# Patient Record
Sex: Female | Born: 1960 | Race: White | Hispanic: No | Marital: Married | Smoking: Current every day smoker
Health system: Southern US, Community
[De-identification: ages and names within clinical notes are randomized; demographics above are authoritative.]

## PROBLEM LIST (undated history)

## (undated) DIAGNOSIS — Z720 Tobacco use: Secondary | ICD-10-CM

## (undated) DIAGNOSIS — I2510 Atherosclerotic heart disease of native coronary artery without angina pectoris: Secondary | ICD-10-CM

## (undated) DIAGNOSIS — J45909 Unspecified asthma, uncomplicated: Secondary | ICD-10-CM

## (undated) DIAGNOSIS — F329 Major depressive disorder, single episode, unspecified: Secondary | ICD-10-CM

## (undated) DIAGNOSIS — J309 Allergic rhinitis, unspecified: Secondary | ICD-10-CM

## (undated) DIAGNOSIS — T7840XA Allergy, unspecified, initial encounter: Secondary | ICD-10-CM

## (undated) DIAGNOSIS — IMO0002 Reserved for concepts with insufficient information to code with codable children: Secondary | ICD-10-CM

## (undated) DIAGNOSIS — Z780 Asymptomatic menopausal state: Secondary | ICD-10-CM

## (undated) DIAGNOSIS — K589 Irritable bowel syndrome without diarrhea: Secondary | ICD-10-CM

## (undated) DIAGNOSIS — Z8719 Personal history of other diseases of the digestive system: Secondary | ICD-10-CM

## (undated) DIAGNOSIS — K219 Gastro-esophageal reflux disease without esophagitis: Secondary | ICD-10-CM

## (undated) DIAGNOSIS — J42 Unspecified chronic bronchitis: Secondary | ICD-10-CM

## (undated) DIAGNOSIS — F32A Depression, unspecified: Secondary | ICD-10-CM

## (undated) DIAGNOSIS — L409 Psoriasis, unspecified: Secondary | ICD-10-CM

## (undated) DIAGNOSIS — N809 Endometriosis, unspecified: Secondary | ICD-10-CM

## (undated) DIAGNOSIS — E785 Hyperlipidemia, unspecified: Secondary | ICD-10-CM

## (undated) DIAGNOSIS — F419 Anxiety disorder, unspecified: Secondary | ICD-10-CM

## (undated) DIAGNOSIS — Z8669 Personal history of other diseases of the nervous system and sense organs: Secondary | ICD-10-CM

## (undated) DIAGNOSIS — I10 Essential (primary) hypertension: Secondary | ICD-10-CM

## (undated) HISTORY — DX: Allergy, unspecified, initial encounter: T78.40XA

## (undated) HISTORY — DX: Depression, unspecified: F32.A

## (undated) HISTORY — DX: Essential (primary) hypertension: I10

## (undated) HISTORY — DX: Gastro-esophageal reflux disease without esophagitis: K21.9

## (undated) HISTORY — DX: Anxiety disorder, unspecified: F41.9

## (undated) HISTORY — DX: Personal history of other diseases of the nervous system and sense organs: Z86.69

## (undated) HISTORY — DX: Hyperlipidemia, unspecified: E78.5

## (undated) HISTORY — DX: Tobacco use: Z72.0

## (undated) HISTORY — DX: Major depressive disorder, single episode, unspecified: F32.9

## (undated) HISTORY — DX: Endometriosis, unspecified: N80.9

## (undated) HISTORY — DX: Atherosclerotic heart disease of native coronary artery without angina pectoris: I25.10

## (undated) HISTORY — DX: Allergic rhinitis, unspecified: J30.9

## (undated) HISTORY — DX: Unspecified asthma, uncomplicated: J45.909

## (undated) HISTORY — DX: Psoriasis, unspecified: L40.9

## (undated) HISTORY — DX: Irritable bowel syndrome, unspecified: K58.9

## (undated) HISTORY — DX: Reserved for concepts with insufficient information to code with codable children: IMO0002

## (undated) HISTORY — DX: Personal history of other diseases of the digestive system: Z87.19

## (undated) HISTORY — PX: DILATION AND CURETTAGE OF UTERUS: SHX78

---

## 2001-07-15 HISTORY — PX: ABDOMINAL HYSTERECTOMY: SHX81

## 2004-08-07 ENCOUNTER — Ambulatory Visit: Payer: Self-pay | Admitting: Obstetrics and Gynecology

## 2004-12-17 ENCOUNTER — Ambulatory Visit: Payer: Self-pay | Admitting: Internal Medicine

## 2005-09-05 ENCOUNTER — Ambulatory Visit: Payer: Self-pay | Admitting: Obstetrics and Gynecology

## 2007-11-19 ENCOUNTER — Ambulatory Visit: Payer: Self-pay | Admitting: Family Medicine

## 2007-12-31 ENCOUNTER — Ambulatory Visit: Payer: Self-pay | Admitting: Internal Medicine

## 2008-04-15 ENCOUNTER — Ambulatory Visit: Payer: Self-pay | Admitting: Internal Medicine

## 2009-03-05 ENCOUNTER — Emergency Department: Payer: Self-pay | Admitting: Emergency Medicine

## 2010-04-03 ENCOUNTER — Ambulatory Visit: Payer: Self-pay

## 2010-04-30 ENCOUNTER — Other Ambulatory Visit: Payer: Self-pay | Admitting: Surgery

## 2010-07-04 ENCOUNTER — Ambulatory Visit: Payer: Self-pay | Admitting: Gastroenterology

## 2010-07-10 LAB — PATHOLOGY REPORT

## 2010-08-04 LAB — HM PAP SMEAR

## 2010-12-05 LAB — PULMONARY FUNCTION TEST

## 2011-03-05 LAB — HM COLONOSCOPY: HM Colonoscopy: NORMAL

## 2011-03-10 LAB — HM COLONOSCOPY

## 2011-07-02 ENCOUNTER — Ambulatory Visit: Payer: Self-pay | Admitting: Otolaryngology

## 2012-01-03 LAB — HM MAMMOGRAPHY: HM Mammogram: NORMAL

## 2012-01-27 ENCOUNTER — Ambulatory Visit: Payer: Self-pay | Admitting: Obstetrics and Gynecology

## 2012-01-29 ENCOUNTER — Ambulatory Visit: Payer: Self-pay | Admitting: Obstetrics and Gynecology

## 2012-03-04 ENCOUNTER — Encounter: Payer: Self-pay | Admitting: Internal Medicine

## 2012-03-04 ENCOUNTER — Ambulatory Visit (INDEPENDENT_AMBULATORY_CARE_PROVIDER_SITE_OTHER): Payer: PRIVATE HEALTH INSURANCE | Admitting: Internal Medicine

## 2012-03-04 VITALS — BP 110/70 | HR 106 | Temp 98.7°F | Resp 16 | Ht 67.0 in | Wt 187.5 lb

## 2012-03-04 DIAGNOSIS — Z8719 Personal history of other diseases of the digestive system: Secondary | ICD-10-CM

## 2012-03-04 DIAGNOSIS — F3289 Other specified depressive episodes: Secondary | ICD-10-CM

## 2012-03-04 DIAGNOSIS — F32A Depression, unspecified: Secondary | ICD-10-CM

## 2012-03-04 DIAGNOSIS — Z7189 Other specified counseling: Secondary | ICD-10-CM

## 2012-03-04 DIAGNOSIS — Z720 Tobacco use: Secondary | ICD-10-CM | POA: Insufficient documentation

## 2012-03-04 DIAGNOSIS — IMO0002 Reserved for concepts with insufficient information to code with codable children: Secondary | ICD-10-CM

## 2012-03-04 DIAGNOSIS — Z716 Tobacco abuse counseling: Secondary | ICD-10-CM

## 2012-03-04 DIAGNOSIS — K589 Irritable bowel syndrome without diarrhea: Secondary | ICD-10-CM

## 2012-03-04 DIAGNOSIS — F329 Major depressive disorder, single episode, unspecified: Secondary | ICD-10-CM

## 2012-03-04 DIAGNOSIS — I10 Essential (primary) hypertension: Secondary | ICD-10-CM

## 2012-03-04 DIAGNOSIS — F17200 Nicotine dependence, unspecified, uncomplicated: Secondary | ICD-10-CM

## 2012-03-04 DIAGNOSIS — N390 Urinary tract infection, site not specified: Secondary | ICD-10-CM

## 2012-03-04 LAB — POCT URINALYSIS DIPSTICK
Bilirubin, UA: NEGATIVE
Blood, UA: NEGATIVE
Glucose, UA: NEGATIVE
Ketones, UA: NEGATIVE
Leukocytes, UA: NEGATIVE
Nitrite, UA: NEGATIVE
Protein, UA: NEGATIVE
Spec Grav, UA: 1.01
Urobilinogen, UA: 0.2
pH, UA: 5.5

## 2012-03-04 MED ORDER — ESOMEPRAZOLE MAGNESIUM 40 MG PO CPDR: 40.0000 mg | DELAYED_RELEASE_CAPSULE | Freq: Every day | ORAL | Status: DC

## 2012-03-04 NOTE — Progress Notes (Signed)
Patient ID: Colleen Washington, female   DOB: 1960-11-19, 51 y.o.   MRN: [id number]  Patient Active Problem List  Diagnosis  . Tobacco abuse counseling  . Depression  . History of diverticulitis of colon  . GERD (gastroesophageal reflux disease)  . Hyperlipidemia  . Irritable bowel syndrome  . H/O migraine  . Hypertension  . Herniated intervertebral disc    Subjective:  CC:   Chief Complaint  Patient presents with  . Establish Care    HPI:   Colleen Washington is a 51 y.o. female who presents as a new patient to establish primary care with the chief complaint of  1) diarrhea.  She has a history of IBS and anxiety .  Fluctuates between diarrhea and constipation.  Currently having a bout of diarrhea , which she describes as multiple loose stools daily. There have been no dietary changes, although she did have [employer] roast beef sandwhich last night.  Drinks [employer] daily.  Uses immodium on a regular basis if she is in close proximity to the bathroom. She  Has not tried a gluten free diet, but has noticed improvement when she diets and avoids fatty foods and.  Takes wellbutrin,  both for smoking cessation and for depression.  Prior trials of SSRIs caused loss of libido . recently resumed  Wellbutrin a month ago prescribed by Dr. Greggory Keen who resumed it for possible perimenopausal symptoms (hot flashes).   2) Back and flank pain.  She had a recent vaginal ultrasound for right sided pain, bladder pressure and low back pain .  Was treated initially for a UTI, but developed signs and symptoms consistent with sciatica.[name]  3) inability to lose weight  .  Does not exercise regularly.  Drinks SlimFast with added fruit in AM,  Lunch is banana sandwich. She craves carbohydrates;.drinks sugared sodas, averaging 2 cans daily. Her Brother is a diabetic . 3) reflux for more than 5 yrs,  Prior screening for Barretts with EGD 2012 with colonoscopy.  Her reflux is  aggravated by flavored waters.   4) elevated cholesterol,   Total is over 200 but she is not sure the breakdown. She did not tolerate statins due to bone pain.   A  Past Medical History  Diagnosis Date  . allergic rhinitis   . Depression   . History of diverticulitis of colon   . GERD (gastroesophageal reflux disease)   . Hyperlipidemia   . Irritable bowel syndrome   . H/O migraine   . Hypertension   . Herniated intervertebral disc     Past Surgical History  Procedure Date  . Abdominal hysterectomy 2003    Family History  Problem Relation Age of Onset  . Arthritis Mother   . Hyperlipidemia Mother   . Cancer Father     colon  . Arthritis Maternal Grandmother   . Hyperlipidemia Maternal Grandfather     History   Social History  . Marital Status: Married    Spouse Name: N/A    Number of Children: N/A  . Years of Education: N/A   Occupational History  . Not on file.   Social History Main Topics  . Smoking status: Current Everyday Smoker    Types: Cigarettes  . Smokeless tobacco: Never Used  . Alcohol Use: Yes  . Drug Use: No  . Sexually Active: Not on file   Other Topics Concern  . Not on file   Social History Narrative  . No narrative on file  Allergies  Allergen Reactions  . Compazine (Prochlorperazine) Swelling    Review of Systems:   The remainder of the review of systems was negative except those addressed in the HPI.    Objective:  BP 110/70  Pulse 106  Temp 98.7 F (37.1 C) (Oral)  Resp 16  Ht 5\' 7"  (1.702 m)  Wt 187 lb 8 oz (85.049 kg)  BMI 29.37 kg/m2  SpO2 95%  General appearance: alert, cooperative and appears stated age Ears: normal TM's and external ear canals both ears Throat: lips, mucosa, and tongue normal; teeth and gums normal Neck: no adenopathy, no carotid bruit, supple, symmetrical, trachea midline and thyroid not enlarged, symmetric, no tenderness/mass/nodules Back: symmetric, no curvature. ROM normal. No CVA tenderness. Lungs: clear to auscultation  bilaterally Heart: regular rate and rhythm, S1, S2 normal, no murmur, click, rub or gallop Abdomen: soft, non-tender; bowel sounds normal; no masses,  no organomegaly Pulses: 2+ and symmetric Skin: Skin color, texture, turgor normal. No rashes or lesions Lymph nodes: Cervical, supraclavicular, and axillary nodes normal.  Assessment and Plan:  Herniated intervertebral disc Recommended core strengthening exercises and regular exercise to reduce her weight. She has no radicular symptoms currently.  Hypertension She has a history of hypertension by prior PCPs records but does not appear to having any issues currently.  Irritable bowel syndrome Recommended low carbohydrate diet and possibly trial of gluten-free diet to see if her symptoms improve.  Tobacco abuse counseling She has resumed Wellbutrin as of one month ago for assistance in quitting smoking. I encouraged her to reduce her consumption by one cigarette weekly.  History of diverticulitis of colon Recommend high-fiber diet.  Depression She's currently taking Wellbutrin for multiple reasons. She has no symptoms of anhedonia today.   Updated Medication List Outpatient Encounter Prescriptions as of 03/04/2012  Medication Sig Dispense Refill  . buPROPion (WELLBUTRIN SR) 150 MG 12 hr tablet Take 150 mg by mouth daily.      [name] esomeprazole (NEXIUM) 40 MG capsule Take 1 capsule (40 mg total) by mouth daily.  30 capsule  3     Orders Placed This Encounter  Procedures  . HM MAMMOGRAPHY  . HM PAP SMEAR  . POCT Urinalysis Dipstick  . HM COLONOSCOPY    No Follow-up on file.

## 2012-03-04 NOTE — Patient Instructions (Addendum)
Consider a Low Glycemic Index Diet and eating 6 smaller meals daily .  This frequent feeding stimulates your metabolism and the lower glycemic index foods will lower your blood sugars:   This is an example of my daily  "Low GI"  Diet:  All of the foods can be found at grocery stores and in bulk at [employer]  club   7 AM Breakfast:  Low carbohydrate Protein  Shakes (I recommend the [employer] "Carb Control" shakes  Or the low carb shakes by Atkins.   Both are available everywhere:  In  cases at [employer]  Or in 4 packs at grocery stores and pharmacies  2.5 carbs  (Alternative is  a toasted Arnold's Sandwhich Thin w/ peanut butter, a "Bagel Thin" with cream cheese and salmon) or  a scrambled egg burrito made with a low carb tortilla .  Avoid cereal and bananas, oatmeal too unless the old fashioned kind that takes 30-40 minutes to prepare.  the rest is overly processed, has minimal fiber, and loaded with carbohydrates!   10 AM: Protein bar by Atkins (the snack size, under 200 cal.  There are many varieties , available widely again or in bulk in limited varieties at [employer])  Other so called "protein bars" tend to be loaded with carbohydrates.  Remember, in food advertising, the word "energy" is synonymous for " carbohydrate."  Lunch: sandwich of turkey, (or any lunchmeat or canned tuna), fresh avocado and cheese on a lower carbohydrate pita bread, flatbread, or tortilla . Ok to use mayonnaise. The bread is the only source or carbohydrate that can be decreased (Joseph's makes a pita bread and a flat bread  Are 50 cal and 4 net carbs ; Toufayan makes a low carb flatbread 100 cal and 9 net carbs  and  Mission makes a low carb whole wheat tortilla  210 cal and 6 net carbs)  3 PM:  Mid day :  Another protein bar,  Or a  cheese stick (100 cal, 0 carbs),  Or 1 ounce of  almonds, walnuts, pistachios, pecans, peanuts,  Macadamia nuts. Or a Dannon light n Fit greek yogurt, 80 cal 8 net carbs . Avoid "granola"; the dried  cranberries and raisins are loaded with carbohydrates.    6 PM  Dinner:  "mean and green:"  Meat/chicken/fish or a high protein legume; , with a green salad, and a low GI  Veggie (broccoli, cauliflower, green beans, spinach, brussel sprouts. Lima beans) : Avoid "Low fat dressings, [place] and [place]! They are loaded with sugar! Instead use ranch, vinagrette,  Blue cheese, etc  9 PM snack : Breyer's "low carb" fudgsicle or  ice cream bar (Carb Smart line), or  Weight Watcher's ice cream bar , or anouther "no sugar added" ice cream; or another protein shake or a serving of fresh fruit with whipped cream (Avoid bananas, pineapple, grapes  and watermelon on a regular basis because they are high in sugar)   Remember that snack Substitutions should be less than 15 to 20 carbs  Per serving. Remember to subtract fiber grams to get the "net carbs." 

## 2012-03-05 ENCOUNTER — Telehealth: Payer: Self-pay | Admitting: Internal Medicine

## 2012-03-05 NOTE — Telephone Encounter (Signed)
I received the request from the pharmacy for a prior auth for Nexium.  Form has been requested and will be faxed.

## 2012-03-07 ENCOUNTER — Encounter: Payer: Self-pay | Admitting: Internal Medicine

## 2012-03-07 DIAGNOSIS — Z8719 Personal history of other diseases of the digestive system: Secondary | ICD-10-CM | POA: Insufficient documentation

## 2012-03-07 DIAGNOSIS — F32A Depression, unspecified: Secondary | ICD-10-CM | POA: Insufficient documentation

## 2012-03-07 DIAGNOSIS — IMO0002 Reserved for concepts with insufficient information to code with codable children: Secondary | ICD-10-CM | POA: Insufficient documentation

## 2012-03-07 DIAGNOSIS — I10 Essential (primary) hypertension: Secondary | ICD-10-CM | POA: Insufficient documentation

## 2012-03-07 DIAGNOSIS — K2100 Gastro-esophageal reflux disease with esophagitis, without bleeding: Secondary | ICD-10-CM | POA: Insufficient documentation

## 2012-03-07 DIAGNOSIS — E785 Hyperlipidemia, unspecified: Secondary | ICD-10-CM | POA: Insufficient documentation

## 2012-03-07 DIAGNOSIS — K589 Irritable bowel syndrome without diarrhea: Secondary | ICD-10-CM | POA: Insufficient documentation

## 2012-03-07 DIAGNOSIS — Z8669 Personal history of other diseases of the nervous system and sense organs: Secondary | ICD-10-CM | POA: Insufficient documentation

## 2012-03-07 DIAGNOSIS — K219 Gastro-esophageal reflux disease without esophagitis: Secondary | ICD-10-CM | POA: Insufficient documentation

## 2012-03-07 DIAGNOSIS — F329 Major depressive disorder, single episode, unspecified: Secondary | ICD-10-CM | POA: Insufficient documentation

## 2012-03-07 NOTE — Assessment & Plan Note (Addendum)
She's currently taking Wellbutrin for multiple reasons. She has no symptoms of anhedonia today.

## 2012-03-07 NOTE — Assessment & Plan Note (Signed)
Recommended core strengthening exercises and regular exercise to reduce her weight. She has no radicular symptoms currently.

## 2012-03-07 NOTE — Assessment & Plan Note (Signed)
Recommend high fiber diet

## 2012-03-07 NOTE — Assessment & Plan Note (Signed)
She has a history of hypertension by prior PCPs records but does not appear to having any issues currently.

## 2012-03-07 NOTE — Assessment & Plan Note (Signed)
She has resumed Wellbutrin as of one month ago for assistance in quitting smoking. I encouraged her to reduce her consumption by one cigarette weekly.

## 2012-03-07 NOTE — Assessment & Plan Note (Signed)
Recommended low carbohydrate diet and possibly trial of gluten-free diet to see if her symptoms improve.

## 2012-05-06 ENCOUNTER — Ambulatory Visit (INDEPENDENT_AMBULATORY_CARE_PROVIDER_SITE_OTHER): Payer: PRIVATE HEALTH INSURANCE | Admitting: Internal Medicine

## 2012-05-06 ENCOUNTER — Encounter: Payer: Self-pay | Admitting: Internal Medicine

## 2012-05-06 VITALS — BP 118/70 | HR 95 | Temp 97.7°F | Ht 67.0 in | Wt 184.0 lb

## 2012-05-06 DIAGNOSIS — Z7189 Other specified counseling: Secondary | ICD-10-CM

## 2012-05-06 DIAGNOSIS — J42 Unspecified chronic bronchitis: Secondary | ICD-10-CM

## 2012-05-06 DIAGNOSIS — R197 Diarrhea, unspecified: Secondary | ICD-10-CM

## 2012-05-06 DIAGNOSIS — F17200 Nicotine dependence, unspecified, uncomplicated: Secondary | ICD-10-CM

## 2012-05-06 DIAGNOSIS — Z716 Tobacco abuse counseling: Secondary | ICD-10-CM

## 2012-05-06 DIAGNOSIS — R350 Frequency of micturition: Secondary | ICD-10-CM | POA: Insufficient documentation

## 2012-05-06 LAB — POCT URINALYSIS DIPSTICK
Bilirubin, UA: NEGATIVE
Blood, UA: NEGATIVE
Glucose, UA: NEGATIVE
Ketones, UA: NEGATIVE
Leukocytes, UA: NEGATIVE
Nitrite, UA: NEGATIVE
Protein, UA: NEGATIVE
Spec Grav, UA: 1.015
Urobilinogen, UA: 0.2
pH, UA: 7

## 2012-05-06 MED ORDER — ONDANSETRON HCL 4 MG PO TABS: 4.0000 mg | ORAL_TABLET | Freq: Three times a day (TID) | ORAL | Status: DC | PRN

## 2012-05-06 MED ORDER — SOLIFENACIN SUCCINATE 5 MG PO TABS: 5.0000 mg | ORAL_TABLET | Freq: Every day | ORAL | Status: DC

## 2012-05-06 NOTE — Patient Instructions (Addendum)
Diet for Diarrhea, Adult Having frequent, runny stools (diarrhea) has many causes. Diarrhea may be caused or worsened by food or drink. Diarrhea may be relieved by changing your diet. IF YOU ARE NOT TOLERATING SOLID FOODS:  Drink enough water and fluids to keep your urine clear or pale yellow.  Avoid sugary drinks and sodas as well as milk-based beverages.  Avoid beverages containing caffeine and alcohol.  You may try rehydrating beverages. You can make your own by following this recipe:   tsp table salt.   tsp baking soda.   tsp salt substitute (potassium chloride).  1 tbs + 1 tsp sugar.  1 qt water. As your stools become more solid, you can start eating solid foods. Add foods one at a time. If a certain food causes your diarrhea to get worse, avoid that food and try other foods. A low fiber, low-fat, and lactose-free diet is recommended. Small, frequent meals may be better tolerated.  Starches  Allowed:  White, French, and pita breads, plain rolls, buns, bagels. Plain muffins, matzo. Soda, saltine, or graham crackers. Pretzels, melba toast, zwieback. Cooked cereals made with water: cornmeal, farina, cream cereals. Dry cereals: refined corn, wheat, rice. Potatoes prepared any way without skins, refined macaroni, spaghetti, noodles, refined rice.  Avoid:  Bread, rolls, or crackers made with whole wheat, multi-grains, rye, bran seeds, nuts, or coconut. Corn tortillas or taco shells. Cereals containing whole grains, multi-grains, bran, coconut, nuts, or raisins. Cooked or dry oatmeal. Coarse wheat cereals, granola. Cereals advertised as "high-fiber." Potato skins. Whole grain pasta, wild or brown rice. Popcorn. Sweet potatoes/yams. Sweet rolls, doughnuts, waffles, pancakes, sweet breads. Vegetables  Allowed: Strained tomato and vegetable juices. Most well-cooked and canned vegetables without seeds. Fresh: Tender lettuce, cucumber without the skin, cabbage, spinach, bean  sprouts.  Avoid: Fresh, cooked, or canned: Artichokes, baked beans, beet greens, broccoli, Brussels sprouts, corn, kale, legumes, peas, sweet potatoes. Cooked: Green or red cabbage, spinach. Avoid large servings of any vegetables, because vegetables shrink when cooked, and they contain more fiber per serving than fresh vegetables. Fruit  Allowed: All fruit juices except prune juice. Cooked or canned: Apricots, applesauce, cantaloupe, cherries, fruit cocktail, grapefruit, grapes, kiwi, mandarin oranges, peaches, pears, plums, watermelon. Fresh: Apples without skin, ripe banana, grapes, cantaloupe, cherries, grapefruit, peaches, oranges, plums. Keep servings limited to  cup or 1 piece.  Avoid: Fresh: Apple with skin, apricots, mango, pears, raspberries, strawberries. Prune juice, stewed or dried prunes. Dried fruits, raisins, dates. Large servings of all fresh fruits. Meat and Meat Substitutes  Allowed: Ground or well-cooked tender beef, ham, veal, lamb, pork, or poultry. Eggs, plain cheese. Fish, oysters, shrimp, lobster, other seafoods. Liver, organ meats.  Avoid: Tough, fibrous meats with gristle. Peanut butter, smooth or chunky. Cheese, nuts, seeds, legumes, dried peas, beans, lentils. Milk  Allowed: Yogurt, lactose-free milk, kefir, drinkable yogurt, buttermilk, soy milk.  Avoid: Milk, chocolate milk, beverages made with milk, such as milk shakes. Soups  Allowed: Bouillon, broth, or soups made from allowed foods. Any strained soup.  Avoid: Soups made from vegetables that are not allowed, cream or milk-based soups. Desserts and Sweets  Allowed: Sugar-free gelatin, sugar-free frozen ice pops made without sugar alcohol.  Avoid: Plain cakes and cookies, pie made with allowed fruit, pudding, custard, cream pie. Gelatin, fruit, ice, sherbet, frozen ice pops. Ice cream, ice milk without nuts. Plain hard candy, honey, jelly, molasses, syrup, sugar, chocolate syrup, gumdrops,  marshmallows. Fats and Oils  Allowed: Avoid any fats and oils.  Avoid:   honey, jelly, molasses, syrup, sugar, chocolate syrup, gumdrops,  marshmallows.  Fats and Oils  Allowed: Avoid any fats and oils.   Avoid: Seeds, nuts, olives, avocados. Margarine, butter, cream, mayonnaise, salad oils, plain salad dressings made from allowed foods. Plain gravy, crisp bacon without rind.  Beverages  Allowed: Water, decaffeinated teas, oral rehydration solutions, sugar-free beverages.   Avoid: Fruit juices, caffeinated beverages (coffee, tea, soda or pop), alcohol, sports drinks, or lemon-lime soda or pop.  Condiments  Allowed: Ketchup, mustard, horseradish, vinegar, cream sauce, cheese sauce, cocoa powder. Spices in moderation: allspice, basil, bay leaves, celery powder or leaves, cinnamon, cumin powder, curry powder, ginger, mace, marjoram, onion or garlic powder, oregano, paprika, parsley flakes, ground pepper, rosemary, sage, savory, tarragon, thyme, turmeric.   Avoid: Coconut, honey.  Weight Monitoring: Weigh yourself every day. You should weigh yourself in the morning after you urinate and before you eat breakfast. Wear the same amount of clothing when you weigh yourself. Record your weight daily. Bring your recorded weights to your clinic visits. Tell your caregiver right away if you have gained 3 lb/1.4 kg or more in 1 day, 5 lb/2.3 kg in a week, or whatever amount you were told to report. SEEK IMMEDIATE MEDICAL CARE IF:    You are unable to keep fluids down.   You start to throw up (vomit) or diarrhea keeps coming back (persistent).   Abdominal pain develops, increases, or can be felt in one place (localizes).   You have an oral temperature above 102 F (38.9 C), not controlled by medicine.   Diarrhea contains blood or mucus.   You develop excessive weakness, dizziness, fainting, or extreme thirst.  MAKE SURE YOU:    Understand these instructions.   Will watch your condition.   Will get help right away if you are not doing well or get worse.  Document Released: 09/21/2003 Document Revised: 09/23/2011 Document Reviewed:  11/15/2011 [facility] Patient Information 2013 [place], [place].    For the cough, try adding Simply Saline nasal spray to flush your sinuses twice daily. To see if the post nasal drip .  iF no improvement ,  Call for an x ray

## 2012-05-06 NOTE — Assessment & Plan Note (Addendum)
4 day history,  Initially watery,  With cramping and nausea without vomiting .  stools are now starting to improve, so this is unlikely to be Clostridium difficile in the absence of continued symptoms cramping or fevers. Again she has no direct contact with patients. Clear liquid diet and agiven,  zofran rx sent to [facility]

## 2012-05-06 NOTE — Assessment & Plan Note (Addendum)
Her UA was normal.  Trial of vesicare 5  Mg.  Samples given. If no improvement in symptoms we will send her for a postvoid residual to see whether she has incomplete emptying secondary to neurogenic bladder or urethral stricture.

## 2012-05-06 NOTE — Progress Notes (Signed)
Patient ID: Colleen Washington, female   DOB: 13-Mar-1961, 51 y.o.   MRN: [id number]  Patient Active Problem List  Diagnosis  . Tobacco abuse counseling  . Depression  . History of diverticulitis of colon  . GERD (gastroesophageal reflux disease)  . Hyperlipidemia  . Irritable bowel syndrome  . H/O migraine  . Hypertension  . Herniated intervertebral disc  . Urinary frequency  . Diarrhea in adult patient  . Bronchitis, chronic    Subjective:  CC:   Chief Complaint  Patient presents with  . LUQ pain, diarrhea, nausea    x 4 days    HPI:   Colleen Rileyis a 51 y.o. female who presents  Multiple complaints.  1) 4 day history of nausea and diarrhea without vomiting,  Started Sunday (4 days ago). Symptoms were preceded by a day of feeling fatigued .  On Sunday started having pain under ribs. Now mid epigastric, not sharp. No fevers.  Myalgias of legs and shoulders   Had watery diarrhea  6 to 7 times daily for a few days, today is the first  today that there is some substance to the stool. 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 History of IBS and alternates between constipation and diarrhea.  2) Still has frequent urination of small amounts. accompanied by low back pain relieved with voiding. Symptoms are recurrent,  improved with treatment with antibiotic. Has had evaluation by Dr Sheppard Penton many years ago for urinary frequency and incomplete feeling of emptying, and was told she had an "irritable bladder" then but offered no treatment   3) cough,  Hacking, non productive, accompanied by post nasal drainage and itchy ears.   Using allegra.    Past Medical History  Diagnosis Date  . allergic rhinitis   . Depression   . History of diverticulitis of colon   . GERD (gastroesophageal reflux disease)   . Hyperlipidemia   . Irritable bowel syndrome   . H/O migraine   . Hypertension   . Herniated intervertebral disc     Past Surgical History  Procedure  Date  . Abdominal hysterectomy 2003         The following portions of the patient's history were reviewed and updated as appropriate: Allergies, current medications, and problem list.    Review of Systems:   12 Pt  review of systems was negative except those addressed in the HPI,     History   Social History  . Marital Status: Married    Spouse Name: N/A    Number of Children: N/A  . Years of Education: N/A   Occupational History  . Not on file.   Social History Main Topics  . Smoking status: Current Every Day Smoker -- 0.3 packs/day for 20 years    Types: Cigarettes  . Smokeless tobacco: Never Used  . Alcohol Use: Yes  . Drug Use: No  . Sexually Active: Not on file   Other Topics Concern  . Not on file   Social History Narrative  . No narrative on file    Objective:  BP 118/70  Pulse 95  Temp 97.7 F (36.5 C)  Ht 5\' 7"  (1.702 m)  Wt 184 lb (83.462 kg)  BMI 28.82 kg/m2  SpO2 99%  General appearance: alert, cooperative and appears stated age Ears: normal TM's and external ear canals both ears Throat: lips, mucosa, and tongue normal; teeth and gums normal Neck: no adenopathy, no carotid bruit,  supple, symmetrical, trachea midline and thyroid not enlarged, symmetric, no tenderness/mass/nodules Back: symmetric, no curvature. ROM normal. No CVA tenderness. Lungs: clear to auscultation bilaterally Heart: regular rate and rhythm, S1, S2 normal, no murmur, click, rub or gallop Abdomen: soft, non-tender; bowel sounds normal; no masses,  no organomegaly Pulses: 2+ and symmetric Skin: Skin color, texture, turgor normal. No rashes or lesions Lymph nodes: Cervical, supraclavicular, and axillary nodes normal.  Assessment and Plan:  Urinary frequency Her UA was normal.  Trial of vesicare 5  Mg.  Samples given. If no improvement in symptoms we will send her for a postvoid residual to see whether she has incomplete emptying secondary to neurogenic bladder or  urethral stricture.  Diarrhea in adult patient 4 day history,  Initially watery,  With cramping and nausea without vomiting .  stools are now starting to improve, so this is unlikely to be Clostridium difficile in the absence of continued symptoms cramping or fevers. Again she has no direct contact with patients. Clear liquid diet and agiven,  zofran rx sent to [facility]   Tobacco abuse counseling Her current upper respiratory symptoms are aggravated by her ongoing tobacco use. Reassurance provided she has chronic bronchitis superimposed with a viral URI. Counseling given.  Bronchitis, chronic Secondary to long history of tobacco abuse. Counseling given.   Updated Medication List Outpatient Encounter Prescriptions as of 05/06/2012  Medication Sig Dispense Refill  . esomeprazole (NEXIUM) 40 MG capsule Take 1 capsule (40 mg total) by mouth daily.  30 capsule  3  . fexofenadine (ALLEGRA) 180 MG tablet Take 180 mg by mouth daily.      . fluticasone (FLONASE) 50 MCG/ACT nasal spray Place 2 sprays into the nose daily.      . sertraline (ZOLOFT) 25 MG tablet Take 25 mg by mouth daily.      . ondansetron (ZOFRAN) 4 MG tablet Take 1 tablet (4 mg total) by mouth every 8 (eight) hours as needed for nausea.  30 tablet  1  . solifenacin (VESICARE) 5 MG tablet Take 1 tablet (5 mg total) by mouth daily.  14 tablet  0  . DISCONTD: buPROPion (WELLBUTRIN SR) 150 MG 12 hr tablet Take 150 mg by mouth daily.         Orders Placed This Encounter  Procedures  . Urine culture  . Urinalysis  . POCT urinalysis dipstick    No Follow-up on file.

## 2012-05-07 ENCOUNTER — Telehealth: Payer: Self-pay

## 2012-05-07 NOTE — Telephone Encounter (Signed)
sure

## 2012-05-08 ENCOUNTER — Telehealth: Payer: Self-pay

## 2012-05-08 NOTE — Telephone Encounter (Signed)
Ordered urine culture

## 2012-05-09 ENCOUNTER — Encounter: Payer: Self-pay | Admitting: Internal Medicine

## 2012-05-09 DIAGNOSIS — J42 Unspecified chronic bronchitis: Secondary | ICD-10-CM | POA: Insufficient documentation

## 2012-05-09 NOTE — Assessment & Plan Note (Signed)
Her current upper respiratory symptoms are aggravated by her ongoing tobacco use. Reassurance provided she has chronic bronchitis superimposed with a viral URI. Counseling given.

## 2012-05-09 NOTE — Assessment & Plan Note (Signed)
Secondary to long history of tobacco abuse. Counseling given.

## 2012-05-10 LAB — URINE CULTURE: Colony Count: 50000

## 2012-08-24 ENCOUNTER — Telehealth: Payer: Self-pay | Admitting: Internal Medicine

## 2012-08-24 NOTE — Telephone Encounter (Signed)
FYI

## 2012-08-24 NOTE — Telephone Encounter (Signed)
Patient Information:  Caller Name: Shirin  Phone: [phone]  Patient: Colleen Washington, Colleen Washington  Gender: Female  DOB: 03/19/1961  Age: 53 Years  PCP: Duncan Dull (Adults only)  Pregnant: No  Office Follow Up:  Does the office need to follow up with this patient?: No  Instructions For The Office: N/A  RN Note:  Patient requested to schedule an appointment on Tuesday morning instead of going to an UC this afternoon.  Symptoms  Reason For Call & Symptoms: Cell number: [phone]. Patient is having migraine headaches. Wants to schedule an appointment. She has had a CT scan done that was recommended by the allergist she sees.  Reviewed Health History In EMR: Yes  Reviewed Medications In EMR: Yes  Reviewed Allergies In EMR: Yes  Reviewed Surgeries / Procedures: Yes  Date of Onset of Symptoms: 08/15/2012  Treatments Tried: Xanax, Flexeril  Treatments Tried Worked: No OB / GYN:  LMP: Unknown  Guideline(s) Used:  Headache  Disposition Per Guideline:   See Today in Office  Reason For Disposition Reached:   Patient wants to be seen  Advice Given:  N/A  Appointment Scheduled:  08/25/2012 08:15:00 Appointment Scheduled Provider:  Duncan Dull (Adults only)

## 2012-08-25 ENCOUNTER — Ambulatory Visit (INDEPENDENT_AMBULATORY_CARE_PROVIDER_SITE_OTHER): Payer: 59 | Admitting: Internal Medicine

## 2012-08-25 ENCOUNTER — Encounter: Payer: Self-pay | Admitting: Internal Medicine

## 2012-08-25 VITALS — BP 118/82 | HR 74 | Temp 97.7°F | Resp 16 | Wt 189.2 lb

## 2012-08-25 DIAGNOSIS — J42 Unspecified chronic bronchitis: Secondary | ICD-10-CM

## 2012-08-25 DIAGNOSIS — F410 Panic disorder [episodic paroxysmal anxiety] without agoraphobia: Secondary | ICD-10-CM

## 2012-08-25 DIAGNOSIS — Z716 Tobacco abuse counseling: Secondary | ICD-10-CM

## 2012-08-25 DIAGNOSIS — F3289 Other specified depressive episodes: Secondary | ICD-10-CM

## 2012-08-25 DIAGNOSIS — G43909 Migraine, unspecified, not intractable, without status migrainosus: Secondary | ICD-10-CM

## 2012-08-25 DIAGNOSIS — F32A Depression, unspecified: Secondary | ICD-10-CM

## 2012-08-25 DIAGNOSIS — Z7189 Other specified counseling: Secondary | ICD-10-CM

## 2012-08-25 DIAGNOSIS — F329 Major depressive disorder, single episode, unspecified: Secondary | ICD-10-CM

## 2012-08-25 DIAGNOSIS — F17200 Nicotine dependence, unspecified, uncomplicated: Secondary | ICD-10-CM

## 2012-08-25 MED ORDER — ONDANSETRON HCL 4 MG PO TABS: 4.0000 mg | ORAL_TABLET | Freq: Three times a day (TID) | ORAL | Status: DC | PRN

## 2012-08-25 MED ORDER — PROPRANOLOL HCL 10 MG PO TABS: ORAL_TABLET | ORAL | Status: DC

## 2012-08-25 NOTE — Patient Instructions (Addendum)
Take a celebrex(anti inflammatory)  and an amrix (muscle relaxer , timed release flexeril)  for your headache .  If you have no relief after one hour, take one relpax.  zofran refill sent to [facility]   We try propranolol for your "stage fright" it can be taken 15 to 20 minutes before an anticipated event thatn might make you nervous

## 2012-08-25 NOTE — Progress Notes (Signed)
Patient ID: Colleen Washington, female   DOB: July 07, 1961, 52 y.o.   MRN: [id number]  Patient Active Problem List  Diagnosis  . History of tobacco abuse  . Depression  . History of diverticulitis of colon  . GERD (gastroesophageal reflux disease)  . Hyperlipidemia  . Irritable bowel syndrome  . H/O migraine  . Hypertension  . Herniated intervertebral disc  . Bronchitis, chronic  . Migraine, unspecified, without mention of intractable migraine without mention of status migrainosus  . 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 headache    3 days, behind eyes    HPI:   Colleen Rileyis a 52 y.o. female who presents with persistent headache x 3 days.   onset of headache was moderate to severe. She teated it with NSAIDs and flonase without change .  Took 2 dramamine for the nausea and an excedrine migraine pill yesterday which allowed her to sleep.  Was treated for a  sinus infection a month ago with azithromycin which gave her diarrhea. Denies visual changes,  Neck rigidity,  Vertigo, feveras and chills but has been having daily  hot flashes for the last 6 months    Colleen Washington resumed her wellbutrin a few months ago with good resutls.  Shw quit smoking and using the vapor cigs .  She did not tolerate paxil fue to recurrent chest pain and zoloft made her constpated   Past Medical History  Diagnosis Date  . allergic rhinitis   . Depression   . History of diverticulitis of colon   . GERD (gastroesophageal reflux disease)   . Hyperlipidemia   . Irritable bowel syndrome   . H/O migraine   . Hypertension   . Herniated intervertebral disc     Past Surgical History  Procedure Laterality Date  . Abdominal hysterectomy  2003       The following portions of the patient's history were reviewed and updated as appropriate: Allergies, current medications, and problem list.    Review of Systems:   Patient  denies  fevers, malaise, unintentional weight loss, skin rash, eye pain, sinus congestion and sinus pain, sore throat, dysphagia,  hemoptysis , cough, dyspnea, wheezing, chest pain, palpitations, orthopnea, edema, abdominal pain, melena, diarrhea, constipation, flank pain, dysuria, hematuria, urinary  Frequency, nocturia, numbness, tingling, seizures,  Focal weakness, Loss of consciousness,  Tremor, insomnia, depression,, and suicidal ideation.         History   Social History  . Marital Status: Married    Spouse Name: N/A    Number of Children: N/A  . Years of Education: N/A   Occupational History  . Not on file.   Social History Main Topics  . Smoking status: Former Smoker -- 0.30 packs/day for 20 years    Types: Cigarettes    Quit date: 05/26/2012  . Smokeless tobacco: Never Used  . Alcohol Use: Yes  . Drug Use: No  . Sexually Active: Not on file   Other Topics Concern  . Not on file   Social History Narrative  . No narrative on file    Objective:  BP 118/82  Pulse 74  Temp(Src) 97.7 F (36.5 C) (Oral)  Resp 16  Wt 189 lb 4 oz (85.843 kg)  BMI 29.63 kg/m2  SpO2 97%  General appearance: alert, cooperative and appears stated age Ears: normal TM's and external ear canals both ears Throat: lips, mucosa, and tongue normal; teeth  and gums normal Neck: no adenopathy, no carotid bruit, no nuchal rigidity , cervical spine tenderness/mass/nodules Back: symmetric, no curvature. ROM normal. No CVA tenderness. Lungs: clear to auscultation bilaterally Heart: regular rate and rhythm, S1, S2 normal, no murmur, click, rub or gallop Abdomen: soft, non-tender; bowel sounds normal; no masses,  no organomegaly Pulses: 2+ and symmetric Skin: Skin color, texture, turgor normal. No rashes or lesions Lymph nodes: Cervical, supraclavicular, and axillary nodes normal. Neuro: CN 2-12 intact , grossly nonfocal.   Assessment and Plan:  Migraine, unspecified, without mention of  intractable migraine without mention of status migrainosus Quality of the patient's headache is similar to migraines she used to experience when she was having periods. Samples of Relpax, Amrix and Celebrex given to patient. Prescription for Zofran since her pharmacy for the concurrent nausea Patient instructed to try Celebrex and M. Rex and if no relief in one hour to take one dose of Relpax.Amrix  History of tobacco abuse Patient quit smoking several months ago with the help of Wellbutrin prescribed by Dr. Greggory Washington. Encouragement given.  Depression With previous intolerances to Zoloft and Prozac. Symptoms are now controlled with Wellbutrin. No changes today.  Bronchitis, chronic Symptoms are currently quiescent since she quit smoking.  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 She wants to avoid increase to daily medication. Trial of propranolol for those occasions.   Updated Medication List Outpatient Encounter Prescriptions as of 08/25/2012  Medication Sig Dispense Refill  . buPROPion (WELLBUTRIN XL) 150 MG 24 hr tablet Take 150 mg by mouth daily.      [name] esomeprazole (NEXIUM) 40 MG capsule Take 1 capsule (40 mg total) by mouth daily.  30 capsule  3  . fexofenadine (ALLEGRA) 180 MG tablet Take 180 mg by mouth daily.      . fluticasone (FLONASE) 50 MCG/ACT nasal spray Place 2 sprays into the nose daily.      . ondansetron (ZOFRAN) 4 MG tablet Take 1 tablet (4 mg total) by mouth every 8 (eight) hours as needed for nausea.  30 tablet  1  . propranolol (INDERAL) 10 MG tablet 30 minutes prior to event as needed for stage fright  30 tablet  1  . solifenacin (VESICARE) 5 MG tablet Take 1 tablet (5 mg total) by mouth daily.  14 tablet  0  . [DISCONTINUED] ondansetron (ZOFRAN) 4 MG tablet Take 1 tablet (4 mg total) by mouth every 8 (eight) hours as needed for nausea.  30 tablet  1  . [DISCONTINUED]  sertraline (ZOLOFT) 25 MG tablet Take 25 mg by mouth daily.       No facility-administered encounter medications on file as of 08/25/2012.     No orders of the defined types were placed in this encounter.    No Follow-up on file.

## 2012-08-26 ENCOUNTER — Encounter: Payer: Self-pay | Admitting: Internal Medicine

## 2012-08-26 DIAGNOSIS — G43909 Migraine, unspecified, not intractable, without status migrainosus: Secondary | ICD-10-CM | POA: Insufficient documentation

## 2012-08-26 DIAGNOSIS — F410 Panic disorder [episodic paroxysmal anxiety] without agoraphobia: Secondary | ICD-10-CM | POA: Insufficient documentation

## 2012-08-26 NOTE — Assessment & Plan Note (Signed)
She has episodes of social anxiety accompanied by tachycardia and avoidance. She wants to avoid increase to daily medication. Trial of propranolol for those occasions.

## 2012-08-26 NOTE — Assessment & Plan Note (Signed)
With previous intolerances to Zoloft and Prozac. Symptoms are now controlled with Wellbutrin. No changes today.

## 2012-08-26 NOTE — Assessment & Plan Note (Addendum)
Quality of the patient's headache is similar to migraines she used to experience when she was having periods. Samples of Relpax, Amrix and Celebrex given to patient. Prescription for Zofran since her pharmacy for the concurrent nausea Patient instructed to try Celebrex and M. Rex and if no relief in one hour to take one dose of Relpax.Amrix

## 2012-08-26 NOTE — Assessment & Plan Note (Signed)
Symptoms are currently quiescent since she quit smoking.

## 2012-08-26 NOTE — Assessment & Plan Note (Addendum)
Patient quit smoking several months ago with the help of Wellbutrin prescribed by Dr. Greggory Keen. Encouragement given.

## 2012-08-30 ENCOUNTER — Other Ambulatory Visit: Payer: Self-pay

## 2012-09-16 ENCOUNTER — Encounter: Payer: Self-pay | Admitting: Internal Medicine

## 2012-09-16 ENCOUNTER — Telehealth: Payer: Self-pay | Admitting: Internal Medicine

## 2012-09-16 MED ORDER — CELECOXIB 200 MG PO CAPS: 200.0000 mg | ORAL_CAPSULE | Freq: Two times a day (BID) | ORAL | Status: DC

## 2012-09-16 MED ORDER — CYCLOBENZAPRINE HCL 10 MG PO TABS: 10.0000 mg | ORAL_TABLET | Freq: Three times a day (TID) | ORAL | Status: DC | PRN

## 2012-09-16 NOTE — Telephone Encounter (Signed)
Pt notified that these meds were filled and also about Mychart use.

## 2012-09-16 NOTE — Telephone Encounter (Signed)
Patient Information:  Caller Name: Iniya  Phone: [phone]  Patient: Colleen Washington, Colleen Washington  Gender: Female  DOB: 1960/08/25  Age: 52 Years  PCP: Duncan Dull (Adults only)  Pregnant: No  Office Follow Up:  Does the office need to follow up with this patient?: Yes  Instructions For The Office: OFFICE PT HAS ALREADY SENT AN EMAIL TO MD THIS AM REGARDING THIS ISSUE.  HAVING MIGRAINE HEADACHE AND WOULD LIKE FLEXERIL AND CELEBREX CALLED IN.  PHARMACY INFORMATION IS IN CHART.  PLEASE CALL PT BACK AT [phone] TO LET HER KNOW IF MD IS GOING TO DO THIS.  Thanks.  RN Note:  Pt requesting scripts be called in for Celebrex and Flexeril.  Symptoms  Reason For Call & Symptoms: Pt calling today regarding having migraine headache that started yesterday.  She is requesting medication be called in.  She has already sent e-mail to provider this AM requesting meds be called in also.  No response to e-mail noted in computer.  Reviewed Health History In EMR: Yes  Reviewed Medications In EMR: Yes  Reviewed Allergies In EMR: Yes  Reviewed Surgeries / Procedures: Yes  Date of Onset of Symptoms: 09/15/2012  Treatments Tried: Zofran which helped with nausea.  Took Excedrin Mirgraine yesterday AM but did not help  Treatments Tried Worked: No OB / GYN:  LMP: Unknown  Guideline(s) Used:  Headache  Disposition Per Guideline:   Callback by PCP or Subspecialist within 1 Hour  Reason For Disposition Reached:   Severe headache and has had severe headaches before  Advice Given:  N/A

## 2012-09-16 NOTE — Telephone Encounter (Signed)
refilled Celebrex and Flexeril. Please remind patient that my chart is for nonurgent messages ; if she needs a response within 12 hours she needs to call the office

## 2012-11-15 ENCOUNTER — Encounter: Payer: Self-pay | Admitting: Internal Medicine

## 2012-11-16 MED ORDER — BUPROPION HCL ER (XL) 150 MG PO TB24: 150.0000 mg | ORAL_TABLET | Freq: Every day | ORAL | Status: DC

## 2012-11-16 NOTE — Telephone Encounter (Signed)
Rx sent to pharmacy by escript  

## 2013-02-19 ENCOUNTER — Encounter: Payer: Self-pay | Admitting: Internal Medicine

## 2013-03-11 LAB — HM PAP SMEAR: HM Pap smear: NEGATIVE

## 2013-03-23 LAB — HM PAP SMEAR

## 2013-05-05 ENCOUNTER — Ambulatory Visit (INDEPENDENT_AMBULATORY_CARE_PROVIDER_SITE_OTHER): Payer: 59 | Admitting: Internal Medicine

## 2013-05-05 ENCOUNTER — Encounter: Payer: Self-pay | Admitting: Internal Medicine

## 2013-05-05 ENCOUNTER — Ambulatory Visit: Payer: Self-pay | Admitting: Internal Medicine

## 2013-05-05 VITALS — BP 104/78 | HR 96 | Temp 97.8°F | Resp 14 | Wt 192.5 lb

## 2013-05-05 DIAGNOSIS — M79661 Pain in right lower leg: Secondary | ICD-10-CM

## 2013-05-05 DIAGNOSIS — M79609 Pain in unspecified limb: Secondary | ICD-10-CM

## 2013-05-05 DIAGNOSIS — M79662 Pain in left lower leg: Secondary | ICD-10-CM

## 2013-05-05 DIAGNOSIS — M5442 Lumbago with sciatica, left side: Secondary | ICD-10-CM

## 2013-05-05 DIAGNOSIS — Z87891 Personal history of nicotine dependence: Secondary | ICD-10-CM

## 2013-05-05 DIAGNOSIS — M5430 Sciatica, unspecified side: Secondary | ICD-10-CM

## 2013-05-05 MED ORDER — NICOTINE 14 MG/24HR TD PT24: 1.0000 | MEDICATED_PATCH | TRANSDERMAL | Status: DC

## 2013-05-05 MED ORDER — PREDNISONE (PAK) 10 MG PO TABS: ORAL_TABLET | ORAL | Status: DC

## 2013-05-05 MED ORDER — HYDROCODONE-ACETAMINOPHEN 5-325 MG PO TABS: 1.0000 | ORAL_TABLET | Freq: Four times a day (QID) | ORAL | Status: DC | PRN

## 2013-05-05 NOTE — Assessment & Plan Note (Signed)
Still smoking, now on estradiol started by Dr Greggory Keen,.  Strongly urged to quit ,  requesting nicoderm patch

## 2013-05-05 NOTE — Progress Notes (Signed)
Patient ID: Colleen Washington, female   DOB: April 10, 1961, 52 y.o.   MRN: [id number]   Patient Active Problem List   Diagnosis Date Noted  . Pain of left calf 05/06/2013  . Acute back pain on left side with sciatica 05/06/2013  . Migraine, unspecified, without mention of intractable migraine without mention of status migrainosus 08/26/2012  . 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 Bronchitis, chronic 05/09/2012  . Depression   . History of diverticulitis of colon   . GERD (gastroesophageal reflux disease)   . Hyperlipidemia   . Irritable bowel syndrome   . H/O migraine   . Hypertension   . Herniated intervertebral disc   . History of tobacco abuse 03/04/2012    Subjective:  CC:   Chief Complaint  Patient presents with  . Back Pain    X 1 week    HPI:   Colleen Rileyis a 52 y.o. female who presents with Back pain radiating to left calf for the past week,  Tried aleve ,  Other OTC meds .calf feels tight and throbbing.  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  Most comfortable position is sitting with ice on back  Lying [place] uncomfortable,  Peeing a lot, no burnign.  Has a remote history of injury to lower back with leg radiation . Prior x rays of lumbar spine 4 or 5 yrs ago for same.   Her gynecologist put he ron estradiol despite her continued tobacco abuse!      Past Medical History  Diagnosis Date  . allergic rhinitis   . Depression   . History of diverticulitis of colon   . GERD (gastroesophageal reflux disease)   . Hyperlipidemia   . Irritable bowel syndrome   . H/O migraine   . Hypertension   . Herniated intervertebral disc     Past Surgical History  Procedure Laterality Date  . Abdominal hysterectomy  2003       The following portions of the patient's history were reviewed and updated as appropriate: Allergies, current medications, and problem list.    Review of Systems:   12 Pt  review of systems was  negative except those addressed in the HPI,     History   Social History  . Marital Status: Married    Spouse Name: N/A    Number of Children: N/A  . Years of Education: N/A   Occupational History  . Not on file.   Social History Main Topics  . Smoking status: Current Every Day Smoker -- 0.30 packs/day for 20 years    Types: Cigarettes  . Smokeless tobacco: Never Used  . Alcohol Use: Yes  . Drug Use: No  . Sexual Activity: Not on file   Other Topics Concern  . Not on file   Social History Narrative  . No narrative on file    Objective:  Filed Vitals:   05/05/13 1642  BP: 104/78  Pulse: 96  Temp: 97.8 F (36.6 C)  Resp: 14     General appearance: alert, cooperative and appears stated age Ears: normal TM's and external ear canals both ears Throat: lips, mucosa, and tongue normal; teeth and gums normal Neck: no adenopathy, no carotid bruit, supple, symmetrical, trachea midline and thyroid not enlarged, symmetric, no tenderness/mass/nodules Back: symmetric, no curvature. ROM normal. No CVA tenderness. Lungs: clear to auscultation bilaterally Heart: regular rate and rhythm, S1, S2 normal, no murmur, click, rub or gallop Abdomen: soft, non-tender; bowel  sounds normal; no masses,  no organomegaly Pulses: 2+ and symmetric Skin: Skin color, texture, turgor normal. No rashes or lesions Lymph nodes: Cervical, supraclavicular, and axillary nodes normal.  Assessment and Plan:  History of tobacco abuse Still smoking, now on estradiol started by Dr Greggory Keen,.  Strongly urged to quit ,  requesting nicoderm patch   Pain of left calf DVT ruled out given history of tobacco abuse and concurrent use of HRT  Acute back pain on left side with sciatica Prednisone taper, MR and vicodin.  Return in 2 weeks    Updated Medication List Outpatient Encounter Prescriptions as of 05/05/2013  Medication Sig Dispense Refill  . buPROPion (WELLBUTRIN XL) 150 MG 24 hr tablet Take 1  tablet (150 mg total) by mouth daily.  30 tablet  2  . cyclobenzaprine (FLEXERIL) 10 MG tablet Take 1 tablet (10 mg total) by mouth 3 (three) times daily as needed for muscle spasms.  30 tablet  2  . estradiol (ESTRACE) 1 MG tablet Take 0.5 mg by mouth daily.      . fexofenadine (ALLEGRA) 180 MG tablet Take 180 mg by mouth daily.      . fluticasone (FLONASE) 50 MCG/ACT nasal spray Place 2 sprays into the nose daily.      . ondansetron (ZOFRAN) 4 MG tablet Take 1 tablet (4 mg total) by mouth every 8 (eight) hours as needed for nausea.  30 tablet  1  . propranolol (INDERAL) 10 MG tablet 30 minutes prior to event as needed for stage fright  30 tablet  1  . esomeprazole (NEXIUM) 40 MG capsule Take 1 capsule (40 mg total) by mouth daily.  30 capsule  3  . HYDROcodone-acetaminophen (NORCO/VICODIN) 5-325 MG per tablet Take 1 tablet by mouth every 6 (six) hours as needed for pain.  120 tablet  0  . nicotine (NICODERM CQ) 14 mg/24hr patch Place 1 patch onto the skin daily.  28 patch  0  . predniSONE (STERAPRED UNI-PAK) 10 MG tablet 6 tablets on Day 1 , then reduce by 1 tablet daily until gone  21 tablet  0  . solifenacin (VESICARE) 5 MG tablet Take 1 tablet (5 mg total) by mouth daily.  14 tablet  0  . [DISCONTINUED] celecoxib (CELEBREX) 200 MG capsule Take 1 capsule (200 mg total) by mouth 2 (two) times daily. As needed for migraine headache  30 capsule  2   No facility-administered encounter medications on file as of 05/05/2013.     Orders Placed This Encounter  Procedures  . Lower Extremity Venous Duplex Left    No Follow-up on file.

## 2013-05-06 DIAGNOSIS — M79662 Pain in left lower leg: Secondary | ICD-10-CM | POA: Insufficient documentation

## 2013-05-06 DIAGNOSIS — M5442 Lumbago with sciatica, left side: Secondary | ICD-10-CM | POA: Insufficient documentation

## 2013-05-06 NOTE — Assessment & Plan Note (Signed)
DVT ruled out given history of tobacco abuse and concurrent use of HRT

## 2013-05-06 NOTE — Assessment & Plan Note (Addendum)
Prednisone taper, MR and vicodin.  Return in 2 weeks

## 2013-05-25 ENCOUNTER — Encounter: Payer: Self-pay | Admitting: Internal Medicine

## 2013-06-23 ENCOUNTER — Other Ambulatory Visit: Payer: Self-pay | Admitting: *Deleted

## 2013-06-23 MED ORDER — ONDANSETRON HCL 4 MG PO TABS: 4.0000 mg | ORAL_TABLET | Freq: Three times a day (TID) | ORAL | Status: DC | PRN

## 2013-06-23 MED ORDER — PROPRANOLOL HCL 10 MG PO TABS: ORAL_TABLET | ORAL | Status: DC

## 2013-06-23 NOTE — Telephone Encounter (Signed)
Ok refill? 

## 2013-08-09 ENCOUNTER — Telehealth: Payer: Self-pay | Admitting: Internal Medicine

## 2013-08-09 NOTE — Telephone Encounter (Signed)
Patient called in to make an appointment she would like to have her lab work done prior to her appointment on 08/23/13. She would like to get the lab work done at the hospital. She states she has been feeling tired.

## 2013-08-10 NOTE — Telephone Encounter (Signed)
Left message for pt to call back  °

## 2013-08-10 NOTE — Telephone Encounter (Signed)
cpx is scheduled on 08/23/13, please advise if you want any additional labs drawn

## 2013-08-10 NOTE — Telephone Encounter (Signed)
Please put an [facility] lab sheet in my red folder with her name on it . I will fill out tonight she can pick it up tomorrow to take with her.

## 2013-08-13 ENCOUNTER — Encounter: Payer: Self-pay | Admitting: Internal Medicine

## 2013-08-13 DIAGNOSIS — Z0283 Encounter for blood-alcohol and blood-drug test: Secondary | ICD-10-CM | POA: Insufficient documentation

## 2013-08-13 NOTE — Telephone Encounter (Signed)
Left message on voicemail to call office. Need to know if pt picked up lab order.

## 2013-08-19 NOTE — Telephone Encounter (Signed)
Left message for patient to call office.  

## 2013-08-23 ENCOUNTER — Ambulatory Visit (INDEPENDENT_AMBULATORY_CARE_PROVIDER_SITE_OTHER): Payer: 59 | Admitting: Internal Medicine

## 2013-08-23 ENCOUNTER — Encounter: Payer: Self-pay | Admitting: Internal Medicine

## 2013-08-23 VITALS — BP 120/88 | HR 91 | Temp 98.0°F | Resp 18 | Ht 67.0 in | Wt 189.2 lb

## 2013-08-23 DIAGNOSIS — I10 Essential (primary) hypertension: Secondary | ICD-10-CM

## 2013-08-23 DIAGNOSIS — G43909 Migraine, unspecified, not intractable, without status migrainosus: Secondary | ICD-10-CM

## 2013-08-23 DIAGNOSIS — N951 Menopausal and female climacteric states: Secondary | ICD-10-CM

## 2013-08-23 DIAGNOSIS — N390 Urinary tract infection, site not specified: Secondary | ICD-10-CM

## 2013-08-23 DIAGNOSIS — Z0000 Encounter for general adult medical examination without abnormal findings: Secondary | ICD-10-CM

## 2013-08-23 DIAGNOSIS — F17200 Nicotine dependence, unspecified, uncomplicated: Secondary | ICD-10-CM

## 2013-08-23 DIAGNOSIS — Z720 Tobacco use: Secondary | ICD-10-CM

## 2013-08-23 DIAGNOSIS — R0609 Other forms of dyspnea: Secondary | ICD-10-CM

## 2013-08-23 DIAGNOSIS — R0683 Snoring: Secondary | ICD-10-CM

## 2013-08-23 DIAGNOSIS — E663 Overweight: Secondary | ICD-10-CM

## 2013-08-23 DIAGNOSIS — R0989 Other specified symptoms and signs involving the circulatory and respiratory systems: Secondary | ICD-10-CM

## 2013-08-23 MED ORDER — CLONIDINE HCL 0.1 MG PO TABS: 0.1000 mg | ORAL_TABLET | Freq: Two times a day (BID) | ORAL | Status: DC

## 2013-08-23 MED ORDER — ESOMEPRAZOLE MAGNESIUM 40 MG PO CPDR: 40.0000 mg | DELAYED_RELEASE_CAPSULE | Freq: Every day | ORAL | Status: DC

## 2013-08-23 NOTE — Patient Instructions (Addendum)
You had your annual  wellness exam today  You may be due for a  TDaP vaccine (tetanus) Please check with Employee health   Please get fasting labs at [facility] when convenient.    We will contact you with the bloodwork results  Regarding the hot flashes and blood pressure:  trial of clonidine 0.1 mg once or twice daily ( for hot flashes and bo control)    Regarding the snoring:   Check the vent beside your bed to make sure you are not needing them to be cleaned.   Use the flonase religiously to see if the problem is allergic rhinitis.   Regarding the diet and weight loss attempts:  Pick either low carb or low calorie diet:  These foods can be on both diets  Dannon lt n fit greek yogurt Mini sweet peppers  Toufayan makes a flatbread tortilla for roll up sandwiches Atkins and EAS protein shakes Hard boiled egg whites    Take the "confetti"  [place] salad    This is  One version of a  "Low GI"  Diet:  It will still lower your blood sugars and allow you to lose 4 to 8  lbs  per month if you follow it carefully.  Your goal with exercise is a minimum of 30 minutes of aerobic exercise 5 days per week (Walking does not count once it becomes easy!)    All of the foods can be found at grocery stores and in bulk at [employer].  The Atkins protein bars and shakes are available in more varieties at [employer], [employer] and [place].     7 AM Breakfast:  Choose from the following:  Low carbohydrate Protein  Shakes (I recommend the EAS AdvantEdge "Carb Control" shakes  Or the low carb shakes by Atkins.    2.5 carbs   Arnold's "Sandwhich Thin"toasted  w/ peanut butter (no jelly: about 20 net carbs  "Bagel Thin" with cream cheese and salmon: about 20 carbs   a scrambled egg/bacon/cheese burrito made with [place]'s "carb balance" whole wheat tortilla  (about 10 net carbs )   Avoid cereal and bananas, oatmeal and cream of wheat and grits. They are loaded with carbohydrates!   10 AM:  high protein snack  Protein bar by Atkins (the snack size, under 200 cal, usually < 6 net carbs).    A stick of cheese:  Around 1 carb,  100 cal     Dannon Light n Fit [place] Yogurt  (80 cal, 8 carbs)  Other so called "protein bars" and Greek yogurts tend to be loaded with carbohydrates.  Remember, in food advertising, the word "energy" is synonymous for " carbohydrate."  Lunch:   A Sandwich using the bread choices listed, Can use any  Eggs,  lunchmeat, grilled meat or canned tuna), avocado, regular mayo/mustard  and cheese.  A Salad using blue cheese, ranch,  Goddess or vinagrette,  No croutons or "confetti" and no "candied nuts" but regular nuts OK.   No pretzels or chips.  Pickles and miniature sweet peppers are a good low carb alternative that provide a "crunch"  The bread is the only source of carbohydrate in a sandwich and  can be decreased by trying some of these alternatives to traditional loaf bread  Joseph's makes a pita bread and a flat bread that are 50 cal and 4 net carbs available at [place] and [place].  This can be toasted to use with hummous as  well  Toufayan makes a low carb flatbread that's 100 cal and 9 net carbs available at [employer] and [employer]'s makes 2 sizes of  Low carb whole wheat tortilla  (The large one is 210 cal and 6 net carbs) Avoid "Low fat dressings, as well as Barry Brunner and [place] dressings They are loaded with sugar!   3 PM/ Mid day  Snack:  Consider  1 ounce of  almonds, walnuts, pistachios, pecans, peanuts,  Macadamia nuts or a nut medley.  Avoid "granola"; the dried cranberries and raisins are loaded with carbohydrates. Mixed nuts as long as there are no raisins,  cranberries or dried fruit.     6 PM  Dinner:     Meat/fowl/fish with a green salad, and either broccoli, cauliflower, green beans, spinach, brussel sprouts or  Lima beans. DO NOT BREAD THE PROTEIN!!      There is a low carb pasta by Dreamfield's that is acceptable and tastes great:  only 5 digestible carbs/serving.( All grocery stores but BJs carry it )  Try Hurley Cisco Angelo's chicken piccata or chicken or eggplant parm over low carb pasta.(Lowes and BJs)   Marjory Lies Sanchez's "Carnitas" (pulled pork, no sauce,  0 carbs) or his beef pot roast to make a dinner burrito (at [employer]J's)  Pesto over low carb pasta (bj's sells a good quality pesto in the center refrigerated section of the deli   Whole wheat pasta is still full of digestible carbs and  Not as low in glycemic index as Dreamfield's.   Brown rice is still rice,  So skip the rice and noodles if you eat [place] or [place] (or at least limit to 1/2 cup)  9 PM snack :   Breyer's "low carb" fudgsicle or  ice cream bar (Carb Smart line), or  Weight Watcher's ice cream bar , or another "no sugar added" ice cream;  a serving of fresh berries/cherries with whipped cream   Cheese or DANNON'S LlGHT N FIT GREEK YOGURT  Avoid bananas, pineapple, grapes  and watermelon on a regular basis because they are high in sugar.  THINK OF THEM AS DESSERT  Remember that snack Substitutions should be less than 10 NET carbs per serving and meals < 20 carbs. Remember to subtract fiber grams to get the "net carbs."  ALTERNATIVELY,  THE LOW CARB DIET Will prevent low blood sugars and low energy levels

## 2013-08-23 NOTE — Assessment & Plan Note (Signed)
Mostly tension sounding, Managed with MR and phenergan

## 2013-08-23 NOTE — Progress Notes (Signed)
Patient ID: Colleen Washington, female   DOB: 12-21-1960, 53 y.o.   MRN: [id number]  Subjective:     Colleen Washington is a 53 y.o. female and is here for a comprehensive physical exam. The patient reports multiple issues to discuss.     Annual GYN was done recently and mammogram ordered by GYN.   She has been having recurrent annoying Hot flashes for the past several months,  several daily.  S/p total  hysterectomy.   Making her irritable  Works at the hospital.   Tobacco abuse.  She continues to smoke but is trying to quit.  Overweight.  She has been frustrated by inability to lose weight despite starting an exercise program and dieting.  Diet and exercise reviewed in detail today. She avoids junk food and sweets,  But diet is not following a low cal or a low carb pattern.      History   Social History  . Marital Status: Married    Spouse Name: N/A    Number of Children: N/A  . Years of Education: N/A   Occupational History  . Not on file.   Social History Main Topics  . Smoking status: Current Every Day Smoker -- 0.30 packs/day for 20 years    Types: Cigarettes  . Smokeless tobacco: Never Used  . Alcohol Use: Yes  . Drug Use: No  . Sexual Activity: Not on file   Other Topics Concern  . Not on file   Social History Narrative  . No narrative on file   Health Maintenance  Topic Date Due  . Tetanus/tdap  04/06/1981  . Mammogram  04/06/2012  . Influenza Vaccine  02/12/2014  . Pap Smear  03/23/2016  . Colonoscopy  03/04/2021    The following portions of the patient's history were reviewed and updated as appropriate: allergies, current medications, past family history, past medical history, past social history, past surgical history and problem list.  Review of Systems A comprehensive review of systems was negative.   Objective:   BP 120/88  Pulse 91  Temp(Src) 98 F (36.7 C) (Oral)  Resp 18  Ht 5\' 7"  (1.702 m)  Wt 189 lb 4 oz (85.843 kg)  BMI 29.63 kg/m2  SpO2  98%  General Appearance:    Alert, cooperative, no distress, appears stated age  Head:    Normocephalic, without obvious abnormality, atraumatic  Eyes:    PERRL, conjunctiva/corneas clear, EOM's intact, fundi    benign, both eyes  Ears:    Normal TM's and external ear canals, both ears  Nose:   Nares normal, septum midline, mucosa normal, no drainage    or sinus tenderness  Throat:   Lips, mucosa, and tongue normal; teeth and gums normal  Neck:   Supple, symmetrical, trachea midline, no adenopathy;    thyroid:  no enlargement/tenderness/nodules; no carotid   bruit or JVD  Back:     Symmetric, no curvature, ROM normal, no CVA tenderness  Lungs:     Clear to auscultation bilaterally, respirations unlabored  Chest Wall:    No tenderness or deformity   Heart:    Regular rate and rhythm, S1 and S2 normal, no murmur, rub   or gallop  Breast Exam:    No tenderness, masses, or nipple abnormality  Abdomen:     Soft, non-tender, bowel sounds active all four quadrants,    no masses, no organomegaly     Extremities:   Extremities normal, atraumatic, no cyanosis or edema  Pulses:  2+ and symmetric all extremities  Skin:   Skin color, texture, turgor normal, no rashes or lesions  Lymph nodes:   Cervical, supraclavicular, and axillary nodes normal  Neurologic:   CNII-XII intact, normal strength, sensation and reflexes    throughout    Assessment and Plan:    Migraine, unspecified, without mention of intractable migraine without mention of status migrainosus Mostly tension sounding, Managed with MR and phenergan   Visit for preventive health examination Annual comprehensive exam was done including breast, excluding pelvic and PAP smear. All screenings have been addressed .   Overweight (BMI 25.0-29.9) I have encouraged her to continue an exercise and dietreduction of   BMI and encouragedgoal of 10% of body weigh over the next 6 months using a low glycemic index diet and regular exercise a  minimum of 5 days per week.    Hypertension Elevated today on recheck with diastolic elevations noted.  Given concurrent menopausal symptoms,  Trial of clonidine 0.1 mg daily at bedtime.   Tobacco abuse Still smoking, but no longer on   Strongly urged to quit ,  Resume use of nicoderm patch     Snoring Noted by husband,  No apneic patterns reported,  deneis daytime hypersomnolence, but has overweight and hypertension.  Sleep study offered but declined at this time.   Symptoms, such as flushing, sleeplessness, headache, lack of concentration, associated with the menopause HRT not an option given ongoing tobacco abuse. Clonidine prescribed at bedtime given concurrent uncontrolled hypertension   A total of 44 minutes was spent with patient more than half of which was spent in counseling, reviewing records from other prviders and coordination of care.  Updated Medication List Outpatient Encounter Prescriptions as of 08/23/2013  Medication Sig  . buPROPion (WELLBUTRIN XL) 150 MG 24 hr tablet Take 1 tablet (150 mg total) by mouth daily.  . cyclobenzaprine (FLEXERIL) 10 MG tablet Take 1 tablet (10 mg total) by mouth 3 (three) times daily as needed for muscle spasms.  [name] estradiol (ESTRACE) 1 MG tablet Take 0.5 mg by mouth daily.  . fexofenadine (ALLEGRA) 180 MG tablet Take 180 mg by mouth daily.  . fluticasone (FLONASE) 50 MCG/ACT nasal spray Place 2 sprays into the nose daily.  [name] HYDROcodone-acetaminophen (NORCO/VICODIN) 5-325 MG per tablet Take 1 tablet by mouth every 6 (six) hours as needed for pain.  . nicotine (NICODERM CQ) 14 mg/24hr patch Place 1 patch onto the skin daily.  . ondansetron (ZOFRAN) 4 MG tablet Take 1 tablet (4 mg total) by mouth every 8 (eight) hours as needed for nausea.  . propranolol (INDERAL) 10 MG tablet 30 minutes prior to event as needed for stage fright  . cloNIDine (CATAPRES) 0.1 MG tablet Take 1 tablet (0.1 mg total) by mouth 2 (two) times daily.  [name] esomeprazole  (NEXIUM) 40 MG capsule Take 1 capsule (40 mg total) by mouth daily.  . [DISCONTINUED] esomeprazole (NEXIUM) 40 MG capsule Take 1 capsule (40 mg total) by mouth daily.  . [DISCONTINUED] predniSONE (STERAPRED UNI-PAK) 10 MG tablet 6 tablets on Day 1 , then reduce by 1 tablet daily until gone  . [DISCONTINUED] solifenacin (VESICARE) 5 MG tablet Take 1 tablet (5 mg total) by mouth daily.

## 2013-08-23 NOTE — Progress Notes (Signed)
Pre-visit discussion using our clinic review tool. No additional management support is needed unless otherwise documented below in the visit note.  

## 2013-08-24 ENCOUNTER — Telehealth: Payer: Self-pay | Admitting: Internal Medicine

## 2013-08-24 DIAGNOSIS — E669 Obesity, unspecified: Secondary | ICD-10-CM | POA: Insufficient documentation

## 2013-08-24 DIAGNOSIS — R0683 Snoring: Secondary | ICD-10-CM | POA: Insufficient documentation

## 2013-08-24 DIAGNOSIS — Z0000 Encounter for general adult medical examination without abnormal findings: Secondary | ICD-10-CM | POA: Insufficient documentation

## 2013-08-24 DIAGNOSIS — N951 Menopausal and female climacteric states: Secondary | ICD-10-CM | POA: Insufficient documentation

## 2013-08-24 NOTE — Assessment & Plan Note (Signed)
HRT not an option given ongoing tobacco abuse. Clonidine prescribed at bedtime given concurrent uncontrolled hypertension

## 2013-08-24 NOTE — Assessment & Plan Note (Signed)
I have encouraged her to continue an exercise and dietreduction of   BMI and encouragedgoal of 10% of body weigh over the next 6 months using a low glycemic index diet and regular exercise a minimum of 5 days per week.

## 2013-08-24 NOTE — Assessment & Plan Note (Signed)
Annual comprehensive exam was done including breast, excluding pelvic and PAP smear. All screenings have been addressed .  

## 2013-08-24 NOTE — Assessment & Plan Note (Addendum)
Elevated today on recheck with diastolic elevations noted.  Given concurrent menopausal symptoms,  Trial of clonidine 0.1 mg daily at bedtime.

## 2013-08-24 NOTE — Assessment & Plan Note (Addendum)
Noted by husband,  No apneic patterns reported,  deneis daytime hypersomnolence, but has overweight and hypertension.  Sleep study offered but declined at this time.

## 2013-08-24 NOTE — Assessment & Plan Note (Signed)
Still smoking, but no longer on   Strongly urged to quit ,  Resume use of nicoderm patch

## 2013-08-24 NOTE — Telephone Encounter (Signed)
Relevant patient education assigned to patient using Emmi. ° °

## 2013-09-21 ENCOUNTER — Encounter: Payer: Self-pay | Admitting: Internal Medicine

## 2013-09-21 MED ORDER — BUSPIRONE HCL 5 MG PO TABS: 5.0000 mg | ORAL_TABLET | Freq: Three times a day (TID) | ORAL | Status: DC

## 2013-09-21 NOTE — Telephone Encounter (Signed)
How terrible!   I have sent an rx for buspar to the [place],  Start with 5 mg twice or three times daily .  We can increase the dose after two weeks if needed.  Let me know if it is necessary/

## 2013-10-10 ENCOUNTER — Other Ambulatory Visit: Payer: Self-pay | Admitting: Internal Medicine

## 2013-10-10 ENCOUNTER — Encounter: Payer: Self-pay | Admitting: Internal Medicine

## 2013-10-11 NOTE — Telephone Encounter (Signed)
Last visit 08/23/13, ok refill?

## 2013-10-11 NOTE — Telephone Encounter (Signed)
Pt sent mychart message for flexeril refill, ok?

## 2013-10-12 MED ORDER — HYDROCODONE-ACETAMINOPHEN 5-325 MG PO TABS: 1.0000 | ORAL_TABLET | Freq: Four times a day (QID) | ORAL | Status: DC | PRN

## 2013-10-12 MED ORDER — CYCLOBENZAPRINE HCL 10 MG PO TABS: 10.0000 mg | ORAL_TABLET | Freq: Three times a day (TID) | ORAL | Status: DC | PRN

## 2013-10-12 NOTE — Telephone Encounter (Signed)
Ok to refill,  Authorized in epic 

## 2013-10-13 NOTE — Telephone Encounter (Signed)
Placed script up front for pick up and patient notified,

## 2013-11-10 ENCOUNTER — Encounter: Payer: Self-pay | Admitting: Internal Medicine

## 2013-11-15 ENCOUNTER — Other Ambulatory Visit (INDEPENDENT_AMBULATORY_CARE_PROVIDER_SITE_OTHER): Payer: 59

## 2013-11-15 ENCOUNTER — Telehealth: Payer: Self-pay | Admitting: *Deleted

## 2013-11-15 ENCOUNTER — Other Ambulatory Visit: Payer: Self-pay | Admitting: Internal Medicine

## 2013-11-15 DIAGNOSIS — M549 Dorsalgia, unspecified: Secondary | ICD-10-CM

## 2013-11-15 DIAGNOSIS — R5383 Other fatigue: Secondary | ICD-10-CM

## 2013-11-15 DIAGNOSIS — R5381 Other malaise: Secondary | ICD-10-CM

## 2013-11-15 LAB — COMPREHENSIVE METABOLIC PANEL
ALT: 17 U/L (ref 0–35)
AST: 19 U/L (ref 0–37)
Albumin: 4 g/dL (ref 3.5–5.2)
Alkaline Phosphatase: 58 U/L (ref 39–117)
BUN: 12 mg/dL (ref 6–23)
CO2: 24 mEq/L (ref 19–32)
Calcium: 9.3 mg/dL (ref 8.4–10.5)
Chloride: 107 mEq/L (ref 96–112)
Creatinine, Ser: 0.7 mg/dL (ref 0.4–1.2)
GFR: 92.02 mL/min (ref >60.00)
Glucose, Bld: 97 mg/dL (ref 70–99)
Potassium: 4 mEq/L (ref 3.5–5.1)
Sodium: 138 mEq/L (ref 135–145)
Total Bilirubin: 0.6 mg/dL (ref 0.3–1.2)
Total Protein: 7.2 g/dL (ref 6.0–8.3)

## 2013-11-15 LAB — CBC WITH DIFFERENTIAL/PLATELET
Basophils Absolute: 0 10*3/uL (ref 0.0–0.1)
Basophils Relative: 0.3 % (ref 0.0–3.0)
Eosinophils Absolute: 0.4 10*3/uL (ref 0.0–0.7)
Eosinophils Relative: 4.5 % (ref 0.0–5.0)
HCT: 41.7 % (ref 36.0–46.0)
Hemoglobin: 14.1 g/dL (ref 12.0–15.0)
Lymphocytes Relative: 26.1 % (ref 12.0–46.0)
Lymphs Abs: 2.1 10*3/uL (ref 0.7–4.0)
MCHC: 33.9 g/dL (ref 30.0–36.0)
MCV: 88.1 fl (ref 78.0–100.0)
Monocytes Absolute: 0.5 10*3/uL (ref 0.1–1.0)
Monocytes Relative: 6.9 % (ref 3.0–12.0)
Neutro Abs: 4.9 10*3/uL (ref 1.4–7.7)
Neutrophils Relative %: 62.2 % (ref 43.0–77.0)
Platelets: 202 10*3/uL (ref 150.0–400.0)
RBC: 4.73 Mil/uL (ref 3.87–5.11)
RDW: 13.2 % (ref 11.5–14.6)
WBC: 7.9 10*3/uL (ref 4.5–10.5)

## 2013-11-15 LAB — URINALYSIS, MICROSCOPIC ONLY: RBC / HPF: NONE SEEN (ref 0–?)

## 2013-11-15 NOTE — Telephone Encounter (Signed)
What labs and dx?  

## 2013-11-15 NOTE — Telephone Encounter (Signed)
Thanks,

## 2013-11-16 ENCOUNTER — Encounter: Payer: Self-pay | Admitting: Internal Medicine

## 2013-11-17 ENCOUNTER — Ambulatory Visit (INDEPENDENT_AMBULATORY_CARE_PROVIDER_SITE_OTHER): Payer: 59 | Admitting: Adult Health

## 2013-11-17 ENCOUNTER — Encounter: Payer: Self-pay | Admitting: Internal Medicine

## 2013-11-17 ENCOUNTER — Encounter: Payer: Self-pay | Admitting: Adult Health

## 2013-11-17 VITALS — BP 128/85 | HR 90 | Temp 97.9°F | Resp 14 | Wt 190.2 lb

## 2013-11-17 DIAGNOSIS — M5430 Sciatica, unspecified side: Secondary | ICD-10-CM

## 2013-11-17 DIAGNOSIS — M5442 Lumbago with sciatica, left side: Secondary | ICD-10-CM

## 2013-11-17 LAB — URINE CULTURE: Colony Count: 50000

## 2013-11-17 MED ORDER — PREDNISONE 10 MG PO TABS: ORAL_TABLET | ORAL | Status: DC

## 2013-11-17 NOTE — Progress Notes (Signed)
Subjective:    Patient ID: Colleen Washington, female    DOB: [birth year], 53 y.o.   MRN: [id number]  HPI  Pt presents with low back pain with sciatic nerve pain down left buttocks and leg. Rating pain 4/10. Reports frequent flare up of symptoms. She used to see a Restaurant manager, fast food but has not done so in a very long time. Sitting worsens her symptoms. She has taken some pain medication but pain that radiates down her leg not improved. No loss of bladder/bowel.  Current Outpatient Prescriptions on File Prior to Visit  Medication Sig Dispense Refill  . buPROPion (WELLBUTRIN XL) 150 MG 24 hr tablet Take 1 tablet (150 mg total) by mouth daily.  30 tablet  2  . busPIRone (BUSPAR) 5 MG tablet Take 1 tablet (5 mg total) by mouth 3 (three) times daily.  90 tablet  2  . cloNIDine (CATAPRES) 0.1 MG tablet Take 1 tablet (0.1 mg total) by mouth 2 (two) times daily.  60 tablet  1  . cyclobenzaprine (FLEXERIL) 10 MG tablet Take 1 tablet (10 mg total) by mouth 3 (three) times daily as needed for muscle spasms.  30 tablet  2  . esomeprazole (NEXIUM) 40 MG capsule Take 1 capsule (40 mg total) by mouth daily.  90 capsule  3  . estradiol (ESTRACE) 1 MG tablet Take 0.5 mg by mouth daily.      . fluticasone (FLONASE) 50 MCG/ACT nasal spray Place 2 sprays into the nose daily.      [name] HYDROcodone-acetaminophen (NORCO/VICODIN) 5-325 MG per tablet Take 1 tablet by mouth every 6 (six) hours as needed.  120 tablet  0  . nicotine (NICODERM CQ) 14 mg/24hr patch Place 1 patch onto the skin daily.  28 patch  0  . ondansetron (ZOFRAN) 4 MG tablet Take 1 tablet (4 mg total) by mouth every 8 (eight) hours as needed for nausea.  30 tablet  1   No current facility-administered medications on file prior to visit.     Review of Systems  Constitutional: Negative.   HENT: Negative.   Eyes: Negative.   Respiratory: Negative.   Cardiovascular: Negative.   Gastrointestinal: Negative.   Endocrine: Negative.   Genitourinary: Negative.     Musculoskeletal: Positive for back pain (sciatica left leg).  Skin: Negative.   Allergic/Immunologic: Negative.   Neurological: Negative.   Hematological: Negative.   Psychiatric/Behavioral: Negative.        Objective:   Physical Exam  Constitutional: She is oriented to person, place, and time. No distress.  HENT:  Head: Normocephalic and atraumatic.  Eyes: Conjunctivae and EOM are normal.  Neck: Normal range of motion. Neck supple.  Cardiovascular: Normal rate and regular rhythm.   Pulmonary/Chest: Effort normal. No respiratory distress.  Musculoskeletal: Normal range of motion.  No deficits noted in ROM today  Neurological: She is alert and oriented to person, place, and time. She has normal reflexes. Coordination normal.  Skin: Skin is warm and dry.  Psychiatric: She has a normal mood and affect. Her behavior is normal. Judgment and thought content normal.      Assessment & Plan:   1. Acute left-sided back pain with sciatica Discussed stretches to release piriformis muscle. Needs to do this multiple times throughout the day. Apply ice to area of tenderness. Prednisone taper. Advised not to take any NSAIDs while doing the taper. Chiropractor may benefit for low back pain with sciatica. Prescription for massage therapy. RTC if no improvement within 2 weeks or sooner  if necessary.

## 2013-11-17 NOTE — Progress Notes (Signed)
Pre visit review using our clinic review tool, if applicable. No additional management support is needed unless otherwise documented below in the visit note. 

## 2013-11-17 NOTE — Patient Instructions (Signed)
  Start prednisone taper as follows:  Day #1 - take 6 tablets Day #2 - take 5 tablets Day #3 - take 4 tablets Day #4 - take 3 tablets Day #5 - take 2 tablets Day #6 - take 1 tablet  While taking the prednisone do not take any ibuprofen, aleve or similar medication.  Massage therapy for sciatica

## 2013-11-24 NOTE — Telephone Encounter (Signed)
Mailed unread message to pt  

## 2013-11-30 ENCOUNTER — Ambulatory Visit: Payer: Self-pay | Admitting: Adult Health

## 2013-12-13 ENCOUNTER — Ambulatory Visit: Payer: Self-pay | Admitting: Adult Health

## 2013-12-30 ENCOUNTER — Telehealth: Payer: Self-pay | Admitting: *Deleted

## 2013-12-30 NOTE — Telephone Encounter (Signed)
Pt called states since started taking the Clonidine on 6.2.15 pt states she does not feel well.  Pt states her BP this morning is 105/73, HR 80.  Please advise

## 2013-12-30 NOTE — Telephone Encounter (Signed)
Spoke with pt advised of MDs message 

## 2013-12-30 NOTE — Telephone Encounter (Signed)
It was prescribed in February for treatment of hot flashes and hypertension. Have her stop it and follow up with me  In the next month for bp recheck

## 2014-01-12 ENCOUNTER — Ambulatory Visit: Payer: Self-pay | Admitting: Adult Health

## 2014-02-22 ENCOUNTER — Telehealth: Payer: Self-pay | Admitting: Internal Medicine

## 2014-02-23 ENCOUNTER — Other Ambulatory Visit: Payer: Self-pay | Admitting: Internal Medicine

## 2014-02-23 MED ORDER — HYDROCODONE-ACETAMINOPHEN 5-325 MG PO TABS: 1.0000 | ORAL_TABLET | Freq: Four times a day (QID) | ORAL | Status: DC | PRN

## 2014-02-23 NOTE — Telephone Encounter (Signed)
10 days refill only,

## 2014-03-02 MED ORDER — HYDROCODONE-ACETAMINOPHEN 5-325 MG PO TABS: 1.0000 | ORAL_TABLET | Freq: Four times a day (QID) | ORAL | Status: DC | PRN

## 2014-03-02 NOTE — Telephone Encounter (Signed)
Ok to refill the vicodin but I would loiek to see the patient before her next refill on it

## 2014-03-09 ENCOUNTER — Encounter: Payer: Self-pay | Admitting: Internal Medicine

## 2014-03-09 ENCOUNTER — Ambulatory Visit (INDEPENDENT_AMBULATORY_CARE_PROVIDER_SITE_OTHER): Payer: 59 | Admitting: Internal Medicine

## 2014-03-09 ENCOUNTER — Ambulatory Visit (INDEPENDENT_AMBULATORY_CARE_PROVIDER_SITE_OTHER)
Admission: RE | Admit: 2014-03-09 | Discharge: 2014-03-09 | Disposition: A | Discharge status: Home / Self Care | Payer: 59 | Source: Ambulatory Visit | Attending: Internal Medicine | Admitting: Internal Medicine

## 2014-03-09 VITALS — BP 110/80 | HR 82 | Temp 97.8°F | Resp 16 | Ht 67.0 in | Wt 189.5 lb

## 2014-03-09 DIAGNOSIS — R350 Frequency of micturition: Secondary | ICD-10-CM

## 2014-03-09 DIAGNOSIS — M545 Low back pain, unspecified: Secondary | ICD-10-CM

## 2014-03-09 DIAGNOSIS — R3589 Other polyuria: Secondary | ICD-10-CM

## 2014-03-09 DIAGNOSIS — R358 Other polyuria: Secondary | ICD-10-CM

## 2014-03-09 DIAGNOSIS — M5126 Other intervertebral disc displacement, lumbar region: Secondary | ICD-10-CM

## 2014-03-09 DIAGNOSIS — M5127 Other intervertebral disc displacement, lumbosacral region: Secondary | ICD-10-CM

## 2014-03-09 LAB — POCT URINALYSIS DIPSTICK
Bilirubin, UA: NEGATIVE
Blood, UA: NEGATIVE
Glucose, UA: NEGATIVE
Leukocytes, UA: NEGATIVE
Nitrite, UA: NEGATIVE
Protein, UA: NEGATIVE
Spec Grav, UA: 1.015
Urobilinogen, UA: 1
pH, UA: 7.5

## 2014-03-09 LAB — URINALYSIS, ROUTINE W REFLEX MICROSCOPIC
Bilirubin Urine: NEGATIVE
Hgb urine dipstick: NEGATIVE
Ketones, ur: NEGATIVE
Leukocytes, UA: NEGATIVE
Nitrite: NEGATIVE
Specific Gravity, Urine: 1.01 (ref 1.000–1.030)
Total Protein, Urine: NEGATIVE
Urine Glucose: NEGATIVE
Urobilinogen, UA: 1 (ref 0.0–1.0)
pH: 7.5 (ref 5.0–8.0)

## 2014-03-09 NOTE — Progress Notes (Signed)
Patient ID: GLORISTINE TURRUBIATES, female   DOB: 1960-11-19, 53 y.o.   MRN: [id number]  Patient Active Problem List   Diagnosis Date Noted  . Back pain 03/12/2014  . Overweight (BMI 25.0-29.9) 08/24/2013  . Visit for preventive health examination 08/24/2013  . Snoring 08/24/2013  . Symptoms, such as flushing, sleeplessness, headache, lack of concentration, associated with the menopause 08/24/2013  . Encounter for drug screening 08/13/2013  . Acute left-sided back pain with sciatica 05/06/2013  . Migraine, unspecified, without mention of intractable migraine without mention of status migrainosus 08/26/2012  . 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 Bronchitis, chronic 05/09/2012  . Depression   . History of diverticulitis of colon   . GERD (gastroesophageal reflux disease)   . Hyperlipidemia   . Irritable bowel syndrome   . H/O migraine   . Hypertension   . Herniated intervertebral disc   . Tobacco abuse 03/04/2012    Subjective:  CC:   Chief Complaint  Patient presents with  . Back Pain    polyuria    HPI:   Colleen Washington is a 53 y.o. female who presents for Follow up on left sided back pain first treated with prednisone and vicodin in feb  by RR .    Intermittent,  Aching Relieved only with vicodin. Sometimes feel like a muscle spasm  Told she had a pinched nerve by chiropractor  bc pain radiates donw the left leg.  Chiropractic manipualtion has helped decreased severity of pain but still present .  Gets sick with NSAIDs due to gastritis.  Has tried tramadol in the past with not relief.  Using 1/2 to 1 vicodin prn , averaging less than once daily   .  Aggravated by a full bladder, relieved with emptying of bladder. Has periods of no pain .  Has been present for over a year,  Was treated for a UTI by [name].    History of a fall backwards off the poorch as an adolescent at age 89, legs got jerked .    Past Medical History  Diagnosis Date  .  allergic rhinitis   . Depression   . History of diverticulitis of colon   . GERD (gastroesophageal reflux disease)   . Hyperlipidemia   . Irritable bowel syndrome   . H/O migraine   . Hypertension   . Herniated intervertebral disc     Past Surgical History  Procedure Laterality Date  . Abdominal hysterectomy  2003       The following portions of the patient's history were reviewed and updated as appropriate: Allergies, current medications, and problem list.    Review of Systems:   Patient denies headache, fevers, malaise, unintentional weight loss, skin rash, eye pain, sinus congestion and sinus pain, sore throat, dysphagia,  hemoptysis , cough, dyspnea, wheezing, chest pain, palpitations, orthopnea, edema, abdominal pain, nausea, melena, diarrhea, constipation, flank pain, dysuria, hematuria, urinary  Frequency, nocturia, numbness, tingling, seizures,  Focal weakness, Loss of consciousness,  Tremor, insomnia, depression, anxiety, and suicidal ideation.     History   Social History  . Marital Status: Married    Spouse Name: N/A    Number of Children: N/A  . Years of Education: N/A   Occupational History  . Not on file.   Social History Main Topics  . Smoking status: Current Every Day Smoker -- 0.30 packs/day for 20 years    Types: Cigarettes  . Smokeless tobacco: Never Used  . Alcohol Use:  Yes  . Drug Use: No  . Sexual Activity: Not Currently   Other Topics Concern  . Not on file   Social History Narrative  . No narrative on file    Objective:  Filed Vitals:   03/09/14 1359  BP: 110/80  Pulse: 82  Temp: 97.8 F (36.6 C)  Resp: 16     General appearance: alert, cooperative and appears stated age Ears: normal TM's and external ear canals both ears Throat: lips, mucosa, and tongue normal; teeth and gums normal Neck: no adenopathy, no carotid bruit, supple, symmetrical, trachea midline and thyroid not enlarged, symmetric, no  tenderness/mass/nodules Back: symmetric, no curvature. ROM normal. No CVA tenderness. Lungs: clear to auscultation bilaterally Heart: regular rate and rhythm, S1, S2 normal, no murmur, click, rub or gallop Abdomen: soft, non-tender; bowel sounds normal; no masses,  no organomegaly Pulses: 2+ and symmetric Skin: Skin color, texture, turgor normal. No rashes or lesions Lymph nodes: Cervical, supraclavicular, and axillary nodes normal.  Assessment and Plan:  Herniated intervertebral disc Her plain films reveral mild disk space narrowing at L4-:5 .  Otherwise  Normal .. NSAIDs, MR and pain relievers.   Back pain No UTI by today's evaluation. Plain films negative for fractures .  Mild loss of disk height at L4 suggesting herniated disk. [name]     Updated Medication List Outpatient Encounter Prescriptions as of 03/09/2014  Medication Sig  . buPROPion (WELLBUTRIN XL) 150 MG 24 hr tablet Take 1 tablet (150 mg total) by mouth daily.  . busPIRone (BUSPAR) 5 MG tablet Take 1 tablet (5 mg total) by mouth 3 (three) times daily.  . cetirizine (ZYRTEC) 10 MG tablet Take 10 mg by mouth daily.  . cyclobenzaprine (FLEXERIL) 10 MG tablet Take 1 tablet (10 mg total) by mouth 3 (three) times daily as needed for muscle spasms.  [name] esomeprazole (NEXIUM) 40 MG capsule Take 1 capsule (40 mg total) by mouth daily.  . fluticasone (FLONASE) 50 MCG/ACT nasal spray Place 2 sprays into the nose daily.  [name] HYDROcodone-acetaminophen (NORCO/VICODIN) 5-325 MG per tablet Take 1 tablet by mouth every 6 (six) hours as needed.  [name] HYDROcodone-acetaminophen (NORCO/VICODIN) 5-325 MG per tablet Take 1 tablet by mouth every 6 (six) hours as needed.  . nicotine (NICODERM CQ) 14 mg/24hr patch Place 1 patch onto the skin daily.  . ondansetron (ZOFRAN) 4 MG tablet Take 1 tablet (4 mg total) by mouth every 8 (eight) hours as needed fo r nausea.  . [DISCONTINUED] cloNIDine (CATAPRES) 0.1 MG tablet Take 1 tablet (0.1 mg total) by mouth 2  (two) times daily.  . [DISCONTINUED] estradiol (ESTRACE) 1 MG tablet Take 0.5 mg by mouth daily.  . [DISCONTINUED] predniSONE (DELTASONE) 10 MG tablet Take 60 mg (6 tablets) on the first day then decrease by 10 mg (1 tablet) daily until done.     Orders Placed This Encounter  Procedures  . Urine Culture  . DG Lumbar Spine Complete  . DG Sacrum/Coccyx  . Urinalysis, Routine w reflex microscopic  . POCT Urinalysis Dipstick    No Follow-up on file.

## 2014-03-09 NOTE — Progress Notes (Signed)
Pre-visit discussion using our clinic review tool. No additional management support is needed unless otherwise documented below in the visit note.  

## 2014-03-10 LAB — URINE CULTURE: Colony Count: 70000

## 2014-03-12 ENCOUNTER — Encounter: Payer: Self-pay | Admitting: Internal Medicine

## 2014-03-12 DIAGNOSIS — M549 Dorsalgia, unspecified: Secondary | ICD-10-CM | POA: Insufficient documentation

## 2014-03-12 NOTE — Assessment & Plan Note (Signed)
Her plain films reveral mild disk space narrowing at L4-:5 .  Otherwise  Normal .. NSAIDs, MR and pain relievers.

## 2014-03-12 NOTE — Assessment & Plan Note (Signed)
No UTI by today's evaluation. Plain films negative for fractures .  Mild loss of disk height at L4 suggesting herniated disk. [name]

## 2014-03-24 ENCOUNTER — Telehealth: Payer: Self-pay | Admitting: *Deleted

## 2014-03-24 MED ORDER — BENZONATATE 200 MG PO CAPS: 200.0000 mg | ORAL_CAPSULE | Freq: Three times a day (TID) | ORAL | Status: DC | PRN

## 2014-03-24 NOTE — Telephone Encounter (Signed)
Pt called states she was seen by her employee clinic diagnosed with Respiratory infection however they do not prescribe cough medication.  She is requesting a cough medication be prescribed.  Please advise

## 2014-03-24 NOTE — Telephone Encounter (Signed)
Tessalon perles sent to [facility]

## 2014-03-25 NOTE — Telephone Encounter (Signed)
Pt.notified

## 2014-04-18 ENCOUNTER — Encounter: Payer: Self-pay | Admitting: Internal Medicine

## 2014-04-18 ENCOUNTER — Ambulatory Visit (INDEPENDENT_AMBULATORY_CARE_PROVIDER_SITE_OTHER): Payer: 59 | Admitting: Internal Medicine

## 2014-04-18 VITALS — BP 118/78 | HR 94 | Temp 97.6°F | Resp 14 | Ht 67.0 in | Wt 191.0 lb

## 2014-04-18 DIAGNOSIS — E669 Obesity, unspecified: Secondary | ICD-10-CM

## 2014-04-18 DIAGNOSIS — R0683 Snoring: Secondary | ICD-10-CM

## 2014-04-18 DIAGNOSIS — E663 Overweight: Secondary | ICD-10-CM

## 2014-04-18 DIAGNOSIS — R5383 Other fatigue: Secondary | ICD-10-CM

## 2014-04-18 DIAGNOSIS — J410 Simple chronic bronchitis: Secondary | ICD-10-CM

## 2014-04-18 MED ORDER — FLUTICASONE-SALMETEROL 100-50 MCG/DOSE IN AEPB: 1.0000 | INHALATION_SPRAY | Freq: Two times a day (BID) | RESPIRATORY_TRACT | Status: DC

## 2014-04-18 MED ORDER — BENZONATATE 200 MG PO CAPS: 200.0000 mg | ORAL_CAPSULE | Freq: Three times a day (TID) | ORAL | Status: DC | PRN

## 2014-04-18 NOTE — Patient Instructions (Signed)
I am prescribing Advair inhaler to use twice daily and benzonatate cough capsules (tow or three times daily ) for cough   RINSE YOUR MOUTH AFTER EACH ADVAIR USE TO AVOID GETTING THRUSH   I am scheduling you for a sleep study to evaluate you for sleep apnea

## 2014-04-18 NOTE — Progress Notes (Signed)
Patient ID: Colleen Washington, female   DOB: 1960/09/02, 53 y.o.   MRN: [id number]  Patient Active Problem List   Diagnosis Date Noted  . Back pain 03/12/2014  . Overweight (BMI 25.0-29.9) 08/24/2013  . Visit for preventive health examination 08/24/2013  . Snoring 08/24/2013  . Symptoms, such as flushing, sleeplessness, headache, lack of concentration, associated with the menopause 08/24/2013  . Encounter for drug screening 08/13/2013  . Acute left-sided back pain with sciatica 05/06/2013  . Migraine, unspecified, without mention of intractable migraine without mention of status migrainosus 08/26/2012  . 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 Bronchitis, chronic 05/09/2012  . Depression   . History of diverticulitis of colon   . GERD (gastroesophageal reflux disease)   . Hyperlipidemia   . Irritable bowel syndrome   . H/O migraine   . Hypertension   . Herniated intervertebral disc   . Tobacco abuse 03/04/2012    Subjective:  CC:   Chief Complaint  Patient presents with  . Follow-up    fatigue and still coughing, had URI X 2 weeks ago put on an ABX and prednisone.  . Cough    Dry nonproductive.    HPI:   Colleen Washington is a 53 y.o. female who presents for  malaise and fatigue.  She was treated by walk in clinic Sept 6 with amoxicillin for "allergies"  ,  [place] and coughed the entire time,  Saw [employer] around Sept 21 and given prednisone and tussionex ,  Prednisone 6 day taper,  Felt no different,  Coughing has not stopped,  despite using using inhaler, flonase and zurtec  "I don't feel myself"  Feels tired,  Tight in chest,  Coughing,   used Vicks. Vapo rub.   Snoring  A lot ..wakes up fatigued,  Lasts all day.    Smoking a pack a week  Trying to quit.  nicoderm patch made her heart race    Past Medical History  Diagnosis Date  . allergic rhinitis   . Depression   . History of diverticulitis of colon   .  GERD (gastroesophageal reflux disease)   . Hyperlipidemia   . Irritable bowel syndrome   . H/O migraine   . Hypertension   . Herniated intervertebral disc     Past Surgical History  Procedure Laterality Date  . Abdominal hysterectomy  2003       The following portions of the patient's history were reviewed and updated as appropriate: Allergies, current medications, and problem list.    Review of Systems:   Patient denies headache, fevers, malaise, unintentional weight loss, skin rash, eye pain, sinus congestion and sinus pain, sore throat, dysphagia,  hemoptysis , cough, dyspnea, wheezing, chest pain, palpitations, orthopnea, edema, abdominal pain, nausea, melena, diarrhea, constipation, flank pain, dysuria, hematuria, urinary  Frequency, nocturia, numbness, tingling, seizures,  Focal weakness, Loss of consciousness,  Tremor, insomnia, depression, anxiety, and suicidal ideation.     History   Social History  . Marital Status: Married    Spouse Name: N/A    Number of Children: N/A  . Years of Education: N/A   Occupational History  . Not on file.   Social History Main Topics  . Smoking status: Current Every Day Smoker -- 0.30 packs/day for 20 years    Types: Cigarettes  . Smokeless tobacco: Never Used  . Alcohol Use: Yes  . Drug Use: No  . Sexual Activity: Not  Currently   Other Topics Concern  . Not on file   Social History Narrative  . No narrative on file    Objective:  Filed Vitals:   04/18/14 1639  BP: 118/78  Pulse: 94  Temp: 97.6 F (36.4 C)  Resp: 14     General appearance: alert, cooperative and appears stated age Ears: normal TM's and external ear canals both ears Throat: lips, mucosa, and tongue normal; teeth and gums normal Neck: no adenopathy, no carotid bruit, supple, symmetrical, trachea midline and thyroid not enlarged, symmetric, no tenderness/mass/nodules Back: symmetric, no curvature. ROM normal. No CVA tenderness. Lungs: clear to  auscultation bilaterally Heart: regular rate and rhythm, S1, S2 normal, no murmur, click, rub or gallop Abdomen: soft, non-tender; bowel sounds normal; no masses,  no organomegaly Pulses: 2+ and symmetric Skin: Skin color, texture, turgor normal. No rashes or lesions Lymph nodes: Cervical, supraclavicular, and axillary nodes normal.  Assessment and Plan:  Bronchitis, chronic Persistent cough is due to recent viral illness and continued tobacco abuse.  Advair added.   Snoring Needs to have OSA ruled out given  Concurrent faitgue/malaise and overweight.  Sleep study advised    Updated Medication List Outpatient Encounter Prescriptions as of 04/18/2014  Medication Sig  . buPROPion (WELLBUTRIN XL) 150 MG 24 hr tablet Take 1 tablet (150 mg total) by mouth daily.  . busPIRone (BUSPAR) 5 MG tablet Take 1 tablet (5 mg total) by mouth 3 (three) times daily.  . cetirizine (ZYRTEC) 10 MG tablet Take 10 mg by mouth daily.  . chlorpheniramine-HYDROcodone (TUSSIONEX) 10-8 MG/5ML LQCR Take 5 mLs by mouth at bedtime as needed for cough.  . cyclobenzaprine (FLEXERIL) 10 MG tablet Take 1 tablet (10 mg total) by mouth 3 (three) times daily as needed for muscle spasms.  [name] esomeprazole (NEXIUM) 40 MG capsule Take 1 capsule (40 mg total) by mouth daily.  . fluticasone (FLONASE) 50 MCG/ACT nasal spray Place 2 sprays into the nose daily.  . nicotine (NICODERM CQ) 14 mg/24hr patch Place 1 patch onto the skin daily.  . ondansetron (ZOFRAN) 4 MG tablet Take 1 tablet (4 mg total) by mouth every 8 (eight) hours as needed fo r nausea.  . benzonatate (TESSALON) 200 MG capsule Take 1 capsule (200 mg total) by mouth 3 (three) times daily as needed for cough.  . Fluticasone-Salmeterol (ADVAIR) 100-50 MCG/DOSE AEPB Inhale 1 puff into the lungs 2 (two) times daily.  [name] HYDROcodone-acetaminophen (NORCO/VICODIN) 5-325 MG per tablet Take 1 tablet by mouth every 6 (six) hours as needed.  [name] HYDROcodone-acetaminophen  (NORCO/VICODIN) 5-325 MG per tablet Take 1 tablet by mouth every 6 (six) hours as needed.  . [DISCONTINUED] benzonatate (TESSALON) 200 MG capsule Take 1 capsule (200 mg total) by mouth 3 (three) times daily as needed for cough.     Orders Placed This Encounter  Procedures  . Nocturnal polysomnography (NPSG)    Return in about 4 weeks (around 05/16/2014).

## 2014-04-18 NOTE — Progress Notes (Signed)
Pre-visit discussion using our clinic review tool. No additional management support is needed unless otherwise documented below in the visit note.  

## 2014-04-20 NOTE — Assessment & Plan Note (Signed)
Persistent cough is due to recent viral illness and continued tobacco abuse.  Advair added.

## 2014-04-20 NOTE — Assessment & Plan Note (Signed)
Needs to have OSA ruled out given  Concurrent faitgue/malaise and overweight.  Sleep study advised

## 2014-07-06 ENCOUNTER — Encounter: Payer: Self-pay | Admitting: *Deleted

## 2014-07-20 NOTE — Telephone Encounter (Signed)
Mailed unread message to pt  

## 2014-08-15 HISTORY — PX: BREAST BIOPSY: SHX20

## 2014-08-30 ENCOUNTER — Telehealth: Payer: Self-pay | Admitting: Internal Medicine

## 2014-08-30 NOTE — Telephone Encounter (Signed)
Patient Name: Colleen Washington  DOB: 09/12/1960    Initial Comment Caller states she felt throat closing after eating cheese and peanuts, took benadryl and it helped, unsure if triggered by food or medication allergy or undiagnosed condition   Nurse Assessment  Nurse: Mallie Mussel, RN, Alveta Heimlich Date/Time (Eastern Time): 08/30/2014 9:50:11 AM  Confirm and document reason for call. If symptomatic, describe symptoms. ---Caller states that she had eaten some peanuts and cheese yesterday. She states that this has happened a few times over the years. This began within 10 minutes of eating them. She denies these symptoms now. 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 She denies any symptoms at present.  Has the patient traveled out of the country within the last 30 days? ---No  Does the patient require triage? ---No     Guidelines    Guideline Title Affirmed Question Affirmed Notes  No Guideline or Reference Available Nursing judgment    Final Disposition User   See PCP When Office is Open (within 3 days) Mallie Mussel, Therapist, sports, Alveta Heimlich

## 2014-08-30 NOTE — Telephone Encounter (Signed)
Pt has appoint scheduled with you on 2.17.16.

## 2014-08-30 NOTE — Telephone Encounter (Signed)
Noted, thanks!

## 2014-08-31 ENCOUNTER — Ambulatory Visit: Payer: Self-pay | Admitting: Obstetrics and Gynecology

## 2014-08-31 ENCOUNTER — Ambulatory Visit (INDEPENDENT_AMBULATORY_CARE_PROVIDER_SITE_OTHER): Payer: BLUE CROSS/BLUE SHIELD | Admitting: Nurse Practitioner

## 2014-08-31 ENCOUNTER — Encounter: Payer: Self-pay | Admitting: Nurse Practitioner

## 2014-08-31 VITALS — BP 122/78 | HR 80 | Temp 97.9°F | Resp 14 | Ht 67.0 in | Wt 189.9 lb

## 2014-08-31 DIAGNOSIS — Z1329 Encounter for screening for other suspected endocrine disorder: Secondary | ICD-10-CM

## 2014-08-31 DIAGNOSIS — Z130 Encounter for screening for diseases of the blood and blood-forming organs and certain disorders involving the immune mechanism: Secondary | ICD-10-CM

## 2014-08-31 DIAGNOSIS — E785 Hyperlipidemia, unspecified: Secondary | ICD-10-CM

## 2014-08-31 DIAGNOSIS — R200 Anesthesia of skin: Secondary | ICD-10-CM

## 2014-08-31 DIAGNOSIS — R232 Flushing: Secondary | ICD-10-CM

## 2014-08-31 DIAGNOSIS — N951 Menopausal and female climacteric states: Secondary | ICD-10-CM

## 2014-08-31 DIAGNOSIS — Z720 Tobacco use: Secondary | ICD-10-CM

## 2014-08-31 DIAGNOSIS — R208 Other disturbances of skin sensation: Secondary | ICD-10-CM

## 2014-08-31 LAB — CBC WITH DIFFERENTIAL/PLATELET
Basophils Absolute: 0 10*3/uL (ref 0.0–0.1)
Basophils Relative: 0.9 % (ref 0.0–3.0)
Eosinophils Absolute: 0.5 10*3/uL (ref 0.0–0.7)
Eosinophils Relative: 10.2 % — ABNORMAL HIGH (ref 0.0–5.0)
HCT: 39.9 % (ref 36.0–46.0)
Hemoglobin: 13.8 g/dL (ref 12.0–15.0)
Lymphocytes Relative: 42.6 % (ref 12.0–46.0)
Lymphs Abs: 2.1 10*3/uL (ref 0.7–4.0)
MCHC: 34.5 g/dL (ref 30.0–36.0)
MCV: 85.9 fl (ref 78.0–100.0)
Monocytes Absolute: 0.5 10*3/uL (ref 0.1–1.0)
Monocytes Relative: 9.9 % (ref 3.0–12.0)
Neutro Abs: 1.8 10*3/uL (ref 1.4–7.7)
Neutrophils Relative %: 36.4 % — ABNORMAL LOW (ref 43.0–77.0)
Platelets: 191 10*3/uL (ref 150.0–400.0)
RBC: 4.64 Mil/uL (ref 3.87–5.11)
RDW: 13.3 % (ref 11.5–15.5)
WBC: 4.9 10*3/uL (ref 4.0–10.5)

## 2014-08-31 LAB — TSH: TSH: 1.18 u[IU]/mL (ref 0.35–4.50)

## 2014-08-31 LAB — COMPREHENSIVE METABOLIC PANEL
ALT: 13 U/L (ref 0–35)
AST: 15 U/L (ref 0–37)
Albumin: 4.1 g/dL (ref 3.5–5.2)
Alkaline Phosphatase: 56 U/L (ref 39–117)
BUN: 8 mg/dL (ref 6–23)
CO2: 26 mEq/L (ref 19–32)
Calcium: 9.6 mg/dL (ref 8.4–10.5)
Chloride: 107 mEq/L (ref 96–112)
Creatinine, Ser: 0.79 mg/dL (ref 0.40–1.20)
GFR: 81.1 mL/min (ref >60.00)
Glucose, Bld: 90 mg/dL (ref 70–99)
Potassium: 4.5 mEq/L (ref 3.5–5.1)
Sodium: 139 mEq/L (ref 135–145)
Total Bilirubin: 0.5 mg/dL (ref 0.2–1.2)
Total Protein: 7.3 g/dL (ref 6.0–8.3)

## 2014-08-31 LAB — LIPID PANEL
Cholesterol: 223 mg/dL — ABNORMAL HIGH (ref 0–200)
HDL: 46.6 mg/dL (ref >39.00)
LDL Cholesterol: 155 mg/dL — ABNORMAL HIGH (ref 0–99)
NonHDL: 176.4
Total CHOL/HDL Ratio: 5
Triglycerides: 108 mg/dL (ref 0.0–149.0)
VLDL: 21.6 mg/dL (ref 0.0–40.0)

## 2014-08-31 LAB — T4, FREE: Free T4: 0.84 ng/dL (ref 0.60–1.60)

## 2014-08-31 MED ORDER — NICOTINE 7 MG/24HR TD PT24: 7.0000 mg | MEDICATED_PATCH | Freq: Every day | TRANSDERMAL | Status: DC

## 2014-08-31 NOTE — Progress Notes (Signed)
Subjective:    Patient ID: Colleen Washington, female    DOB: [birth year], 54 y.o.   MRN: [id number]  HPI  Colleen Washington is a 54 yo female with a CC of numbness in arms, fatigue, trouble swallowing, and hot/cold flashes.   1) Numbness in bilateral arms- positional, happens at night mostly. Some tingling, when she straightens them out it goes away x 1 month.   2) Throat swelling  Peanuts and cheese 10 minutes trouble swallowing and like she was choking, got Benadryl, has happened before. Denies tingling of lips, swelling of tongue, trouble breathing. "Feels like something gets stuck". Benadryl is helpful. Pt states she has trouble swallowing pills. Pt has had endoscopy in past. Pt has acid reflux and is on Nexium. Drinks sprite instead of dark sodas and feels this helps.   4) Hot and cold flashes- x 1 month, pt is tired. Family history of thyroid problems.  Wt Readings from Last 3 Encounters:  08/31/14 189 lb 14.4 oz (86.138 kg)  04/18/14 191 lb (86.637 kg)  03/09/14 189 lb 8 oz (85.957 kg)   5) Wants to work on stopping smoking. Wants nicotine patch lower dose   Review of Systems  Constitutional: Positive for chills and diaphoresis. Negative for fever and fatigue.  HENT: Positive for trouble swallowing.        States she has had endoscopy in past  Eyes: Negative for visual disturbance.  Respiratory: Negative for chest tightness, shortness of breath and wheezing.   Cardiovascular: Negative for chest pain, palpitations and leg swelling.  Gastrointestinal: Negative for nausea, vomiting, abdominal pain, diarrhea, constipation, blood in stool and abdominal distention.  Skin: Negative for rash.  Neurological: Positive for numbness. Negative for dizziness, weakness and headaches.       Numbness bilateral arms  Psychiatric/Behavioral: The patient is not nervous/anxious.    Past Medical History  Diagnosis Date  . allergic rhinitis   . Depression   . History of diverticulitis of colon   . GERD  (gastroesophageal reflux disease)   . Hyperlipidemia   . Irritable bowel syndrome   . H/O migraine   . Hypertension   . Herniated intervertebral disc     History   Social History  . Marital Status: Married    Spouse Name: N/A  . Number of Children: N/A  . Years of Education: N/A   Occupational History  . Not on file.   Social History Main Topics  . Smoking status: Current Every Day Smoker -- 0.30 packs/day for 20 years    Types: Cigarettes  . Smokeless tobacco: Never Used  . Alcohol Use: Yes  . Drug Use: No  . Sexual Activity: Not Currently   Other Topics Concern  . Not on file   Social History Narrative    Past Surgical History  Procedure Laterality Date  . Abdominal hysterectomy  2003    Family History  Problem Relation Age of Onset  . Arthritis Mother   . Hyperlipidemia Mother   . Cancer Father     colon  . Arthritis Maternal Grandmother   . Hyperlipidemia Maternal Grandfather     Allergies  Allergen Reactions  . Compazine [Prochlorperazine] Swelling    Current Outpatient Prescriptions on File Prior to Visit  Medication Sig Dispense Refill  . buPROPion (WELLBUTRIN XL) 150 MG 24 hr tablet Take 1 tablet (150 mg total) by mouth daily. 30 tablet 2  . busPIRone (BUSPAR) 5 MG tablet Take 1 tablet (5 mg total) by mouth 3 (  three) times daily. 90 tablet 2  . cetirizine (ZYRTEC) 10 MG tablet Take 10 mg by mouth daily.    . cyclobenzaprine (FLEXERIL) 10 MG tablet Take 1 tablet (10 mg total) by mouth 3 (three) times daily as needed for muscle spasms. 30 tablet 2  . fluticasone (FLONASE) 50 MCG/ACT nasal spray Place 2 sprays into the nose daily.    . Fluticasone-Salmeterol (ADVAIR) 100-50 MCG/DOSE AEPB Inhale 1 puff into the lungs 2 (two) times daily. 1 each 3  . HYDROcodone-acetaminophen (NORCO/VICODIN) 5-325 MG per tablet Take 1 tablet by mouth every 6 (six) hours as needed. 120 tablet 0  . HYDROcodone-acetaminophen (NORCO/VICODIN) 5-325 MG per tablet Take 1  tablet by mouth every 6 (six) hours as needed. 40 tablet 0  . ondansetron (ZOFRAN) 4 MG tablet Take 1 tablet (4 mg total) by mouth every 8 (eight) hours as needed fo r nausea. 30 tablet 1  . chlorpheniramine-HYDROcodone (TUSSIONEX) 10-8 MG/5ML LQCR Take 5 mLs by mouth at bedtime as needed for cough.    . esomeprazole (NEXIUM) 40 MG capsule Take 1 capsule (40 mg total) by mouth daily. 90 capsule 3   No current facility-administered medications on file prior to visit.      Objective:   Physical Exam  Constitutional: She is oriented to person, place, and time. She appears well-developed and well-nourished. No distress.  BP 122/78 mmHg  Pulse 80  Temp(Src) 97.9 F (36.6 C) (Oral)  Resp 14  Ht 5\' 7"  (1.702 m)  Wt 189 lb 14.4 oz (86.138 kg)  BMI 29.74 kg/m2  SpO2 96%   HENT:  Head: Normocephalic and atraumatic.  Right Ear: External ear normal.  Left Ear: External ear normal.  Eyes: EOM are normal. Pupils are equal, round, and reactive to light. Right eye exhibits no discharge. Left eye exhibits no discharge. No scleral icterus.  Neck: Normal range of motion. Neck supple. No thyromegaly present.  Cardiovascular: Normal rate, regular rhythm, normal heart sounds and intact distal pulses.  Exam reveals no gallop and no friction rub.   No murmur heard. Pulmonary/Chest: Effort normal and breath sounds normal. No respiratory distress. She has no wheezes. She has no rales. She exhibits no tenderness.  Lymphadenopathy:    She has no cervical adenopathy.  Neurological: She is alert and oriented to person, place, and time. No cranial nerve deficit. She exhibits normal muscle tone. Coordination normal.  Skin: Skin is warm and dry. No rash noted. She is not diaphoretic.  Psychiatric: She has a normal mood and affect. Judgment and thought content normal. She is hyperactive.  Pt flows through a lot of thoughts without completing. She has to be redirected often.       Assessment & Plan:

## 2014-08-31 NOTE — Patient Instructions (Signed)
Smoking Cessation Quitting smoking is important to your health and has many advantages. However, it is not always easy to quit since nicotine is a very addictive drug. Oftentimes, people try 3 times or more before being able to quit. This document explains the best ways for you to prepare to quit smoking. Quitting takes hard work and a lot of effort, but you can do it. ADVANTAGES OF QUITTING SMOKING  You will live longer, feel better, and live better.  Your body will feel the impact of quitting smoking almost immediately.  Within 20 minutes, blood pressure decreases. Your pulse returns to its normal level.  After 8 hours, carbon monoxide levels in the blood return to normal. Your oxygen level increases.  After 24 hours, the chance of having a heart attack starts to decrease. Your breath, hair, and body stop smelling like smoke.  After 48 hours, damaged nerve endings begin to recover. Your sense of taste and smell improve.  After 72 hours, the body is virtually free of nicotine. Your bronchial tubes relax and breathing becomes easier.  After 2 to 12 weeks, lungs can hold more air. Exercise becomes easier and circulation improves.  The risk of having a heart attack, stroke, cancer, or lung disease is greatly reduced.  After 1 year, the risk of coronary heart disease is cut in half.  After 5 years, the risk of stroke falls to the same as a nonsmoker.  After 10 years, the risk of lung cancer is cut in half and the risk of other cancers decreases significantly.  After 15 years, the risk of coronary heart disease drops, usually to the level of a nonsmoker.  If you are pregnant, quitting smoking will improve your chances of having a healthy baby.  The people you live with, especially any children, will be healthier.  You will have extra money to spend on things other than cigarettes. QUESTIONS TO THINK ABOUT BEFORE ATTEMPTING TO QUIT You may want to talk about your answers with your  health care provider.  Why do you want to quit?  If you tried to quit in the past, what helped and what did not?  What will be the most difficult situations for you after you quit? How will you plan to handle them?  Who can help you through the tough times? Your family? Friends? A health care provider?  What pleasures do you get from smoking? What ways can you still get pleasure if you quit? Here are some questions to ask your health care provider:  How can you help me to be successful at quitting?  What medicine do you think would be best for me and how should I take it?  What should I do if I need more help?  What is smoking withdrawal like? How can I get information on withdrawal? GET READY  Set a quit date.  Change your environment by getting rid of all cigarettes, ashtrays, matches, and lighters in your home, car, or work. Do not let people smoke in your home.  Review your past attempts to quit. Think about what worked and what did not. GET SUPPORT AND ENCOURAGEMENT You have a better chance of being successful if you have help. You can get support in many ways.  Tell your family, friends, and coworkers that you are going to quit and need their support. Ask them not to smoke around you.  Get individual, group, or telephone counseling and support. Programs are available at local hospitals and health centers. Call   your local health department for information about programs in your area.  Spiritual beliefs and practices may help some smokers quit.  Download a "quit meter" on your computer to keep track of quit statistics, such as how long you have gone without smoking, cigarettes not smoked, and money saved.  Get a self-help book about quitting smoking and staying off tobacco. LEARN NEW SKILLS AND BEHAVIORS  Distract yourself from urges to smoke. Talk to someone, go for a walk, or occupy your time with a task.  Change your normal routine. Take a different route to work.  Drink tea instead of coffee. Eat breakfast in a different place.  Reduce your stress. Take a hot bath, exercise, or read a book.  Plan something enjoyable to do every day. Reward yourself for not smoking.  Explore interactive web-based programs that specialize in helping you quit. GET MEDICINE AND USE IT CORRECTLY Medicines can help you stop smoking and decrease the urge to smoke. Combining medicine with the above behavioral methods and support can greatly increase your chances of successfully quitting smoking.  Nicotine replacement therapy helps deliver nicotine to your body without the negative effects and risks of smoking. Nicotine replacement therapy includes nicotine gum, lozenges, inhalers, nasal sprays, and skin patches. Some may be available over-the-counter and others require a prescription.  Antidepressant medicine helps people abstain from smoking, but how this works is unknown. This medicine is available by prescription.  Nicotinic receptor partial agonist medicine simulates the effect of nicotine in your brain. This medicine is available by prescription. Ask your health care provider for advice about which medicines to use and how to use them based on your health history. Your health care provider will tell you what side effects to look out for if you choose to be on a medicine or therapy. Carefully read the information on the package. Do not use any other product containing nicotine while using a nicotine replacement product.  RELAPSE OR DIFFICULT SITUATIONS Most relapses occur within the first 3 months after quitting. Do not be discouraged if you start smoking again. Remember, most people try several times before finally quitting. You may have symptoms of withdrawal because your body is used to nicotine. You may crave cigarettes, be irritable, feel very hungry, cough often, get headaches, or have difficulty concentrating. The withdrawal symptoms are only temporary. They are strongest  when you first quit, but they will go away within 10-14 days. To reduce the chances of relapse, try to:  Avoid drinking alcohol. Drinking lowers your chances of successfully quitting.  Reduce the amount of caffeine you consume. Once you quit smoking, the amount of caffeine in your body increases and can give you symptoms, such as a rapid heartbeat, sweating, and anxiety.  Avoid smokers because they can make you want to smoke.  Do not let weight gain distract you. Many smokers will gain weight when they quit, usually less than 10 pounds. Eat a healthy diet and stay active. You can always lose the weight gained after you quit.  Find ways to improve your mood other than smoking. FOR MORE INFORMATION  www.smokefree.gov  Document Released: 06/25/2001 Document Revised: 11/15/2013 Document Reviewed: 10/10/2011 [employer] Patient Information 2015 [employer]. This information is not intended to replace advice given to you by your health care provider. Make sure you discuss any questions you have with your health care provider.  

## 2014-08-31 NOTE — Progress Notes (Signed)
Pre visit review using our clinic review tool, if applicable. No additional management support is needed unless otherwise documented below in the visit note. 

## 2014-09-02 ENCOUNTER — Telehealth: Payer: Self-pay | Admitting: Internal Medicine

## 2014-09-02 ENCOUNTER — Ambulatory Visit: Payer: Self-pay | Admitting: Obstetrics and Gynecology

## 2014-09-02 ENCOUNTER — Encounter: Payer: Self-pay | Admitting: Nurse Practitioner

## 2014-09-02 DIAGNOSIS — Z780 Asymptomatic menopausal state: Secondary | ICD-10-CM | POA: Insufficient documentation

## 2014-09-02 DIAGNOSIS — R200 Anesthesia of skin: Secondary | ICD-10-CM | POA: Insufficient documentation

## 2014-09-02 NOTE — Assessment & Plan Note (Signed)
Still smoking some. Thinks the last patch was "too strong" causing jitters. Asked her to work on stopping smoking and talked about different options. She is agreeable to trying the lower dose (sept 3) of the nicoderm cq patch. Rx sent in to pharmacy.

## 2014-09-02 NOTE — Telephone Encounter (Signed)
Patient called office she did not understand what Eosinophils were explained thet are the part of WBC's that rise with allergies to help combat. Explained by asking patient has been sneezing , itchy eyes, runny nose patient stated yes explained allergy like symptoms. Patient also concerned if she should start cholesterol medication please advise,

## 2014-09-02 NOTE — Assessment & Plan Note (Signed)
Pt requesting full blood work. Adding lipid panel.

## 2014-09-02 NOTE — Assessment & Plan Note (Signed)
Bilateral arm numbness due to positioning at night. Stable. Pt was instructed to watch arm position at night. Wrapping a towel around her arms to keep from bending is another way to cue not to flex arms when sleeping. Will follow.

## 2014-09-02 NOTE — Assessment & Plan Note (Signed)
Pt alternating between "hot and cold flashes". Pt is s/p hysterectomy in 2003. She states her family has an extensive history of thyroid issues, but only referenced her mother. Will check T4 free and TSH. Follow after results.

## 2014-09-05 ENCOUNTER — Telehealth: Payer: Self-pay | Admitting: *Deleted

## 2014-09-05 ENCOUNTER — Ambulatory Visit: Payer: BLUE CROSS/BLUE SHIELD | Admitting: Internal Medicine

## 2014-09-05 NOTE — Telephone Encounter (Signed)
Message sent

## 2014-09-05 NOTE — Telephone Encounter (Signed)
Left message for patient to return call to office. 

## 2014-09-05 NOTE — Telephone Encounter (Signed)
Please advise 

## 2014-09-05 NOTE — Telephone Encounter (Signed)
Pt reports that she has to go to court tomorrow (for a home invasion case) & her anxiety is pretty high right now. Wants to know if she can take two Buspar or can she try something else. Please advise. Patient also aware to check her mychart throughout the day for a response.

## 2014-09-05 NOTE — Telephone Encounter (Signed)
She is eligible due to her risk score for increase risk of heart attack or stroke to start taking a Statin. If she would like to start a medication let me know and I will call it in for her. She can always discuss risks, benefits, and other concerns if she has any at her next appointment.

## 2014-09-06 ENCOUNTER — Ambulatory Visit: Payer: Self-pay | Admitting: Obstetrics and Gynecology

## 2014-09-07 ENCOUNTER — Ambulatory Visit: Payer: Self-pay | Admitting: Internal Medicine

## 2014-09-09 ENCOUNTER — Ambulatory Visit (INDEPENDENT_AMBULATORY_CARE_PROVIDER_SITE_OTHER): Payer: BLUE CROSS/BLUE SHIELD | Admitting: Internal Medicine

## 2014-09-09 ENCOUNTER — Encounter: Payer: Self-pay | Admitting: Internal Medicine

## 2014-09-09 VITALS — BP 120/86 | HR 97 | Temp 97.6°F | Resp 16 | Ht 67.0 in | Wt 188.0 lb

## 2014-09-09 DIAGNOSIS — K589 Irritable bowel syndrome without diarrhea: Secondary | ICD-10-CM

## 2014-09-09 DIAGNOSIS — Z780 Asymptomatic menopausal state: Secondary | ICD-10-CM

## 2014-09-09 DIAGNOSIS — K219 Gastro-esophageal reflux disease without esophagitis: Secondary | ICD-10-CM

## 2014-09-09 MED ORDER — DICYCLOMINE HCL 20 MG PO TABS: 20.0000 mg | ORAL_TABLET | Freq: Three times a day (TID) | ORAL | Status: DC

## 2014-09-09 MED ORDER — ONDANSETRON HCL 4 MG PO TABS
4.0000 mg | ORAL_TABLET | Freq: Three times a day (TID) | ORAL | Status: DC | PRN
Start: 2014-09-09 — End: 2015-07-21

## 2014-09-09 MED ORDER — HYOSCYAMINE SULFATE 0.125 MG SL SUBL: 0.1250 mg | SUBLINGUAL_TABLET | SUBLINGUAL | Status: DC | PRN

## 2014-09-09 MED ORDER — BUPROPION HCL ER (XL) 150 MG PO TB24: 150.0000 mg | ORAL_TABLET | Freq: Every day | ORAL | Status: DC

## 2014-09-09 MED ORDER — FLUTICASONE PROPIONATE 50 MCG/ACT NA SUSP: 2.0000 | Freq: Every day | NASAL | Status: DC

## 2014-09-09 NOTE — Progress Notes (Signed)
Patient ID: Colleen Washington, female   DOB: [birth year], 54 y.o.   MRN: [id number] Patient Active Problem List   Diagnosis Date Noted  . Numbness of arm 09/02/2014  . Menopause 09/02/2014  . Back pain 03/12/2014  . Overweight (BMI 25.0-29.9) 08/24/2013  . Visit for preventive health examination 08/24/2013  . Snoring 08/24/2013  . Symptoms, such as flushing, sleeplessness, headache, lack of concentration, associated with the menopause 08/24/2013  . Encounter for drug screening 08/13/2013  . Acute left-sided back pain with sciatica 05/06/2013  . Migraine, unspecified, without mention of intractable migraine without mention of status migrainosus 08/26/2012  . 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 Bronchitis, chronic 05/09/2012  . Depression   . History of diverticulitis of colon   . GERD (gastroesophageal reflux disease)   . Hyperlipidemia   . Irritable bowel syndrome   . H/O migraine   . Hypertension   . Herniated intervertebral disc   . Tobacco abuse 03/04/2012    Subjective:  CC:   Chief Complaint  Patient presents with  . Acute Visit    Pain like you have to have a BM but hen cant't go and then like diarrhea.   . Abdominal Pain    Patient staed IBS     HPI:   KORISSA HORSFORD is a 54 y.o. female who presents for  Follow up on multiple issues.   Abnormal mammogram.  She underwent a breast biopsy that was normal  IBS>  Since she has started eating more salads,  IBS has been acting up  Gave  up caffeine for over a month and became excessive sleepy , even in the morning   She snores ,  But has not been observed by her husband to have apneic spells   Had an allergic reaction to trail mix recently , but has never had a reaction to peanuts or other nuts in the past.    Past Medical History  Diagnosis Date  . allergic rhinitis   . Depression   . History of diverticulitis of colon   . GERD (gastroesophageal reflux disease)   . Hyperlipidemia    . Irritable bowel syndrome   . H/O migraine   . Hypertension   . Herniated intervertebral disc     Past Surgical History  Procedure Laterality Date  . Abdominal hysterectomy  2003       The following portions of the patient's history were reviewed and updated as appropriate: Allergies, current medications, and problem list.    Review of Systems:   Patient denies headache, fevers, malaise, unintentional weight loss, skin rash, eye pain, sinus congestion and sinus pain, sore throat, dysphagia,  hemoptysis , cough, dyspnea, wheezing, chest pain, palpitations, orthopnea, edema, abdominal pain, nausea, melena, diarrhea, constipation, flank pain, dysuria, hematuria, urinary  Frequency, nocturia, numbness, tingling, seizures,  Focal weakness, Loss of consciousness,  Tremor, insomnia, depression, anxiety, and suicidal ideation.     History   Social History  . Marital Status: Married    Spouse Name: N/A  . Number of Children: N/A  . Years of Education: N/A   Occupational History  . Not on file.   Social History Main Topics  . Smoking status: Current Every Day Smoker -- 0.30 packs/day for 20 years    Types: Cigarettes  . Smokeless tobacco: Never Used  . Alcohol Use: Yes  . Drug Use: No  . Sexual Activity: Not Currently   Other Topics Concern  . Not on file  Social History Narrative    Objective:  Filed Vitals:   09/09/14 1630  BP: 120/86  Pulse: 97  Temp: 97.6 F (36.4 C)  Resp: 16     General appearance: alert, cooperative and appears stated age Ears: normal TM's and external ear canals both ears Throat: lips, mucosa, and tongue normal; teeth and gums normal Neck: no adenopathy, no carotid bruit, supple, symmetrical, trachea midline and thyroid not enlarged, symmetric, no tenderness/mass/nodules Back: symmetric, no curvature. ROM normal. No CVA tenderness. Lungs: clear to auscultation bilaterally Heart: regular rate and rhythm, S1, S2 normal, no murmur,  click, rub or gallop Abdomen: soft, non-tender; bowel sounds normal; no masses,  no organomegaly Pulses: 2+ and symmetric Skin: Skin color, texture, turgor normal. No rashes or lesions Lymph nodes: Cervical, supraclavicular, and axillary nodes normal.  Assessment and Plan:  GERD (gastroesophageal reflux disease) Advised to use pepcid bid for reflux   Irritable bowel syndrome adding dicyclomine qid for reduction in postprandial cramping.  Prn hyoscyamine    Menopause She has been experiencing vaginal dryness which is interfering with intercourse.  Vaginal lubricant discussed.  As a smoker she is not a good candidate for estrogoen  A total of 25 minutes of face to face time was spent with patient more than half of which was spent in counselling on the above mentioned issues.  Updated Medication List Outpatient Encounter Prescriptions as of 09/09/2014  Medication Sig  . buPROPion (WELLBUTRIN XL) 150 MG 24 hr tablet Take 1 tablet (150 mg total) by mouth daily.  . busPIRone (BUSPAR) 5 MG tablet Take 1 tablet (5 mg total) by mouth 3 (three) times daily.  . cetirizine (ZYRTEC) 10 MG tablet Take 10 mg by mouth daily.  . nicotine (NICODERM CQ - DOSED IN MG/24 HR) 7 mg/24hr patch Place 1 patch (7 mg total) onto the skin daily.  . ondansetron (ZOFRAN) 4 MG tablet Take 1 tablet (4 mg total) by mouth every 8 (eight) hours as needed for nausea or vomiting.  . [DISCONTINUED] buPROPion (WELLBUTRIN XL) 150 MG 24 hr tablet Take 1 tablet (150 mg total) by mouth daily.  . [DISCONTINUED] ondansetron (ZOFRAN) 4 MG tablet Take 1 tablet (4 mg total) by mouth every 8 (eight) hours as needed fo r nausea.  . chlorpheniramine-HYDROcodone (TUSSIONEX) 10-8 MG/5ML LQCR Take 5 mLs by mouth at bedtime as needed for cough.  . dicyclomine (BENTYL) 20 MG tablet Take 1 tablet (20 mg total) by mouth 4 (four) times daily -  before meals and at bedtime.  [name] esomeprazole (NEXIUM) 40 MG capsule Take 1 capsule (40 mg total) by  mouth daily.  . fluticasone (FLONASE) 50 MCG/ACT nasal spray Place 2 sprays into both nostrils daily.  . hyoscyamine (LEVSIN SL) 0.125 MG SL tablet Place 1 tablet (0.125 mg total) under the tongue every 4 (four) hours as needed.  . [DISCONTINUED] cyclobenzaprine (FLEXERIL) 10 MG tablet Take 1 tablet (10 mg total) by mouth 3 (three) times daily as needed for muscle spasms. (Patient not taking: Reported on 09/09/2014)  . [DISCONTINUED] fluticasone (FLONASE) 50 MCG/ACT nasal spray Place 2 sprays into the nose daily.  . [DISCONTINUED] Fluticasone-Salmeterol (ADVAIR) 100-50 MCG/DOSE AEPB Inhale 1 puff into the lungs 2 (two) times daily. (Patient not taking: Reported on 09/09/2014)  . [DISCONTINUED] HYDROcodone-acetaminophen (NORCO/VICODIN) 5-325 MG per tablet Take 1 tablet by mouth every 6 (six) hours as needed. (Patient not taking: Reported on 09/09/2014)  . [DISCONTINUED] HYDROcodone-acetaminophen (NORCO/VICODIN) 5-325 MG per tablet Take 1 tablet by mouth every  6 (six) hours as needed. (Patient not taking: Reported on 09/09/2014)     Orders Placed This Encounter  Procedures  . HM COLONOSCOPY    No Follow-up on file.

## 2014-09-09 NOTE — Progress Notes (Signed)
Pre-visit discussion using our clinic review tool. No additional management support is needed unless otherwise documented below in the visit note.  

## 2014-09-09 NOTE — Patient Instructions (Signed)
Try taking dicyclomine 20 minutes before each meal  To prevent cramping   use the hyoscyamine if you still develop cramping    [place] makes a vaginal lubricant that you insert as a pill and it melts to your body temperature   Take a pepcid  In the evening before dinner to manage  fruit juice induced reflux  Congratulations on reducing your tobacco use

## 2014-09-11 ENCOUNTER — Encounter: Payer: Self-pay | Admitting: Internal Medicine

## 2014-09-11 NOTE — Assessment & Plan Note (Addendum)
adding dicyclomine qid for reduction in postprandial cramping.  Prn hyoscyamine

## 2014-09-11 NOTE — Assessment & Plan Note (Signed)
Advised to use pepcid bid for reflux

## 2014-09-11 NOTE — Assessment & Plan Note (Addendum)
She has been experiencing vaginal dryness which is interfering with intercourse.  Vaginal lubricant discussed.  As a smoker she is not a good candidate for estrogoen

## 2014-09-27 ENCOUNTER — Encounter: Payer: Self-pay | Admitting: Internal Medicine

## 2014-09-27 MED ORDER — SCOPOLAMINE 1 MG/3DAYS TD PT72: 1.0000 | MEDICATED_PATCH | TRANSDERMAL | Status: DC

## 2014-10-05 ENCOUNTER — Telehealth: Payer: Self-pay | Admitting: Internal Medicine

## 2014-10-05 NOTE — Telephone Encounter (Signed)
The patient stated that she was told at her last visit that a steroid would be called to the pharmacy for the crack in her foot .

## 2014-10-06 NOTE — Telephone Encounter (Signed)
Please advise 

## 2014-10-06 NOTE — Telephone Encounter (Signed)
I'm sorry,  Don't recall discussing her foot and can't think of what  foot condition I would call in a steroid cream for.  If she can give you some more detail , t might jog my memory

## 2014-10-19 ENCOUNTER — Encounter: Payer: Self-pay | Admitting: Nurse Practitioner

## 2014-10-19 ENCOUNTER — Other Ambulatory Visit: Payer: Self-pay | Admitting: Internal Medicine

## 2014-10-19 ENCOUNTER — Ambulatory Visit (INDEPENDENT_AMBULATORY_CARE_PROVIDER_SITE_OTHER): Payer: BLUE CROSS/BLUE SHIELD | Admitting: Nurse Practitioner

## 2014-10-19 VITALS — BP 122/82 | HR 75 | Temp 97.7°F | Resp 12 | Ht 67.0 in | Wt 189.0 lb

## 2014-10-19 DIAGNOSIS — J069 Acute upper respiratory infection, unspecified: Secondary | ICD-10-CM | POA: Diagnosis not present

## 2014-10-19 DIAGNOSIS — B9789 Other viral agents as the cause of diseases classified elsewhere: Principal | ICD-10-CM

## 2014-10-19 MED ORDER — HYDROCOD POLST-CHLORPHEN POLST 10-8 MG/5ML PO LQCR
5.0000 mL | Freq: Two times a day (BID) | ORAL | Status: DC | PRN
Start: 2014-10-19 — End: 2015-01-31

## 2014-10-19 MED ORDER — OMEPRAZOLE 40 MG PO CPDR: 40.0000 mg | DELAYED_RELEASE_CAPSULE | Freq: Every day | ORAL | Status: DC

## 2014-10-19 MED ORDER — GUAIFENESIN-CODEINE 100-10 MG/5ML PO SOLN
5.0000 mL | Freq: Three times a day (TID) | ORAL | Status: DC | PRN
Start: 2014-10-19 — End: 2014-10-19

## 2014-10-19 NOTE — Progress Notes (Signed)
Pre visit review using our clinic review tool, if applicable. No additional management support is needed unless otherwise documented below in the visit note. 

## 2014-10-19 NOTE — Patient Instructions (Signed)
Start back on the Zyrtec (get some K-Y)  1 teaspoon up to 3 x a day (will make you sleepy) of the cough syrup.   MyChart message if worse after day 10 of symptoms.

## 2014-10-19 NOTE — Progress Notes (Signed)
   Subjective:    Patient ID: Colleen Washington, female    DOB: 1961-02-14, 54 y.o.   MRN: [id number]  HPI  Colleen Washington is a 54 yo female with a CC 4 days of cough and fatigue.   1) Sneezing as soon as arrived to [place] from a cruise Flonase, inhaler, ears popping, itchy watery eyes  Benadryl- not helpful   2) Failed Nexium will try Prilosec for increased heart burn.   3) Fatigue is moderate-severe. She reports she is not a nap taking person and feels the need to rest constantly.   Review of Systems  Constitutional: Positive for fatigue. Negative for fever, chills and diaphoresis.  HENT: Positive for sneezing. Negative for postnasal drip, rhinorrhea, sinus pressure and sore throat.   Eyes: Negative for visual disturbance.  Respiratory: Positive for cough. Negative for chest tightness, shortness of breath and wheezing.   Cardiovascular: Negative for chest pain, palpitations and leg swelling.  Gastrointestinal: Negative for nausea, vomiting and diarrhea.  Skin: Negative for rash.      Objective:   Physical Exam  Constitutional: She is oriented to person, place, and time. She appears well-developed and well-nourished. No distress.  BP 122/82 mmHg  Pulse 75  Temp(Src) 97.7 F (36.5 C) (Oral)  Resp 12  Ht 5\' 7"  (1.702 m)  Wt 189 lb (85.73 kg)  BMI 29.59 kg/m2  SpO2 98%   HENT:  Head: Normocephalic and atraumatic.  Right Ear: External ear normal.  Left Ear: External ear normal.  Eyes: Right eye exhibits no discharge. Left eye exhibits no discharge. No scleral icterus.  Cardiovascular: Normal rate, regular rhythm and normal heart sounds.  Exam reveals no gallop and no friction rub.   No murmur heard. Pulmonary/Chest: Effort normal and breath sounds normal. No respiratory distress. She has no wheezes. She has no rales. She exhibits no tenderness.  Neurological: She is alert and oriented to person, place, and time. No cranial nerve deficit. She exhibits normal muscle tone. Coordination  normal.  Skin: Skin is warm and dry. No rash noted. She is not diaphoretic.  Psychiatric: She has a normal mood and affect. Her behavior is normal. Judgment and thought content normal.      Assessment & Plan:

## 2014-10-19 NOTE — Telephone Encounter (Signed)
Please advise in Carrie's absence

## 2014-10-19 NOTE — Telephone Encounter (Signed)
Placed up front for pickup ° °

## 2014-10-26 ENCOUNTER — Telehealth: Payer: Self-pay

## 2014-10-26 NOTE — Telephone Encounter (Signed)
Called patient back.  No answer. Will follow up.

## 2014-10-26 NOTE — Telephone Encounter (Signed)
Pt called and said she has nausea and extreme fatigue.  Pt states she has enough medication for nausea but is bothered by the fatigue.  Please advise.

## 2014-10-26 NOTE — Telephone Encounter (Signed)
Did the prednisone help? Is she still taking the codeine cough syrup 3 x a day? Extreme fatigue is complicated. We may need to additional work up. Are her other symptoms better?

## 2014-10-27 ENCOUNTER — Other Ambulatory Visit (INDEPENDENT_AMBULATORY_CARE_PROVIDER_SITE_OTHER): Payer: BLUE CROSS/BLUE SHIELD

## 2014-10-27 ENCOUNTER — Other Ambulatory Visit: Payer: Self-pay | Admitting: Nurse Practitioner

## 2014-10-27 ENCOUNTER — Telehealth: Payer: Self-pay

## 2014-10-27 DIAGNOSIS — R5382 Chronic fatigue, unspecified: Secondary | ICD-10-CM

## 2014-10-27 NOTE — Telephone Encounter (Signed)
Pt notified and verbalized understanding.  Pt will come in today for labs.

## 2014-10-27 NOTE — Telephone Encounter (Signed)
I will write the order for labs and she can make a lab appointment. If the labs come back normal we will have to do a visit.

## 2014-10-27 NOTE — Telephone Encounter (Signed)
Patient only has the nausea and extreme fatigue.  All other symptoms have subsided.  Prednisone helped.  Pt says she has had trouble with low vitamin D in the past and wonders if this could be the issue.  Pt is willing to come in for labs today.  Please advise.

## 2014-10-28 ENCOUNTER — Other Ambulatory Visit: Payer: Self-pay | Admitting: Nurse Practitioner

## 2014-10-28 ENCOUNTER — Telehealth: Payer: Self-pay

## 2014-10-28 LAB — VITAMIN D 25 HYDROXY (VIT D DEFICIENCY, FRACTURES): VITD: 22.57 ng/mL — ABNORMAL LOW (ref 30.00–100.00)

## 2014-10-28 MED ORDER — VITAMIN D (ERGOCALCIFEROL) 1.25 MG (50000 UNIT) PO CAPS: 50000.0000 [IU] | ORAL_CAPSULE | ORAL | Status: DC

## 2014-10-28 NOTE — Telephone Encounter (Signed)
Patient called to check on lab results and plan moving forward.  No new results found, please advise?

## 2014-10-28 NOTE — Telephone Encounter (Signed)
Labs still pending.  Will follow up with patient.

## 2014-10-29 DIAGNOSIS — B9789 Other viral agents as the cause of diseases classified elsewhere: Principal | ICD-10-CM

## 2014-10-29 DIAGNOSIS — J069 Acute upper respiratory infection, unspecified: Secondary | ICD-10-CM | POA: Insufficient documentation

## 2014-10-29 NOTE — Assessment & Plan Note (Signed)
Will try Zyrtec and tussionex syrup. Mychart [place] if not improved in 10 days.

## 2014-11-02 ENCOUNTER — Telehealth: Payer: Self-pay | Admitting: *Deleted

## 2014-11-02 NOTE — Telephone Encounter (Signed)
States she started Vitamin D on Monday. Still having extreme fatigue. Wants to know how long until she can tell a difference. She is drinking energy drinks and exercises but wants to know what she can do to improve fatigue. Advised per Carrie's telephone note that she would need to be seen if persistent fatigue. Pt didn't want to schedule an appointment, just wanted to know what she needed to do until her Vitamin D level improved?

## 2014-11-03 ENCOUNTER — Other Ambulatory Visit: Payer: Self-pay | Admitting: Nurse Practitioner

## 2014-11-03 ENCOUNTER — Encounter: Payer: Self-pay | Admitting: *Deleted

## 2014-11-03 MED ORDER — AZITHROMYCIN 250 MG PO TABS: ORAL_TABLET | ORAL | Status: DC

## 2014-11-03 NOTE — Telephone Encounter (Signed)
It will take awhile to get vitamin D to range with consistent use. Unsure of exact amount of time. Fatigue has many causes and if she does not want to be seen by myself or her PCP she will just have to exercise, eat a healthy diet, and take the Vitamin D as directed.

## 2014-11-03 NOTE — Telephone Encounter (Signed)
Spoke to pt, states she has not developed a sore throat and thinks her fatigue is due to that. She is requesting an abx. Advised she would need an appt. Scheduled with carrie 11/04/14

## 2014-11-04 ENCOUNTER — Ambulatory Visit: Payer: BLUE CROSS/BLUE SHIELD | Admitting: Nurse Practitioner

## 2014-11-07 ENCOUNTER — Encounter: Payer: Self-pay | Admitting: Nurse Practitioner

## 2014-11-07 LAB — SURGICAL PATHOLOGY

## 2014-11-07 MED ORDER — FLUCONAZOLE 150 MG PO TABS: 150.0000 mg | ORAL_TABLET | Freq: Every day | ORAL | Status: DC

## 2014-11-07 NOTE — Telephone Encounter (Signed)
Please advise 

## 2014-11-07 NOTE — Telephone Encounter (Signed)
Sent rx for fluconazole

## 2015-01-31 ENCOUNTER — Ambulatory Visit (INDEPENDENT_AMBULATORY_CARE_PROVIDER_SITE_OTHER): Payer: BLUE CROSS/BLUE SHIELD | Admitting: Nurse Practitioner

## 2015-01-31 ENCOUNTER — Encounter: Payer: Self-pay | Admitting: Nurse Practitioner

## 2015-01-31 VITALS — BP 118/82 | HR 85 | Temp 97.5°F | Resp 16 | Ht 64.0 in | Wt 194.0 lb

## 2015-01-31 DIAGNOSIS — M5442 Lumbago with sciatica, left side: Secondary | ICD-10-CM

## 2015-01-31 MED ORDER — PREDNISONE 10 MG PO TABS: ORAL_TABLET | ORAL | Status: DC

## 2015-01-31 NOTE — Assessment & Plan Note (Signed)
Pt was examined and found no significant findings on exam. Prednisone taper given and instructions reviewed with pt and placed on AVS. Advised against NSAID use with this. Gave pt back stretches to perform at home. FU prn worsening/failure to improve.

## 2015-01-31 NOTE — Patient Instructions (Signed)
Prednisone with breakfast  6 tablets on day 1, 5 tablets on day 2, 4 tablets on day 3, 3 tablets on day 4, 2 tablets day 5, 1 tablet on day 6...done!   Call us if not helpful after finishing prednisone. Stretch!!!

## 2015-01-31 NOTE — Progress Notes (Signed)
   Subjective:    Patient ID: Colleen Washington, female    DOB: [birth year], 54 y.o.   MRN: [id number]  HPI  Colleen Washington is a 54 yo female with a CC of sciatic nerve pain.   1) Low back pain on left and down to toes- Chiropractor, tens unit, calf feels tight, pain in the middle of the night, bottom of foot, feet feel sore. Ongoing problem, worse in last few days.   Aleve- not helpful  Tylenol- not helpful   Review of Systems  Constitutional: Negative for fever, chills, diaphoresis and fatigue.  Respiratory: Negative for chest tightness, shortness of breath and wheezing.   Cardiovascular: Negative for chest pain, palpitations and leg swelling.  Gastrointestinal: Negative for nausea, vomiting and diarrhea.  Musculoskeletal: Positive for myalgias, back pain and arthralgias. Negative for joint swelling, gait problem, neck pain and neck stiffness.  Skin: Negative for rash.  Neurological: Negative for dizziness, weakness, numbness and headaches.       Denies losing bowel/bladder function, saddle anesthesia, or weakness of extremities.   Psychiatric/Behavioral: The patient is not nervous/anxious.        Objective:   Physical Exam  Constitutional: She is oriented to person, place, and time. She appears well-developed and well-nourished. No distress.  BP 118/82 mmHg  Pulse 85  Temp(Src) 97.5 F (36.4 C)  Resp 16  Ht 5\' 4"  (1.626 m)  Wt 194 lb (87.998 kg)  BMI 33.28 kg/m2  SpO2 97%   HENT:  Head: Normocephalic and atraumatic.  Right Ear: External ear normal.  Left Ear: External ear normal.  Neurological: She is alert and oriented to person, place, and time. No cranial nerve deficit. She exhibits normal muscle tone. Coordination normal.  Iliopsoas 5/5 Bilateral, Tib anterior 5/5 bilateral, EHL 5/5 bilateral, no ankle clonus, intact heel/toe/sequential walking, sensation intact upper and lower extremities. Straight leg raise negative bilaterally.   Skin: Skin is warm and dry. No rash noted.  She is not diaphoretic.  Psychiatric: She has a normal mood and affect. Her behavior is normal. Judgment and thought content normal.      Assessment & Plan:

## 2015-01-31 NOTE — Progress Notes (Signed)
Pre visit review using our clinic review tool, if applicable. No additional management support is needed unless otherwise documented below in the visit note. 

## 2015-02-21 ENCOUNTER — Encounter: Payer: Self-pay | Admitting: Internal Medicine

## 2015-02-21 ENCOUNTER — Ambulatory Visit (INDEPENDENT_AMBULATORY_CARE_PROVIDER_SITE_OTHER): Payer: BLUE CROSS/BLUE SHIELD | Admitting: Internal Medicine

## 2015-02-21 VITALS — BP 110/78 | HR 81 | Temp 97.9°F | Resp 14 | Ht 64.0 in | Wt 192.8 lb

## 2015-02-21 DIAGNOSIS — M5442 Lumbago with sciatica, left side: Secondary | ICD-10-CM | POA: Diagnosis not present

## 2015-02-21 DIAGNOSIS — R635 Abnormal weight gain: Secondary | ICD-10-CM

## 2015-02-21 DIAGNOSIS — Z720 Tobacco use: Secondary | ICD-10-CM

## 2015-02-21 DIAGNOSIS — K219 Gastro-esophageal reflux disease without esophagitis: Secondary | ICD-10-CM

## 2015-02-21 MED ORDER — GABAPENTIN 100 MG PO CAPS: 100.0000 mg | ORAL_CAPSULE | Freq: Three times a day (TID) | ORAL | Status: DC

## 2015-02-21 MED ORDER — OMEPRAZOLE 40 MG PO CPDR: 40.0000 mg | DELAYED_RELEASE_CAPSULE | Freq: Every day | ORAL | Status: DC

## 2015-02-21 MED ORDER — HYDROCODONE-ACETAMINOPHEN 5-325 MG PO TABS: 1.0000 | ORAL_TABLET | Freq: Four times a day (QID) | ORAL | Status: DC | PRN

## 2015-02-21 MED ORDER — PREDNISONE 10 MG PO TABS: ORAL_TABLET | ORAL | Status: DC

## 2015-02-21 MED ORDER — PHENTERMINE HCL 37.5 MG PO TABS: ORAL_TABLET | ORAL | Status: DC

## 2015-02-21 NOTE — Patient Instructions (Addendum)
We are repeating the prednisone taper and adding gabapentin at night and vicodin up to 2 daily   Increase your omeprazole to two times daily OR ADD FAMOTIDINE 20 MG IN THE LATE AFTERNOON,  BEFORE DINNER   Stop the hydroxycut    I have authorized the use of phentermine for 3 months.  Please have your vital signs checked a week after starting, and return to see me in 3 months  Take 1/2 tablet in the morning,  The second half by 2 PM to avoid insomnia.  Or take the whole tablet in the morning,  Goal weight loss is  5% of your starting weight by the end of the  3 months   For you ,  Based on today's reading,  10  lbs

## 2015-02-21 NOTE — Progress Notes (Signed)
Subjective:  Patient ID: Colleen Washington, female    DOB: [birth year]  Age: 54 y.o. MRN: [id number]  CC: The primary encounter diagnosis was Tobacco abuse. Diagnoses of Acute left-sided back pain with sciatica, Weight gain, and Gastroesophageal reflux disease without esophagitis were also pertinent to this visit.  HPI Colleen Washington presents for follow up on sciatica.  Seen last month by Colleen Washington and treated with a prednisone taper .  Last plain films suggested  disk herniation at L5 in August 2015 .  She has had no appreciable change in pain with the  prednisone taper.  TTried adding Salon Pas and muscle relaxer, and adjusted her sleep number bed  Tobacco abuse:  She has quit smoking.  She initially ttied the nocitne patches,  Made her heart race even at the lowest dose,  Now trying the vapor,  Has not smoked in a few weeks,  But gaining weight despite walking  And staying on a low carb diet and using hydroxycut   IBS:  Having nausea frequently .  Acid reflux occurring a lot at night   Outpatient Prescriptions Prior to Visit  Medication Sig Dispense Refill  . buPROPion (WELLBUTRIN XL) 150 MG 24 hr tablet Take 1 tablet (150 mg total) by mouth daily. 90 tablet 2  . busPIRone (BUSPAR) 5 MG tablet Take 1 tablet (5 mg total) by mouth 3 (three) times daily. 90 tablet 2  . cetirizine (ZYRTEC) 10 MG tablet Take 10 mg by mouth daily.    [name] dicyclomine (BENTYL) 20 MG tablet Take 1 tablet (20 mg total) by mouth 4 (four) times daily -  before meals and at bedtime. 90 tablet 1  . fluticasone (FLONASE) 50 MCG/ACT nasal spray Place 2 sprays into both nostrils daily. 16 g 4  . hyoscyamine (LEVSIN SL) 0.125 MG SL tablet Place 1 tablet (0.125 mg total) under the tongue every 4 (four) hours as needed. 30 tablet 0  . ondansetron (ZOFRAN) 4 MG tablet Take 1 tablet (4 mg total) by mouth every 8 (eight) hours as needed for nausea or vomiting. 30 tablet 1  . Vitamin D, Ergocalciferol, (DRISDOL) 50000 UNITS CAPS capsule Take 1  capsule (50,000 Units total) by mouth every 7 (seven) days. 5 capsule 3  . omeprazole (PRILOSEC) 40 MG capsule Take 1 capsule (40 mg total) by mouth daily. 90 capsule 3  . fluconazole (DIFLUCAN) 150 MG tablet Take 1 tablet (150 mg total) by mouth daily. (Patient not taking: Reported on 01/31/2015) 2 tablet 0  . nicotine (NICODERM CQ - DOSED IN MG/24 HR) 7 mg/24hr patch Place 1 patch (7 mg total) onto the skin daily. (Patient not taking: Reported on 02/21/2015) 28 patch 0  . predniSONE (DELTASONE) 10 MG tablet Take 6 tablets by mouth on day 1 with breakfast then decrease by 1 tablet each day until gone. 21 tablet 0   No facility-administered medications prior to visit.    Review of Systems;  Patient denies headache, fevers, malaise, unintentional weight loss, skin rash, eye pain, sinus congestion and sinus pain, sore throat, dysphagia,  hemoptysis , cough, dyspnea, wheezing, chest pain, palpitations, orthopnea, edema, abdominal pain, nausea, melena, diarrhea, constipation, flank pain, dysuria, hematuria, urinary  Frequency, nocturia, numbness, tingling, seizures,  Focal weakness, Loss of consciousness,  Tremor, insomnia, depression, anxiety, and suicidal ideation.      Objective:  BP 110/78 mmHg  Pulse 81  Temp(Src) 97.9 F (36.6 C) (Oral)  Resp 14  Ht 5\' 4"  (1.626 m)  Wt  192 lb 12 oz (87.431 kg)  BMI 33.07 kg/m2  SpO2 98%  BP Readings from Last 3 Encounters:  02/21/15 110/78  01/31/15 118/82  10/19/14 122/82    Wt Readings from Last 3 Encounters:  02/21/15 192 lb 12 oz (87.431 kg)  01/31/15 194 lb (87.998 kg)  10/19/14 189 lb (85.73 kg)    General appearance: alert, cooperative and appears stated age Ears: normal TM's and external ear canals both ears Throat: lips, mucosa, and tongue normal; teeth and gums normal Neck: no adenopathy, no carotid bruit, supple, symmetrical, trachea midline and thyroid not enlarged, symmetric, no tenderness/mass/nodules Back: symmetric, no  curvature. ROM normal. No CVA tenderness. Lungs: clear to auscultation bilaterally Heart: regular rate and rhythm, S1, S2 normal, no murmur, click, rub or gallop Abdomen: soft, non-tender; bowel sounds normal; no masses,  no organomegaly Pulses: 2+ and symmetric Skin: Skin color, texture, turgor normal. No rashes or lesions Lymph nodes: Cervical, supraclavicular, and axillary nodes normal.  No results found for: HGBA1C  Lab Results  Component Value Date   CREATININE 0.79 08/31/2014   CREATININE 0.7 11/15/2013    Lab Results  Component Value Date   WBC 4.9 08/31/2014   HGB 13.8 08/31/2014   HCT 39.9 08/31/2014   PLT 191.0 08/31/2014   GLUCOSE 90 08/31/2014   CHOL 223* 08/31/2014   TRIG 108.0 08/31/2014   HDL 46.60 08/31/2014   LDLCALC 155* 08/31/2014   ALT 13 08/31/2014   AST 15 08/31/2014   NA 139 08/31/2014   K 4.5 08/31/2014   CL 107 08/31/2014   CREATININE 0.79 08/31/2014   BUN 8 08/31/2014   CO2 26 08/31/2014   TSH 1.18 08/31/2014    No results found.  Assessment & Plan:   Problem List Items Addressed This Visit      Unprioritized   Tobacco abuse - Primary    Smoking cessation instruction/counseling given:  commended patient for quitting and reviewed strategies for preventing relapses      GERD (gastroesophageal reflux disease)    Advised to resume PPI bid       Relevant Medications   omeprazole (PRILOSEC) 40 MG capsule   Acute left-sided back pain with sciatica    Will repeat prednisone taper and add  vicodin and gabapentin. .       Relevant Medications   predniSONE (DELTASONE) 10 MG tablet   HYDROcodone-acetaminophen (NORCO/VICODIN) 5-325 MG per tablet   Weight gain    Secondary to tobacco cessation.  She has had difficulty losing weight due to increased appetite and is requesting a trial of  Phentermine.  She is aware of the possible side effects and risks and understands that    The medication will be discontinued if she has not lost 5% of her  body weight over the next 3 months, which , based on today's weight is 10 lbs.         I have discontinued [name]'s nicotine, fluconazole, and predniSONE. I am also having her start on predniSONE, gabapentin, HYDROcodone-acetaminophen, and phentermine. Additionally, I am having her maintain her busPIRone, cetirizine, ondansetron, dicyclomine, hyoscyamine, fluticasone, buPROPion, Vitamin D (Ergocalciferol), and omeprazole.  Meds ordered this encounter  Medications  . predniSONE (DELTASONE) 10 MG tablet    Sig: 6 tablets on Day 1 , then reduce by 1 tablet daily until gone    Dispense:  21 tablet    Refill:  0  . gabapentin (NEURONTIN) 100 MG capsule    Sig: Take 1 capsule (100 mg  total) by mouth 3 (three) times daily.    Dispense:  90 capsule    Refill:  3  . HYDROcodone-acetaminophen (NORCO/VICODIN) 5-325 MG per tablet    Sig: Take 1 tablet by mouth every 6 (six) hours as needed for moderate pain. Maximum two daily    Dispense:  60 tablet    Refill:  0  . phentermine (ADIPEX-P) 37.5 MG tablet    Sig: 1/2 tablet in the am and early afternoon    Dispense:  30 tablet    Refill:  2  . omeprazole (PRILOSEC) 40 MG capsule    Sig: Take 1 capsule (40 mg total) by mouth daily.    Dispense:  90 capsule    Refill:  3   A total of 25 minutes of face to face time was spent with patient more than half of which was spent in counselling about the above mentioned conditions  and coordination of care  Medications Discontinued During This Encounter  Medication Reason  . predniSONE (DELTASONE) 10 MG tablet Completed Course  . nicotine (NICODERM CQ - DOSED IN MG/24 HR) 7 mg/24hr patch Patient Preference  . fluconazole (DIFLUCAN) 150 MG tablet Completed Course  . fluconazole (DIFLUCAN) 150 MG tablet Completed Course  . nicotine (NICODERM CQ - DOSED IN MG/24 HR) 7 mg/24hr patch Patient Preference  . omeprazole (PRILOSEC) 40 MG capsule Reorder    Follow-up: Return in about 3 months (around  05/24/2015).   Crecencio Mc, MD

## 2015-02-21 NOTE — Progress Notes (Signed)
Pre-visit discussion using our clinic review tool. No additional management support is needed unless otherwise documented below in the visit note.  

## 2015-02-22 DIAGNOSIS — R635 Abnormal weight gain: Secondary | ICD-10-CM | POA: Insufficient documentation

## 2015-02-22 NOTE — Assessment & Plan Note (Signed)
Will repeat prednisone taper and add  vicodin and gabapentin. [name]

## 2015-02-22 NOTE — Assessment & Plan Note (Signed)
Advised to resume PPI bid

## 2015-02-22 NOTE — Assessment & Plan Note (Signed)
Secondary to tobacco cessation.  She has had difficulty losing weight due to increased appetite and is requesting a trial of  Phentermine.  She is aware of the possible side effects and risks and understands that    The medication will be discontinued if she has not lost 5% of her body weight over the next 3 months, which , based on today's weight is 10 lbs.

## 2015-02-22 NOTE — Assessment & Plan Note (Signed)
Smoking cessation instruction/counseling given:  commended patient for quitting and reviewed strategies for preventing relapses 

## 2015-03-15 ENCOUNTER — Telehealth: Payer: Self-pay | Admitting: *Deleted

## 2015-03-15 MED ORDER — FLUCONAZOLE 150 MG PO TABS: 150.0000 mg | ORAL_TABLET | Freq: Every day | ORAL | Status: DC

## 2015-03-15 NOTE — Telephone Encounter (Signed)
Pt called requesting a Rx for yeast infection.  States Monistat burned. Please advise

## 2015-03-15 NOTE — Telephone Encounter (Signed)
Fluconazole rx sent ,  Take daily for 2 day s

## 2015-03-15 NOTE — Telephone Encounter (Signed)
Left message on VM advising Rx has been sent.

## 2015-05-05 ENCOUNTER — Ambulatory Visit: Payer: Self-pay | Admitting: Physician Assistant

## 2015-05-05 ENCOUNTER — Encounter: Payer: Self-pay | Admitting: Physician Assistant

## 2015-05-05 VITALS — BP 118/86 | Temp 98.4°F | Wt 192.0 lb

## 2015-05-05 DIAGNOSIS — J018 Other acute sinusitis: Secondary | ICD-10-CM

## 2015-05-05 MED ORDER — FLUCONAZOLE 150 MG PO TABS: 150.0000 mg | ORAL_TABLET | ORAL | Status: DC

## 2015-05-05 MED ORDER — AMOXICILLIN 875 MG PO TABS: 875.0000 mg | ORAL_TABLET | Freq: Two times a day (BID) | ORAL | Status: DC

## 2015-05-05 MED ORDER — PREDNISONE 10 MG PO TABS: 30.0000 mg | ORAL_TABLET | Freq: Every day | ORAL | Status: DC

## 2015-05-05 NOTE — Progress Notes (Signed)
S: C/o runny nose and congestion for 3 days, headache, had dental work done last week and sx started afterwards, flu shot about 2 weeks ago,  no fever, chills, cp/sob, v/d;  c/o of facial pain, no tooth pain  Using otc meds:   O: PE: vitals wnl, nad,  perrl eomi, normocephalic, tms dull, nasal mucosa red and swollen, throat injected, neck supple no lymph, lungs c t a, cv rrr, neuro intact  A:  Acute sinusitis   P: amoxil 875mg  bid x 10d, flonase, diflucan,  drink fluids, continue regular meds , use otc meds of choice, return if not improving in 5 days, return earlier if worsening

## 2015-05-10 ENCOUNTER — Ambulatory Visit: Payer: Self-pay | Admitting: Physician Assistant

## 2015-05-10 ENCOUNTER — Encounter: Payer: Self-pay | Admitting: Physician Assistant

## 2015-05-10 VITALS — BP 112/80 | Temp 98.4°F

## 2015-05-10 DIAGNOSIS — H9202 Otalgia, left ear: Secondary | ICD-10-CM

## 2015-05-10 NOTE — Progress Notes (Signed)
S: taking amoxil,using multiple otc meds to help with pressure and ear pain, would just like advice, no fever/chills  O: vitals wnl, nad, tms dull but improved, nasal mucosa wnl, throat wnl, neck supple no lymph, lungs c t a, cv rrr  A: ear pain  P:otc measures, reassurance

## 2015-05-15 ENCOUNTER — Other Ambulatory Visit: Payer: Self-pay | Admitting: Nurse Practitioner

## 2015-05-16 ENCOUNTER — Encounter: Payer: Self-pay | Admitting: Obstetrics and Gynecology

## 2015-05-16 NOTE — Telephone Encounter (Signed)
Do you still want her to take this much Vitamin D? Please advise

## 2015-05-24 ENCOUNTER — Encounter: Payer: Self-pay | Admitting: Internal Medicine

## 2015-05-24 ENCOUNTER — Ambulatory Visit (INDEPENDENT_AMBULATORY_CARE_PROVIDER_SITE_OTHER): Payer: BLUE CROSS/BLUE SHIELD | Admitting: Internal Medicine

## 2015-05-24 VITALS — BP 110/80 | HR 92 | Temp 98.0°F | Resp 18 | Ht 64.0 in | Wt 190.0 lb

## 2015-05-24 DIAGNOSIS — E559 Vitamin D deficiency, unspecified: Secondary | ICD-10-CM

## 2015-05-24 DIAGNOSIS — N951 Menopausal and female climacteric states: Secondary | ICD-10-CM

## 2015-05-24 DIAGNOSIS — R5383 Other fatigue: Secondary | ICD-10-CM

## 2015-05-24 DIAGNOSIS — M5442 Lumbago with sciatica, left side: Secondary | ICD-10-CM | POA: Diagnosis not present

## 2015-05-24 DIAGNOSIS — E669 Obesity, unspecified: Secondary | ICD-10-CM | POA: Diagnosis not present

## 2015-05-24 MED ORDER — OMEPRAZOLE 40 MG PO CPDR: 40.0000 mg | DELAYED_RELEASE_CAPSULE | Freq: Two times a day (BID) | ORAL | Status: DC

## 2015-05-24 MED ORDER — BUSPIRONE HCL 5 MG PO TABS: 5.0000 mg | ORAL_TABLET | Freq: Three times a day (TID) | ORAL | Status: DC

## 2015-05-24 MED ORDER — ALBUTEROL SULFATE HFA 108 (90 BASE) MCG/ACT IN AERS: 2.0000 | INHALATION_SPRAY | Freq: Four times a day (QID) | RESPIRATORY_TRACT | Status: DC | PRN

## 2015-05-24 MED ORDER — BUPROPION HCL ER (XL) 150 MG PO TB24: 150.0000 mg | ORAL_TABLET | Freq: Every day | ORAL | Status: DC

## 2015-05-24 NOTE — Patient Instructions (Addendum)
Referral to PT for help in planning an exercise regimen that won't hurt your back pain   Referral to nutritionist to help you lose weight    Suspend the phentermine  For now     The  diet I discussed with you today is the 10 day Green Smoothie Cleansing /Detox Diet by Linden Dolin . available on [place] for around $10.  This is not a low carb or a weight loss diet,  It is fundamentally a "cleansing" low fat diet that eliminates sugar, gluten, caffeine, alcohol and dairy for 10 days .  What you add back after the initial ten days is entirely up to  you!  You can expect to lose 5 to 10 lbs depending on how strict you are.   I found that  drinking 2 smoothies or juices  daily and keeping one chewable meal (but keep it simple, like baked fish and salad, rice or bok choy) kept me satisfied and kept me from straying  .  You snack primarily on fresh  fruit, egg whites and judicious quantities of nuts. I  Recommend adding a  vegetable based protein powder  To any smoothie made with almond milk.  (nothing with whey , since whey is dairy) in it.  [employer] has a Research officer, political party .   It does require some form of a nutrient extractor (Vita Mix, a electric juicer,  Or a Nutribullet Rx).  i have found that using frozen fruits is much more convenient and cost effective. You can even find plenty of organic fruit in the frozen fruit section of [employer].  Just thaw what you need for the following day the night before in the refrigerator (to avoid jamming up your machine)    The organic greens drink I use if I don't have any fresh greens  is called "Suja" and it's sold in the vegetable refrigerated section of most grocery stores (including [facility])  . It is tart, though, so be careful (has lemon juice in it )  The organic vegan protein powder I tried  is called Vega" and I found it at [employer] .  It is sugar free  Your other choice is a low carb diet.   This is  my version of a  "Low GI"  Diet:  It will  allow you to  lose 4 to 8  lbs  per month if you follow it carefully.  Your goal with exercise is a minimum of 30 minutes of aerobic exercise 5 days per week (Walking does not count once it becomes easy!)     All of the foods can be found at grocery stores and in bulk at [employer].  The Atkins protein bars and shakes are available in more varieties at [employer], [employer] and [place].     7 AM Breakfast:  Choose from the following:  Low carbohydrate Protein  Shakes (I recommend the EAS AdvantEdge "Carb Control" shakes  Or the low carb shakes by Atkins.    2.5 carbs   Arnold's "Sandwhich Thin"toasted  w/ peanut butter (no jelly: about 20 net carbs  "Bagel Thin" with cream cheese and salmon: about 20 carbs   a scrambled egg/bacon/cheese burrito made with Mission's "carb balance" whole wheat tortilla  (about 10 net carbs )  A slice of home made fritatta (egg based dish without a crust:  google it)    Avoid cereal and bananas, oatmeal and cream of wheat and grits. They are loaded with carbohydrates!  10 AM: high protein snack  Protein bar by Atkins (the snack size, under 200 cal, usually < 6 net carbs).    A stick of cheese:  Around 1 carb,  100 cal     Dannon Light n Fit [place] Yogurt  (80 cal, 8 carbs)  Other so called "protein bars" and Greek yogurts tend to be loaded with carbohydrates.  Remember, in food advertising, the word "energy" is synonymous for " carbohydrate."  Lunch:   A Sandwich using the bread choices listed, Can use any  Eggs,  lunchmeat, grilled meat or canned tuna), avocado, regular mayo/mustard  and cheese.  A Salad using blue cheese, ranch,  Goddess or vinagrette,  No croutons or "confetti" and no "candied nuts" but regular nuts OK.   No pretzels or chips.  Pickles and miniature sweet peppers are a good low carb alternative that provide a "crunch"  The bread is the only source of carbohydrate in a sandwich and  can be decreased by trying some of these alternatives to traditional loaf  bread:  Joseph's makes a pita bread and a flat bread (called "Lavash") that are 50 cal and 4 net carbs available at [employer], [employer].  This can be toasted to use with hummous as well  Toufayan makes a low carb flatbread that's 100 cal and 9 net carbs available at [employer] and [employer] makes 2 sizes of  Low carb whole wheat tortilla  (The large one is 210 cal and 6 net carbs)  Flat Out makes flatbreads that are low carb as well  aa folded flatbread called "Foldit" available at [place] "Low fat dressings, as well as [place] dressings They are loaded with sugar.  Ranch,  [employer], Goddess,  vinagrettes are all fine   3 PM/ Mid day  Snack:  Consider  1 ounce of  almonds, walnuts, pistachios, pecans, peanuts,  Macadamia nuts or a nut medley.  Avoid "granola"; the dried cranberries and raisins are loaded with carbohydrates. Mixed nuts as long as there are no raisins,  cranberries or dried fruit.    Try the prosciutto/mozzarella cheese sticks by F[employer]ci  In deli /backery section   High protein   To avoid overindulging in snacks: Try drinking a glass of almond coconut milk light by Sil[place] ( Or a cup of coffee with your Atkins chocolate bar to keep you from having 3!!!   Pork rinds!  Yes Pork Rinds        6 PM  Dinner:     Meat/fowl/fish with a green salad, and either broccoli, cauliflower, green beans, spinach, brussel sprouts or  Lima beans. DO NOT BREAD THE PROTEIN!!      There is a low carb pasta by Dreamfield's that is acceptable and tastes great: only 5 digestible carbs/serving.( All grocery stores but BJs carry it )  There are frozen dinners that are low carb:  Try Michel Angelo's chicken piccata or chicken or eggplant parm over low carb pasta.(Lowes and BJs)   Marjory Lies Sanchez's "Carnitas" (pulled pork, no sauce,  0 carbs) or his beef pot roast to make a dinner burrito (at [facility])  Pesto over low carb pasta (bj's sells a good quality pesto in  the center refrigerated section of the deli   Try satueeing  Cheral Marker with mushroooms  Whole wheat pasta is still full of digestible carbs and  Not as low in glycemic index as Dreamfield's.   [place] Shark  rice is still rice,  So skip the rice and noodles if you eat [place] or [place] (or at least limit to 1/2 cup)  9 PM snack :   Breyer's "low carb" fudgsicle or  ice cream bar (Carb Smart line), or  Weight Watcher's ice cream bar , or another "no sugar added" ice cream;  a serving of fresh berries/cherries with whipped cream   Cheese or DANNON'S LlGHT N FIT GREEK YOGURT or the [place] greek yogurt   8 ounces of [place] unsweetened almond/cococunut milk  Cheese and crackers (using WASA crackers,  They are low carb) or peanut butter on low carb crackers or pita bread     Avoid bananas, pineapple, grapes  and watermelon on a regular basis because they are high in sugar.  THINK OF THEM AS DESSERT  Remember that snack Substitutions should be less than 10 NET carbs per serving and meals should be < 25 net carbs. Remember that carbohydrates from fiber do not affect blood sugar, so you can  subtract fiber grams to get the "net carbs " of any particular food item.

## 2015-05-24 NOTE — Progress Notes (Signed)
Subjective:  Patient ID: Colleen Washington, female    DOB: 20-Sep-1960  Age: 54 y.o. MRN: [id number]  CC: The primary encounter diagnosis was Vitamin D deficiency. Diagnoses of Other fatigue, Left-sided low back pain with left-sided sciatica, Obesity, and Symptoms, such as flushing, sleeplessness, headache, lack of concentration, associated with the menopause were also pertinent to this visit.  HPI Colleen Washington presents for follow up on chronic conditions including obesity with recent initiation of phentermine, sciatica, left sided,  hypertension,  Depression with panic disorder, and   IBS.[name]  She has been following a low GI diet and taking the phentermine sporidcall.  Diet reviewed.  Drinking hte low carb Premier Protein shakes at 8am , having another at lunch with a salad,   And having a salad with grilled chicken for dinner  .  Despite Using phentermine , she has only lost 4 lbs . Used it daily for a few weeks  Then suspended it while sick with otitis media and stopped it for 3-4 weeks..  Not exercisig regularly due to her sciatica .    Discussed referral to PT for sciatica and exercises   Has been very emotional lately due to menopause .  has not been taking buspirone, just the wellbutrin.   Outpatient Prescriptions Prior to Visit  Medication Sig Dispense Refill  . cetirizine (ZYRTEC) 10 MG tablet Take 10 mg by mouth daily.    [name] dicyclomine (BENTYL) 20 MG tablet Take 1 tablet (20 mg total) by mouth 4 (four) times daily -  before meals and at bedtime. 90 tablet 1  . fluticasone (FLONASE) 50 MCG/ACT nasal spray Place 2 sprays into both nostrils daily. 16 g 4  . gabapentin (NEURONTIN) 100 MG capsule Take 1 capsule (100 mg total) by mouth 3 (three) times daily. 90 capsule 3  . hyoscyamine (LEVSIN SL) 0.125 MG SL tablet Place 1 tablet (0.125 mg total) under the tongue every 4 (four) hours as needed. 30 tablet 0  . phentermine (ADIPEX-P) 37.5 MG tablet 1/2 tablet in the am and early afternoon 30 tablet  2  . promethazine (PHENERGAN) 12.5 MG tablet Take 12.5 mg by mouth every 6 (six) hours as needed for nausea or vomiting.    [name] albuterol (PROVENTIL HFA;VENTOLIN HFA) 108 (90 BASE) MCG/ACT inhaler Inhale into the lungs every 6 (six) hours as needed for wheezing or shortness of breath.    [name] buPROPion (WELLBUTRIN XL) 150 MG 24 hr tablet Take 1 tablet (150 mg total) by mouth daily. 90 tablet 2  . busPIRone (BUSPAR) 5 MG tablet Take 1 tablet (5 mg total) by mouth 3 (three) times daily. 90 tablet 2  . omeprazole (PRILOSEC) 40 MG capsule Take 1 capsule (40 mg total) by mouth daily. 90 capsule 3  . ondansetron (ZOFRAN) 4 MG tablet Take 1 tablet (4 mg total) by mouth every 8 (eight) hours as needed for nausea or vomiting. 30 tablet 1  . Vitamin D, Ergocalciferol, (DRISDOL) 50000 UNITS CAPS capsule Take 1 capsule (50,000 Units total) by mouth every 7 (seven) days. 5 capsule 3  . HYDROcodone-acetaminophen (NORCO/VICODIN) 5-325 MG per tablet Take 1 tablet by mouth every 6 (six) hours as needed for moderate pain. Maximum two daily 60 tablet 0   No facility-administered medications prior to visit.    Review of Systems;  Patient denies headache, fevers, malaise, unintentional weight loss, skin rash, eye pain, sinus congestion and sinus pain, sore throat, dysphagia,  hemoptysis , cough, dyspnea, wheezing, chest pain, palpitations,  orthopnea, edema, abdominal pain, nausea, melena, diarrhea, constipation, flank pain, dysuria, hematuria, urinary  Frequency, nocturia, numbness, tingling, seizures,  Focal weakness, Loss of consciousness,  Tremor, insomnia, depression, anxiety, and suicidal ideation.      Objective:  BP 110/80 mmHg  Pulse 92  Temp(Src) 98 F (36.7 C) (Oral)  Resp 18  Ht '5\' 4"'  (1.626 m)  Wt 190 lb (86.183 kg)  BMI 32.60 kg/m2  SpO2 96%  BP Readings from Last 3 Encounters:  05/24/15 110/80  05/10/15 112/80  05/05/15 118/86    Wt Readings from Last 3 Encounters:  05/24/15 190 lb (86.183  kg)  05/05/15 192 lb (87.091 kg)  02/21/15 192 lb 12 oz (87.431 kg)    General appearance: alert, cooperative and appears stated age Ears: normal TM's and external ear canals both ears Throat: lips, mucosa, and tongue normal; teeth and gums normal Neck: no adenopathy, no carotid bruit, supple, symmetrical, trachea midline and thyroid not enlarged, symmetric, no tenderness/mass/nodules Back: symmetric, no curvature. ROM normal. No CVA tenderness. Lungs: clear to auscultation bilaterally Heart: regular rate and rhythm, S1, S2 normal, no murmur, click, rub or gallop Abdomen: soft, non-tender; bowel sounds normal; no masses,  no organomegaly Pulses: 2+ and symmetric Skin: Skin color, texture, turgor normal. No rashes or lesions Lymph nodes: Cervical, supraclavicular, and axillary nodes normal.  No results found for: HGBA1C  Lab Results  Component Value Date   CREATININE 0.81 05/24/2015   CREATININE 0.79 08/31/2014   CREATININE 0.7 11/15/2013    Lab Results  Component Value Date   WBC 4.9 08/31/2014   HGB 13.8 08/31/2014   HCT 39.9 08/31/2014   PLT 191.0 08/31/2014   GLUCOSE 72 05/24/2015   CHOL 223* 08/31/2014   TRIG 108.0 08/31/2014   HDL 46.60 08/31/2014   LDLCALC 155* 08/31/2014   ALT 14 05/24/2015   AST 18 05/24/2015   NA 136 05/24/2015   K 4.0 05/24/2015   CL 102 05/24/2015   CREATININE 0.81 05/24/2015   BUN 15 05/24/2015   CO2 25 05/24/2015   TSH 1.18 08/31/2014    No results found.  Assessment & Plan:   Problem List Items Addressed This Visit    Obesity    Continue current diet,  Advised to suspend phentermine until a regular exercise program can be maintained.   Referral to nutritionist .       Relevant Orders   Amb Referral to Nutrition and Diabetic E   Symptoms, such as flushing, sleeplessness, headache, lack of concentration, associated with the menopause    Recommended she resume the buspirone for the irritability and continue wellbutrin .        Back pain    With occasional epsiodes of sciatica secondary to mild disk herniation at L4. Referring to PT for therapy and advice on safe exercise programs.       Relevant Orders   Ambulatory referral to Physical Therapy   Vitamin D deficiency - Primary    Recurrent.  Refilling Drisdol.       Relevant Orders   Vit D  25 hydroxy (rtn osteoporosis monitoring) (Completed)    Other Visit Diagnoses    Other fatigue        Relevant Orders    Comp Met (CMET) (Completed)       A total of 25 minutes of face to face time was spent with patient more than half of which was spent in counselling about the above mentioned conditions  and coordination of care  I have discontinued Ms. Locher's ondansetron, HYDROcodone-acetaminophen, amoxicillin, fluconazole, and predniSONE. I have also changed her albuterol and omeprazole. Additionally, I am having her maintain her cetirizine, dicyclomine, hyoscyamine, fluticasone, gabapentin, phentermine, promethazine, busPIRone, buPROPion, and Vitamin D (Ergocalciferol).  Meds ordered this encounter  Medications  . albuterol (PROVENTIL HFA;VENTOLIN HFA) 108 (90 BASE) MCG/ACT inhaler    Sig: Inhale 2 puffs into the lungs every 6 (six) hours as needed for wheezing or shortness of breath.    Dispense:  3.7 g    Refill:  5  . busPIRone (BUSPAR) 5 MG tablet    Sig: Take 1 tablet (5 mg total) by mouth 3 (three) times daily.    Dispense:  90 tablet    Refill:  2  . buPROPion (WELLBUTRIN XL) 150 MG 24 hr tablet    Sig: Take 1 tablet (150 mg total) by mouth daily.    Dispense:  90 tablet    Refill:  2  . omeprazole (PRILOSEC) 40 MG capsule    Sig: Take 1 capsule (40 mg total) by mouth 2 (two) times daily.    Dispense:  180 capsule    Refill:  1  . Vitamin D, Ergocalciferol, (DRISDOL) 50000 UNITS CAPS capsule    Sig: Take 1 capsule (50,000 Units total) by mouth every 7 (seven) days.    Dispense:  12 capsule    Refill:  0    Medications Discontinued During This  Encounter  Medication Reason  . amoxicillin (AMOXIL) 875 MG tablet Completed Course  . fluconazole (DIFLUCAN) 150 MG tablet Patient has not taken in last 30 days  . HYDROcodone-acetaminophen (NORCO/VICODIN) 5-325 MG per tablet Patient has not taken in last 30 days  . predniSONE (DELTASONE) 10 MG tablet Patient has not taken in last 30 days  . ondansetron (ZOFRAN) 4 MG tablet Patient has not taken in last 30 days  . albuterol (PROVENTIL HFA;VENTOLIN HFA) 108 (90 BASE) MCG/ACT inhaler Reorder  . busPIRone (BUSPAR) 5 MG tablet Reorder  . buPROPion (WELLBUTRIN XL) 150 MG 24 hr tablet Reorder  . omeprazole (PRILOSEC) 40 MG capsule Reorder  . Vitamin D, Ergocalciferol, (DRISDOL) 50000 UNITS CAPS capsule Reorder    Follow-up: No Follow-up on file.   Crecencio Mc, MD

## 2015-05-24 NOTE — Progress Notes (Signed)
Pre-visit discussion using our clinic review tool. No additional management support is needed unless otherwise documented below in the visit note.  

## 2015-05-25 LAB — VITAMIN D 25 HYDROXY (VIT D DEFICIENCY, FRACTURES): VITD: 26.8 ng/mL — ABNORMAL LOW (ref 30.00–100.00)

## 2015-05-25 LAB — COMPREHENSIVE METABOLIC PANEL
ALT: 14 U/L (ref 0–35)
AST: 18 U/L (ref 0–37)
Albumin: 4.3 g/dL (ref 3.5–5.2)
Alkaline Phosphatase: 55 U/L (ref 39–117)
BUN: 15 mg/dL (ref 6–23)
CO2: 25 mEq/L (ref 19–32)
Calcium: 9.7 mg/dL (ref 8.4–10.5)
Chloride: 102 mEq/L (ref 96–112)
Creatinine, Ser: 0.81 mg/dL (ref 0.40–1.20)
GFR: 78.57 mL/min (ref >60.00)
Glucose, Bld: 72 mg/dL (ref 70–99)
Potassium: 4 mEq/L (ref 3.5–5.1)
Sodium: 136 mEq/L (ref 135–145)
Total Bilirubin: 0.6 mg/dL (ref 0.2–1.2)
Total Protein: 7.3 g/dL (ref 6.0–8.3)

## 2015-05-26 ENCOUNTER — Encounter: Payer: Self-pay | Admitting: Internal Medicine

## 2015-05-26 DIAGNOSIS — E559 Vitamin D deficiency, unspecified: Secondary | ICD-10-CM | POA: Insufficient documentation

## 2015-05-26 HISTORY — DX: Vitamin D deficiency, unspecified: E55.9

## 2015-05-26 MED ORDER — VITAMIN D (ERGOCALCIFEROL) 1.25 MG (50000 UNIT) PO CAPS: 50000.0000 [IU] | ORAL_CAPSULE | ORAL | Status: DC

## 2015-05-26 NOTE — Assessment & Plan Note (Addendum)
With occasional epsiodes of sciatica secondary to mild disk herniation at L4. Referring to PT for therapy and advice on safe exercise programs.

## 2015-05-26 NOTE — Assessment & Plan Note (Addendum)
Continue current diet,  Advised to suspend phentermine until a regular exercise program can be maintained.   Referral to nutritionist .

## 2015-05-26 NOTE — Assessment & Plan Note (Signed)
Recurrent.  Refilling Drisdol.

## 2015-05-26 NOTE — Assessment & Plan Note (Signed)
Recommended she resume the buspirone for the irritability and continue wellbutrin .

## 2015-05-28 ENCOUNTER — Encounter: Payer: Self-pay | Admitting: Internal Medicine

## 2015-06-14 ENCOUNTER — Ambulatory Visit: Payer: Self-pay | Admitting: Family Medicine

## 2015-06-14 ENCOUNTER — Telehealth: Payer: Self-pay | Admitting: *Deleted

## 2015-06-14 ENCOUNTER — Ambulatory Visit: Payer: Self-pay | Admitting: Physician Assistant

## 2015-06-14 ENCOUNTER — Encounter: Payer: Self-pay | Admitting: Physician Assistant

## 2015-06-14 VITALS — BP 114/90 | HR 76 | Temp 98.1°F

## 2015-06-14 DIAGNOSIS — M549 Dorsalgia, unspecified: Secondary | ICD-10-CM

## 2015-06-14 NOTE — Telephone Encounter (Signed)
Patient has requested a Rx for prednisone, due to her sciatica  flare up in her left leg. Patient stated that she has used her ted hose , taken ibuprofen and nothing seem to work. Patient stated she was prescribed prednisone before for this issue and it worked .

## 2015-06-14 NOTE — Progress Notes (Signed)
S:  C/o low back pain for 1 day, no known injury, pain is worse with movement, increased with bending over, felt fine then went to chiropractor and he popped her back, pain radiates down left leg, denies numbness, tingling, or changes in bowel/urinary habits,  Using otc meds without relief Remainder ros neg  O:  Vitals wnl, nad, lungs c t a, cv rrr, spine nontender, muscles in lower back normal ,  Neg slr, pt walks without difficulty, no foot drop noted, n/v intact  A: acute back pain,  P: use wet heat followed by ice, stretches, return to clinic if not better in 3 t 5 days, return earlier if worsening, use otc turmeric, aleve

## 2015-06-14 NOTE — Telephone Encounter (Signed)
appt scheduled

## 2015-06-19 ENCOUNTER — Ambulatory Visit: Payer: Self-pay | Admitting: Physician Assistant

## 2015-06-19 ENCOUNTER — Encounter: Payer: Self-pay | Admitting: Physician Assistant

## 2015-06-19 VITALS — BP 110/80 | HR 100 | Temp 98.1°F

## 2015-06-19 DIAGNOSIS — J00 Acute nasopharyngitis [common cold]: Secondary | ICD-10-CM

## 2015-06-19 NOTE — Progress Notes (Signed)
S: C/o runny nose and congestion for 3 days, no fever, chills, cp/sob, v/d; mucus was green this am, cough occasionally, using otc advil cold sinus and delsym  O: PE: vitals wnl, nad, appears well, perrl eomi, normocephalic, tms dull, nasal mucosa red and swollen, throat injected, neck supple no lymph, lungs c t a, cv rrr, neuro intact  A:  Acute viral uri   P: drink fluids, continue regular meds , use otc meds of choice, return if not improving in 5 days, return earlier if worsening, will consider an antibiotic if not better by FRiday 12/9

## 2015-06-29 ENCOUNTER — Encounter: Payer: Self-pay | Admitting: Obstetrics and Gynecology

## 2015-07-21 ENCOUNTER — Other Ambulatory Visit: Payer: Self-pay | Admitting: Internal Medicine

## 2015-07-21 ENCOUNTER — Telehealth: Payer: Self-pay | Admitting: *Deleted

## 2015-07-21 NOTE — Telephone Encounter (Signed)
Patient has requested a medication refill for Zofran.  Pharmacy is [facility]  [phone]

## 2015-07-21 NOTE — Telephone Encounter (Signed)
Please advise refill on this medication.  Thanks

## 2015-07-21 NOTE — Telephone Encounter (Signed)
There is a pended order for this already.

## 2015-07-21 NOTE — Telephone Encounter (Signed)
Ok to refill,  Refill sent  

## 2015-08-10 ENCOUNTER — Ambulatory Visit (INDEPENDENT_AMBULATORY_CARE_PROVIDER_SITE_OTHER): Payer: BLUE CROSS/BLUE SHIELD | Admitting: Obstetrics and Gynecology

## 2015-08-10 ENCOUNTER — Encounter: Payer: Self-pay | Admitting: Obstetrics and Gynecology

## 2015-08-10 VITALS — BP 120/82 | HR 84 | Ht 67.0 in | Wt 192.5 lb

## 2015-08-10 DIAGNOSIS — E669 Obesity, unspecified: Secondary | ICD-10-CM | POA: Insufficient documentation

## 2015-08-10 DIAGNOSIS — Z0000 Encounter for general adult medical examination without abnormal findings: Secondary | ICD-10-CM

## 2015-08-10 DIAGNOSIS — F419 Anxiety disorder, unspecified: Secondary | ICD-10-CM | POA: Diagnosis not present

## 2015-08-10 DIAGNOSIS — Z01419 Encounter for gynecological examination (general) (routine) without abnormal findings: Secondary | ICD-10-CM

## 2015-08-10 DIAGNOSIS — Z90710 Acquired absence of both cervix and uterus: Secondary | ICD-10-CM | POA: Diagnosis not present

## 2015-08-10 DIAGNOSIS — Z1239 Encounter for other screening for malignant neoplasm of breast: Secondary | ICD-10-CM

## 2015-08-10 DIAGNOSIS — Z1211 Encounter for screening for malignant neoplasm of colon: Secondary | ICD-10-CM

## 2015-08-10 DIAGNOSIS — N809 Endometriosis, unspecified: Secondary | ICD-10-CM | POA: Insufficient documentation

## 2015-08-10 DIAGNOSIS — R638 Other symptoms and signs concerning food and fluid intake: Secondary | ICD-10-CM | POA: Diagnosis not present

## 2015-08-10 DIAGNOSIS — E66811 Obesity, class 1: Secondary | ICD-10-CM | POA: Insufficient documentation

## 2015-08-10 NOTE — Progress Notes (Signed)
Patient ID: TANAYIA THEARD, female   DOB: 10-25-1960, 55 y.o.   MRN: [id number] ANNUAL PREVENTATIVE CARE GYN  ENCOUNTER NOTE  Subjective:       TIERSA MILANI is a 55 y.o. No obstetric history on file. female here for a routine annual gynecologic exam.  Current complaints: 1.  Weight gain ; Patient has actually had a 2 pound weight loss over the past year   Gynecologic History History of endometriosis; asymptomatic No LMP recorded. Patient has had a hysterectomy. Contraception: status post hysterectomy Status post TVH USO for endometriosis Last Pap: 02/2013 neg/neg. Results were: normal Last mammogram: 08/31/2014 wnl. Results were: normal  Obstetric History Para 2012  Past Medical History  Diagnosis Date  . allergic rhinitis   . Depression   . History of diverticulitis of colon   . GERD (gastroesophageal reflux disease)   . Hyperlipidemia   . Irritable bowel syndrome   . H/O migraine   . Hypertension   . Herniated intervertebral disc   . Endometriosis   . IBS (irritable bowel syndrome)   . AR (allergic rhinitis)   . Anxiety     Past Surgical History  Procedure Laterality Date  . Abdominal hysterectomy  2003    tvhuso    Current Outpatient Prescriptions on File Prior to Visit  Medication Sig Dispense Refill  . albuterol (PROVENTIL HFA;VENTOLIN HFA) 108 (90 BASE) MCG/ACT inhaler Inhale 2 puffs into the lungs every 6 (six) hours as needed for wheezing or shortness of breath. 3.7 g 5  . buPROPion (WELLBUTRIN XL) 150 MG 24 hr tablet Take 1 tablet (150 mg total) by mouth daily. 90 tablet 2  . busPIRone (BUSPAR) 5 MG tablet Take 1 tablet (5 mg total) by mouth 3 (three) times daily. 90 tablet 2  . cetirizine (ZYRTEC) 10 MG tablet Take 10 mg by mouth daily.    [name] dicyclomine (BENTYL) 20 MG tablet Take 1 tablet (20 mg total) by mouth 4 (four) times daily -  before meals and at bedtime. 90 tablet 1  . fluticasone (FLONASE) 50 MCG/ACT nasal spray Place 2 sprays into both nostrils daily. 16  g 4  . gabapentin (NEURONTIN) 100 MG capsule Take 1 capsule (100 mg total) by mouth 3 (three) times daily. 90 capsule 3  . hyoscyamine (LEVSIN SL) 0.125 MG SL tablet Place 1 tablet (0.125 mg total) under the tongue every 4 (four) hours as needed. 30 tablet 0  . omeprazole (PRILOSEC) 40 MG capsule Take 1 capsule (40 mg total) by mouth 2 (two) times daily. 180 capsule 1  . ondansetron (ZOFRAN) 4 MG tablet TAKE ONE TABLET EVERY 8 HOURS AS NEEDED FOR NAUSEA AND VOMITING 30 tablet 1  . phentermine (ADIPEX-P) 37.5 MG tablet 1/2 tablet in the am and early afternoon 30 tablet 2  . promethazine (PHENERGAN) 12.5 MG tablet Take 12.5 mg by mouth every 6 (six) hours as needed for nausea or vomiting.    . Vitamin D, Ergocalciferol, (DRISDOL) 50000 UNITS CAPS capsule Take 1 capsule (50,000 Units total) by mouth every 7 (seven) days. 12 capsule 0   No current facility-administered medications on file prior to visit.    Allergies  Allergen Reactions  . Compazine [Prochlorperazine] Swelling    Social History   Social History  . Marital Status: Married    Spouse Name: N/A  . Number of Children: N/A  . Years of Education: N/A   Occupational History  . Not on file.   Social History Main Topics  .  Smoking status: Former Smoker -- 0.00 packs/day for 20 years    Types: Cigarettes  . Smokeless tobacco: Never Used  . Alcohol Use: 0.0 oz/week    0 Standard drinks or equivalent per week     Comment: occas  . Drug Use: No  . Sexual Activity: Yes    Birth Control/ Protection: Surgical   Other Topics Concern  . Not on file   Social History Narrative    Family History  Problem Relation Age of Onset  . Arthritis Mother   . Hyperlipidemia Mother   . Cancer Father     colon  . Arthritis Maternal Grandmother   . Hyperlipidemia Maternal Grandfather   . Diabetes Brother   . Heart disease Neg Hx   . Breast cancer Neg Hx   . Colon cancer Neg Hx     The following portions of the patient's history  were reviewed and updated as appropriate: allergies, current medications, past family history, past medical history, past social history, past surgical history and problem list.  Review of Systems ROS Review of Systems - General ROS: negative for - chills, fatigue, fever, hot flashes, night sweats, weight gain or weight loss Psychological ROS: negative for - anxiety, decreased libido, depression, mood swings, physical abuse or sexual abuse Ophthalmic ROS: negative for - blurry vision, eye pain or loss of vision ENT ROS: negative for - headaches, hearing change, visual changes or vocal changes Allergy and Immunology ROS: negative for - hives, itchy/watery eyes or seasonal allergies Hematological and Lymphatic ROS: negative for - bleeding problems, bruising, swollen lymph nodes or weight loss Endocrine ROS: negative for - galactorrhea, hair pattern changes, hot flashes, malaise/lethargy, mood swings, palpitations, polydipsia/polyuria, skin changes, temperature intolerance or unexpected weight changes Breast ROS: negative for - new or changing breast lumps or nipple discharge Respiratory ROS: negative for - cough or shortness of breath Cardiovascular ROS: negative for - chest pain, irregular heartbeat, palpitations or shortness of breath Gastrointestinal ROS: no abdominal pain, change in bowel habits, or black or bloody stools Genito-Urinary ROS: no dysuria, trouble voiding, or hematuria Musculoskeletal ROS: negative for - joint pain or joint stiffness Neurological ROS: negative for - bowel and bladder control changes Dermatological ROS: negative for rash and skin lesion changes   Objective:   BP 120/82 mmHg  Pulse 84  Ht 5\' 7"  (1.702 m)  Wt 192 lb 8 oz (87.317 kg)  BMI 30.14 kg/m2 CONSTITUTIONAL: Well-developed, well-nourished female in no acute distress.  PSYCHIATRIC: Normal mood and affect. Normal behavior. Normal judgment and thought content. [place]: Alert and oriented to person,  place, and time. Normal muscle tone coordination. No cranial nerve deficit noted. HENT:  Normocephalic, atraumatic, External right and left ear normal. Oropharynx is clear and moist EYES: Conjunctivae and EOM are normal. Pupils are equal, round, and reactive to light. No scleral icterus.  NECK: Normal range of motion, supple, no masses.  Normal thyroid.  SKIN: Skin is warm and dry. No rash noted. Not diaphoretic. No erythema. No pallor. CARDIOVASCULAR: Normal heart rate noted, regular rhythm, no murmur. RESPIRATORY: Clear to auscultation bilaterally. Effort and breath sounds normal, no problems with respiration noted. BREASTS: Symmetric in size. No masses, skin changes, nipple drainage, or lymphadenopathy. ABDOMEN: Soft, normal bowel sounds, no distention noted.  No tenderness, rebound or guarding.  BLADDER: Normal PELVIC:  External Genitalia: Normal  BUS: Normal  Vagina: Normal; Good vault support; no discharge  Cervix: Surgically absent  Uterus: Surgically absent  Adnexa: Normal  RV: External Exam  NormaI, No Rectal Masses and Normal Sphincter tone  MUSCULOSKELETAL: Normal range of motion. No tenderness.  No cyanosis, clubbing, or edema.  2+ distal pulses. LYMPHATIC: No Axillary, Supraclavicular, or Inguinal Adenopathy.    Assessment:   Annual gynecologic examination 54 y.o. Contraception: status post hysterectomy TVH USO bmi-29.5 History of endometriosis; asymptomatic. Anxiety, stable on Wellbutrin  Plan:  Pap: Not needed Mammogram: Ordered Stool Guaiac Testing:  Ordered Labs: thru pcp Routine preventative health maintenance measures emphasized: counseling (consider employee assistance program), Exercise/Diet/Weight control, Tobacco Warnings and Alcohol/Substance use risks Calcium with vitamin D supplementation daily Weight loss goal of 10 pounds in the year Return to [place], CMA  Brayton Mars, MD  Note: This dictation was prepared with  Dragon dictation along with smaller phrase technology. Any transcriptional errors that result from this process are unintentional.

## 2015-08-10 NOTE — Patient Instructions (Signed)
1.  No Pap smear is necessary. 2.  Annual mammogram is scheduled. 3.  Stool guaiac card testing for colon cancer screening.  Is given. 4.  Continue healthy eating and exercise 30 minutes day 5 days a week. 4.  Encourage weight loss, approximately 1 pound month as a goal 5.  Continue with calcium and vitamin D supplementation. 6.  Follow-up with Dr. Derrel Nip   Internal medicine health issues. 7.  Return in 1 year

## 2015-08-29 ENCOUNTER — Ambulatory Visit (INDEPENDENT_AMBULATORY_CARE_PROVIDER_SITE_OTHER): Payer: BLUE CROSS/BLUE SHIELD | Admitting: Internal Medicine

## 2015-08-29 ENCOUNTER — Encounter: Payer: Self-pay | Admitting: Internal Medicine

## 2015-08-29 VITALS — BP 120/80 | HR 99 | Temp 97.7°F | Ht 67.0 in | Wt 189.2 lb

## 2015-08-29 DIAGNOSIS — R0683 Snoring: Secondary | ICD-10-CM | POA: Diagnosis not present

## 2015-08-29 DIAGNOSIS — F419 Anxiety disorder, unspecified: Secondary | ICD-10-CM

## 2015-08-29 DIAGNOSIS — E669 Obesity, unspecified: Secondary | ICD-10-CM

## 2015-08-29 DIAGNOSIS — K580 Irritable bowel syndrome with diarrhea: Secondary | ICD-10-CM

## 2015-08-29 MED ORDER — PHENTERMINE HCL 37.5 MG PO TABS: ORAL_TABLET | ORAL | Status: DC

## 2015-08-29 NOTE — Progress Notes (Signed)
Pre visit review using our clinic review tool, if applicable. No additional management support is needed unless otherwise documented below in the visit note. 

## 2015-08-29 NOTE — Patient Instructions (Addendum)
Increase  The buspirone to 10 mg  Per dose for anxiety  Get 15 minutes of sunshine daily  Ask hubby about snoring and breathing pattern (sleep apnea?)  Start taking the dicyclomine before the problem meals. (20-30 minutes  Before your meal )   Danton Clap now makes a frozen breakfast frittata that can be microwaved in 2 minutes and is very low carb. Frittats are similar to quiches without the crust  Diet for Irritable Bowel Syndrome When you have irritable bowel syndrome (IBS), the foods you eat and your eating habits are very important. IBS may cause various symptoms, such as abdominal pain, constipation, or diarrhea. Choosing the right foods can help ease discomfort caused by these symptoms. Work with your health care provider and dietitian to find the best eating plan to help control your symptoms. WHAT GENERAL GUIDELINES DO I NEED TO FOLLOW?  Keep a food diary. This will help you identify foods that cause symptoms. Write down:  What you eat and when.  What symptoms you have.  When symptoms occur in relation to your meals.  Avoid foods that cause symptoms. Talk with your dietitian about other ways to get the same nutrients that are in these foods.  Eat more foods that contain fiber. Take a fiber supplement if directed by your dietitian.  Eat your meals slowly, in a relaxed setting.  Aim to eat 5-6 small meals per day. Do not skip meals.  Drink enough fluids to keep your urine clear or pale yellow.  Ask your health care provider if you should take an over-the-counter probiotic during flare-ups to help restore healthy gut bacteria.  If you have cramping or diarrhea, try making your meals low in fat and high in carbohydrates. Examples of carbohydrates are pasta, rice, whole grain breads and cereals, fruits, and vegetables.  If dairy products cause your symptoms to flare up, try eating less of them. You might be able to handle yogurt better than other dairy products because it  contains bacteria that help with digestion. WHAT FOODS ARE NOT RECOMMENDED? The following are some foods and drinks that may worsen your symptoms:  Fatty foods, such as [place] fries.  Milk products, such as cheese or ice cream.  Chocolate.  Alcohol.  Products with caffeine, such as coffee.  Carbonated drinks, such as soda. The items listed above may not be a complete list of foods and beverages to avoid. Contact your dietitian for more information. WHAT FOODS ARE GOOD SOURCES OF FIBER? Your health care provider or dietitian may recommend that you eat more foods that contain fiber. Fiber can help reduce constipation and other IBS symptoms. Add foods with fiber to your diet a little at a time so that your body can get used to them. Too much fiber at once might cause gas and swelling of your abdomen. The following are some foods that are good sources of fiber:  Apples.  Peaches.  Pears.  Berries.  Figs.  Broccoli (raw).  Cabbage.  Carrots.  Raw peas.  Kidney beans.  Lima beans.  Whole grain bread.  Whole grain cereal. FOR MORE INFORMATION  International Foundation for Functional Gastrointestinal Disorders: www.iffgd.[employer]: NetworkAffair.co.za.aspx   This information is not intended to replace advice given to you by your health care provider. Make sure you discuss any questions you have with your health care provider.   Document Released: 09/21/2003 Document Revised: 07/22/2014 Document Reviewed: 10/01/2013 Elsevier Interactive Patient Education 2016 [employer]  [employer]

## 2015-08-29 NOTE — Progress Notes (Signed)
Subjective:  Patient ID: Colleen Washington, female    DOB: 1961-05-12  Age: 55 y.o. MRN: [id number]  CC: The primary encounter diagnosis was Irritable bowel syndrome with diarrhea. Diagnoses of Snoring, Obesity, and Anxiety were also pertinent to this visit.  Colleen Washington presents for follow up on multiple issues inclluding GAD and weight gain .  Eating more salads;  has been having more loose stools,  Doing 12,000 steps daily  Energy low, craving sweets. 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  being irritable. Has been on wellbtutrin for 10 years takes it in the morning   Tried the buspirone  At 5 mg "it didn't work"  Increased fatigue.  Falling asleep fine,  No nighttmie voids,  In bed by 7 pm ,  Out of bed at 5 am .  Snores with deep sleep .  Feels ok once she showers.  Exhausted and sleepy once she gets home.  Uses treadmill after she gets home at 3:45.  No prior sleep study     Outpatient Prescriptions Prior to Visit  Medication Sig Dispense Refill  . albuterol (PROVENTIL HFA;VENTOLIN HFA) 108 (90 BASE) MCG/ACT inhaler Inhale 2 puffs into the lungs every 6 (six) hours as needed for wheezing or shortness of breath. 3.7 g 5  . buPROPion (WELLBUTRIN XL) 150 MG 24 hr tablet Take 1 tablet (150 mg total) by mouth daily. 90 tablet 2  . busPIRone (BUSPAR) 5 MG tablet Take 1 tablet (5 mg total) by mouth 3 (three) times daily. 90 tablet 2  . cetirizine (ZYRTEC) 10 MG tablet Take 10 mg by mouth daily.    [name] dicyclomine (BENTYL) 20 MG tablet Take 1 tablet (20 mg total) by mouth 4 (four) times daily -  before meals and at bedtime. 90 tablet 1  . fluticasone (FLONASE) 50 MCG/ACT nasal spray Place 2 sprays into both nostrils daily. 16 g 4  . gabapentin (NEURONTIN) 100 MG capsule Take 1 capsule (100 mg total) by mouth 3 (three) times daily. 90 capsule 3  . hyoscyamine (LEVSIN SL) 0.125 MG SL tablet Place 1 tablet (0.125 mg total) under the tongue  every 4 (four) hours as needed. 30 tablet 0  . omeprazole (PRILOSEC) 40 MG capsule Take 1 capsule (40 mg total) by mouth 2 (two) times daily. 180 capsule 1  . ondansetron (ZOFRAN) 4 MG tablet TAKE ONE TABLET EVERY 8 HOURS AS NEEDED FOR NAUSEA AND VOMITING 30 tablet 1  . promethazine (PHENERGAN) 12.5 MG tablet Take 12.5 mg by mouth every 6 (six) hours as needed for nausea or vomiting.    . Vitamin D, Ergocalciferol, (DRISDOL) 50000 UNITS CAPS capsule Take 1 capsule (50,000 Units total) by mouth every 7 (seven) days. 12 capsule 0  . phentermine (ADIPEX-P) 37.5 MG tablet 1/2 tablet in the am and early afternoon 30 tablet 2   No facility-administered medications prior to visit.    Review of Systems;  Patient denies headache, fevers, malaise, unintentional weight loss, skin rash, eye pain, sinus congestion and sinus pain, sore throat, dysphagia,  hemoptysis , cough, dyspnea, wheezing, chest pain, palpitations, orthopnea, edema, abdominal pain, nausea, melena, diarrhea, constipation, flank pain, dysuria, hematuria, urinary  Frequency, nocturia, numbness, tingling, seizures,  Focal weakness, Loss of consciousness,  Tremor, insomnia, depression, anxiety, and suicidal ideation.      Objective:  BP 120/80 mmHg  Pulse 99  Temp(Src) 97.7 F (36.5 C) (Oral)  Ht  5\' 7"  (1.702 m)  Wt 189 lb 4 oz (85.843 kg)  BMI 29.63 kg/m2  SpO2 94%  BP Readings from Last 3 Encounters:  08/29/15 120/80  08/10/15 120/82  06/19/15 110/80    Wt Readings from Last 3 Encounters:  08/29/15 189 lb 4 oz (85.843 kg)  08/10/15 192 lb 8 oz (87.317 kg)  05/24/15 190 lb (86.183 kg)    General appearance: alert, cooperative and appears stated age Ears: normal TM's and external ear canals both ears Throat: lips, mucosa, and tongue normal; teeth and gums normal Neck: no adenopathy, no carotid bruit, supple, symmetrical, trachea midline and thyroid not enlarged, symmetric, no tenderness/mass/nodules Back: symmetric, no  curvature. ROM normal. No CVA tenderness. Lungs: clear to auscultation bilaterally Heart: regular rate and rhythm, S1, S2 normal, no murmur, click, rub or gallop Abdomen: soft, non-tender; bowel sounds normal; no masses,  no organomegaly Pulses: 2+ and symmetric Skin: Skin color, texture, turgor normal. No rashes or lesions Lymph nodes: Cervical, supraclavicular, and axillary nodes normal.  No results found for: HGBA1C  Lab Results  Component Value Date   CREATININE 0.81 05/24/2015   CREATININE 0.79 08/31/2014   CREATININE 0.7 11/15/2013    Lab Results  Component Value Date   WBC 4.9 08/31/2014   HGB 13.8 08/31/2014   HCT 39.9 08/31/2014   PLT 191.0 08/31/2014   GLUCOSE 72 05/24/2015   CHOL 223* 08/31/2014   TRIG 108.0 08/31/2014   HDL 46.60 08/31/2014   LDLCALC 155* 08/31/2014   ALT 14 05/24/2015   AST 18 05/24/2015   NA 136 05/24/2015   K 4.0 05/24/2015   CL 102 05/24/2015   CREATININE 0.81 05/24/2015   BUN 15 05/24/2015   CO2 25 05/24/2015   TSH 1.18 08/31/2014    No results found.  Assessment & Plan:   Problem List Items Addressed This Visit    Irritable bowel syndrome - Primary    No improvement with Bentyl.  IBS diet given.       Obesity    She has had difficulty losing weight due to increased appetite and is requesting a trial of  Phentermine.  She is aware of the possible side effects and risks and understands that    The medication will be discontinued if she has not lost 5% of her body weight over the next 3 months, which , based on today's weight is 9 lbs.      Relevant Medications   phentermine (ADIPEX-P) 37.5 MG tablet   Snoring    Suspect OSA given history of irritability, fatigue,. Advised to consider sleep study      Anxiety    Advised to increase buspirone to 10 mg tid.         A total of 25 minutes of face to face time was spent with patient more than half of which was spent in counselling about the above mentioned conditions  and  coordination of care  I am having Ms. Hopfensperger maintain her cetirizine, dicyclomine, hyoscyamine, fluticasone, gabapentin, promethazine, albuterol, busPIRone, buPROPion, omeprazole, Vitamin D (Ergocalciferol), ondansetron, and phentermine.  Meds ordered this encounter  Medications  . phentermine (ADIPEX-P) 37.5 MG tablet    Sig: 1/2 tablet in the am and early afternoon    Dispense:  30 tablet    Refill:  2    Medications Discontinued During This Encounter  Medication Reason  . phentermine (ADIPEX-P) 37.5 MG tablet Reorder    Follow-up: No Follow-up on file.   Crecencio Mc, MD

## 2015-08-30 NOTE — Assessment & Plan Note (Signed)
No improvement with Bentyl.  IBS diet given.

## 2015-08-30 NOTE — Assessment & Plan Note (Signed)
Advised to increase buspirone to 10 mg tid.

## 2015-08-30 NOTE — Assessment & Plan Note (Signed)
Suspect OSA given history of irritability, fatigue,. Advised to consider sleep study

## 2015-08-30 NOTE — Assessment & Plan Note (Signed)
She has had difficulty losing weight due to increased appetite and is requesting a trial of  Phentermine.  She is aware of the possible side effects and risks and understands that    The medication will be discontinued if she has not lost 5% of her body weight over the next 3 months, which , based on today's weight is 9 lbs. 

## 2015-09-03 ENCOUNTER — Emergency Department
Admission: EM | Admit: 2015-09-03 | Discharge: 2015-09-03 | Disposition: A | Discharge status: Home / Self Care | Payer: BLUE CROSS/BLUE SHIELD | Attending: Emergency Medicine | Admitting: Emergency Medicine

## 2015-09-03 ENCOUNTER — Emergency Department: Payer: BLUE CROSS/BLUE SHIELD

## 2015-09-03 ENCOUNTER — Encounter: Payer: Self-pay | Admitting: Emergency Medicine

## 2015-09-03 DIAGNOSIS — K2970 Gastritis, unspecified, without bleeding: Secondary | ICD-10-CM | POA: Diagnosis not present

## 2015-09-03 DIAGNOSIS — Z87891 Personal history of nicotine dependence: Secondary | ICD-10-CM | POA: Insufficient documentation

## 2015-09-03 DIAGNOSIS — Z7951 Long term (current) use of inhaled steroids: Secondary | ICD-10-CM | POA: Diagnosis not present

## 2015-09-03 DIAGNOSIS — I10 Essential (primary) hypertension: Secondary | ICD-10-CM | POA: Diagnosis not present

## 2015-09-03 DIAGNOSIS — R1010 Upper abdominal pain, unspecified: Secondary | ICD-10-CM | POA: Diagnosis present

## 2015-09-03 DIAGNOSIS — Z79899 Other long term (current) drug therapy: Secondary | ICD-10-CM | POA: Diagnosis not present

## 2015-09-03 DIAGNOSIS — R109 Unspecified abdominal pain: Secondary | ICD-10-CM

## 2015-09-03 LAB — LIPASE, BLOOD: Lipase: 23 U/L (ref 11–51)

## 2015-09-03 LAB — HEPATIC FUNCTION PANEL
ALT: 13 U/L — ABNORMAL LOW (ref 14–54)
AST: 19 U/L (ref 15–41)
Albumin: 4.2 g/dL (ref 3.5–5.0)
Alkaline Phosphatase: 67 U/L (ref 38–126)
Bilirubin, Direct: <0.1 mg/dL — ABNORMAL LOW (ref 0.1–0.5)
Total Bilirubin: 0.6 mg/dL (ref 0.3–1.2)
Total Protein: 7.5 g/dL (ref 6.5–8.1)

## 2015-09-03 LAB — BASIC METABOLIC PANEL
Anion gap: 6 (ref 5–15)
BUN: 8 mg/dL (ref 6–20)
CO2: 23 mmol/L (ref 22–32)
Calcium: 8.9 mg/dL (ref 8.9–10.3)
Chloride: 108 mmol/L (ref 101–111)
Creatinine, Ser: 0.65 mg/dL (ref 0.44–1.00)
GFR calc Af Amer: >60 mL/min (ref >60)
GFR calc non Af Amer: >60 mL/min (ref >60)
Glucose, Bld: 103 mg/dL — ABNORMAL HIGH (ref 65–99)
Potassium: 4.1 mmol/L (ref 3.5–5.1)
Sodium: 137 mmol/L (ref 135–145)

## 2015-09-03 LAB — CBC
HCT: 41.6 % (ref 35.0–47.0)
Hemoglobin: 14 g/dL (ref 12.0–16.0)
MCH: 29.3 pg (ref 26.0–34.0)
MCHC: 33.7 g/dL (ref 32.0–36.0)
MCV: 86.9 fL (ref 80.0–100.0)
Platelets: 197 10*3/uL (ref 150–440)
RBC: 4.79 MIL/uL (ref 3.80–5.20)
RDW: 12.8 % (ref 11.5–14.5)
WBC: 6.4 10*3/uL (ref 3.6–11.0)

## 2015-09-03 LAB — TROPONIN I: Troponin I: <0.03 ng/mL (ref <0.031)

## 2015-09-03 MED ORDER — SUCRALFATE 1 G PO TABS: 1.0000 g | ORAL_TABLET | Freq: Four times a day (QID) | ORAL | Status: DC

## 2015-09-03 MED ORDER — GI COCKTAIL ~~LOC~~
30.0000 mL | Freq: Once | ORAL | Status: AC
  Administered 2015-09-03: 30 mL via ORAL
  Filled 2015-09-03 (×2): qty 30

## 2015-09-03 NOTE — ED Provider Notes (Signed)
[facility] Emergency Department Provider Note  ____________________________________________    I have reviewed the triage vital signs and the nursing notes.   HISTORY  Chief Complaint Abdominal Pain and Chest Pain    HPI Colleen Washington is a 55 y.o. female who presents with complaints of upper abdominal pain. Patient reports she has had this pain intermittently over the last month. Pain became constant last night after eating. She does report nausea. She reports the pain radiates to her right shoulder blade. She denies fevers or chills.    Past Medical History  Diagnosis Date  . allergic rhinitis   . Depression   . History of diverticulitis of colon   . GERD (gastroesophageal reflux disease)   . Hyperlipidemia   . Irritable bowel syndrome   . H/O migraine   . Hypertension   . Herniated intervertebral disc   . Endometriosis   . IBS (irritable bowel syndrome)   . AR (allergic rhinitis)   . Anxiety     Patient Active Problem List   Diagnosis Date Noted  . Endometriosis 08/10/2015  . Status post vaginal hysterectomy 08/10/2015  . Anxiety 08/10/2015  . Increased BMI 08/10/2015  . Vitamin D deficiency 05/26/2015  . Weight gain 02/22/2015  . Numbness of arm 09/02/2014  . Menopause 09/02/2014  . Back pain 03/12/2014  . Obesity 08/24/2013  . Visit for preventive health examination 08/24/2013  . Snoring 08/24/2013  . Symptoms, such as flushing, sleeplessness, headache, lack of concentration, associated with the menopause 08/24/2013  . Encounter for drug screening 08/13/2013  . Migraine, unspecified, without mention of intractable migraine without mention of status migrainosus 08/26/2012  . 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 Bronchitis, chronic ([place]) 05/09/2012  . Depression   . History of diverticulitis of colon   . GERD (gastroesophageal reflux disease)   . Hyperlipidemia   . Irritable bowel syndrome   .  Hypertension   . Herniated intervertebral disc   . Tobacco abuse 03/04/2012    Past Surgical History  Procedure Laterality Date  . Abdominal hysterectomy  2003    [email or web address]    Current Outpatient Rx  Name  Route  Sig  Dispense  Refill  . albuterol (PROVENTIL HFA;VENTOLIN HFA) 108 (90 BASE) MCG/ACT inhaler   Inhalation   Inhale 2 puffs into the lungs every 6 (six) hours as needed for wheezing or shortness of breath.   3.7 g   5   . buPROPion (WELLBUTRIN XL) 150 MG 24 hr tablet   Oral   Take 1 tablet (150 mg total) by mouth daily.   90 tablet   2   . busPIRone (BUSPAR) 5 MG tablet   Oral   Take 1 tablet (5 mg total) by mouth 3 (three) times daily.   90 tablet   2   . cetirizine (ZYRTEC) 10 MG tablet   Oral   Take 10 mg by mouth daily.         [name] dicyclomine (BENTYL) 20 MG tablet   Oral   Take 1 tablet (20 mg total) by mouth 4 (four) times daily -  before meals and at bedtime.   90 tablet   1   . fluticasone (FLONASE) 50 MCG/ACT nasal spray   Each Nare   Place 2 sprays into both nostrils daily.   16 g   4   . gabapentin (NEURONTIN) 100 MG capsule   Oral   Take 1 capsule (100 mg total) by  mouth 3 (three) times daily.   90 capsule   3   . hyoscyamine (LEVSIN SL) 0.125 MG SL tablet   Sublingual   Place 1 tablet (0.125 mg total) under the tongue every 4 (four) hours as needed.   30 tablet   0   . omeprazole (PRILOSEC) 40 MG capsule   Oral   Take 1 capsule (40 mg total) by mouth 2 (two) times daily.   180 capsule   1   . ondansetron (ZOFRAN) 4 MG tablet      TAKE ONE TABLET EVERY 8 HOURS AS NEEDED FOR NAUSEA AND VOMITING   30 tablet   1   . phentermine (ADIPEX-P) 37.5 MG tablet      1/2 tablet in the am and early afternoon   30 tablet   2   . promethazine (PHENERGAN) 12.5 MG tablet   Oral   Take 12.5 mg by mouth every 6 (six) hours as needed for nausea or vomiting.         . Vitamin D, Ergocalciferol, (DRISDOL) 50000 UNITS CAPS capsule   Oral    Take 1 capsule (50,000 Units total) by mouth every 7 (seven) days.   12 capsule   0     Allergies Compazine  Family History  Problem Relation Age of Onset  . Arthritis Mother   . Hyperlipidemia Mother   . Cancer Father     colon  . Arthritis Maternal Grandmother   . Hyperlipidemia Maternal Grandfather   . Diabetes Brother   . Heart disease Neg Hx   . Breast cancer Neg Hx   . Colon cancer Neg Hx     Social History Social History  Substance Use Topics  . Smoking status: Former Smoker -- 0.00 packs/day for 20 years    Types: Cigarettes  . Smokeless tobacco: Never Used  . Alcohol Use: 0.0 oz/week    0 Standard drinks or equivalent per week     Comment: occas    Review of Systems  Constitutional: Negative for fever. Eyes: Negative for visual changes. ENT: Negative for sore throat Cardiovascular: Negative for chest pain. Respiratory: Negative for shortness of breath. Gastrointestinal: As above Genitourinary: Negative for dysuria. Musculoskeletal: Negative for back pain. Skin: Negative for rash. Neurological: Negative for headaches  Psychiatric: No anxiety    ____________________________________________   PHYSICAL EXAM:  VITAL SIGNS: ED Triage Vitals  Enc Vitals Group     BP 09/03/15 1300 127/86 mmHg     Pulse Rate 09/03/15 1300 94     Resp 09/03/15 1300 18     Temp 09/03/15 1300 97.7 F (36.5 C)     Temp Source 09/03/15 1300 Oral     SpO2 09/03/15 1300 98 %     Weight 09/03/15 1300 192 lb (87.091 kg)     Height 09/03/15 1300 5\' 7"  (1.702 m)     Head Cir --      Peak Flow --      Pain Score 09/03/15 1301 5     Pain Loc --      Pain Edu? --      Excl. in [place]? --      Constitutional: Alert and oriented. Well appearing and in no distress. Eyes: Conjunctivae are normal.  ENT   Head: Normocephalic and atraumatic.   Mouth/Throat: Mucous membranes are moist. Cardiovascular: Normal rate, regular rhythm. Normal and symmetric distal pulses are  present in all extremities.  Respiratory: Normal respiratory effort without tachypnea nor retractions. Breath sounds  are clear and equal bilaterally.  Gastrointestinal: Mild to moderate tenderness to palpation right upper quadrant. No distention.  Genitourinary: deferred Musculoskeletal: Nontender with normal range of motion in all extremities.  Neurologic:  Normal speech and language. No gross focal neurologic deficits are appreciated. Skin:  Skin is warm, dry and intact. No rash noted. Psychiatric: Mood and affect are normal. Patient exhibits appropriate insight and judgment.  ____________________________________________    LABS (pertinent positives/negatives)  Labs Reviewed  BASIC METABOLIC PANEL - Abnormal; Notable for the following:    Glucose, Bld 103 (*)    All other components within normal limits  CBC  TROPONIN I  LIPASE, BLOOD  HEPATIC FUNCTION PANEL    ____________________________________________   EKG  ED ECG REPORT I, Lavonia Drafts, the attending physician, personally viewed and interpreted this ECG.  Date: 09/03/2015 EKG Time: 12:55 PM Rate: 90 Rhythm: normal sinus rhythm QRS Axis: normal Intervals: normal ST/T Wave abnormalities: normal Conduction Disturbances: none Narrative Interpretation: unremarkable  ____________________________________________    RADIOLOGY I have personally reviewed any xrays that were ordered on this patient: Chest x-ray unremarkable Ultrasound unremarkable  ____________________________________________   PROCEDURES  Procedure(s) performed: none  Critical Care performed: none  ____________________________________________   INITIAL IMPRESSION / ASSESSMENT AND PLAN / ED COURSE  Pertinent labs & imaging results that were available during my care of the patient were reviewed by me and considered in my medical decision making (see chart for details).  Patient presents with upper abdominal pain. She is tender over the  epigastrium and is concerned about her gallbladder. We will obtain ultrasound of the right upper quadrant. PUD versus gastritis versus cholecystitis.  GI cocktail given with some improvement.  Ultrasound unremarkable. Suspect gastritis versus PUD. Patient does have a long history of heartburn. Recommend follow-up with PCP/GI, we'll start Carafate  ____________________________________________   FINAL CLINICAL IMPRESSION(S) / ED DIAGNOSES  Final diagnoses:  Abdominal pain     Lavonia Drafts, MD 09/03/15 1801

## 2015-09-03 NOTE — ED Notes (Signed)
Patient to ER for epigastric pain/chest tightness. States pain began last night. +Nausea. Reports pain sometimes worse after eating, radiates to back.

## 2015-09-03 NOTE — ED Notes (Signed)
Lab results reviewed

## 2015-09-03 NOTE — ED Notes (Signed)
Discussed discharge instructions, prescriptions, and follow-up care with patient. No questions or concerns at this time. Pt stable at discharge.  

## 2015-09-03 NOTE — Discharge Instructions (Signed)

## 2015-09-05 ENCOUNTER — Telehealth: Payer: Self-pay | Admitting: Gastroenterology

## 2015-09-05 NOTE — Telephone Encounter (Signed)
Colleen Washington, this is a new patient referred from the ER. She was seen recently in ER and was diagnosed with gastritis and possible ulcer. She used to see a GI Dr Minna Merritts but he is no longer in practice. She has a history of heartburn, IBS and diverticulotis. Patient would like to speak with you about what she should eat/not eat to make the symptoms worse/better. She was given prescription of Carafate and said that seems to help some, but while in the ER was given a GI Cocktail and that helped the most. I have scheduled her for the first available appointment with Dr Allen Norris which was April 4th. If you feel this is more urgent and she should be seen sooner, please let me know what day she can be seen and I will call her and change her appointment day/time. Please call her at your convenience. Thank you.

## 2015-09-08 ENCOUNTER — Encounter: Payer: Self-pay | Admitting: Physician Assistant

## 2015-09-08 ENCOUNTER — Ambulatory Visit: Payer: Self-pay | Admitting: Physician Assistant

## 2015-09-08 DIAGNOSIS — G43009 Migraine without aura, not intractable, without status migrainosus: Secondary | ICD-10-CM

## 2015-09-08 NOTE — Progress Notes (Signed)
   Subjective:Nausea/Headache    Patient ID: Colleen Washington, female    DOB: Feb 12, 1961, 55 y.o.   MRN: [id number]  HPI Patient c/o nausea/vomiting and right side headache.  History of migraine, last episode greater than 6 months.  Denies photophobia or over visual disturbance. Denies aura. Denies vertigo. No palliative measure for this compliant.    Review of Systems Nausea and vomiting.    Objective:   Physical Exam HEENT unremarkable. Neck supple, Lungs CTA, and Heart RRR.       Assessment & Plan:migraine headache  Zofran ODT and Imitrex.  Advised to go to ER if no improvement in 2-3 hours.

## 2015-09-12 ENCOUNTER — Ambulatory Visit (INDEPENDENT_AMBULATORY_CARE_PROVIDER_SITE_OTHER): Payer: BLUE CROSS/BLUE SHIELD | Admitting: Internal Medicine

## 2015-09-12 ENCOUNTER — Encounter: Payer: Self-pay | Admitting: Internal Medicine

## 2015-09-12 VITALS — BP 108/78 | HR 80 | Temp 97.9°F | Resp 12 | Ht 67.0 in | Wt 187.5 lb

## 2015-09-12 DIAGNOSIS — R197 Diarrhea, unspecified: Secondary | ICD-10-CM | POA: Diagnosis not present

## 2015-09-12 DIAGNOSIS — R140 Abdominal distension (gaseous): Secondary | ICD-10-CM

## 2015-09-12 DIAGNOSIS — R110 Nausea: Secondary | ICD-10-CM

## 2015-09-12 DIAGNOSIS — Z8719 Personal history of other diseases of the digestive system: Secondary | ICD-10-CM

## 2015-09-12 DIAGNOSIS — G43009 Migraine without aura, not intractable, without status migrainosus: Secondary | ICD-10-CM

## 2015-09-12 DIAGNOSIS — R1013 Epigastric pain: Secondary | ICD-10-CM | POA: Diagnosis not present

## 2015-09-12 NOTE — Progress Notes (Signed)
Subjective:  Patient ID: Colleen Washington, female    DOB: 07/07/1961  Age: 55 y.o. MRN: [id number]  CC: The primary encounter diagnosis was Nausea without vomiting. Diagnoses of Abdominal pain, epigastric, Bloating, Diarrhea, unspecified type, History of diverticulitis of colon, and Nonintractable migraine, unspecified migraine type were also pertinent to this visit.  HPI Colleen Washington presents for recurrent  nausea AND vomiting  Since Feb 19th .  Seen in ER on Feb 19th, for intermittent upper abdominal  Pain that has been recurrent over the last month, made worse recently  by eating steak at a restaurant, radiating to right shoulder blade,  Ultrasound was done. GB normal . Given a GI cocktail which relieved her abd  pain . Sent home with rx for carafate . Stopped the carafate after a few days.     This past Friday (2/25) developed a migraine headache with  recurrent nausea and vomiting .[name]  Sent to [facility] when she continued  vomiting despute use of oral zofran x 2,  Given zofran SL and went home and took imitrex every 2 hours,  Headache resolved. [name]  Has been having a daily headache since then, but the headaches have been minor .  She  Has had recurrent nausea without vomiting.  Has IBS and  alternates between constipation and diarrhea.   Feeling bloated all the time , but has had 7 loose stools today Still taking buspirone,   PPI and zofran .  Has appointment with GI April 4th for [facility] .  Outpatient Prescriptions Prior to Visit  Medication Sig Dispense Refill  . albuterol (PROVENTIL HFA;VENTOLIN HFA) 108 (90 BASE) MCG/ACT inhaler Inhale 2 puffs into the lungs every 6 (six) hours as needed for wheezing or shortness of breath. 3.7 g 5  . buPROPion (WELLBUTRIN XL) 150 MG 24 hr tablet Take 1 tablet (150 mg total) by mouth daily. 90 tablet 2  . omeprazole (PRILOSEC) 40 MG capsule Take 1 capsule (40 mg total) by mouth 2 (two) times daily. 180 capsule 1  . ondansetron (ZOFRAN) 4 MG tablet  TAKE ONE TABLET EVERY 8 HOURS AS NEEDED FOR NAUSEA AND VOMITING 30 tablet 1  . promethazine (PHENERGAN) 12.5 MG tablet Take 12.5 mg by mouth every 6 (six) hours as needed for nausea or vomiting.    . sucralfate (CARAFATE) 1 g tablet Take 1 tablet (1 g total) by mouth 4 (four) times daily. 80 tablet 1  . busPIRone (BUSPAR) 5 MG tablet Take 1 tablet (5 mg total) by mouth 3 (three) times daily. (Patient not taking: Reported on 09/12/2015) 90 tablet 2  . cetirizine (ZYRTEC) 10 MG tablet Take 10 mg by mouth daily. Reported on 09/12/2015    . dicyclomine (BENTYL) 20 MG tablet Take 1 tablet (20 mg total) by mouth 4 (four) times daily -  before meals and at bedtime. (Patient not taking: Reported on 09/12/2015) 90 tablet 1  . fluticasone (FLONASE) 50 MCG/ACT nasal spray Place 2 sprays into both nostrils daily. (Patient not taking: Reported on 09/12/2015) 16 g 4  . gabapentin (NEURONTIN) 100 MG capsule Take 1 capsule (100 mg total) by mouth 3 (three) times daily. (Patient not taking: Reported on 09/12/2015) 90 capsule 3  . hyoscyamine (LEVSIN SL) 0.125 MG SL tablet Place 1 tablet (0.125 mg total) under the tongue every 4 (four) hours as needed. (Patient not taking: Reported on 09/12/2015) 30 tablet 0  . phentermine (ADIPEX-P) 37.5 MG tablet 1/2 tablet in the am and early  afternoon (Patient not taking: Reported on 09/12/2015) 30 tablet 2  . Vitamin D, Ergocalciferol, (DRISDOL) 50000 UNITS CAPS capsule Take 1 capsule (50,000 Units total) by mouth every 7 (seven) days. (Patient not taking: Reported on 09/12/2015) 12 capsule 0   No facility-administered medications prior to visit.    Review of Systems;  Patient denies headache, fevers, malaise, unintentional weight loss, skin rash, eye pain, sinus congestion and sinus pain, sore throat, dysphagia,  hemoptysis , cough, dyspnea, wheezing, chest pain, palpitations, orthopnea, edema, abdominal pain, nausea, melena, diarrhea, constipation, flank pain, dysuria, hematuria,  urinary  Frequency, nocturia, numbness, tingling, seizures,  Focal weakness, Loss of consciousness,  Tremor, insomnia, depression, anxiety, and suicidal ideation.      Objective:  BP 108/78 mmHg  Pulse 80  Temp(Src) 97.9 F (36.6 C) (Oral)  Resp 12  Ht 5\' 7"  (1.702 m)  Wt 187 lb 8 oz (85.049 kg)  BMI 29.36 kg/m2  SpO2 98%  BP Readings from Last 3 Encounters:  09/12/15 108/78  09/03/15 124/85  08/29/15 120/80    Wt Readings from Last 3 Encounters:  09/12/15 187 lb 8 oz (85.049 kg)  09/03/15 192 lb (87.091 kg)  08/29/15 189 lb 4 oz (85.843 kg)    General appearance: alert, cooperative and appears stated age Ears: normal TM's and external ear canals both ears Throat: lips, mucosa, and tongue normal; teeth and gums normal Neck: no adenopathy, no carotid bruit, supple, symmetrical, trachea midline and thyroid not enlarged, symmetric, no tenderness/mass/nodules Back: symmetric, no curvature. ROM normal. No CVA tenderness. Lungs: clear to auscultation bilaterally Heart: regular rate and rhythm, S1, S2 normal, no murmur, click, rub or gallop Abdomen: soft, non-tender; bowel sounds normal; no masses,  no organomegaly Pulses: 2+ and symmetric Skin: Skin color, texture, turgor normal. No rashes or lesions Lymph nodes: Cervical, supraclavicular, and axillary nodes normal.  No results found for: HGBA1C  Lab Results  Component Value Date   CREATININE 0.65 09/03/2015   CREATININE 0.81 05/24/2015   CREATININE 0.79 08/31/2014    Lab Results  Component Value Date   WBC 6.4 09/03/2015   HGB 14.0 09/03/2015   HCT 41.6 09/03/2015   PLT 197 09/03/2015   GLUCOSE 103* 09/03/2015   CHOL 223* 08/31/2014   TRIG 108.0 08/31/2014   HDL 46.60 08/31/2014   LDLCALC 155* 08/31/2014   ALT 13* 09/03/2015   AST 19 09/03/2015   NA 137 09/03/2015   K 4.1 09/03/2015   CL 108 09/03/2015   CREATININE 0.65 09/03/2015   BUN 8 09/03/2015   CO2 23 09/03/2015   TSH 1.18 08/31/2014    Dg Chest  2 View  09/03/2015  CLINICAL DATA:  Anterior chest and epigastric region pain for 1 week. EXAM: CHEST  2 VIEW COMPARISON:  None. FINDINGS: The heart size and mediastinal contours are within normal limits. Both lungs are clear. No pleural effusion or pneumothorax. The visualized skeletal structures are unremarkable. IMPRESSION: No active cardiopulmonary disease. Electronically Signed   By: Lajean Manes M.D.   On: 09/03/2015 13:40   US Abdomen Limited Ruq  09/03/2015  CLINICAL DATA:  Abdominal pain 1 day. EXAM: US ABDOMEN LIMITED - RIGHT UPPER QUADRANT COMPARISON:  None. FINDINGS: Gallbladder: No gallstones or wall thickening visualized. No sonographic Murphy sign noted by sonographer. Common bile duct: Diameter: 5.4 mm. Liver: No focal lesion identified. Within normal limits in parenchymal echogenicity. IMPRESSION: No acute hepatobiliary disease. Electronically Signed   By: Marin Olp M.D.   On: 09/03/2015 17:01  Assessment & Plan:   Problem List Items Addressed This Visit    History of diverticulitis of colon    Given her persistent abdominal pain , nausea and loose stools  Will rule out diverticulitis with CT abd and pelvis      Migraine headache    Recurrence may have been aggravated by use of phentermine for weight loss.  Advised to contiue to suspend medication for now./        Other Visit Diagnoses    Nausea without vomiting    -  Primary    Relevant Orders    CT Abdomen Pelvis W Contrast    Abdominal pain, epigastric        Bloating        Relevant Orders    CT Abdomen Pelvis W Contrast    Diarrhea, unspecified type        Relevant Orders    CT Abdomen Pelvis W Contrast       I am having Ms. Heroux maintain her cetirizine, dicyclomine, hyoscyamine, fluticasone, gabapentin, promethazine, albuterol, busPIRone, buPROPion, omeprazole, Vitamin D (Ergocalciferol), ondansetron, phentermine, and sucralfate.  No orders of the defined types were placed in this encounter.     There are no discontinued medications.  Follow-up: No Follow-up on file.   Crecencio Mc, MD

## 2015-09-12 NOTE — Progress Notes (Signed)
Pre-visit discussion using our clinic review tool. No additional management support is needed unless otherwise documented below in the visit note.  

## 2015-09-12 NOTE — Patient Instructions (Addendum)
Please resume the carafate and continue the omeprazole and zofran as needed   I AM ORDERING A CT WITH CONTRAST TO RULE OUT DIVERTICULITIS AND EVALUATE YOUR GB AND PANCREAS IN MORE DETAIL  Irritable Bowel Syndrome, Adult Irritable bowel syndrome (IBS) is not one specific disease. It is a group of symptoms that affects the organs responsible for digestion (gastrointestinal or GI tract).  To regulate how your GI tract works, your body sends signals back and forth between your intestines and your brain. If you have IBS, there may be a problem with these signals. As a result, your GI tract does not function normally. Your intestines may become more sensitive and overreact to certain things. This is especially true when you eat certain foods or when you are under stress.  There are four types of IBS. These may be determined based on the consistency of your stool:   IBS with diarrhea.   IBS with constipation.   Mixed IBS.   Unsubtyped IBS.  It is important to know which type of IBS you have. Some treatments are more likely to be helpful for certain types of IBS.  CAUSES  The exact cause of IBS is not known. RISK FACTORS You may have a higher risk of IBS if:  You are a woman.  You are younger than 54 years old.  You have a family history of IBS.  You have mental health problems.  You have had bacterial infection of your GI tract. SIGNS AND SYMPTOMS  Symptoms of IBS vary from person to person. The main symptom is abdominal pain or discomfort. Additional symptoms usually include one or more of the following:   Diarrhea, constipation, or both.   Abdominal swelling or bloating.   Feeling full or sick after eating a small or regular-size meal.   Frequent gas.   Mucus in the stool.   A feeling of having more stool left after a bowel movement.  Symptoms tend to come and go. They may be associated with stress, psychiatric conditions, or nothing at all.  DIAGNOSIS  There is no  specific test to diagnose IBS. Your health care provider will make a diagnosis based on a physical exam, medical history, and your symptoms. You may have other tests to rule out other conditions that may be causing your symptoms. These may include:   Blood tests.   X-rays.   CT scan.  Endoscopy and colonoscopy. This is a test in which your GI tract is viewed with a long, thin, flexible tube. TREATMENT There is no cure for IBS, but treatment can help relieve symptoms. IBS treatment often includes:   Changes to your diet, such as:  Eating more fiber.  Avoiding foods that cause symptoms.  Drinking more water.  Eating regular, medium-sized portioned meals.  Medicines. These may include:  Fiber supplements if you have constipation.  Medicine to control diarrhea (antidiarrheal medicines).  Medicine to help control muscle spasms in your GI tract (antispasmodic medicines).  Medicines to help with any mental health issues, such as antidepressants or tranquilizers.  Therapy.  Talk therapy may help with anxiety, depression, or other mental health issues that can make IBS symptoms worse.  Stress reduction.  Managing your stress can help keep symptoms under control. HOME CARE INSTRUCTIONS   Take medicines only as directed by your health care provider.  Eat a healthy diet.  Avoid foods and drinks with added sugar.  Include more whole grains, fruits, and vegetables gradually into your diet. This may be  especially helpful if you have IBS with constipation.  Avoid any foods and drinks that make your symptoms worse. These may include dairy products and caffeinated or carbonated drinks.  Do not eat large meals.  Drink enough fluid to keep your urine clear or pale yellow.  Exercise regularly. Ask your health care provider for recommendations of good activities for you.  Keep all follow-up visits as directed by your health care provider. This is important. SEEK MEDICAL CARE IF:    You have constant pain.  You have trouble or pain with swallowing.  You have worsening diarrhea. SEEK IMMEDIATE MEDICAL CARE IF:   You have severe and worsening abdominal pain.   You have diarrhea and:   You have a rash, stiff neck, or severe headache.   You are irritable, sleepy, or difficult to awaken.   You are weak, dizzy, or extremely thirsty.   You have bright red blood in your stool or you have black tarry stools.   You have unusual abdominal swelling that is painful.   You vomit continuously.   You vomit blood (hematemesis).   You have both abdominal pain and a fever.    This information is not intended to replace advice given to you by your health care provider. Make sure you discuss any questions you have with your health care provider.   Document Released: 07/01/2005 Document Revised: 07/22/2014 Document Reviewed: 03/18/2014 Elsevier Interactive Patient Education [employer].

## 2015-09-14 NOTE — Assessment & Plan Note (Signed)
Recurrence may have been aggravated by use of phentermine for weight loss.  Advised to contiue to suspend medication for now./

## 2015-09-14 NOTE — Assessment & Plan Note (Signed)
Given her persistent abdominal pain , nausea and loose stools  Will rule out diverticulitis with CT abd and pelvis

## 2015-09-22 ENCOUNTER — Ambulatory Visit
Admission: RE | Admit: 2015-09-22 | Discharge: 2015-09-22 | Disposition: A | Discharge status: Home / Self Care | Payer: BLUE CROSS/BLUE SHIELD | Source: Ambulatory Visit | Attending: Internal Medicine | Admitting: Internal Medicine

## 2015-09-22 ENCOUNTER — Telehealth: Payer: Self-pay

## 2015-09-22 DIAGNOSIS — R140 Abdominal distension (gaseous): Secondary | ICD-10-CM | POA: Diagnosis present

## 2015-09-22 DIAGNOSIS — K5730 Diverticulosis of large intestine without perforation or abscess without bleeding: Secondary | ICD-10-CM | POA: Diagnosis not present

## 2015-09-22 DIAGNOSIS — R197 Diarrhea, unspecified: Secondary | ICD-10-CM | POA: Diagnosis present

## 2015-09-22 DIAGNOSIS — R110 Nausea: Secondary | ICD-10-CM | POA: Diagnosis not present

## 2015-09-22 DIAGNOSIS — Z90710 Acquired absence of both cervix and uterus: Secondary | ICD-10-CM | POA: Diagnosis not present

## 2015-09-22 DIAGNOSIS — N83201 Unspecified ovarian cyst, right side: Secondary | ICD-10-CM | POA: Insufficient documentation

## 2015-09-22 MED ORDER — IOHEXOL 350 MG/ML SOLN
100.0000 mL | Freq: Once | INTRAVENOUS | Status: AC | PRN
Start: 2015-09-22 — End: 2015-09-22
  Administered 2015-09-22: 100 mL via INTRAVENOUS

## 2015-09-22 NOTE — Telephone Encounter (Signed)
Called and spoke with the pt regarding her order for her mammogram from Dr. Enzo Bi and she stated yes he did put in an order, she has ye to schedule. I will have the referral coordinations help her setup an appt.

## 2015-09-22 NOTE — Telephone Encounter (Signed)
CT scan showed diverticulosis without inflammation.  Mass in left breast  has been biopsied b in 2016 and was benign,  but she has not had a mammogram this year.  Confirm with her that Dr Enzo Bi has ordered it

## 2015-09-22 NOTE — Telephone Encounter (Signed)
Tracey from [facility] states Ct of abdomen and pelvis 2.6 cn nodule or mass in left breast. Recommend mammography. Sigmoid diverticulosis is noted w/o inflammation

## 2015-09-24 ENCOUNTER — Encounter: Payer: Self-pay | Admitting: Internal Medicine

## 2015-09-27 ENCOUNTER — Ambulatory Visit (INDEPENDENT_AMBULATORY_CARE_PROVIDER_SITE_OTHER): Payer: BLUE CROSS/BLUE SHIELD | Admitting: Internal Medicine

## 2015-09-27 ENCOUNTER — Encounter: Payer: Self-pay | Admitting: Internal Medicine

## 2015-09-27 VITALS — BP 120/78 | HR 80 | Temp 97.7°F | Resp 12 | Ht 67.0 in | Wt 191.5 lb

## 2015-09-27 DIAGNOSIS — E669 Obesity, unspecified: Secondary | ICD-10-CM

## 2015-09-27 DIAGNOSIS — Z8719 Personal history of other diseases of the digestive system: Secondary | ICD-10-CM | POA: Diagnosis not present

## 2015-09-27 DIAGNOSIS — N83201 Unspecified ovarian cyst, right side: Secondary | ICD-10-CM | POA: Diagnosis not present

## 2015-09-27 DIAGNOSIS — K582 Mixed irritable bowel syndrome: Secondary | ICD-10-CM | POA: Diagnosis not present

## 2015-09-27 MED ORDER — DICYCLOMINE HCL 20 MG PO TABS: 20.0000 mg | ORAL_TABLET | Freq: Three times a day (TID) | ORAL | Status: DC

## 2015-09-27 NOTE — Progress Notes (Signed)
Pre-visit discussion using our clinic review tool. No additional management support is needed unless otherwise documented below in the visit note.  

## 2015-09-27 NOTE — Progress Notes (Signed)
Subjective:  Patient ID: Colleen Washington, female    DOB: 1961-06-27  Age: 55 y.o. MRN: [id number]  CC: The primary encounter diagnosis was History of diverticulitis of colon. Diagnoses of Irritable bowel syndrome with both constipation and diarrhea, Obesity, and Ovarian cyst, right were also pertinent to this visit.  HPI Colleen Washington presents for follow up on abdominal  pain and other chronic symptoms.  She is here to discuss the results of her recent abdominal pelvic CT   Right sided ovarian cyst .  Has had transvaginal ultrasound several years ago for same.  Mammogram on Monday  Diverticulosis without evidence of inflammation  on CT,  Had liquid yellow stool all day long after the CT   Has appt with Allen Norris on April 4 .  Still having nausea not daily but about 3 days per week accompanied by headaches.  She has noted some improvement in abdominal symptoms since starting bentyl for IBS   Thinks she can tell when her Vit D drops bc she feels more tired.   Has been taking 5,000 Ius daily for level of 27,  Started it a week ago  Obesit: she has not been able to lose weight.  Diet and exercise discussed.  She is walking 30 minutes daily on a treadmill, not short of breath with it .   \ Outpatient Prescriptions Prior to Visit  Medication Sig Dispense Refill  . albuterol (PROVENTIL HFA;VENTOLIN HFA) 108 (90 BASE) MCG/ACT inhaler Inhale 2 puffs into the lungs every 6 (six) hours as needed for wheezing or shortness of breath. 3.7 g 5  . buPROPion (WELLBUTRIN XL) 150 MG 24 hr tablet Take 1 tablet (150 mg total) by mouth daily. 90 tablet 2  . busPIRone (BUSPAR) 5 MG tablet Take 1 tablet (5 mg total) by mouth 3 (three) times daily. 90 tablet 2  . cetirizine (ZYRTEC) 10 MG tablet Take 10 mg by mouth daily. Reported on 09/12/2015    . fluticasone (FLONASE) 50 MCG/ACT nasal spray Place 2 sprays into both nostrils daily. 16 g 4  . gabapentin (NEURONTIN) 100 MG capsule Take 1 capsule (100 mg total) by mouth 3  (three) times daily. 90 capsule 3  . hyoscyamine (LEVSIN SL) 0.125 MG SL tablet Place 1 tablet (0.125 mg total) under the tongue every 4 (four) hours as needed. 30 tablet 0  . omeprazole (PRILOSEC) 40 MG capsule Take 1 capsule (40 mg total) by mouth 2 (two) times daily. 180 capsule 1  . ondansetron (ZOFRAN) 4 MG tablet TAKE ONE TABLET EVERY 8 HOURS AS NEEDED FOR NAUSEA AND VOMITING 30 tablet 1  . dicyclomine (BENTYL) 20 MG tablet Take 1 tablet (20 mg total) by mouth 4 (four) times daily -  before meals and at bedtime. 90 tablet 1  . promethazine (PHENERGAN) 12.5 MG tablet Take 12.5 mg by mouth every 6 (six) hours as needed for nausea or vomiting. Reported on 09/27/2015    . phentermine (ADIPEX-P) 37.5 MG tablet 1/2 tablet in the am and early afternoon (Patient not taking: Reported on 09/27/2015) 30 tablet 2  . sucralfate (CARAFATE) 1 g tablet Take 1 tablet (1 g total) by mouth 4 (four) times daily. (Patient not taking: Reported on 09/27/2015) 80 tablet 1  . Vitamin D, Ergocalciferol, (DRISDOL) 50000 UNITS CAPS capsule Take 1 capsule (50,000 Units total) by mouth every 7 (seven) days. (Patient not taking: Reported on 09/12/2015) 12 capsule 0   No facility-administered medications prior to visit.    Review  of Systems;  Patient denies headache, fevers, malaise, unintentional weight loss, skin rash, eye pain, sinus congestion and sinus pain, sore throat, dysphagia,  hemoptysis , cough, dyspnea, wheezing, chest pain, palpitations, orthopnea, edema, abdominal pain, nausea, melena, diarrhea, constipation, flank pain, dysuria, hematuria, urinary  Frequency, nocturia, numbness, tingling, seizures,  Focal weakness, Loss of consciousness,  Tremor, insomnia, depression, anxiety, and suicidal ideation.      Objective:  BP 120/78 mmHg  Pulse 80  Temp(Src) 97.7 F (36.5 C) (Oral)  Resp 12  Ht 5\' 7"  (1.702 m)  Wt 191 lb 8 oz (86.864 kg)  BMI 29.99 kg/m2  SpO2 96%  BP Readings from Last 3 Encounters:    09/27/15 120/78  09/12/15 108/78  09/03/15 124/85    Wt Readings from Last 3 Encounters:  09/27/15 191 lb 8 oz (86.864 kg)  09/12/15 187 lb 8 oz (85.049 kg)  09/03/15 192 lb (87.091 kg)    General appearance: alert, cooperative and appears stated age Ears: normal TM's and external ear canals both ears Throat: lips, mucosa, and tongue normal; teeth and gums normal Neck: no adenopathy, no carotid bruit, supple, symmetrical, trachea midline and thyroid not enlarged, symmetric, no tenderness/mass/nodules Back: symmetric, no curvature. ROM normal. No CVA tenderness. Lungs: clear to auscultation bilaterally Heart: regular rate and rhythm, S1, S2 normal, no murmur, click, rub or gallop Abdomen: soft, non-tender; bowel sounds normal; no masses,  no organomegaly Pulses: 2+ and symmetric Skin: Skin color, texture, turgor normal. No rashes or lesions Lymph nodes: Cervical, supraclavicular, and axillary nodes normal.  No results found for: HGBA1C  Lab Results  Component Value Date   CREATININE 0.65 09/03/2015   CREATININE 0.81 05/24/2015   CREATININE 0.79 08/31/2014    Lab Results  Component Value Date   WBC 6.4 09/03/2015   HGB 14.0 09/03/2015   HCT 41.6 09/03/2015   PLT 197 09/03/2015   GLUCOSE 103* 09/03/2015   CHOL 223* 08/31/2014   TRIG 108.0 08/31/2014   HDL 46.60 08/31/2014   LDLCALC 155* 08/31/2014   ALT 13* 09/03/2015   AST 19 09/03/2015   NA 137 09/03/2015   K 4.1 09/03/2015   CL 108 09/03/2015   CREATININE 0.65 09/03/2015   BUN 8 09/03/2015   CO2 23 09/03/2015   TSH 1.18 08/31/2014    Ct Abdomen Pelvis W Contrast  09/25/2015  ADDENDUM REPORT: 09/25/2015 09:30 ADDENDUM: The 2.6 cm oval density in the lateral left breast corresponds to a mass biopsied under ultrasound guidance on 09/06/2014 with benign pathological results. Therefore, this does not require further evaluation. The patient is past due for her previously recommended bilateral screening mammogram.  Therefore, a bilateral screening mammogram is recommended at this time. Electronically Signed   By: Claudie Revering M.D.   On: 09/25/2015 09:30  09/25/2015  CLINICAL DATA:  Epigastric abdominal pain for 4 months. Nausea, diarrhea. EXAM: CT ABDOMEN AND PELVIS WITH CONTRAST TECHNIQUE: Multidetector CT imaging of the abdomen and pelvis was performed using the standard protocol following bolus administration of intravenous contrast. CONTRAST:  170mL OMNIPAQUE IOHEXOL 350 MG/ML SOLN COMPARISON:  None. FINDINGS: Visualized lung bases are unremarkable. 2.6 cm soft tissue density in left breast is noted. No significant osseous abnormality is noted. No gallstones are noted. The liver, spleen and pancreas appear normal. Adrenal glands and kidneys appear normal. No hydronephrosis or renal obstruction is noted. No renal or ureteral calculi are noted. There is no evidence of bowel obstruction. The appendix appears normal. No abnormal fluid collection is noted.  Sigmoid diverticulosis is noted without inflammation. The abdominal aorta appears normal. Status post hysterectomy. Left ovary appears normal. 2.8 cm right ovarian cyst is noted. No significant adenopathy is noted. Urinary bladder is decompressed. IMPRESSION: Sigmoid diverticulosis is noted without inflammation. 2.8 cm right ovarian cyst is noted.  Status post hysterectomy. No acute abnormality is noted in the abdomen or pelvis. 2.6 cm rounded soft tissue density is noted in the left breast. Further evaluation with mammography is recommended. These results will be called to the ordering clinician or representative by the Radiologist Assistant, and communication documented in the PACS or zVision Dashboard. Electronically Signed: By: Marijo Conception, M.D. On: 09/22/2015 09:54    Assessment & Plan:   Problem List Items Addressed This Visit    History of diverticulitis of colon - Primary    No evidence of current inflammatory state by Ct.  Diet ary management discussed.         Irritable bowel syndrome    Improved symptoms since starting Bentyl.  FODMAP diet discussed.       Relevant Medications   dicyclomine (BENTYL) 20 MG tablet   Obesity    I have addressed  BMI and recommended wt loss of 10% of body weigh over the next 6 months using a low glycemic index diet and regular exercise a minimum of 5 days per week.        Ovarian cyst, right    Advised to have her gynecologist compare size with prior imaging by ultrasound 2 years ago.         A total of 25 minutes of face to face time was spent with patient more than half of which was spent in counselling about the above mentioned conditions  and coordination of care   I have discontinued Ms. Petrovic's Vitamin D (Ergocalciferol), phentermine, and sucralfate. I am also having her maintain her cetirizine, hyoscyamine, fluticasone, gabapentin, promethazine, albuterol, busPIRone, buPROPion, omeprazole, ondansetron, Vitamin D3, and dicyclomine.  Meds ordered this encounter  Medications  . Cholecalciferol (VITAMIN D3) 5000 units CAPS    Sig: Take 1 capsule by mouth daily.  [name] dicyclomine (BENTYL) 20 MG tablet    Sig: Take 1 tablet (20 mg total) by mouth 4 (four) times daily -  before meals and at bedtime.    Dispense:  120 tablet    Refill:  5    Medications Discontinued During This Encounter  Medication Reason  . dicyclomine (BENTYL) 20 MG tablet Reorder  . sucralfate (CARAFATE) 1 g tablet   . Vitamin D, Ergocalciferol, (DRISDOL) 50000 UNITS CAPS capsule   . phentermine (ADIPEX-P) 37.5 MG tablet     Follow-up: No Follow-up on file.   Crecencio Mc, MD

## 2015-09-27 NOTE — Patient Instructions (Addendum)
Continue  5000 Ius of D3 until mid April , then lower use to twice weekly.   Return  For repeat level in early May   Ask Dr [name] to compare the right ovarian cyst on the CT to his previous ultrasound  Refer to the handout "FODMAP" for foods to avoid because of IBS  Diverticulosis Diverticulosis is the condition that develops when small pouches (diverticula) form in the wall of your colon. Your colon, or large intestine, is where water is absorbed and stool is formed. The pouches form when the inside layer of your colon pushes through weak spots in the outer layers of your colon. CAUSES  No one knows exactly what causes diverticulosis. RISK FACTORS  Being older than 57. Your risk for this condition increases with age. Diverticulosis is rare in people younger than 40 years. By age 31, almost everyone has it.  Eating a low-fiber diet.  Being frequently constipated.  Being overweight.  Not getting enough exercise.  Smoking.  Taking over-the-counter pain medicines, like aspirin and ibuprofen. SYMPTOMS  Most people with diverticulosis do not have symptoms. DIAGNOSIS  Because diverticulosis often has no symptoms, health care providers often discover the condition during an exam for other colon problems. In many cases, a health care provider will diagnose diverticulosis while using a flexible scope to examine the colon (colonoscopy). TREATMENT  If you have never developed an infection related to diverticulosis, you may not need treatment. If you have had an infection before, treatment may include:  Eating more fruits, vegetables, and grains.  Taking a fiber supplement.  Taking a live bacteria supplement (probiotic).  Taking medicine to relax your colon. HOME CARE INSTRUCTIONS   Drink at least 6-8 glasses of water each day to prevent constipation.  Try not to strain when you have a bowel movement.  Keep all follow-up appointments. If you have had an infection  before:  Increase the fiber in your diet as directed by your health care provider or dietitian.  Take a dietary fiber supplement if your health care provider approves.  Only take medicines as directed by your health care provider. SEEK MEDICAL CARE IF:   You have abdominal pain.  You have bloating.  You have cramps.  You have not gone to the bathroom in 3 days. SEEK IMMEDIATE MEDICAL CARE IF:   Your pain gets worse.  Yourbloating becomes very bad.  You have a fever or chills, and your symptoms suddenly get worse.  You begin vomiting.  You have bowel movements that are bloody or black. MAKE SURE YOU:  Understand these instructions.  Will watch your condition.  Will get help right away if you are not doing well or get worse.   This information is not intended to replace advice given to you by your health care provider. Make sure you discuss any questions you have with your health care provider.   Document Released: 03/28/2004 Document Revised: 07/06/2013 Document Reviewed: 05/26/2013 Elsevier Interactive Patient Education [employer].

## 2015-09-28 ENCOUNTER — Telehealth: Payer: Self-pay | Admitting: Obstetrics and Gynecology

## 2015-09-28 DIAGNOSIS — N83209 Unspecified ovarian cyst, unspecified side: Secondary | ICD-10-CM

## 2015-09-28 NOTE — Telephone Encounter (Signed)
PT CALLED AND SHE HAD A CT SCAN LAST Friday FROM DR Derrel Nip, DUE TO HER HAVING STOMACH ISSUES AND THERE WAS A RIGHT OVARIAN CYST THAT SHOWED UP ON THE CT AND DR TULLO WANTED HER TO CALL us AND SEE IF YOU COULD TAKE A LOOK AT HER [place] SHE HAD WITH [place] BACK IN 2014 TO COMPARE THE CYST. PT STATED THAT DR Derrel Nip WAS JUST CONCERNED THAT SHE HAS ONE. AND TO LET THE PT KNOW IF SHE NEEDS TO COME IN AND BE SEEN OR NOT.

## 2015-09-30 DIAGNOSIS — N83201 Unspecified ovarian cyst, right side: Secondary | ICD-10-CM | POA: Insufficient documentation

## 2015-09-30 HISTORY — DX: Unspecified ovarian cyst, right side: N83.201

## 2015-09-30 NOTE — Assessment & Plan Note (Signed)
I have addressed  BMI and recommended wt loss of 10% of body weigh over the next 6 months using a low glycemic index diet and regular exercise a minimum of 5 days per week.   

## 2015-09-30 NOTE — Assessment & Plan Note (Signed)
Advised to have her gynecologist compare size with prior imaging by ultrasound 2 years ago.

## 2015-09-30 NOTE — Assessment & Plan Note (Signed)
No evidence of current inflammatory state by Ct.  Diet ary management discussed.

## 2015-09-30 NOTE — Assessment & Plan Note (Signed)
Improved symptoms since starting Bentyl.  FODMAP diet discussed.

## 2015-10-02 ENCOUNTER — Ambulatory Visit
Admission: RE | Admit: 2015-10-02 | Discharge: 2015-10-02 | Disposition: A | Discharge status: Home / Self Care | Payer: BLUE CROSS/BLUE SHIELD | Source: Ambulatory Visit | Attending: Obstetrics and Gynecology | Admitting: Obstetrics and Gynecology

## 2015-10-02 DIAGNOSIS — Z1239 Encounter for other screening for malignant neoplasm of breast: Secondary | ICD-10-CM

## 2015-10-02 DIAGNOSIS — Z1231 Encounter for screening mammogram for malignant neoplasm of breast: Secondary | ICD-10-CM | POA: Diagnosis present

## 2015-10-17 ENCOUNTER — Encounter: Payer: Self-pay | Admitting: Gastroenterology

## 2015-10-17 ENCOUNTER — Ambulatory Visit (INDEPENDENT_AMBULATORY_CARE_PROVIDER_SITE_OTHER): Payer: BLUE CROSS/BLUE SHIELD | Admitting: Gastroenterology

## 2015-10-17 VITALS — BP 122/78 | HR 103 | Temp 98.0°F | Ht 67.0 in | Wt 188.0 lb

## 2015-10-17 DIAGNOSIS — M5432 Sciatica, left side: Secondary | ICD-10-CM | POA: Diagnosis not present

## 2015-10-17 DIAGNOSIS — R1013 Epigastric pain: Secondary | ICD-10-CM | POA: Diagnosis not present

## 2015-10-17 DIAGNOSIS — M955 Acquired deformity of pelvis: Secondary | ICD-10-CM | POA: Diagnosis not present

## 2015-10-17 DIAGNOSIS — M9903 Segmental and somatic dysfunction of lumbar region: Secondary | ICD-10-CM | POA: Diagnosis not present

## 2015-10-17 DIAGNOSIS — M9905 Segmental and somatic dysfunction of pelvic region: Secondary | ICD-10-CM | POA: Diagnosis not present

## 2015-10-17 NOTE — Progress Notes (Signed)
Gastroenterology Consultation  Referring Provider:     Crecencio Mc, MD Primary Care Physician:  Crecencio Mc, MD Primary Gastroenterologist:  Dr. Allen Norris     Reason for Consultation:     Dyspepsia        HPI:   Colleen Washington is a 55 y.o. y/o female referred for consultation & management of Dyspepsia by Dr. Crecencio Mc, MD.  This patient comes in today after being in the emergency room for abdominal discomfort and dyspepsia. The patient states that she had gone out to dinner with her husband and had a steak and afterwards she did not feel well and states that she felt that she could just burp she would feel better but was unable to do that. The patient was told that she had ulcers and started on Carafate. The patient states the Carafate made her feel worse. The patient has a history of irritable bowel syndrome with alternating diarrhea and constipation. She states that sometimes when she goes out to dinner with her husband and she is so worried that she will have to use the bathroom that she starts to be suffering from her irritable bowel syndrome more and end up with diarrhea. There is no report of any black stools or bloody stools. She does report having a family history of colon cancer but it was with her father at the age of 39. There is no report of any polyps or cancer and any other family members. The patient has not had any unexplained weight loss, fevers, chills, black stools or bloody stools. She reports that she has attacks like this one that brought her to the emergency department approximate once every year and sometimes once every 2 years.  Past Medical History  Diagnosis Date  . allergic rhinitis   . Depression   . History of diverticulitis of colon   . GERD (gastroesophageal reflux disease)   . Hyperlipidemia   . Irritable bowel syndrome   . H/O migraine   . Hypertension   . Herniated intervertebral disc   . Endometriosis   . IBS (irritable bowel syndrome)   . AR  (allergic rhinitis)   . Anxiety     Past Surgical History  Procedure Laterality Date  . Abdominal hysterectomy  2003    tvhuso  . Breast biopsy Left 08/2014    benign    Prior to Admission medications   Medication Sig Start Date End Date Taking? Authorizing Provider  albuterol (PROVENTIL HFA;VENTOLIN HFA) 108 (90 BASE) MCG/ACT inhaler Inhale 2 puffs into the lungs every 6 (six) hours as needed for wheezing or shortness of breath. 05/24/15  Yes Crecencio Mc, MD  buPROPion (WELLBUTRIN XL) 150 MG 24 hr tablet Take 1 tablet (150 mg total) by mouth daily. 05/24/15  Yes Crecencio Mc, MD  busPIRone (BUSPAR) 5 MG tablet Take 1 tablet (5 mg total) by mouth 3 (three) times daily. 05/24/15  Yes Crecencio Mc, MD  cetirizine (ZYRTEC) 10 MG tablet Take 10 mg by mouth daily. Reported on 09/12/2015   Yes Historical Provider, MD  Cholecalciferol (VITAMIN D3) 5000 units CAPS Take 1 capsule by mouth daily.   Yes Historical Provider, MD  fluticasone (FLONASE) 50 MCG/ACT nasal spray Place 2 sprays into both nostrils daily. 09/09/14  Yes Crecencio Mc, MD  hyoscyamine (LEVSIN SL) 0.125 MG SL tablet Place 1 tablet (0.125 mg total) under the tongue every 4 (four) hours as needed. 09/09/14  Yes Crecencio Mc,  MD  omeprazole (PRILOSEC) 40 MG capsule Take 1 capsule (40 mg total) by mouth 2 (two) times daily. 05/24/15  Yes Crecencio Mc, MD  ondansetron (ZOFRAN) 4 MG tablet TAKE ONE TABLET EVERY 8 HOURS AS NEEDED FOR NAUSEA AND VOMITING 07/21/15  Yes Crecencio Mc, MD  phentermine (ADIPEX-P) 37.5 MG tablet  09/04/15  Yes Historical Provider, MD  Vitamin D, Ergocalciferol, (DRISDOL) 50000 units CAPS capsule  08/08/15  Yes Historical Provider, MD  dicyclomine (BENTYL) 20 MG tablet Take 1 tablet (20 mg total) by mouth 4 (four) times daily -  before meals and at bedtime. Patient not taking: Reported on 10/17/2015 09/27/15   Crecencio Mc, MD  gabapentin (NEURONTIN) 100 MG capsule Take 1 capsule (100 mg total) by mouth 3  (three) times daily. Patient not taking: Reported on 10/17/2015 02/21/15   Crecencio Mc, MD  promethazine (PHENERGAN) 12.5 MG tablet Take 12.5 mg by mouth every 6 (six) hours as needed for nausea or vomiting. Reported on 10/17/2015    Historical Provider, MD  sucralfate (CARAFATE) 1 g tablet Reported on 10/17/2015 09/04/15   Historical Provider, MD    Family History  Problem Relation Age of Onset  . Arthritis Mother   . Hyperlipidemia Mother   . Cancer Father     colon  . Arthritis Maternal Grandmother   . Hyperlipidemia Maternal Grandfather   . Diabetes Brother   . Heart disease Neg Hx   . Breast cancer Neg Hx   . Colon cancer Neg Hx      Social History  Substance Use Topics  . Smoking status: Former Smoker -- 0.00 packs/day for 20 years    Types: Cigarettes  . Smokeless tobacco: Never Used  . Alcohol Use: 0.0 oz/week    0 Standard drinks or equivalent per week     Comment: occas    Allergies as of 10/17/2015 - Review Complete 10/17/2015  Allergen Reaction Noted  . Compazine [prochlorperazine] Swelling 03/04/2012    Review of Systems:    All systems reviewed and negative except where noted in HPI.   Physical Exam:  BP 122/78 mmHg  Pulse 103  Temp(Src) 98 F (36.7 C) (Oral)  Ht 5\' 7"  (1.702 m)  Wt 188 lb (85.276 kg)  BMI 29.44 kg/m2 No LMP recorded. Patient has had a hysterectomy. Psych:  Alert and cooperative. Normal mood and affect. General:   Alert,  Well-developed, well-nourished, pleasant and cooperative in NAD Head:  Normocephalic and atraumatic. Eyes:  Sclera clear, no icterus.   Conjunctiva pink. Ears:  Normal auditory acuity. Nose:  No deformity, discharge, or lesions. Mouth:  No deformity or lesions,oropharynx pink & moist. Neck:  Supple; no masses or thyromegaly. Lungs:  Respirations even and unlabored.  Clear throughout to auscultation.   No wheezes, crackles, or rhonchi. No acute distress. Heart:  Regular rate and rhythm; no murmurs, clicks, rubs, or  gallops. Abdomen:  Normal bowel sounds.  No bruits.  Soft, non-tender and non-distended without masses, hepatosplenomegaly or hernias noted.  No guarding or rebound tenderness.  Negative Carnett sign.   Rectal:  Deferred.  Msk:  Symmetrical without gross deformities.  Good, equal movement & strength bilaterally. Pulses:  Normal pulses noted. Extremities:  No clubbing or edema.  No cyanosis. Neurologic:  Alert and oriented x3;  grossly normal neurologically. Skin:  Intact without significant lesions or rashes.  No jaundice. Lymph Nodes:  No significant cervical adenopathy. Psych:  Alert and cooperative. Normal mood and affect.  Imaging Studies:  Ct Abdomen Pelvis W Contrast  09/25/2015  ADDENDUM REPORT: 09/25/2015 09:30 ADDENDUM: The 2.6 cm oval density in the lateral left breast corresponds to a mass biopsied under ultrasound guidance on 09/06/2014 with benign pathological results. Therefore, this does not require further evaluation. The patient is past due for her previously recommended bilateral screening mammogram. Therefore, a bilateral screening mammogram is recommended at this time. Electronically Signed   By: Claudie Revering M.D.   On: 09/25/2015 09:30  09/25/2015  CLINICAL DATA:  Epigastric abdominal pain for 4 months. Nausea, diarrhea. EXAM: CT ABDOMEN AND PELVIS WITH CONTRAST TECHNIQUE: Multidetector CT imaging of the abdomen and pelvis was performed using the standard protocol following bolus administration of intravenous contrast. CONTRAST:  181mL OMNIPAQUE IOHEXOL 350 MG/ML SOLN COMPARISON:  None. FINDINGS: Visualized lung bases are unremarkable. 2.6 cm soft tissue density in left breast is noted. No significant osseous abnormality is noted. No gallstones are noted. The liver, spleen and pancreas appear normal. Adrenal glands and kidneys appear normal. No hydronephrosis or renal obstruction is noted. No renal or ureteral calculi are noted. There is no evidence of bowel obstruction. The  appendix appears normal. No abnormal fluid collection is noted. Sigmoid diverticulosis is noted without inflammation. The abdominal aorta appears normal. Status post hysterectomy. Left ovary appears normal. 2.8 cm right ovarian cyst is noted. No significant adenopathy is noted. Urinary bladder is decompressed. IMPRESSION: Sigmoid diverticulosis is noted without inflammation. 2.8 cm right ovarian cyst is noted.  Status post hysterectomy. No acute abnormality is noted in the abdomen or pelvis. 2.6 cm rounded soft tissue density is noted in the left breast. Further evaluation with mammography is recommended. These results will be called to the ordering clinician or representative by the Radiologist Assistant, and communication documented in the PACS or zVision Dashboard. Electronically Signed: By: Marijo Conception, M.D. On: 09/22/2015 09:54   Mm Digital Screening Bilateral  10/03/2015  CLINICAL DATA:  Screening. EXAM: DIGITAL SCREENING BILATERAL MAMMOGRAM WITH CAD COMPARISON:  Previous exam(s). ACR Breast Density Category b: There are scattered areas of fibroglandular density. FINDINGS: There are no findings suspicious for malignancy. Images were processed with CAD. IMPRESSION: No mammographic evidence of malignancy. A result letter of this screening mammogram will be mailed directly to the patient. RECOMMENDATION: Screening mammogram in one year. (Code:SM-B-01Y) BI-RADS CATEGORY  1: Negative. Electronically Signed   By: Claudie Revering M.D.   On: 10/03/2015 09:40    Assessment and Plan:   Colleen Washington is a 55 y.o. y/o female who comes in with dyspepsia and abdominal pain with a visit to the ER where she was told she had ulcers. The patient was put on Carafate and she states it made her feel worse. The patient is now on omeprazole twice a day with some acid breakthrough in the middle of night. The patient has been put on Dexilant 60 mg a day and has been told to take an approximate 5:00 in the evening so that she  has the highest concentration while she is sleeping. There is no need for a colonoscopy since her father had a colon cancer well past the age of 50. The patient is not due until 2021. As for her upper GI symptoms they're well controlled by the medication the present time and she has also been told to take Citrucel to help with her irritable bowel syndrome and she will also take Imodium) before going out to dinner with her husband so that she does not have episodes of diarrhea  while she is out. The patient has been explained the plan and agrees with it.   Note: This dictation was prepared with Dragon dictation along with smaller phrase technology. Any transcriptional errors that result from this process are unintentional.

## 2015-11-03 ENCOUNTER — Other Ambulatory Visit: Payer: Self-pay | Admitting: Nurse Practitioner

## 2015-11-14 DIAGNOSIS — M9905 Segmental and somatic dysfunction of pelvic region: Secondary | ICD-10-CM | POA: Diagnosis not present

## 2015-11-14 DIAGNOSIS — M955 Acquired deformity of pelvis: Secondary | ICD-10-CM | POA: Diagnosis not present

## 2015-11-14 DIAGNOSIS — M5432 Sciatica, left side: Secondary | ICD-10-CM | POA: Diagnosis not present

## 2015-11-14 DIAGNOSIS — M9903 Segmental and somatic dysfunction of lumbar region: Secondary | ICD-10-CM | POA: Diagnosis not present

## 2015-12-04 ENCOUNTER — Other Ambulatory Visit: Payer: Self-pay | Admitting: Internal Medicine

## 2015-12-05 MED ORDER — HYOSCYAMINE SULFATE 0.125 MG SL SUBL: 0.1250 mg | SUBLINGUAL_TABLET | SUBLINGUAL | Status: DC | PRN

## 2015-12-05 NOTE — Addendum Note (Signed)
Addended by: Westley Hummer B on: 12/05/2015 08:33 AM   Modules accepted: Orders

## 2015-12-07 NOTE — Telephone Encounter (Signed)
Pt aware. U/s scheduled for 12/19/15. Will contact pt with results.

## 2015-12-07 NOTE — Addendum Note (Signed)
Addended by: Elouise Munroe on: 12/07/2015 08:35 AM   Modules accepted: Orders

## 2015-12-13 DIAGNOSIS — M9905 Segmental and somatic dysfunction of pelvic region: Secondary | ICD-10-CM | POA: Diagnosis not present

## 2015-12-13 DIAGNOSIS — M955 Acquired deformity of pelvis: Secondary | ICD-10-CM | POA: Diagnosis not present

## 2015-12-13 DIAGNOSIS — M9903 Segmental and somatic dysfunction of lumbar region: Secondary | ICD-10-CM | POA: Diagnosis not present

## 2015-12-13 DIAGNOSIS — M5432 Sciatica, left side: Secondary | ICD-10-CM | POA: Diagnosis not present

## 2015-12-14 ENCOUNTER — Encounter: Payer: Self-pay | Admitting: Physician Assistant

## 2015-12-14 ENCOUNTER — Ambulatory Visit: Payer: Self-pay | Admitting: Family

## 2015-12-14 VITALS — BP 110/84 | HR 84 | Temp 98.3°F

## 2015-12-14 DIAGNOSIS — J309 Allergic rhinitis, unspecified: Secondary | ICD-10-CM

## 2015-12-14 DIAGNOSIS — Z299 Encounter for prophylactic measures, unspecified: Secondary | ICD-10-CM

## 2015-12-14 DIAGNOSIS — H65199 Other acute nonsuppurative otitis media, unspecified ear: Secondary | ICD-10-CM

## 2015-12-14 DIAGNOSIS — J00 Acute nasopharyngitis [common cold]: Secondary | ICD-10-CM

## 2015-12-14 MED ORDER — FLUCONAZOLE 150 MG PO TABS
ORAL_TABLET | ORAL | Status: DC
Start: 2015-12-14 — End: 2015-12-25

## 2015-12-14 MED ORDER — AMOXICILLIN 875 MG PO TABS
875.0000 mg | ORAL_TABLET | Freq: Two times a day (BID) | ORAL | Status: DC
Start: 2015-12-14 — End: 2015-12-25

## 2015-12-14 NOTE — Progress Notes (Signed)
S chronic allergies, now more acute sxs  , no fever , but increased thick yellow pnd, ST , Left ear pain Taking zyrtec and flonase  O/ alert pleasant VSS ENT left TM red superiorly,nasal turbinates allergic ,sinuses nontender pharynx increased pnd , neck supple heart rsr lungs clear A/ allergic and acute rhinitis, left OM P/ suppportive measures for allergy, change to claritin or allegra with sudafed. Nasal saline prior to flonase and prn. rx amoxicillan, diflucan. F/u prn not improving.

## 2015-12-19 ENCOUNTER — Ambulatory Visit (INDEPENDENT_AMBULATORY_CARE_PROVIDER_SITE_OTHER): Payer: BLUE CROSS/BLUE SHIELD

## 2015-12-19 DIAGNOSIS — N83209 Unspecified ovarian cyst, unspecified side: Secondary | ICD-10-CM

## 2015-12-20 ENCOUNTER — Telehealth: Payer: Self-pay | Admitting: Physician Assistant

## 2015-12-20 MED ORDER — BENZONATATE 200 MG PO CAPS: 200.0000 mg | ORAL_CAPSULE | Freq: Three times a day (TID) | ORAL | Status: DC | PRN

## 2015-12-20 NOTE — Telephone Encounter (Signed)
Colleen Washington saw her, not sure what was going on, I would give her tessalon perls if needed

## 2015-12-22 DIAGNOSIS — J209 Acute bronchitis, unspecified: Secondary | ICD-10-CM | POA: Diagnosis not present

## 2015-12-25 ENCOUNTER — Encounter: Payer: Self-pay | Admitting: Physician Assistant

## 2015-12-25 ENCOUNTER — Ambulatory Visit: Payer: Self-pay | Admitting: Physician Assistant

## 2015-12-25 VITALS — BP 130/90 | HR 84 | Temp 98.2°F

## 2015-12-25 DIAGNOSIS — J45901 Unspecified asthma with (acute) exacerbation: Secondary | ICD-10-CM

## 2015-12-25 DIAGNOSIS — K219 Gastro-esophageal reflux disease without esophagitis: Secondary | ICD-10-CM

## 2015-12-25 MED ORDER — PANTOPRAZOLE SODIUM 40 MG PO TBEC: 40.0000 mg | DELAYED_RELEASE_TABLET | Freq: Every day | ORAL | Status: DC

## 2015-12-25 MED ORDER — FLUTICASONE-SALMETEROL 115-21 MCG/ACT IN AERO: 2.0000 | INHALATION_SPRAY | Freq: Two times a day (BID) | RESPIRATORY_TRACT | Status: DC

## 2015-12-25 NOTE — Progress Notes (Signed)
S: continued cough, saw ENT last week and they put her on prednisone, been on it x 3d, still feels like something in her throat like drainage, makes her cough at night, no fever/chills, has been sweating a lot, hasn't seen pcp  O: vitals wnl, nad, ent wnl, neck supple no lymph, lungs c t a, cv rrr  A: asthma, gerd  P: advair hfa, switch omeprazole to protonix, f/u with pcp for eval

## 2015-12-29 ENCOUNTER — Ambulatory Visit: Payer: Self-pay | Admitting: Physician Assistant

## 2015-12-29 ENCOUNTER — Ambulatory Visit (INDEPENDENT_AMBULATORY_CARE_PROVIDER_SITE_OTHER): Payer: BLUE CROSS/BLUE SHIELD | Admitting: Family Medicine

## 2015-12-29 ENCOUNTER — Encounter: Payer: Self-pay | Admitting: Family Medicine

## 2015-12-29 VITALS — BP 116/72 | HR 92 | Temp 98.0°F | Ht 67.0 in | Wt 192.4 lb

## 2015-12-29 DIAGNOSIS — R05 Cough: Secondary | ICD-10-CM | POA: Diagnosis not present

## 2015-12-29 DIAGNOSIS — R059 Cough, unspecified: Secondary | ICD-10-CM

## 2015-12-29 MED ORDER — HYDROCOD POLST-CPM POLST ER 10-8 MG/5ML PO SUER: 5.0000 mL | Freq: Two times a day (BID) | ORAL | Status: DC | PRN

## 2015-12-29 NOTE — Assessment & Plan Note (Signed)
Suspect cough is likely post viral or post bacterial upper respiratory infection. No wheezing to indicate asthma or bronchitis exacerbation. Her reflux is improved with Protonix so would have expected cough to improve if cough was related to reflux. Her symptoms related to her prior upper respiratory infection have improved with the exception of the cough. Her vital signs are stable. Her lung exam is clear. Discussed supportive care and treatment of cough with Tussionex. Unsure if she truly needs Advair and she feels as though this may be making her voice a little hoarse so she will hold this. Albuterol as needed. Continue Protonix. She'll continue to monitor. If not improving over the next several weeks she'll follow-up. Given return precautions.

## 2015-12-29 NOTE — Progress Notes (Signed)
Patient ID: Colleen Washington, female   DOB: 1961-04-01, 55 y.o.   MRN: [id number]  Tommi Rumps, MD Phone: [phone]  Colleen Washington is a 55 y.o. female who presents today for same-day visit.  Patient reports cough over the last several weeks. Initially started with sinus congestion and postnasal drip. Had been blowing green mucus out of her nose. Was treated with amoxicillin following this. Had resolution of her congestion and green mucus, though cough persisted. She was evaluated by ear nose and throat and given prednisone. Notes this did not help. [facility] and was started on Advair and Protonix. Notes the Protonix has helped with her reflux. Her cough is not productive. She was previously sneezing and had a sore throat with this. No chest pain or shortness of breath. Cough is worse at night. She states she has a small history of asthma in the past. In the past she had had some warmth to her head and sweats though this was not associated with current illness. No fevers. She does feel tired though is unsure if this is related not sleeping due to her cough. Overall she does feel improved.  PMH: Smoker   ROS see history of present illness  Objective  Physical Exam Filed Vitals:   12/29/15 1554  BP: 116/72  Pulse: 92  Temp: 98 F (36.7 C)    BP Readings from Last 3 Encounters:  12/29/15 116/72  12/25/15 130/90  12/14/15 110/84   Wt Readings from Last 3 Encounters:  12/29/15 192 lb 6.4 oz (87.272 kg)  10/17/15 188 lb (85.276 kg)  09/27/15 191 lb 8 oz (86.864 kg)    Physical Exam  Constitutional: She is well-developed, well-nourished, and in no distress.  HENT:  Head: Normocephalic and atraumatic.  Right Ear: External ear normal.  Left Ear: External ear normal.  Mouth/Throat: Oropharynx is clear and moist. No oropharyngeal exudate.  Eyes: Conjunctivae are normal. Pupils are equal, round, and reactive to light.  Neck: Neck supple.  Cardiovascular:  Normal rate, regular rhythm and normal heart sounds.   Pulmonary/Chest: Effort normal and breath sounds normal.  Lymphadenopathy:    She has no cervical adenopathy.  Neurological: She is alert. Gait normal.  Skin: Skin is warm and dry. She is not diaphoretic.     Assessment/Plan: Please see individual problem list.  Cough Suspect cough is likely post viral or post bacterial upper respiratory infection. No wheezing to indicate asthma or bronchitis exacerbation. Her reflux is improved with Protonix so would have expected cough to improve if cough was related to reflux. Her symptoms related to her prior upper respiratory infection have improved with the exception of the cough. Her vital signs are stable. Her lung exam is clear. Discussed supportive care and treatment of cough with Tussionex. Unsure if she truly needs Advair and she feels as though this may be making her voice a little hoarse so she will hold this. Albuterol as needed. Continue Protonix. She'll continue to monitor. If not improving over the next several weeks she'll follow-up. Given return precautions.    No orders of the defined types were placed in this encounter.    Meds ordered this encounter  Medications  . chlorpheniramine-HYDROcodone (TUSSIONEX PENNKINETIC ER) 10-8 MG/5ML SUER    Sig: Take 5 mLs by mouth every 12 (twelve) hours as needed for cough.    Dispense:  140 mL    Refill:  0     Tommi Rumps, MD [facility]  [employer]

## 2015-12-29 NOTE — Patient Instructions (Signed)
Nice to meet you. Your cough is likely postviral in nature and we will treat your symptoms with Tussionex. Please continue the Protonix. You can use albuterol as needed. If you develop chest pain, shortness of breath, cough productive of blood, fevers, or any new or changing symptoms please seek medical attention.

## 2015-12-29 NOTE — Progress Notes (Signed)
Pre visit review using our clinic review tool, if applicable. No additional management support is needed unless otherwise documented below in the visit note. 

## 2015-12-30 ENCOUNTER — Other Ambulatory Visit: Payer: Self-pay | Admitting: Internal Medicine

## 2016-01-07 ENCOUNTER — Encounter: Payer: Self-pay | Admitting: Internal Medicine

## 2016-01-09 ENCOUNTER — Encounter: Payer: Self-pay | Admitting: Internal Medicine

## 2016-01-09 DIAGNOSIS — E559 Vitamin D deficiency, unspecified: Secondary | ICD-10-CM

## 2016-01-09 DIAGNOSIS — R5383 Other fatigue: Secondary | ICD-10-CM

## 2016-01-09 NOTE — Telephone Encounter (Signed)
Patient request B 12 , Vit D and TSH I have entered as future does patient need and appointment with you last appointment was 2/17, patient c/o fatigue.

## 2016-01-10 DIAGNOSIS — M9903 Segmental and somatic dysfunction of lumbar region: Secondary | ICD-10-CM | POA: Diagnosis not present

## 2016-01-10 DIAGNOSIS — M5432 Sciatica, left side: Secondary | ICD-10-CM | POA: Diagnosis not present

## 2016-01-10 DIAGNOSIS — M9905 Segmental and somatic dysfunction of pelvic region: Secondary | ICD-10-CM | POA: Diagnosis not present

## 2016-01-10 DIAGNOSIS — M955 Acquired deformity of pelvis: Secondary | ICD-10-CM | POA: Diagnosis not present

## 2016-01-10 NOTE — Telephone Encounter (Signed)
Your requested labs have been ordered   You do not need to fast.  Plesase schedule lab appointment only, and a 6 month follow up in August

## 2016-01-11 NOTE — Telephone Encounter (Signed)
Patient needs lab appointment and follow up in august,

## 2016-01-22 ENCOUNTER — Other Ambulatory Visit: Payer: Self-pay | Admitting: Internal Medicine

## 2016-02-12 DIAGNOSIS — M5432 Sciatica, left side: Secondary | ICD-10-CM | POA: Diagnosis not present

## 2016-02-12 DIAGNOSIS — M955 Acquired deformity of pelvis: Secondary | ICD-10-CM | POA: Diagnosis not present

## 2016-02-12 DIAGNOSIS — M9905 Segmental and somatic dysfunction of pelvic region: Secondary | ICD-10-CM | POA: Diagnosis not present

## 2016-02-12 DIAGNOSIS — M9903 Segmental and somatic dysfunction of lumbar region: Secondary | ICD-10-CM | POA: Diagnosis not present

## 2016-03-11 DIAGNOSIS — M9905 Segmental and somatic dysfunction of pelvic region: Secondary | ICD-10-CM | POA: Diagnosis not present

## 2016-03-11 DIAGNOSIS — M955 Acquired deformity of pelvis: Secondary | ICD-10-CM | POA: Diagnosis not present

## 2016-03-11 DIAGNOSIS — M9903 Segmental and somatic dysfunction of lumbar region: Secondary | ICD-10-CM | POA: Diagnosis not present

## 2016-03-11 DIAGNOSIS — M5432 Sciatica, left side: Secondary | ICD-10-CM | POA: Diagnosis not present

## 2016-03-19 DIAGNOSIS — M9903 Segmental and somatic dysfunction of lumbar region: Secondary | ICD-10-CM | POA: Diagnosis not present

## 2016-03-19 DIAGNOSIS — M955 Acquired deformity of pelvis: Secondary | ICD-10-CM | POA: Diagnosis not present

## 2016-03-19 DIAGNOSIS — M9905 Segmental and somatic dysfunction of pelvic region: Secondary | ICD-10-CM | POA: Diagnosis not present

## 2016-03-19 DIAGNOSIS — M5432 Sciatica, left side: Secondary | ICD-10-CM | POA: Diagnosis not present

## 2016-04-18 DIAGNOSIS — M9903 Segmental and somatic dysfunction of lumbar region: Secondary | ICD-10-CM | POA: Diagnosis not present

## 2016-04-18 DIAGNOSIS — M5432 Sciatica, left side: Secondary | ICD-10-CM | POA: Diagnosis not present

## 2016-04-18 DIAGNOSIS — M955 Acquired deformity of pelvis: Secondary | ICD-10-CM | POA: Diagnosis not present

## 2016-04-18 DIAGNOSIS — M9905 Segmental and somatic dysfunction of pelvic region: Secondary | ICD-10-CM | POA: Diagnosis not present

## 2016-06-03 DIAGNOSIS — N3001 Acute cystitis with hematuria: Secondary | ICD-10-CM | POA: Diagnosis not present

## 2016-06-03 DIAGNOSIS — R350 Frequency of micturition: Secondary | ICD-10-CM | POA: Diagnosis not present

## 2016-06-05 DIAGNOSIS — M9905 Segmental and somatic dysfunction of pelvic region: Secondary | ICD-10-CM | POA: Diagnosis not present

## 2016-06-05 DIAGNOSIS — M9903 Segmental and somatic dysfunction of lumbar region: Secondary | ICD-10-CM | POA: Diagnosis not present

## 2016-06-05 DIAGNOSIS — M5432 Sciatica, left side: Secondary | ICD-10-CM | POA: Diagnosis not present

## 2016-06-05 DIAGNOSIS — M955 Acquired deformity of pelvis: Secondary | ICD-10-CM | POA: Diagnosis not present

## 2016-06-10 ENCOUNTER — Encounter: Payer: Self-pay | Admitting: Internal Medicine

## 2016-06-17 ENCOUNTER — Other Ambulatory Visit: Payer: Self-pay | Admitting: Internal Medicine

## 2016-06-27 ENCOUNTER — Other Ambulatory Visit: Payer: Self-pay | Admitting: Internal Medicine

## 2016-07-09 DIAGNOSIS — M9903 Segmental and somatic dysfunction of lumbar region: Secondary | ICD-10-CM | POA: Diagnosis not present

## 2016-07-09 DIAGNOSIS — M955 Acquired deformity of pelvis: Secondary | ICD-10-CM | POA: Diagnosis not present

## 2016-07-09 DIAGNOSIS — M5432 Sciatica, left side: Secondary | ICD-10-CM | POA: Diagnosis not present

## 2016-07-09 DIAGNOSIS — M9905 Segmental and somatic dysfunction of pelvic region: Secondary | ICD-10-CM | POA: Diagnosis not present

## 2016-08-06 NOTE — Progress Notes (Signed)
Patient ID: Colleen Washington, female   DOB: [birth year], 56 y.o.   MRN: [id number] ANNUAL PREVENTATIVE CARE GYN  ENCOUNTER NOTE  Subjective:       Colleen Washington is a 56 y.o. No obstetric history on file. female here for a routine annual gynecologic exam.  Current complaints: 1. Back pain radiates at times; patient has achiness and flank with some radiation bilaterally. No UTI symptoms. 2. vaginal d/c- white no itch   Gynecologic History History of endometriosis; asymptomatic No LMP recorded. Patient has had a hysterectomy. Contraception: status post hysterectomy Status post TVH USO for endometriosis Last Pap: 02/2013 neg/neg. Results were: normal Last mammogram: 10/02/2015 birad 1. Results were: normal  Obstetric History Para 2012  Past Medical History:  Diagnosis Date  . allergic rhinitis   . Anxiety   . AR (allergic rhinitis)   . Depression   . Endometriosis   . GERD (gastroesophageal reflux disease)   . H/O migraine   . Herniated intervertebral disc   . History of diverticulitis of colon   . Hyperlipidemia   . Hypertension   . IBS (irritable bowel syndrome)   . Irritable bowel syndrome     Past Surgical History:  Procedure Laterality Date  . ABDOMINAL HYSTERECTOMY  2003   tvhuso  . BREAST BIOPSY Left 08/2014   benign    Current Outpatient Prescriptions on File Prior to Visit  Medication Sig Dispense Refill  . albuterol (PROVENTIL HFA;VENTOLIN HFA) 108 (90 BASE) MCG/ACT inhaler Inhale 2 puffs into the lungs every 6 (six) hours as needed for wheezing or shortness of breath. 3.7 g 5  . benzonatate (TESSALON) 200 MG capsule Take 1 capsule (200 mg total) by mouth 3 (three) times daily as needed for cough. 30 capsule 0  . buPROPion (WELLBUTRIN XL) 150 MG 24 hr tablet Take 1 tablet by mouth every day. PT MUST SCHEDULE OFFICE VISIT WITH PCP FOR FURTHER REFILLS 90 tablet 0  . busPIRone (BUSPAR) 5 MG tablet Take 1 tablet (5 mg total) by mouth 3 (three) times daily. 90 tablet 2  .  cetirizine (ZYRTEC) 10 MG tablet Take 10 mg by mouth daily. Reported on 09/12/2015    . chlorpheniramine-HYDROcodone (TUSSIONEX PENNKINETIC ER) 10-8 MG/5ML SUER Take 5 mLs by mouth every 12 (twelve) hours as needed for cough. 140 mL 0  . Cholecalciferol (VITAMIN D3) 5000 units CAPS Take 1 capsule by mouth daily.    [name] dicyclomine (BENTYL) 20 MG tablet Take 1 tablet (20 mg total) by mouth 4 (four) times daily -  before meals and at bedtime. 120 tablet 5  . fluticasone (FLONASE) 50 MCG/ACT nasal spray USE 2 SPRAYS EACH NOSTRIL DAILY 16 g 3  . fluticasone-salmeterol (ADVAIR HFA) 115-21 MCG/ACT inhaler Inhale 2 puffs into the lungs 2 (two) times daily. 1 Inhaler 12  . hyoscyamine (LEVSIN SL) 0.125 MG SL tablet PLACE 1 TABLET UNDER THE TONGUE EVERY 4 HOURS AS NEEDED 30 tablet 1  . ondansetron (ZOFRAN) 4 MG tablet TAKE ONE TABLET BY MOUTH EVERY 8 HOURS AS NEEDED FOR NAUSEA AND VOMITING 30 tablet 1  . pantoprazole (PROTONIX) 40 MG tablet Take 1 tablet (40 mg total) by mouth daily. 30 tablet 3  . phentermine (ADIPEX-P) 37.5 MG tablet     . promethazine (PHENERGAN) 12.5 MG tablet Take 12.5 mg by mouth every 6 (six) hours as needed for nausea or vomiting. Reported on 10/17/2015    . sucralfate (CARAFATE) 1 g tablet Reported on 12/14/2015    . Vitamin  D, Ergocalciferol, (DRISDOL) 50000 units CAPS capsule      No current facility-administered medications on file prior to visit.     Allergies  Allergen Reactions  . Compazine [Prochlorperazine] Swelling    Social History   Social History  . Marital status: Married    Spouse name: N/A  . Number of children: N/A  . Years of education: N/A   Occupational History  . Not on file.   Social History Main Topics  . Smoking status: Current Every Day Smoker    Packs/day: 0.00    Years: 20.00    Types: Cigarettes  . Smokeless tobacco: Never Used  . Alcohol use 0.0 oz/week     Comment: occas  . Drug use: No  . Sexual activity: Yes    Birth control/  protection: Surgical   Other Topics Concern  . Not on file   Social History Narrative  . No narrative on file    Family History  Problem Relation Age of Onset  . Arthritis Mother   . Hyperlipidemia Mother   . Cancer Father     colon  . Arthritis Maternal Grandmother   . Hyperlipidemia Maternal Grandfather   . Diabetes Brother   . Heart disease Neg Hx   . Breast cancer Neg Hx   . Colon cancer Neg Hx     The following portions of the patient's history were reviewed and updated as appropriate: allergies, current medications, past family history, past medical history, past social history, past surgical history and problem list.  Review of Systems ROS Review of Systems - General ROS: negative for - chills, fatigue, fever, hot flashes, night sweats, weight gain or weight loss Psychological ROS: negative for - anxiety, decreased libido, depression, mood swings, physical abuse or sexual abuse Ophthalmic ROS: negative for - blurry vision, eye pain or loss of vision ENT ROS: negative for - headaches, hearing change, visual changes or vocal changes Allergy and Immunology ROS: negative for - hives, itchy/watery eyes or seasonal allergies Hematological and Lymphatic ROS: negative for - bleeding problems, bruising, swollen lymph nodes or weight loss Endocrine ROS: negative for - galactorrhea, hair pattern changes, hot flashes, malaise/lethargy, mood swings, palpitations, polydipsia/polyuria, skin changes, temperature intolerance or unexpected weight changes Breast ROS: negative for - new or changing breast lumps or nipple discharge Respiratory ROS: negative for - cough or shortness of breath Cardiovascular ROS: negative for - chest pain, irregular heartbeat, palpitations or shortness of breath Gastrointestinal ROS: no abdominal pain, change in bowel habits, or black or bloody stools Genito-Urinary ROS: no dysuria, trouble voiding, or hematuria Musculoskeletal ROS: negative for - joint pain  or joint stiffness Neurological ROS: negative for - bowel and bladder control changes Dermatological ROS: negative for rash and skin lesion changes   Objective:   BP 119/76   Pulse 82   Ht 5\' 7"  (1.702 m)   Wt 193 lb 11.2 oz (87.9 kg)   BMI 30.34 kg/m  CONSTITUTIONAL: Well-developed, well-nourished female in no acute distress.  PSYCHIATRIC: Normal mood and affect. Normal behavior. Normal judgment and thought content. [place]: Alert and oriented to person, place, and time. Normal muscle tone coordination. No cranial nerve deficit noted. HENT:  Normocephalic, atraumatic, External right and left ear normal. Oropharynx is clear and moist EYES: Conjunctivae and EOM are normal. Pupils are equal, round, and reactive to light. No scleral icterus.  NECK: Normal range of motion, supple, no masses.  Normal thyroid.  SKIN: Skin is warm and dry. No rash noted. Not  diaphoretic. No erythema. No pallor. CARDIOVASCULAR: Normal heart rate noted, regular rhythm, no murmur. RESPIRATORY: Clear to auscultation bilaterally. Effort and breath sounds normal, no problems with respiration noted. BREASTS: Symmetric in size. No masses, skin changes, nipple drainage, or lymphadenopathy. ABDOMEN: Soft, normal bowel sounds, no distention noted.  No tenderness, rebound or guarding.  BLADDER: Normal PELVIC:  External Genitalia: Normal  BUS: Normal  Vagina: Normal; Good vault support; minimal white  discharge  Cervix: Surgically absent  Uterus: Surgically absent  Adnexa: Normal  RV: External Exam NormaI, No Rectal Masses and Normal Sphincter tone  MUSCULOSKELETAL: Normal range of motion. No tenderness.  No cyanosis, clubbing, or edema.  2+ distal pulses. LYMPHATIC: No Axillary, Supraclavicular, or Inguinal Adenopathy.    Assessment:   Annual gynecologic examination 56 y.o. Contraception: status post hysterectomy TVH USO bmi-30.34 History of endometriosis; ? asymptomatic. Anxiety, stable on Wellbutrin Low  back pain, achy; history of ovarian cyst  Plan:  Pap: Not needed Mammogram: Ordered Stool Guaiac Testing:  Ordered Labs: thru pcp Routine preventative health maintenance measures emphasized: counseling (consider employee assistance program), Exercise/Diet/Weight control, Tobacco Warnings and Alcohol/Substance use risks Calcium with vitamin D supplementation daily Weight loss goal of 10 pounds in the year Pelvic ultrasound Follow-up 1 week after ultrasound Return to [place], CMA  Brayton Mars, MD   Note: This dictation was prepared with Dragon dictation along with smaller phrase technology. Any transcriptional errors that result from this process are unintentional.

## 2016-08-07 DIAGNOSIS — M5432 Sciatica, left side: Secondary | ICD-10-CM | POA: Diagnosis not present

## 2016-08-07 DIAGNOSIS — M9903 Segmental and somatic dysfunction of lumbar region: Secondary | ICD-10-CM | POA: Diagnosis not present

## 2016-08-07 DIAGNOSIS — M955 Acquired deformity of pelvis: Secondary | ICD-10-CM | POA: Diagnosis not present

## 2016-08-07 DIAGNOSIS — M9905 Segmental and somatic dysfunction of pelvic region: Secondary | ICD-10-CM | POA: Diagnosis not present

## 2016-08-13 ENCOUNTER — Encounter: Payer: Self-pay | Admitting: Obstetrics and Gynecology

## 2016-08-13 ENCOUNTER — Ambulatory Visit (INDEPENDENT_AMBULATORY_CARE_PROVIDER_SITE_OTHER): Payer: BLUE CROSS/BLUE SHIELD | Admitting: Obstetrics and Gynecology

## 2016-08-13 ENCOUNTER — Ambulatory Visit (INDEPENDENT_AMBULATORY_CARE_PROVIDER_SITE_OTHER): Payer: BLUE CROSS/BLUE SHIELD

## 2016-08-13 VITALS — BP 119/76 | HR 82 | Ht 67.0 in | Wt 193.7 lb

## 2016-08-13 DIAGNOSIS — Z1231 Encounter for screening mammogram for malignant neoplasm of breast: Secondary | ICD-10-CM

## 2016-08-13 DIAGNOSIS — Z90710 Acquired absence of both cervix and uterus: Secondary | ICD-10-CM | POA: Diagnosis not present

## 2016-08-13 DIAGNOSIS — N809 Endometriosis, unspecified: Secondary | ICD-10-CM | POA: Diagnosis not present

## 2016-08-13 DIAGNOSIS — Z01419 Encounter for gynecological examination (general) (routine) without abnormal findings: Secondary | ICD-10-CM | POA: Diagnosis not present

## 2016-08-13 DIAGNOSIS — M545 Low back pain: Secondary | ICD-10-CM

## 2016-08-13 DIAGNOSIS — Z1211 Encounter for screening for malignant neoplasm of colon: Secondary | ICD-10-CM

## 2016-08-13 DIAGNOSIS — R638 Other symptoms and signs concerning food and fluid intake: Secondary | ICD-10-CM

## 2016-08-13 DIAGNOSIS — Z1239 Encounter for other screening for malignant neoplasm of breast: Secondary | ICD-10-CM

## 2016-08-13 LAB — POCT URINALYSIS DIPSTICK
Bilirubin, UA: NEGATIVE
Glucose, UA: NEGATIVE
Ketones, UA: NEGATIVE
Leukocytes, UA: NEGATIVE
Nitrite, UA: NEGATIVE
Protein, UA: NEGATIVE
Spec Grav, UA: 1.015
Urobilinogen, UA: NEGATIVE
pH, UA: 6

## 2016-08-13 NOTE — Patient Instructions (Addendum)
1. No Pap smear needed 2. Mammogram ordered 3. Calcium with vitamin D supplementation twice a day recommended 4. Continue with healthy eating and exercise. Encourage Weight Loss over One Year 5. Pelvic Ultrasound Is Ordered for Evaluation of Back Pain and History of Right Ovarian Cyst/Endometriosis 6. Return in 1 Week after Ultrasound for Further Management Planning 7. Smoking Cessation Encouraged 8. Return in 1 Year for Physical  Health Maintenance for Postmenopausal Women Introduction Menopause is a normal process in which your reproductive ability comes to an end. This process happens gradually over a span of months to years, usually between the ages of 30 and 27. Menopause is complete when you have missed 12 consecutive menstrual periods. It is important to talk with your health care provider about some of the most common conditions that affect postmenopausal women, such as heart disease, cancer, and bone loss (osteoporosis). Adopting a healthy lifestyle and getting preventive care can help to promote your health and wellness. Those actions can also lower your chances of developing some of these common conditions. What should I know about menopause? During menopause, you may experience a number of symptoms, such as:  Moderate-to-severe hot flashes.  Night sweats.  Decrease in sex drive.  Mood swings.  Headaches.  Tiredness.  Irritability.  Memory problems.  Insomnia. Choosing to treat or not to treat menopausal changes is an individual decision that you make with your health care provider. What should I know about hormone replacement therapy and supplements? Hormone therapy products are effective for treating symptoms that are associated with menopause, such as hot flashes and night sweats. Hormone replacement carries certain risks, especially as you become older. If you are thinking about using estrogen or estrogen with progestin treatments, discuss the benefits and risks with  your health care provider. What should I know about heart disease and stroke? Heart disease, heart attack, and stroke become more likely as you age. This may be due, in part, to the hormonal changes that your body experiences during menopause. These can affect how your body processes dietary fats, triglycerides, and cholesterol. Heart attack and stroke are both medical emergencies. There are many things that you can do to help prevent heart disease and stroke:  Have your blood pressure checked at least every 1-2 years. High blood pressure causes heart disease and increases the risk of stroke.  If you are 9-15 years old, ask your health care provider if you should take aspirin to prevent a heart attack or a stroke.  Do not use any tobacco products, including cigarettes, chewing tobacco, or electronic cigarettes. If you need help quitting, ask your health care provider.  It is important to eat a healthy diet and maintain a healthy weight.  Be sure to include plenty of vegetables, fruits, low-fat dairy products, and lean protein.  Avoid eating foods that are high in solid fats, added sugars, or salt (sodium).  Get regular exercise. This is one of the most important things that you can do for your health.  Try to exercise for at least 150 minutes each week. The type of exercise that you do should increase your heart rate and make you sweat. This is known as moderate-intensity exercise.  Try to do strengthening exercises at least twice each week. Do these in addition to the moderate-intensity exercise.  Know your numbers.Ask your health care provider to check your cholesterol and your blood glucose. Continue to have your blood tested as directed by your health care provider. What should I know about  cancer screening? There are several types of cancer. Take the following steps to reduce your risk and to catch any cancer development as early as possible. Breast Cancer  Practice breast  self-awareness.  This means understanding how your breasts normally appear and feel.  It also means doing regular breast self-exams. Let your health care provider know about any changes, no matter how small.  If you are 78 or older, have a clinician do a breast exam (clinical breast exam or CBE) every year. Depending on your age, family history, and medical history, it may be recommended that you also have a yearly breast X-ray (mammogram).  If you have a family history of breast cancer, talk with your health care provider about genetic screening.  If you are at high risk for breast cancer, talk with your health care provider about having an MRI and a mammogram every year.  Breast cancer (BRCA) gene test is recommended for women who have family members with BRCA-related cancers. Results of the assessment will determine the need for genetic counseling and BRCA1 and for BRCA2 testing. BRCA-related cancers include these types:  Breast. This occurs in males or females.  Ovarian.  Tubal. This may also be called fallopian tube cancer.  Cancer of the abdominal or pelvic lining (peritoneal cancer).  Prostate.  Pancreatic. Cervical, Uterine, and Ovarian Cancer  Your health care provider may recommend that you be screened regularly for cancer of the pelvic organs. These include your ovaries, uterus, and vagina. This screening involves a pelvic exam, which includes checking for microscopic changes to the surface of your cervix (Pap test).  For women ages 21-65, health care providers may recommend a pelvic exam and a Pap test every three years. For women ages 106-65, they may recommend the Pap test and pelvic exam, combined with testing for human papilloma virus (HPV), every five years. Some types of HPV increase your risk of cervical cancer. Testing for HPV may also be done on women of any age who have unclear Pap test results.  Other health care providers may not recommend any screening for  nonpregnant women who are considered low risk for pelvic cancer and have no symptoms. Ask your health care provider if a screening pelvic exam is right for you.  If you have had past treatment for cervical cancer or a condition that could lead to cancer, you need Pap tests and screening for cancer for at least 20 years after your treatment. If Pap tests have been discontinued for you, your risk factors (such as having a new sexual partner) need to be reassessed to determine if you should start having screenings again. Some women have medical problems that increase the chance of getting cervical cancer. In these cases, your health care provider may recommend that you have screening and Pap tests more often.  If you have a family history of uterine cancer or ovarian cancer, talk with your health care provider about genetic screening.  If you have vaginal bleeding after reaching menopause, tell your health care provider.  There are currently no reliable tests available to screen for ovarian cancer. Lung Cancer  Lung cancer screening is recommended for adults 51-40 years old who are at high risk for lung cancer because of a history of smoking. A yearly low-dose CT scan of the lungs is recommended if you:  Currently smoke.  Have a history of at least 30 pack-years of smoking and you currently smoke or have quit within the past 15 years. A pack-year is  smoking an average of one pack of cigarettes per day for one year. Yearly screening should:  Continue until it has been 15 years since you quit.  Stop if you develop a health problem that would prevent you from having lung cancer treatment. Colorectal Cancer  This type of cancer can be detected and can often be prevented.  Routine colorectal cancer screening usually begins at age 50 and continues through age 42.  If you have risk factors for colon cancer, your health care provider may recommend that you be screened at an earlier age.  If you have  a family history of colorectal cancer, talk with your health care provider about genetic screening.  Your health care provider may also recommend using home test kits to check for hidden blood in your stool.  A small camera at the end of a tube can be used to examine your colon directly (sigmoidoscopy or colonoscopy). This is done to check for the earliest forms of colorectal cancer.  Direct examination of the colon should be repeated every 5-10 years until age 53. However, if early forms of precancerous polyps or small growths are found or if you have a family history or genetic risk for colorectal cancer, you may need to be screened more often. Skin Cancer  Check your skin from head to toe regularly.  Monitor any moles. Be sure to tell your health care provider:  About any new moles or changes in moles, especially if there is a change in a mole's shape or color.  If you have a mole that is larger than the size of a pencil eraser.  If any of your family members has a history of skin cancer, especially at a young age, talk with your health care provider about genetic screening.  Always use sunscreen. Apply sunscreen liberally and repeatedly throughout the day.  Whenever you are outside, protect yourself by wearing long sleeves, pants, a wide-brimmed hat, and sunglasses. What should I know about osteoporosis? Osteoporosis is a condition in which bone destruction happens more quickly than new bone creation. After menopause, you may be at an increased risk for osteoporosis. To help prevent osteoporosis or the bone fractures that can happen because of osteoporosis, the following is recommended:  If you are 59-14 years old, get at least 1,000 mg of calcium and at least 600 mg of vitamin D per day.  If you are older than age 46 but younger than age 65, get at least 1,200 mg of calcium and at least 600 mg of vitamin D per day.  If you are older than age 68, get at least 1,200 mg of calcium and  at least 800 mg of vitamin D per day. Smoking and excessive alcohol intake increase the risk of osteoporosis. Eat foods that are rich in calcium and vitamin D, and do weight-bearing exercises several times each week as directed by your health care provider. What should I know about how menopause affects my mental health? Depression may occur at any age, but it is more common as you become older. Common symptoms of depression include:  Low or sad mood.  Changes in sleep patterns.  Changes in appetite or eating patterns.  Feeling an overall lack of motivation or enjoyment of activities that you previously enjoyed.  Frequent crying spells. Talk with your health care provider if you think that you are experiencing depression. What should I know about immunizations? It is important that you get and maintain your immunizations. These include:  Tetanus,  diphtheria, and pertussis (Tdap) booster vaccine.  Influenza every year before the flu season begins.  Pneumonia vaccine.  Shingles vaccine. Your health care provider may also recommend other immunizations. This information is not intended to replace advice given to you by your health care provider. Make sure you discuss any questions you have with your health care provider. Document Released: 08/23/2005 Document Revised: 01/19/2016 Document Reviewed: 04/04/2015  2017 [employer]

## 2016-08-15 LAB — URINE CULTURE: Organism ID, Bacteria: NO GROWTH

## 2016-08-20 ENCOUNTER — Encounter: Payer: Self-pay | Admitting: Obstetrics and Gynecology

## 2016-08-21 ENCOUNTER — Other Ambulatory Visit: Payer: Self-pay

## 2016-08-21 MED ORDER — FLUCONAZOLE 150 MG PO TABS: 150.0000 mg | ORAL_TABLET | Freq: Every day | ORAL | 0 refills | Status: DC

## 2016-08-23 ENCOUNTER — Other Ambulatory Visit: Payer: Self-pay | Admitting: Internal Medicine

## 2016-08-29 ENCOUNTER — Encounter: Payer: Self-pay | Admitting: Obstetrics and Gynecology

## 2016-08-29 ENCOUNTER — Ambulatory Visit (INDEPENDENT_AMBULATORY_CARE_PROVIDER_SITE_OTHER): Payer: BLUE CROSS/BLUE SHIELD | Admitting: Obstetrics and Gynecology

## 2016-08-29 VITALS — BP 105/70 | HR 86 | Ht 67.0 in | Wt 194.7 lb

## 2016-08-29 DIAGNOSIS — M545 Low back pain: Secondary | ICD-10-CM | POA: Diagnosis not present

## 2016-08-29 DIAGNOSIS — F419 Anxiety disorder, unspecified: Secondary | ICD-10-CM

## 2016-08-29 DIAGNOSIS — R638 Other symptoms and signs concerning food and fluid intake: Secondary | ICD-10-CM | POA: Diagnosis not present

## 2016-08-29 NOTE — Patient Instructions (Addendum)
1. Return in 3 months for follow-up 2. Start to taper off of Wellbutrin. Take Wellbutrin every other day for 2 weeks, then take Wellbutrin twice a week for 2 weeks, then discontinue medication 3. Continue continue drinking water, maintaining regular exercise with stretching, and monitor weight loss. Continue eating healthy as discussed.   Exercising to Lose Weight Introduction Exercising can help you to lose weight. In order to lose weight through exercise, you need to do vigorous-intensity exercise. You can tell that you are exercising with vigorous intensity if you are breathing very hard and fast and cannot hold a conversation while exercising. Moderate-intensity exercise helps to maintain your current weight. You can tell that you are exercising at a moderate level if you have a higher heart rate and faster breathing, but you are still able to hold a conversation. How often should I exercise? Choose an activity that you enjoy and set realistic goals. Your health care provider can help you to make an activity plan that works for you. Exercise regularly as directed by your health care provider. This may include:  Doing resistance training twice each week, such as:  Push-ups.  Sit-ups.  Lifting weights.  Using resistance bands.  Doing a given intensity of exercise for a given amount of time. Choose from these options:  150 minutes of moderate-intensity exercise every week.  75 minutes of vigorous-intensity exercise every week.  A mix of moderate-intensity and vigorous-intensity exercise every week. Children, pregnant women, people who are out of shape, people who are overweight, and older adults may need to consult a health care provider for individual recommendations. If you have any sort of medical condition, be sure to consult your health care provider before starting a new exercise program. What are some activities that can help me to lose weight?  Walking at a rate of at least  4.5 miles an hour.  Jogging or running at a rate of 5 miles per hour.  Biking at a rate of at least 10 miles per hour.  Lap swimming.  Roller-skating or in-line skating.  Cross-country skiing.  Vigorous competitive sports, such as football, basketball, and soccer.  Jumping rope.  Aerobic dancing. How can I be more active in my day-to-day activities?  Use the stairs instead of the elevator.  Take a walk during your lunch break.  If you drive, park your car farther away from work or school.  If you take public transportation, get off one stop early and walk the rest of the way.  Make all of your phone calls while standing up and walking around.  Get up, stretch, and walk around every 30 minutes throughout the day. What guidelines should I follow while exercising?  Do not exercise so much that you hurt yourself, feel dizzy, or get very short of breath.  Consult your health care provider prior to starting a new exercise program.  Wear comfortable clothes and shoes with good support.  Drink plenty of water while you exercise to prevent dehydration or heat stroke. Body water is lost during exercise and must be replaced.  Work out until you breathe faster and your heart beats faster. This information is not intended to replace advice given to you by your health care provider. Make sure you discuss any questions you have with your health care provider. Document Released: 08/03/2010 Document Revised: 12/07/2015 Document Reviewed: 12/02/2013  2017 [employer]

## 2016-08-29 NOTE — Progress Notes (Signed)
Chief complaint: 1. Back pain 2. Weight gain 3. Anxiety  Colleen Washington presents for follow-up. Recent ultrasound of the pelvis was negative for abnormalities of the adnexa (multiple follicles are seen right ovary). Patient states that her back pain has improved recently with new onset exercise regimen. Patient complaint is ongoing weight gain. Patient is concerned about long-term use of Wellbutrin and would like to consider coming off medication. Anxiety symptoms are reasonably controlled at this time.  Past history, past surgical history, problem list, medications, and allergies are reviewed  OBJECTIVE: BP 105/70   Pulse 86   Ht 5\' 7"  (1.702 m)   Wt 194 lb 11.2 oz (88.3 kg)   BMI 30.49 kg/m physical exam-deferred  ASSESSMENT: 1. Back pain, likely musculoskeletal, improving with new exercise regimen program 2. Normal ultrasound of pelvis 3. Anxiety, controlled; patient desires to wean off antidepressant.  PLAN: 1. Continue with regular exercise; dietary recommendations are given 2. Ultrasound results were reviewed 3. Decreased Wellbutrin to every other day dosing for 2 weeks; then reduced Wellbutrin to twice weekly dosing for 2 weeks; then discontinue medication 4. Return in 3 months for follow-up  A total of 15 minutes were spent face-to-face with the patient during this encounter and over half of that time dealt with counseling and coordination of care.  Brayton Mars, MD  Note: This dictation was prepared with Dragon dictation along with smaller phrase technology. Any transcriptional errors that result from this process are unintentional.

## 2016-09-04 DIAGNOSIS — M9905 Segmental and somatic dysfunction of pelvic region: Secondary | ICD-10-CM | POA: Diagnosis not present

## 2016-09-04 DIAGNOSIS — M5432 Sciatica, left side: Secondary | ICD-10-CM | POA: Diagnosis not present

## 2016-09-04 DIAGNOSIS — M9903 Segmental and somatic dysfunction of lumbar region: Secondary | ICD-10-CM | POA: Diagnosis not present

## 2016-09-04 DIAGNOSIS — M955 Acquired deformity of pelvis: Secondary | ICD-10-CM | POA: Diagnosis not present

## 2016-09-25 ENCOUNTER — Encounter: Payer: Self-pay | Admitting: Internal Medicine

## 2016-09-25 MED ORDER — PROMETHAZINE HCL 12.5 MG PO TABS: 12.5000 mg | ORAL_TABLET | Freq: Four times a day (QID) | ORAL | 1 refills | Status: DC | PRN

## 2016-09-26 ENCOUNTER — Telehealth: Payer: Self-pay | Admitting: Gastroenterology

## 2016-09-26 NOTE — Telephone Encounter (Signed)
Having a lot of bloating now and can hardly get her pants on. What can she do?

## 2016-09-27 MED ORDER — ESOMEPRAZOLE MAGNESIUM 40 MG PO CPDR: 40.0000 mg | DELAYED_RELEASE_CAPSULE | Freq: Every day | ORAL | 6 refills | Status: DC

## 2016-09-27 NOTE — Telephone Encounter (Signed)
Pt stated she had been eating a lot of fiber and as well as milk products. Advised her to reduce these for a couple of weeks to see if this improves her bloating. Advised her to start a probiotic as well. Pt has also requested to be switched back to the nexium as the protonix was not working.

## 2016-09-27 NOTE — Addendum Note (Signed)
Addended byGlennie Isle E on: 09/27/2016 02:56 PM   Modules accepted: Orders

## 2016-09-30 ENCOUNTER — Telehealth: Payer: Self-pay | Admitting: Internal Medicine

## 2016-09-30 MED ORDER — OSELTAMIVIR PHOSPHATE 75 MG PO CAPS: 75.0000 mg | ORAL_CAPSULE | Freq: Every day | ORAL | 0 refills | Status: DC

## 2016-09-30 NOTE — Telephone Encounter (Signed)
Patient advised of below and verbalized understanding.  

## 2016-09-30 NOTE — Telephone Encounter (Signed)
Yes,  tamiflu once daily x 10 days called to [facility]

## 2016-09-30 NOTE — Telephone Encounter (Signed)
Pt called and stated that her dad positive for the flu and was advised to call her doctor. Pt states that she has had the flu shot. Does she still need to take precautionary medication. Please advise, thank you!  Call pt [phone]

## 2016-10-02 DIAGNOSIS — M9903 Segmental and somatic dysfunction of lumbar region: Secondary | ICD-10-CM | POA: Diagnosis not present

## 2016-10-02 DIAGNOSIS — M955 Acquired deformity of pelvis: Secondary | ICD-10-CM | POA: Diagnosis not present

## 2016-10-02 DIAGNOSIS — M9905 Segmental and somatic dysfunction of pelvic region: Secondary | ICD-10-CM | POA: Diagnosis not present

## 2016-10-02 DIAGNOSIS — M5432 Sciatica, left side: Secondary | ICD-10-CM | POA: Diagnosis not present

## 2016-10-08 ENCOUNTER — Encounter: Payer: Self-pay | Admitting: Internal Medicine

## 2016-10-13 NOTE — Progress Notes (Signed)
Subjective:    Patient ID: Colleen Washington, female    DOB: [birth year], 56 y.o.   MRN: [id number]  CC: Colleen Washington is a 56 y.o. female who presents today for an acute visit.    HPI: CC: fatigue for past month, unchanged.  She also notes swollen glands on right side one month ago. Cousin noted neck 'looked bigger. '  Noticed after stopped wellbutrin which she was on 14 years however not sure if related.   Endorses feeling cold and feeling tired. Working out and not loosing weight. No trouble swallowing pills. h/o IBS- more diarrhea lately. No CP, palpitations, weight loss.   Sleeping well.   Would like thyroid panel. Family h/o of thryoid disease.        HISTORY:  Past Medical History:  Diagnosis Date  . allergic rhinitis   . Anxiety   . AR (allergic rhinitis)   . Depression   . Endometriosis   . GERD (gastroesophageal reflux disease)   . H/O migraine   . Herniated intervertebral disc   . History of diverticulitis of colon   . Hyperlipidemia   . Hypertension   . IBS (irritable bowel syndrome)   . Irritable bowel syndrome    Past Surgical History:  Procedure Laterality Date  . ABDOMINAL HYSTERECTOMY  2003   tvhuso  . BREAST BIOPSY Left 08/2014   benign   Family History  Problem Relation Age of Onset  . Arthritis Mother   . Hyperlipidemia Mother   . Cancer Father     colon  . Arthritis Maternal Grandmother   . Hyperlipidemia Maternal Grandfather   . Diabetes Brother   . Heart disease Neg Hx   . Breast cancer Neg Hx   . Colon cancer Neg Hx     Allergies: Compazine [prochlorperazine] Current Outpatient Prescriptions on File Prior to Visit  Medication Sig Dispense Refill  . buPROPion (WELLBUTRIN XL) 150 MG 24 hr tablet Take 1 tablet by mouth every day. PT MUST SCHEDULE OFFICE VISIT WITH PCP FOR FURTHER REFILLS 90 tablet 0  . busPIRone (BUSPAR) 5 MG tablet Take 1 tablet (5 mg total) by mouth 3 (three) times daily. 90 tablet 2  . cetirizine (ZYRTEC) 10 MG  tablet Take 10 mg by mouth daily. Reported on 09/12/2015    . Cholecalciferol (VITAMIN D3) 5000 units CAPS Take 1 capsule by mouth daily.    [name] esomeprazole (NEXIUM) 40 MG capsule Take 1 capsule (40 mg total) by mouth daily before breakfast. 30 capsule 6  . fluticasone (FLONASE) 50 MCG/ACT nasal spray USE 2 SPRAYS EACH NOSTRIL DAILY 16 g 3  . hyoscyamine (LEVSIN SL) 0.125 MG SL tablet PLACE 1 TABLET UNDER THE TONGUE EVERY 4 HOURS AS NEEDED 30 tablet 1  . ondansetron (ZOFRAN) 4 MG tablet TAKE ONE TABLET BY MOUTH EVERY 8 HOURS AS NEEDED FOR NAUSEA AND VOMITING 30 tablet 1  . oseltamivir (TAMIFLU) 75 MG capsule Take 1 capsule (75 mg total) by mouth daily. 10 capsule 0  . pantoprazole (PROTONIX) 40 MG tablet Take 1 tablet (40 mg total) by mouth daily. 30 tablet 3  . promethazine (PHENERGAN) 12.5 MG tablet Take 1 tablet (12.5 mg total) by mouth every 6 (six) hours as needed for nausea or vomiting. Reported on 10/17/2015 30 tablet 1  . VENTOLIN HFA 108 (90 Base) MCG/ACT inhaler INHALE TWO PUFFS EVERY 6 HOURS AS NEEDEDWHEEZING OR SHORTNESS OF BREATH 18 g 0   No current facility-administered medications on file prior to visit.  Social History  Substance Use Topics  . Smoking status: Current Some Day Smoker    Packs/day: 0.00    Years: 20.00    Types: Cigarettes  . Smokeless tobacco: Never Used  . Alcohol use 0.0 oz/week     Comment: occas    Review of Systems  Constitutional: Positive for fatigue. Negative for chills and fever.  HENT: Negative for congestion, sinus pain, sinus pressure, sore throat and trouble swallowing.   Respiratory: Negative for cough.   Cardiovascular: Negative for chest pain and palpitations.  Gastrointestinal: Positive for diarrhea. Negative for nausea and vomiting.  Endocrine: Positive for cold intolerance. Negative for heat intolerance.      Objective:    BP 130/86   Pulse 75   Temp 97.5 F (36.4 C) (Oral)   Ht 5\' 7"  (1.702 m)   Wt 199 lb 6.4 oz (90.4 kg)    SpO2 96%   BMI 31.23 kg/m   Wt Readings from Last 3 Encounters:  10/14/16 199 lb 6.4 oz (90.4 kg)  08/29/16 194 lb 11.2 oz (88.3 kg)  08/13/16 193 lb 11.2 oz (87.9 kg)    Physical Exam  Constitutional: She appears well-developed and well-nourished.  Eyes: Conjunctivae are normal.  Neck: Thyromegaly present. No thyroid mass present.  Symmetrically enlarged  Cardiovascular: Normal rate, regular rhythm, normal heart sounds and normal pulses.   Pulmonary/Chest: Effort normal and breath sounds normal. She has no wheezes. She has no rhonchi. She has no rales.  Neurological: She is alert.  Skin: Skin is warm and dry.  Psychiatric: She has a normal mood and affect. Her speech is normal and behavior is normal. Thought content normal.  Vitals reviewed.      Assessment & Plan:   1. Other fatigue Etiology unclear at this time for fatigue. Thyroid did appear enlarged. Pending [place] and lab work to evaluate for fatigue. If work up is unrevealing, patient and I have already discussed, she will return to discuss fatigue and difficultly loosing weight.  - TSH - T4, free - T3, free - Comprehensive metabolic panel - CBC with Differential/Platelet - Hemoglobin A1c - VITAMIN D 25 Hydroxy (Vit-D Deficiency, Fractures) - US THYROID; Future    I am having Ms. Zwick maintain her cetirizine, busPIRone, Vitamin D3, pantoprazole, fluticasone, ondansetron, hyoscyamine, buPROPion, VENTOLIN HFA, promethazine, esomeprazole, and oseltamivir.   No orders of the defined types were placed in this encounter.   Return precautions given.   Risks, benefits, and alternatives of the medications and treatment plan prescribed today were discussed, and patient expressed understanding.   Education regarding symptom management and diagnosis given to patient on AVS.  Continue to follow with TULLO, Aris Everts, MD for routine health maintenance.   [name] and I agreed with plan.   Mable Paris, FNP

## 2016-10-14 ENCOUNTER — Encounter: Payer: Self-pay | Admitting: Family

## 2016-10-14 ENCOUNTER — Ambulatory Visit (INDEPENDENT_AMBULATORY_CARE_PROVIDER_SITE_OTHER): Payer: BLUE CROSS/BLUE SHIELD | Admitting: Family

## 2016-10-14 VITALS — BP 130/86 | HR 75 | Temp 97.5°F | Ht 67.0 in | Wt 199.4 lb

## 2016-10-14 DIAGNOSIS — R5383 Other fatigue: Secondary | ICD-10-CM | POA: Diagnosis not present

## 2016-10-14 LAB — CBC WITH DIFFERENTIAL/PLATELET
Basophils Absolute: 0 cells/uL (ref 0–200)
Basophils Relative: 0 %
Eosinophils Absolute: 350 cells/uL (ref 15–500)
Eosinophils Relative: 5 %
HCT: 40 % (ref 35.0–45.0)
Hemoglobin: 13.1 g/dL (ref 11.7–15.5)
Lymphocytes Relative: 42 %
Lymphs Abs: 2940 cells/uL (ref 850–3900)
MCH: 28.4 pg (ref 27.0–33.0)
MCHC: 32.8 g/dL (ref 32.0–36.0)
MCV: 86.6 fL (ref 80.0–100.0)
MPV: 11.1 fL (ref 7.5–12.5)
Monocytes Absolute: 560 cells/uL (ref 200–950)
Monocytes Relative: 8 %
Neutro Abs: 3150 cells/uL (ref 1500–7800)
Neutrophils Relative %: 45 %
Platelets: 203 10*3/uL (ref 140–400)
RBC: 4.62 MIL/uL (ref 3.80–5.10)
RDW: 13.8 % (ref 11.0–15.0)
WBC: 7 10*3/uL (ref 3.8–10.8)

## 2016-10-14 LAB — COMPREHENSIVE METABOLIC PANEL
ALT: 10 U/L (ref 6–29)
AST: 13 U/L (ref 10–35)
Albumin: 4 g/dL (ref 3.6–5.1)
Alkaline Phosphatase: 67 U/L (ref 33–130)
BUN: 12 mg/dL (ref 7–25)
CO2: 25 mmol/L (ref 20–31)
Calcium: 9.3 mg/dL (ref 8.6–10.4)
Chloride: 103 mmol/L (ref 98–110)
Creat: 0.85 mg/dL (ref 0.50–1.05)
Glucose, Bld: 91 mg/dL (ref 65–99)
Potassium: 3.9 mmol/L (ref 3.5–5.3)
Sodium: 137 mmol/L (ref 135–146)
Total Bilirubin: 0.4 mg/dL (ref 0.2–1.2)
Total Protein: 7 g/dL (ref 6.1–8.1)

## 2016-10-14 NOTE — Patient Instructions (Signed)
Labs  US thyroid  As we discussed, if labs /US are unrevealing, I want you come back and see either  Myself or Dr Derrel Nip for follow up.  Pleasure meeting you

## 2016-10-14 NOTE — Progress Notes (Signed)
Pre visit review using our clinic review tool, if applicable. No additional management support is needed unless otherwise documented below in the visit note. 

## 2016-10-15 ENCOUNTER — Encounter: Payer: Self-pay | Admitting: Family

## 2016-10-15 LAB — HEMOGLOBIN A1C
Hgb A1c MFr Bld: 5.2 % (ref <5.7)
Mean Plasma Glucose: 103 mg/dL

## 2016-10-15 LAB — TSH: TSH: 2.06 mIU/L

## 2016-10-15 LAB — T4, FREE: Free T4: 1.1 ng/dL (ref 0.8–1.8)

## 2016-10-15 LAB — T3, FREE: T3, Free: 3.1 pg/mL (ref 2.3–4.2)

## 2016-10-15 LAB — VITAMIN D 25 HYDROXY (VIT D DEFICIENCY, FRACTURES): Vit D, 25-Hydroxy: 22 ng/mL — ABNORMAL LOW (ref 30–100)

## 2016-10-16 IMAGING — CT CT ABD-PELV W/ CM
1 of 3 series · 14 of 32 positions shown, 19 images · IV contrast (omnipaque)
Comparison: None.

ADDENDUM:
The 2.6 cm oval density in the lateral left breast corresponds to a
mass biopsied under ultrasound guidance on 09/06/2014 with benign
pathological results. Therefore, this does not require further
evaluation. The patient is past due for her previously recommended
bilateral screening mammogram. Therefore, a bilateral screening
mammogram is recommended at this time.
CLINICAL DATA: Epigastric abdominal pain for 4 months. Nausea,
diarrhea.

EXAM:
CT ABDOMEN AND PELVIS WITH CONTRAST
TECHNIQUE: Multidetector CT imaging of the abdomen and pelvis was performed
using the standard protocol following bolus administration of
intravenous contrast.
CONTRAST:  100mL OMNIPAQUE IOHEXOL 350 MG/ML SOLN

[Series 2: axial st · axial · 0.71mm/px · z∈[-1112,-652]mm · 14 of 104 slices shown, 19 images]
[im 6/104  soft-tissue]
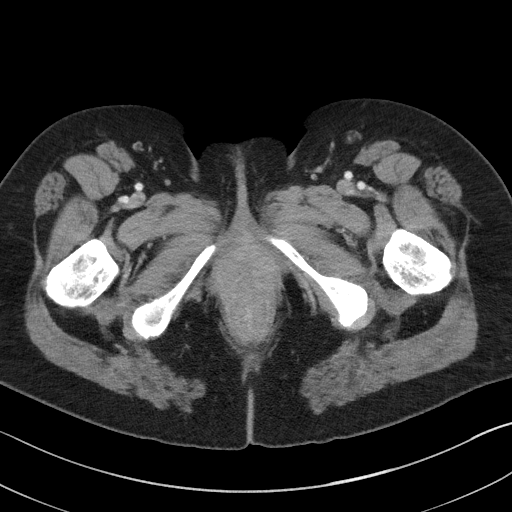
[im 6/104  bone]
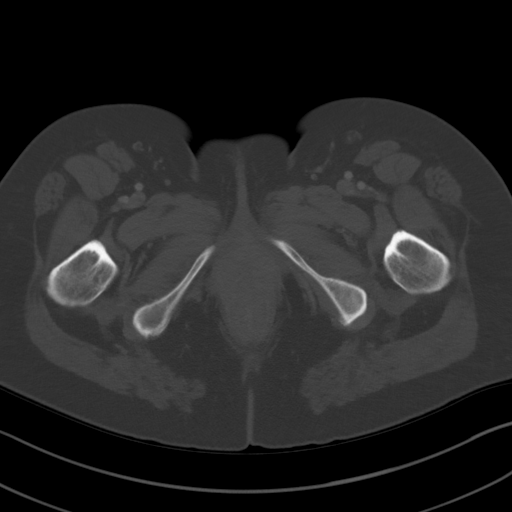
[im 17/104  soft-tissue]
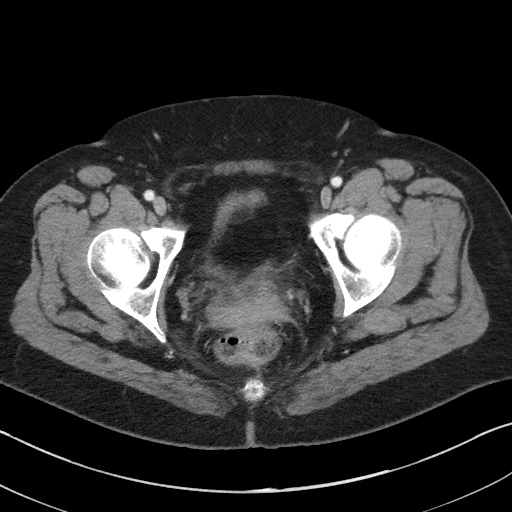
[im 22/104  soft-tissue]
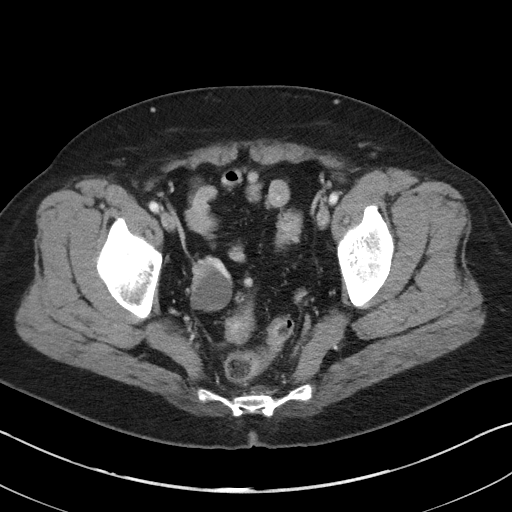
[im 28/104  soft-tissue]
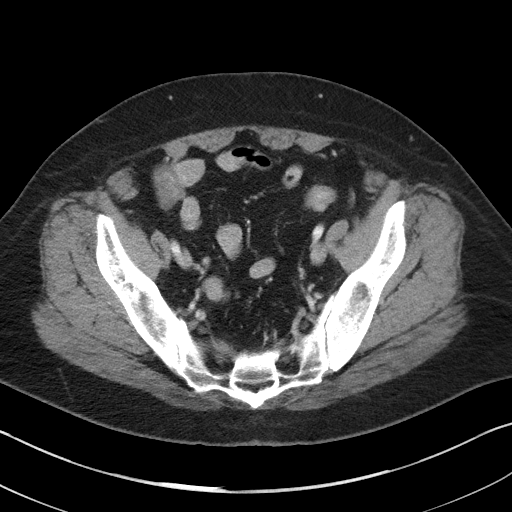
[im 38/104  soft-tissue]
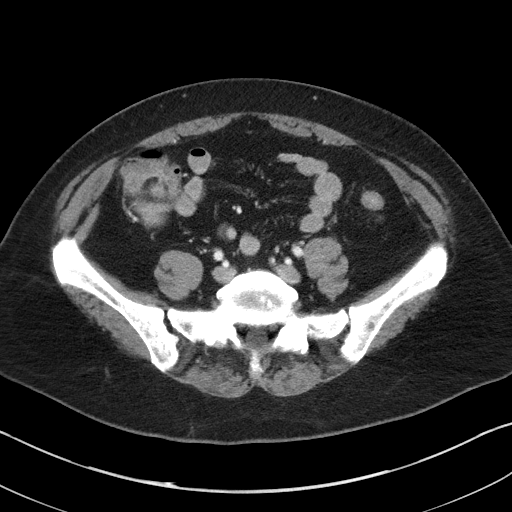
[im 44/104  soft-tissue]
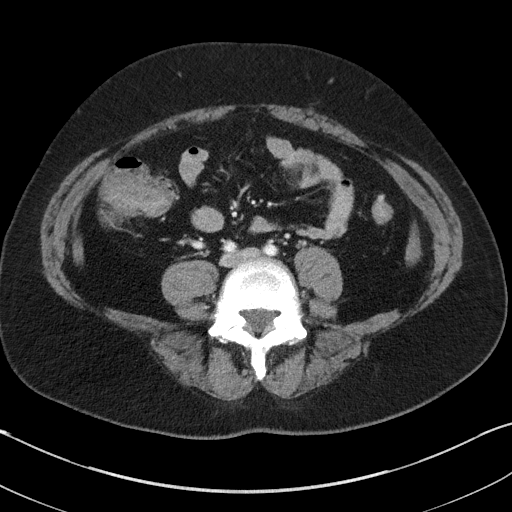
[im 55/104  soft-tissue]
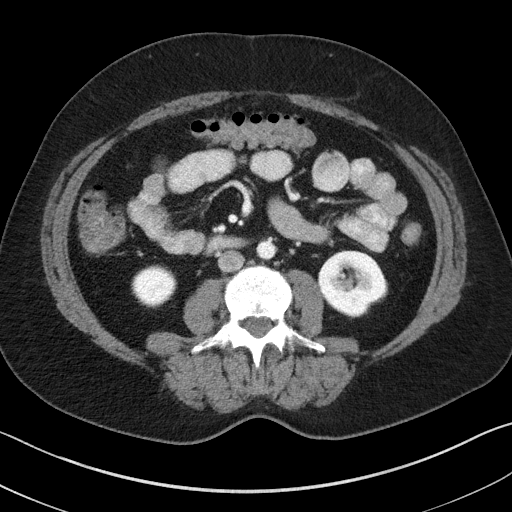
[im 60/104  soft-tissue]
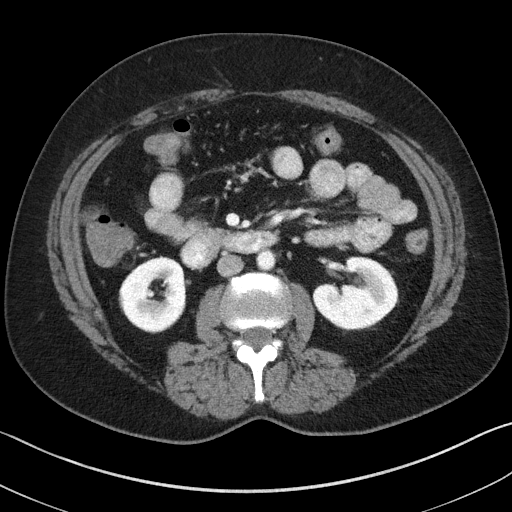
[im 66/104  soft-tissue]
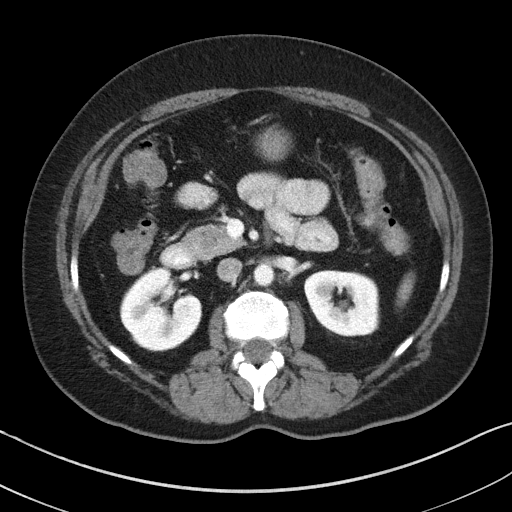
[im 66/104  bone]
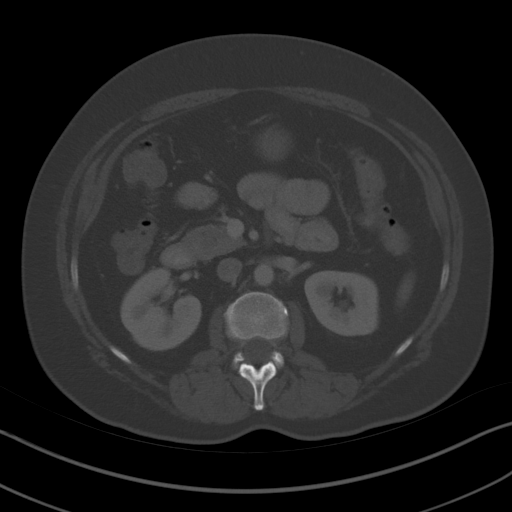
[im 76/104  soft-tissue]
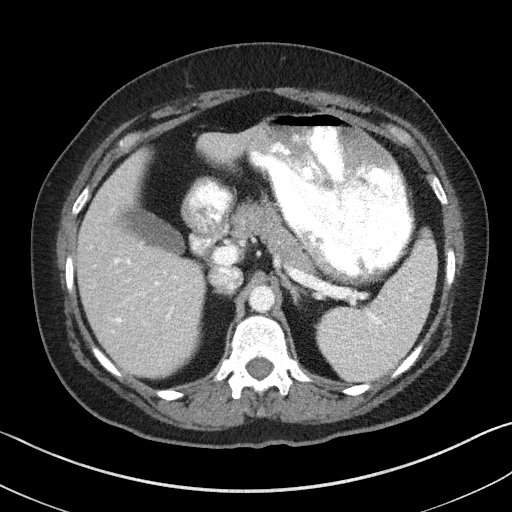
[im 82/104  soft-tissue]
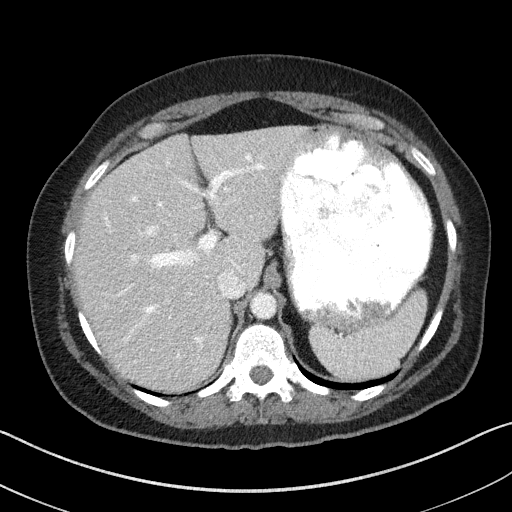
[im 82/104  lung]
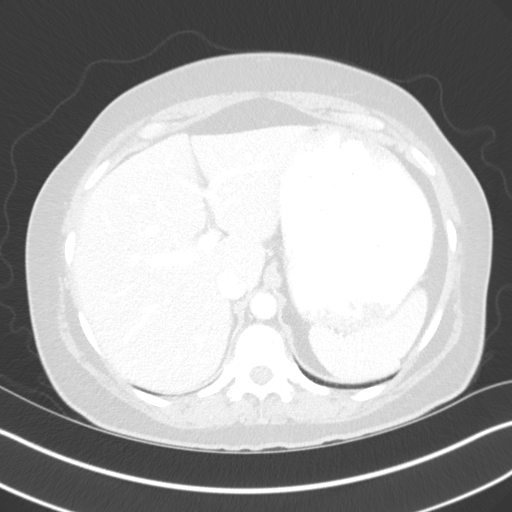
[im 87/104  soft-tissue]
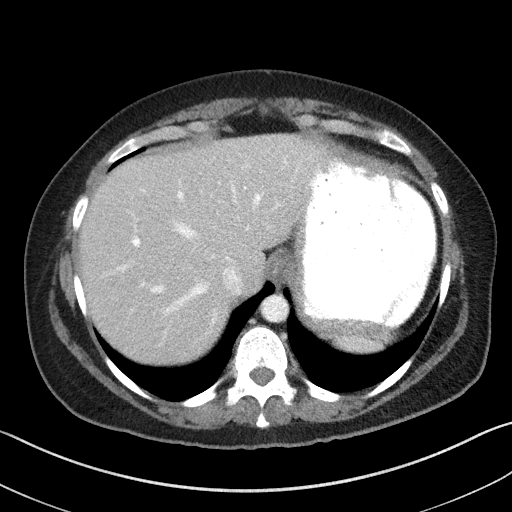
[im 87/104  lung]
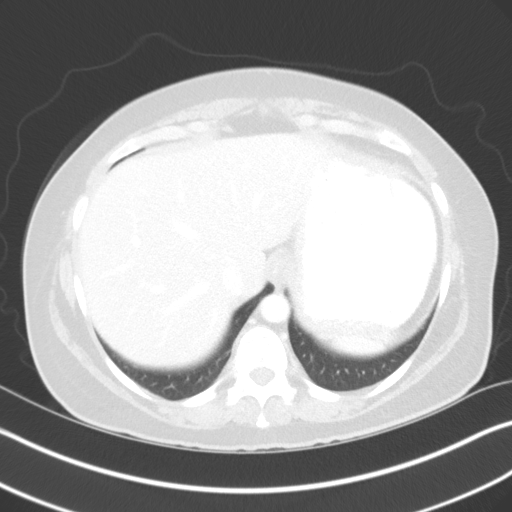
[im 93/104  lung]
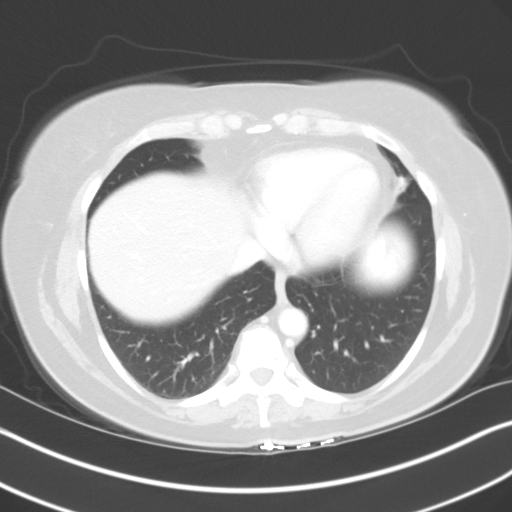
[im 98/104  soft-tissue]
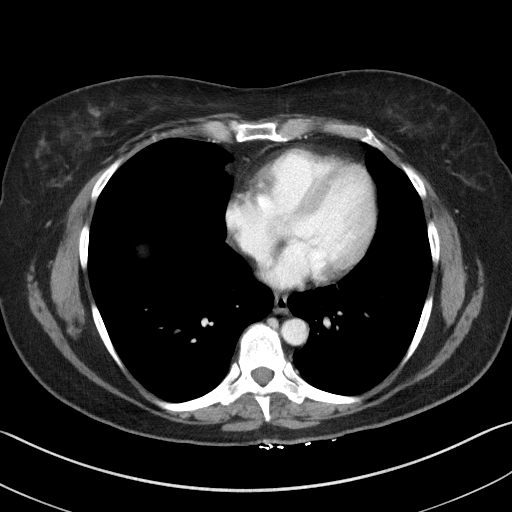
[im 98/104  lung]
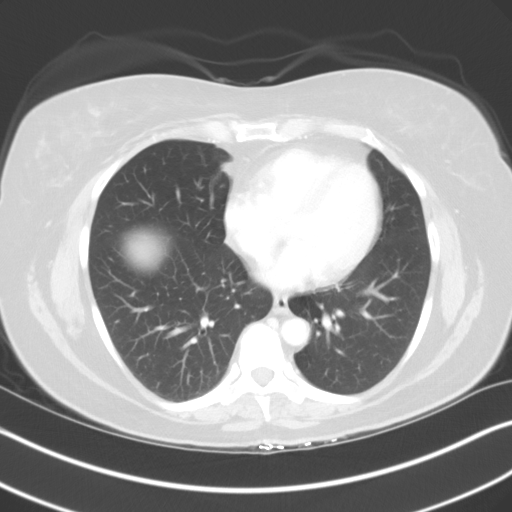

[14 of 32 positions shown; findings below may reference images not displayed]

FINDINGS: Visualized lung bases are unremarkable. 2.6 cm soft tissue density
in left breast is noted. No significant osseous abnormality is
noted.

No gallstones are noted. The liver, spleen and pancreas appear
normal. Adrenal glands and kidneys appear normal. No hydronephrosis
or renal obstruction is noted. No renal or ureteral calculi are
noted. There is no evidence of bowel obstruction. The appendix
appears normal. No abnormal fluid collection is noted. Sigmoid
diverticulosis is noted without inflammation. The abdominal aorta
appears normal. Status post hysterectomy. Left ovary appears normal.
2.8 cm right ovarian cyst is noted. No significant adenopathy is
noted. Urinary bladder is decompressed.
IMPRESSION: Sigmoid diverticulosis is noted without inflammation.

2.8 cm right ovarian cyst is noted.  Status post hysterectomy.

No acute abnormality is noted in the abdomen or pelvis.

2.6 cm rounded soft tissue density is noted in the left breast.
Further evaluation with mammography is recommended. These results
will be called to the ordering clinician or representative by the
Radiologist Assistant, and communication documented in the PACS or
zVision Dashboard.

## 2016-10-17 ENCOUNTER — Other Ambulatory Visit: Payer: Self-pay | Admitting: Internal Medicine

## 2016-10-17 ENCOUNTER — Telehealth: Payer: Self-pay | Admitting: Internal Medicine

## 2016-10-17 MED ORDER — FLUCONAZOLE 150 MG PO TABS: 150.0000 mg | ORAL_TABLET | Freq: Every day | ORAL | 0 refills | Status: DC

## 2016-10-17 MED ORDER — BUSPIRONE HCL 5 MG PO TABS: 5.0000 mg | ORAL_TABLET | Freq: Three times a day (TID) | ORAL | 5 refills | Status: DC

## 2016-10-17 NOTE — Telephone Encounter (Signed)
Pt called about now having a yeast infection from taking the Tamiflu. Pt states she needs something for it.  Pt also needs a refill for busPIRone (BUSPAR) 5 MG tablet  Pharmacy is [facility], [place]  Call pt @ [phone]. Thank you!

## 2016-10-17 NOTE — Telephone Encounter (Signed)
Please advise  Refilled: 05/24/2015 Last OV: 09/27/2015 Next OV: 10/28/2016

## 2016-10-17 NOTE — Telephone Encounter (Signed)
Both meds sent [facility] (buspirone refill, and fluconazole )

## 2016-10-23 ENCOUNTER — Ambulatory Visit: Payer: BLUE CROSS/BLUE SHIELD

## 2016-10-25 ENCOUNTER — Ambulatory Visit
Admission: RE | Admit: 2016-10-25 | Discharge: 2016-10-25 | Disposition: A | Discharge status: Home / Self Care | Payer: BLUE CROSS/BLUE SHIELD | Source: Ambulatory Visit | Attending: Family | Admitting: Family

## 2016-10-25 DIAGNOSIS — R5383 Other fatigue: Secondary | ICD-10-CM | POA: Diagnosis not present

## 2016-10-25 DIAGNOSIS — E041 Nontoxic single thyroid nodule: Secondary | ICD-10-CM | POA: Insufficient documentation

## 2016-10-28 ENCOUNTER — Telehealth: Payer: Self-pay | Admitting: Internal Medicine

## 2016-10-28 ENCOUNTER — Ambulatory Visit (INDEPENDENT_AMBULATORY_CARE_PROVIDER_SITE_OTHER): Payer: BLUE CROSS/BLUE SHIELD | Admitting: Family

## 2016-10-28 ENCOUNTER — Encounter: Payer: Self-pay | Admitting: Family

## 2016-10-28 ENCOUNTER — Telehealth: Payer: Self-pay | Admitting: Family

## 2016-10-28 VITALS — BP 110/86 | HR 79 | Temp 98.1°F | Ht 67.0 in | Wt 199.6 lb

## 2016-10-28 DIAGNOSIS — E669 Obesity, unspecified: Secondary | ICD-10-CM | POA: Diagnosis not present

## 2016-10-28 DIAGNOSIS — E041 Nontoxic single thyroid nodule: Secondary | ICD-10-CM | POA: Insufficient documentation

## 2016-10-28 HISTORY — DX: Nontoxic single thyroid nodule: E04.1

## 2016-10-28 NOTE — Assessment & Plan Note (Signed)
Discussed results. Patient understands to f/u in one year for repeat US.

## 2016-10-28 NOTE — Patient Instructions (Signed)
Glad to see you today  This is  Dr. Lupita Dawn  example of a  "Low GI"  Diet:  It will allow you to lose 4 to 8  lbs  per month if you follow it carefully.  Your goal with exercise is a minimum of 30 minutes of aerobic exercise 5 days per week (Walking does not count once it becomes easy!)    All of the foods can be found at grocery stores and in bulk at [employer].  The Atkins protein bars and shakes are available in more varieties at Target, [employer]Mart and [place].     7 AM Breakfast:  Choose from the following:  Low carbohydrate Protein  Shakes (I recommend the  Premier Protein chocolate shakes,  EAS AdvantEdge "Carb Control" shakes  Or the Atkins shakes all are under 3 net carbs)     a scrambled egg/bacon/cheese burrito made with Mission's "carb balance" whole wheat tortilla  (about 10 net carbs )  Regulatory affairs officer (basically a quiche without the pastry crust) that is eaten cold and very convenient way to get your eggs.  8 carbs)  If you make your own protein shakes, avoid bananas and pineapple,  And use low carb greek yogurt or original /unsweetened almond or soy milk    Avoid cereal and bananas, oatmeal and cream of wheat and grits. They are loaded with carbohydrates!   10 AM: high protein snack:  Protein bar by Atkins (the snack size, under 200 cal, usually < 6 net carbs).    A stick of cheese:  Around 1 carb,  100 cal     Dannon Light n Fit [place] Yogurt  (80 cal, 8 carbs)  Other so called "protein bars" and Greek yogurts tend to be loaded with carbohydrates.  Remember, in food advertising, the word "energy" is synonymous for " carbohydrate."  Lunch:   A Sandwich using the bread choices listed, Can use any  Eggs,  lunchmeat, grilled meat or canned tuna), avocado, regular mayo/mustard  and cheese.  A Salad using blue cheese, ranch,  Goddess or vinagrette,  Avoid taco shells, croutons or "confetti" and no "candied nuts" but regular nuts OK.   No pretzels, nabs  or  chips.  Pickles and miniature sweet peppers are a good low carb alternative that provide a "crunch"  The bread is the only source of carbohydrate in a sandwich and  can be decreased by trying some of the attached alternatives to traditional loaf bread   Avoid "Low fat dressings, as well as [place] dressings They are loaded with sugar!   3 PM/ Mid day  Snack:  Consider  1 ounce of  almonds, walnuts, pistachios, pecans, peanuts,  Macadamia nuts or a nut medley.  Avoid "granola and granola bars "  Mixed nuts are ok in moderation as long as there are no raisins,  cranberries or dried fruit.   KIND bars are OK if you get the low glycemic index variety   Try the prosciutto/mozzarella cheese sticks by Fiorruci  In deli /backery section   High protein      6 PM  Dinner:     Meat/fowl/fish with a green salad, and either broccoli, cauliflower, green beans, spinach, brussel sprouts or  Lima beans. DO NOT BREAD THE PROTEIN!!      There is a low carb pasta by Dreamfield's that is acceptable and tastes great: only 5 digestible carbs/serving.( All grocery stores but BJs carry it )  Several ready made meals are available low carb:   Try Michel Angelo's chicken piccata or chicken or eggplant parm over low carb pasta.(Lowes and BJs)   Marjory Lies Sanchez's "Carnitas" (pulled pork, no sauce,  0 carbs) or his beef pot roast to make a dinner burrito (at [employer]J's)  Pesto over low carb pasta (bj's sells a good quality pesto in the center refrigerated section of the deli   Try satueeing  Cheral Marker with mushroooms as a good side   [employer] Giant makes a mashed cauliflower that tastes like mashed potatoes  Whole wheat pasta is still full of digestible carbs and  Not as low in glycemic index as Dreamfield's.   Brown rice is still rice,  So skip the rice and noodles if you eat [place] or [place] (or at least limit to 1/2 cup)  9 PM snack :   Breyer's "low carb" fudgsicle or  ice cream bar (Carb Smart line), or   Weight Watcher's ice cream bar , or another "no sugar added" ice cream;  a serving of fresh berries/cherries with whipped cream   Cheese or DANNON'S LlGHT N FIT GREEK YOGURT  8 ounces of [place] unsweetened almond/cococunut milk    Treat yourself to a parfait made with whipped cream blueberiies, walnuts and vanilla greek yogurt  Avoid bananas, pineapple, grapes  and watermelon on a regular basis because they are high in sugar.  THINK OF THEM AS DESSERT  Remember that snack Substitutions should be less than 10 NET carbs per serving and meals < 20 carbs. Remember to subtract fiber grams to get the "net carbs."

## 2016-10-28 NOTE — Telephone Encounter (Signed)
I called pt and she states she is still having some issues so she will be coming in today. Thank you!

## 2016-10-28 NOTE — Telephone Encounter (Signed)
close

## 2016-10-28 NOTE — Progress Notes (Signed)
Subjective:    Patient ID: Colleen Washington, female    DOB: 1960-10-25, 56 y.o.   MRN: [id number]  CC: Colleen Washington is a 56 y.o. female who presents today for follow up.   HPI: US thyroid- would like results.   Frustrated by weight gain. Fatigue is slightly better.   Eating smoothie and Raisin brain for breakfast. Walks at work, approx 12,00 steps per day.  UTD colonoscopy      HISTORY:  Past Medical History:  Diagnosis Date  . allergic rhinitis   . Anxiety   . AR (allergic rhinitis)   . Depression   . Endometriosis   . GERD (gastroesophageal reflux disease)   . H/O migraine   . Herniated intervertebral disc   . History of diverticulitis of colon   . Hyperlipidemia   . Hypertension   . IBS (irritable bowel syndrome)   . Irritable bowel syndrome    Past Surgical History:  Procedure Laterality Date  . ABDOMINAL HYSTERECTOMY  2003   tvhuso  . BREAST BIOPSY Left 08/2014   benign   Family History  Problem Relation Age of Onset  . Arthritis Mother   . Hyperlipidemia Mother   . Cancer Father     colon  . Arthritis Maternal Grandmother   . Hyperlipidemia Maternal Grandfather   . Diabetes Brother   . Heart disease Neg Hx   . Breast cancer Neg Hx   . Colon cancer Neg Hx     Allergies: Compazine [prochlorperazine] Current Outpatient Prescriptions on File Prior to Visit  Medication Sig Dispense Refill  . busPIRone (BUSPAR) 5 MG tablet Take 1 tablet (5 mg total) by mouth 3 (three) times daily. 90 tablet 5  . cetirizine (ZYRTEC) 10 MG tablet Take 10 mg by mouth daily. Reported on 09/12/2015    . esomeprazole (NEXIUM) 40 MG capsule Take 1 capsule (40 mg total) by mouth daily before breakfast. 30 capsule 6  . fluconazole (DIFLUCAN) 150 MG tablet Take 1 tablet (150 mg total) by mouth daily. 2 tablet 0  . fluticasone (FLONASE) 50 MCG/ACT nasal spray USE 2 SPRAYS EACH NOSTRIL DAILY 16 g 3  . hyoscyamine (LEVSIN SL) 0.125 MG SL tablet PLACE 1 TABLET UNDER THE TONGUE EVERY 4  HOURS AS NEEDED 30 tablet 1  . pantoprazole (PROTONIX) 40 MG tablet Take 1 tablet (40 mg total) by mouth daily. 30 tablet 3  . promethazine (PHENERGAN) 12.5 MG tablet Take 1 tablet (12.5 mg total) by mouth every 6 (six) hours as needed for nausea or vomiting. Reported on 10/17/2015 30 tablet 1  . VENTOLIN HFA 108 (90 Base) MCG/ACT inhaler INHALE TWO PUFFS EVERY 6 HOURS AS NEEDEDWHEEZING OR SHORTNESS OF BREATH 18 g 0   No current facility-administered medications on file prior to visit.     Social History  Substance Use Topics  . Smoking status: Current Some Day Smoker    Packs/day: 0.00    Years: 20.00    Types: Cigarettes  . Smokeless tobacco: Never Used  . Alcohol use 0.0 oz/week     Comment: occas    Review of Systems  Constitutional: Negative for chills, fatigue and fever.  Respiratory: Negative for cough.   Cardiovascular: Negative for chest pain and palpitations.  Gastrointestinal: Negative for nausea and vomiting.      Objective:    BP 110/86   Pulse 79   Temp 98.1 F (36.7 C) (Oral)   Ht 5\' 7"  (1.702 m)   Wt 199 lb 9.6  oz (90.5 kg)   SpO2 96%   BMI 31.26 kg/m  BP Readings from Last 3 Encounters:  10/28/16 110/86  10/14/16 130/86  08/29/16 105/70   Wt Readings from Last 3 Encounters:  10/28/16 199 lb 9.6 oz (90.5 kg)  10/14/16 199 lb 6.4 oz (90.4 kg)  08/29/16 194 lb 11.2 oz (88.3 kg)    Physical Exam  Constitutional: She appears well-developed and well-nourished.  Eyes: Conjunctivae are normal.  Cardiovascular: Normal rate, regular rhythm, normal heart sounds and normal pulses.   Pulmonary/Chest: Effort normal and breath sounds normal. She has no wheezes. She has no rhonchi. She has no rales.  Neurological: She is alert.  Skin: Skin is warm and dry.  Psychiatric: She has a normal mood and affect. Her speech is normal and behavior is normal. Thought content normal.  Vitals reviewed.      Assessment & Plan:   Problem List Items Addressed This Visit        Endocrine   Thyroid nodule    Discussed results. Patient understands to f/u in one year for repeat US.         Other   Obesity - Primary    Suspect lack or exercise and eating habits contributory. Long discussion on dietary changes. Explained to patient that no obvious underlying medical reasons seen in her lab work besides vitamin D deficiency. Pending sleep study. Education and information provided on low glycemic foods given. Advised to follow up with PCP if persists.       Relevant Orders   Ambulatory referral to Sleep Studies       I am having Colleen Washington maintain her cetirizine, pantoprazole, fluticasone, hyoscyamine, VENTOLIN HFA, promethazine, esomeprazole, fluconazole, and busPIRone.   No orders of the defined types were placed in this encounter.   Return precautions given.   Risks, benefits, and alternatives of the medications and treatment plan prescribed today were discussed, and patient expressed understanding.   Education regarding symptom management and diagnosis given to patient on AVS.  Continue to follow with TULLO, Aris Everts, MD for routine health maintenance.   [name] and I agreed with plan.   Mable Paris, FNP

## 2016-10-28 NOTE — Telephone Encounter (Signed)
Pt called wanting to know if she needs to come in today. Pt states she received a message stating she will need to back in a year. Please advise?  Call pt @ [phone]. Thank you!

## 2016-10-28 NOTE — Progress Notes (Signed)
Pre visit review using our clinic review tool, if applicable. No additional management support is needed unless otherwise documented below in the visit note. 

## 2016-10-28 NOTE — Telephone Encounter (Signed)
The Mychart message Colleen Washington has sent out, stated that labs can be rechecked and Follow Up appointment 3-4 months away. Also if she has anyconcerns still with fatigue to FUL with PCP or Colleen Washington. Please advise.

## 2016-10-28 NOTE — Assessment & Plan Note (Addendum)
Suspect lack or exercise and eating habits contributory. Long discussion on dietary changes. Explained to patient that no obvious underlying medical reasons seen in her lab work besides vitamin D deficiency. Pending sleep study. Education and information provided on low glycemic foods given. Advised to follow up with PCP if persists.

## 2016-10-30 DIAGNOSIS — M5432 Sciatica, left side: Secondary | ICD-10-CM | POA: Diagnosis not present

## 2016-10-30 DIAGNOSIS — M9905 Segmental and somatic dysfunction of pelvic region: Secondary | ICD-10-CM | POA: Diagnosis not present

## 2016-10-30 DIAGNOSIS — M955 Acquired deformity of pelvis: Secondary | ICD-10-CM | POA: Diagnosis not present

## 2016-10-30 DIAGNOSIS — M9903 Segmental and somatic dysfunction of lumbar region: Secondary | ICD-10-CM | POA: Diagnosis not present

## 2016-11-26 ENCOUNTER — Encounter: Payer: BLUE CROSS/BLUE SHIELD | Admitting: Obstetrics and Gynecology

## 2016-11-27 DIAGNOSIS — M5432 Sciatica, left side: Secondary | ICD-10-CM | POA: Diagnosis not present

## 2016-11-27 DIAGNOSIS — M9903 Segmental and somatic dysfunction of lumbar region: Secondary | ICD-10-CM | POA: Diagnosis not present

## 2016-11-27 DIAGNOSIS — M955 Acquired deformity of pelvis: Secondary | ICD-10-CM | POA: Diagnosis not present

## 2016-11-27 DIAGNOSIS — M9905 Segmental and somatic dysfunction of pelvic region: Secondary | ICD-10-CM | POA: Diagnosis not present

## 2016-12-18 ENCOUNTER — Ambulatory Visit (INDEPENDENT_AMBULATORY_CARE_PROVIDER_SITE_OTHER): Payer: BLUE CROSS/BLUE SHIELD | Admitting: Obstetrics and Gynecology

## 2016-12-18 ENCOUNTER — Encounter: Payer: Self-pay | Admitting: Obstetrics and Gynecology

## 2016-12-18 VITALS — BP 114/68 | HR 94 | Ht 63.0 in | Wt 194.3 lb

## 2016-12-18 DIAGNOSIS — F419 Anxiety disorder, unspecified: Secondary | ICD-10-CM

## 2016-12-18 DIAGNOSIS — R638 Other symptoms and signs concerning food and fluid intake: Secondary | ICD-10-CM | POA: Diagnosis not present

## 2016-12-18 NOTE — Progress Notes (Signed)
Chief complaint: 1. Anxiety 2. Increased BMI  Patient presents for follow-up. She has weaned herself off of Wellbutrin. Anxiety symptoms are well controlled. She has noted a resolution in her cravings food. Weight remained stable. The patient is noting a slight decrease in energy levels.  Review of lab work from April 2018 is notable for a normal CBC, normal thyroid panel, and normal hemoglobin A1c. Vitamin D level is slightly low.  OBJECTIVE: BP 114/68   Pulse 94   Ht 5\' 3"  (1.6 m)   Wt 194 lb 4.8 oz (88.1 kg)   BMI 34.42 kg/m  Physical exam-deferred  ASSESSMENT: 1. Anxiety symptoms, resolved; now off of Wellbutrin completely 2. BMI, stable  PLAN: 1. Remain off Wellbutrin 2. Monitor anxiety symptoms 3. Continue with healthy eating, exercise and attempted weight loss 4. Return in 6 months for follow-up  A total of 15 minutes were spent face-to-face with the patient during this encounter and over half of that time dealt with counseling and coordination of care.  Brayton Mars, MD   Note: This dictation was prepared with Dragon dictation along with smaller phrase technology. Any transcriptional errors that result from this process are unintentional.

## 2016-12-18 NOTE — Patient Instructions (Signed)
1. Remain off Wellbutrin 2. Monitor symptoms of anxiety 3. Encouraged continued exercise and healthy eating with attempted weight loss 4. Return in 6 months for follow-up

## 2016-12-23 DIAGNOSIS — M5432 Sciatica, left side: Secondary | ICD-10-CM | POA: Diagnosis not present

## 2016-12-23 DIAGNOSIS — M9905 Segmental and somatic dysfunction of pelvic region: Secondary | ICD-10-CM | POA: Diagnosis not present

## 2016-12-23 DIAGNOSIS — M9903 Segmental and somatic dysfunction of lumbar region: Secondary | ICD-10-CM | POA: Diagnosis not present

## 2016-12-23 DIAGNOSIS — M955 Acquired deformity of pelvis: Secondary | ICD-10-CM | POA: Diagnosis not present

## 2016-12-25 ENCOUNTER — Ambulatory Visit: Payer: Self-pay | Admitting: Physician Assistant

## 2016-12-25 ENCOUNTER — Encounter: Payer: Self-pay | Admitting: Internal Medicine

## 2016-12-25 ENCOUNTER — Encounter: Payer: Self-pay | Admitting: Physician Assistant

## 2016-12-25 ENCOUNTER — Other Ambulatory Visit: Payer: Self-pay | Admitting: Internal Medicine

## 2016-12-25 VITALS — BP 120/84 | HR 88 | Temp 98.4°F

## 2016-12-25 DIAGNOSIS — R10A0 Flank pain, unspecified side: Secondary | ICD-10-CM

## 2016-12-25 DIAGNOSIS — M545 Low back pain: Secondary | ICD-10-CM

## 2016-12-25 DIAGNOSIS — R109 Unspecified abdominal pain: Secondary | ICD-10-CM

## 2016-12-25 LAB — POCT URINALYSIS DIPSTICK
Bilirubin, UA: NEGATIVE
Glucose, UA: NEGATIVE
Ketones, UA: NEGATIVE
Leukocytes, UA: NEGATIVE
Nitrite, UA: NEGATIVE
Protein, UA: NEGATIVE
Spec Grav, UA: <=1.005 — AB (ref 1.010–1.025)
Urobilinogen, UA: 0.2 E.U./dL
pH, UA: 6 (ref 5.0–8.0)

## 2016-12-25 MED ORDER — PREDNISONE 10 MG PO TABS: ORAL_TABLET | ORAL | 0 refills | Status: DC

## 2016-12-25 NOTE — Progress Notes (Signed)
pred 

## 2016-12-25 NOTE — Progress Notes (Signed)
S: c/o mid back pain, worried she has a kidney infection, states she had one that didn't show in the regular urine dip before, pain is spasming type pain, feels like its in her kidney area, no uti sx, no fever but has had chills, thought it was a virus, no back injury no numbness or tingling at this time  O: vitals wnl, nad, no cva tenderness, spine is not tender, no muscle spasms noted, n/v intact, ua wnl  A: flank pain  P: urine culture, otc aleve

## 2016-12-26 ENCOUNTER — Emergency Department: Payer: BLUE CROSS/BLUE SHIELD

## 2016-12-26 ENCOUNTER — Emergency Department
Admission: EM | Admit: 2016-12-26 | Discharge: 2016-12-26 | Disposition: A | Discharge status: Home / Self Care | Payer: BLUE CROSS/BLUE SHIELD | Attending: Emergency Medicine | Admitting: Emergency Medicine

## 2016-12-26 ENCOUNTER — Encounter: Payer: Self-pay | Admitting: Emergency Medicine

## 2016-12-26 DIAGNOSIS — F17210 Nicotine dependence, cigarettes, uncomplicated: Secondary | ICD-10-CM | POA: Insufficient documentation

## 2016-12-26 DIAGNOSIS — K219 Gastro-esophageal reflux disease without esophagitis: Secondary | ICD-10-CM

## 2016-12-26 DIAGNOSIS — I10 Essential (primary) hypertension: Secondary | ICD-10-CM | POA: Diagnosis not present

## 2016-12-26 DIAGNOSIS — K8020 Calculus of gallbladder without cholecystitis without obstruction: Secondary | ICD-10-CM | POA: Diagnosis not present

## 2016-12-26 DIAGNOSIS — R1013 Epigastric pain: Secondary | ICD-10-CM | POA: Diagnosis present

## 2016-12-26 LAB — BASIC METABOLIC PANEL
Anion gap: 5 (ref 5–15)
BUN: 11 mg/dL (ref 6–20)
CO2: 28 mmol/L (ref 22–32)
Calcium: 8.9 mg/dL (ref 8.9–10.3)
Chloride: 103 mmol/L (ref 101–111)
Creatinine, Ser: 0.77 mg/dL (ref 0.44–1.00)
GFR calc Af Amer: >60 mL/min (ref >60)
GFR calc non Af Amer: >60 mL/min (ref >60)
Glucose, Bld: 107 mg/dL — ABNORMAL HIGH (ref 65–99)
Potassium: 3.7 mmol/L (ref 3.5–5.1)
Sodium: 136 mmol/L (ref 135–145)

## 2016-12-26 LAB — CBC
HCT: 39.7 % (ref 35.0–47.0)
Hemoglobin: 13.7 g/dL (ref 12.0–16.0)
MCH: 29.7 pg (ref 26.0–34.0)
MCHC: 34.5 g/dL (ref 32.0–36.0)
MCV: 86.1 fL (ref 80.0–100.0)
Platelets: 165 10*3/uL (ref 150–440)
RBC: 4.61 MIL/uL (ref 3.80–5.20)
RDW: 13.5 % (ref 11.5–14.5)
WBC: 6.6 10*3/uL (ref 3.6–11.0)

## 2016-12-26 LAB — TROPONIN I: Troponin I: <0.03 ng/mL (ref <0.03)

## 2016-12-26 LAB — LIPASE, BLOOD: Lipase: 34 U/L (ref 11–51)

## 2016-12-26 MED ORDER — TRAMADOL HCL 50 MG PO TABS: 50.0000 mg | ORAL_TABLET | Freq: Four times a day (QID) | ORAL | 0 refills | Status: DC | PRN

## 2016-12-26 MED ORDER — GI COCKTAIL ~~LOC~~
30.0000 mL | Freq: Once | ORAL | Status: AC
  Administered 2016-12-26: 30 mL via ORAL

## 2016-12-26 MED ORDER — DEXLANSOPRAZOLE 60 MG PO CPDR: 60.0000 mg | DELAYED_RELEASE_CAPSULE | Freq: Every day | ORAL | 1 refills | Status: DC

## 2016-12-26 NOTE — ED Notes (Signed)
Patient transported to X-ray 

## 2016-12-26 NOTE — ED Triage Notes (Signed)
Pt c/o epigastric/lower chest burning on and off for couple days. Has had some upper back pain as well.  No known fevers.  Denies vomiting.

## 2016-12-26 NOTE — ED Provider Notes (Signed)
[facility] Emergency Department Provider Note       Time seen: ----------------------------------------- 7:02 AM on 12/26/2016 -----------------------------------------     I have reviewed the triage vital signs and the nursing notes.   HISTORY   Chief Complaint No chief complaint on file.    HPI Colleen Washington is a 56 y.o. female who presents to the ED for burning in the epigastrium. Patient describes this burning sensation on all for the past several days. She's had some upper back pain as well, no fevers no chills, no chest pain or shortness of breath, no vomiting or diarrhea. Pain is 3 out of 10 in the epigastrium. She does take omeprazole for GERD.   Past Medical History:  Diagnosis Date  . allergic rhinitis   . Anxiety   . AR (allergic rhinitis)   . Depression   . Endometriosis   . GERD (gastroesophageal reflux disease)   . H/O migraine   . Herniated intervertebral disc   . History of diverticulitis of colon   . Hyperlipidemia   . Hypertension   . IBS (irritable bowel syndrome)   . Irritable bowel syndrome     Patient Active Problem List   Diagnosis Date Noted  . Thyroid nodule 10/28/2016  . Cough 12/29/2015  . Ovarian cyst, right 09/30/2015  . Endometriosis 08/10/2015  . Status post vaginal hysterectomy 08/10/2015  . Anxiety 08/10/2015  . Increased BMI 08/10/2015  . Vitamin D deficiency 05/26/2015  . Weight gain 02/22/2015  . Numbness of arm 09/02/2014  . Menopause 09/02/2014  . Back pain 03/12/2014  . Obesity 08/24/2013  . Visit for preventive health examination 08/24/2013  . Snoring 08/24/2013  . Symptoms, such as flushing, sleeplessness, headache, lack of concentration, associated with the menopause 08/24/2013  . Encounter for drug screening 08/13/2013  . Migraine headache 08/26/2012  . 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 Bronchitis, chronic ([place]) 05/09/2012  . Depression   . History  of diverticulitis of colon   . GERD (gastroesophageal reflux disease)   . Hyperlipidemia   . Irritable bowel syndrome   . Herniated intervertebral disc   . Tobacco abuse 03/04/2012    Past Surgical History:  Procedure Laterality Date  . ABDOMINAL HYSTERECTOMY  2003   tvhuso  . BREAST BIOPSY Left 08/2014   benign  . DILATION AND CURETTAGE OF UTERUS      Allergies Compazine [prochlorperazine]  Social History Social History  Substance Use Topics  . Smoking status: Current Some Day Smoker    Packs/day: 0.00    Years: 20.00    Types: Cigarettes  . Smokeless tobacco: Never Used  . Alcohol use 0.0 oz/week     Comment: occas    Review of Systems Constitutional: Negative for fever. Cardiovascular: Negative for chest pain. Respiratory: Negative for shortness of breath. Gastrointestinal: Positive for abdominal burning Genitourinary: Negative for dysuria. Musculoskeletal: Negative for back pain. Skin: Negative for rash. Neurological: Negative for headaches, focal weakness or numbness.  All systems negative/normal/unremarkable except as stated in the HPI  ____________________________________________   PHYSICAL EXAM:  VITAL SIGNS: ED Triage Vitals  Enc Vitals Group     BP      Pulse      Resp      Temp      Temp src      SpO2      Weight      Height      Head Circumference  Peak Flow      Pain Score      Pain Loc      Pain Edu?      Excl. in [place]?     Constitutional: Alert and oriented. Well appearing and in no distress. Eyes: Conjunctivae are normal. Normal extraocular movements. ENT   Head: Normocephalic and atraumatic.   Nose: No congestion/rhinnorhea.   Mouth/Throat: Mucous membranes are moist.   Neck: No stridor. Cardiovascular: Normal rate, regular rhythm. No murmurs, rubs, or gallops. Respiratory: Normal respiratory effort without tachypnea nor retractions. Breath sounds are clear and equal bilaterally. No  wheezes/rales/rhonchi. Gastrointestinal: Soft and nontender. Normal bowel sounds Musculoskeletal: Nontender with normal range of motion in extremities. No lower extremity tenderness nor edema. Neurologic:  Normal speech and language. No gross focal neurologic deficits are appreciated.  Skin:  Skin is warm, dry and intact. No rash noted. Psychiatric: Mood and affect are normal. Speech and behavior are normal.  ____________________________________________  EKG: Interpreted by me. Sinus rhythm rate 90 bpm, normal PR interval, normal QRS, normal QT  ____________________________________________  ED COURSE:  Pertinent labs & imaging results that were available during my care of the patient were reviewed by me and considered in my medical decision making (see chart for details). Patient presents for epigastric pain, we will assess with labs and imaging as indicated.   Procedures ____________________________________________   LABS (pertinent positives/negatives)  Labs Reviewed  BASIC METABOLIC PANEL - Abnormal; Notable for the following:       Result Value   Glucose, Bld 107 (*)    All other components within normal limits  CBC  TROPONIN I  LIPASE, BLOOD    RADIOLOGY  Acute abdominal series IMPRESSION: Increased interstitial markings bilaterally may reflect the patient's smoking history and or acute bronchitic change. There is no alveolar pneumonia nor CHF.  Thoracic aortic atherosclerosis.  Within the abdomen no acute abnormality is observed. It if there are clinical concerns of gallstone, right upper quadrant abdominal ultrasound would be useful. ____________________________________________  FINAL ASSESSMENT AND PLAN  GERD  Plan: Patient's labs and imaging were dictated above. Patient had presented for Upper abdominal burning. This is likely been due to increased NSAID intake and recent prednisone for her back. In the past she had done well and excellent but had done  poorly on Carafate. I will prescribed excellent for her and tramadol for her back pain. I have advised physical therapy for her back.   Earleen Newport, MD   Note: This note was generated in part or whole with voice recognition software. Voice recognition is usually quite accurate but there are transcription errors that can and very often do occur. I apologize for any typographical errors that were not detected and corrected.     Earleen Newport, MD 12/26/16 [phone]

## 2017-01-20 DIAGNOSIS — M5432 Sciatica, left side: Secondary | ICD-10-CM | POA: Diagnosis not present

## 2017-01-20 DIAGNOSIS — M9905 Segmental and somatic dysfunction of pelvic region: Secondary | ICD-10-CM | POA: Diagnosis not present

## 2017-01-20 DIAGNOSIS — M9903 Segmental and somatic dysfunction of lumbar region: Secondary | ICD-10-CM | POA: Diagnosis not present

## 2017-01-20 DIAGNOSIS — M955 Acquired deformity of pelvis: Secondary | ICD-10-CM | POA: Diagnosis not present

## 2017-02-20 DIAGNOSIS — M9903 Segmental and somatic dysfunction of lumbar region: Secondary | ICD-10-CM | POA: Diagnosis not present

## 2017-02-20 DIAGNOSIS — M5432 Sciatica, left side: Secondary | ICD-10-CM | POA: Diagnosis not present

## 2017-02-20 DIAGNOSIS — M9905 Segmental and somatic dysfunction of pelvic region: Secondary | ICD-10-CM | POA: Diagnosis not present

## 2017-02-20 DIAGNOSIS — M955 Acquired deformity of pelvis: Secondary | ICD-10-CM | POA: Diagnosis not present

## 2017-03-04 ENCOUNTER — Ambulatory Visit: Payer: Self-pay | Admitting: Physician Assistant

## 2017-03-04 ENCOUNTER — Encounter: Payer: Self-pay | Admitting: Physician Assistant

## 2017-03-04 VITALS — BP 110/80 | HR 94 | Temp 97.8°F

## 2017-03-04 DIAGNOSIS — S51811A Laceration without foreign body of right forearm, initial encounter: Secondary | ICD-10-CM

## 2017-03-04 NOTE — Progress Notes (Signed)
S: pt states she cut her arm at home last Friday with a box cutter, is left handed and accidentally cut her forearm, area is a little swollen, no drainage from site, no fever/chills  O: vitals wnl, nad, skin with superficial healing laceration of r forearm, no fb noted, slight swelling but no redness or increased warmth, full rom of wrist and fingers, n/v intact  A: superficial laceration, healing  P: clean with soap and water, f/u if any sign of infection

## 2017-03-24 ENCOUNTER — Other Ambulatory Visit: Payer: Self-pay | Admitting: Internal Medicine

## 2017-03-24 DIAGNOSIS — M9903 Segmental and somatic dysfunction of lumbar region: Secondary | ICD-10-CM | POA: Diagnosis not present

## 2017-03-24 DIAGNOSIS — M5033 Other cervical disc degeneration, cervicothoracic region: Secondary | ICD-10-CM | POA: Diagnosis not present

## 2017-03-24 DIAGNOSIS — M9901 Segmental and somatic dysfunction of cervical region: Secondary | ICD-10-CM | POA: Diagnosis not present

## 2017-03-24 DIAGNOSIS — M62830 Muscle spasm of back: Secondary | ICD-10-CM | POA: Diagnosis not present

## 2017-03-27 DIAGNOSIS — M5033 Other cervical disc degeneration, cervicothoracic region: Secondary | ICD-10-CM | POA: Diagnosis not present

## 2017-03-27 DIAGNOSIS — M9901 Segmental and somatic dysfunction of cervical region: Secondary | ICD-10-CM | POA: Diagnosis not present

## 2017-03-27 DIAGNOSIS — M9903 Segmental and somatic dysfunction of lumbar region: Secondary | ICD-10-CM | POA: Diagnosis not present

## 2017-03-27 DIAGNOSIS — M62830 Muscle spasm of back: Secondary | ICD-10-CM | POA: Diagnosis not present

## 2017-04-08 ENCOUNTER — Telehealth: Payer: Self-pay | Admitting: Internal Medicine

## 2017-04-08 NOTE — Telephone Encounter (Signed)
She needs to start  Back on the buspirone twice daily,  I'm sorry but I cannot prescribe a controlled substance to a patient I have not seen  In over 6 months ,  [place]

## 2017-04-08 NOTE — Telephone Encounter (Signed)
Patients daughter is requesting something for anxiety for her mom (who was right next to her). They are currently in [place] and patients brother is in ICU not doing well. Also, patient has not been taking her buspirone as prescribed. She has been taking it PRN instead of TID.

## 2017-04-08 NOTE — Telephone Encounter (Signed)
Pt daughter called for mom who was right next to her. Pt's brother is in ICU in [place] and he is not doing good. Is there something that can be called in for her to help with nerves. Please advise, thank you!  Call [phone]

## 2017-04-08 NOTE — Telephone Encounter (Signed)
Patient is aware and says she understands. She will start taking buspirone BID

## 2017-04-11 ENCOUNTER — Other Ambulatory Visit: Payer: Self-pay | Admitting: Internal Medicine

## 2017-04-11 NOTE — Telephone Encounter (Signed)
Ok to refill wellbutrin  For 30 days ,.  Epic  will not let me  Refill

## 2017-04-11 NOTE — Telephone Encounter (Signed)
Pt. has scheduled an appt to be seen on Oct 8, She requested to have a partial refill until her appt date to help with her anxiety as the family takes her brother off life support today. Pt is aware of Dr. Derrel Nip statement  Pt contact [phone]

## 2017-04-11 NOTE — Telephone Encounter (Signed)
Spoke with pt and she stated that it is the wellbutrin that she would like a partial refill on until she can get in to see you on the 8th of October.

## 2017-04-11 NOTE — Telephone Encounter (Signed)
partial refill of what?

## 2017-04-11 NOTE — Telephone Encounter (Signed)
rx filled

## 2017-04-11 NOTE — Telephone Encounter (Signed)
Please advise 

## 2017-04-14 ENCOUNTER — Other Ambulatory Visit: Payer: Self-pay | Admitting: Internal Medicine

## 2017-04-15 NOTE — Telephone Encounter (Signed)
Patient has been seen in office two times by other providers, last seen by you in March of 2017.  Please advise for refills

## 2017-04-16 MED ORDER — ALBUTEROL SULFATE HFA 108 (90 BASE) MCG/ACT IN AERS: 2.0000 | INHALATION_SPRAY | Freq: Four times a day (QID) | RESPIRATORY_TRACT | 0 refills | Status: DC | PRN

## 2017-04-16 NOTE — Telephone Encounter (Signed)
wellbutrin was refilled on sept 28th see chart

## 2017-04-21 ENCOUNTER — Ambulatory Visit (INDEPENDENT_AMBULATORY_CARE_PROVIDER_SITE_OTHER): Payer: BLUE CROSS/BLUE SHIELD | Admitting: Internal Medicine

## 2017-04-21 ENCOUNTER — Encounter: Payer: Self-pay | Admitting: Internal Medicine

## 2017-04-21 VITALS — BP 110/76 | HR 81 | Temp 98.1°F | Resp 15 | Ht 67.0 in | Wt 198.0 lb

## 2017-04-21 DIAGNOSIS — F419 Anxiety disorder, unspecified: Secondary | ICD-10-CM

## 2017-04-21 DIAGNOSIS — E669 Obesity, unspecified: Secondary | ICD-10-CM | POA: Diagnosis not present

## 2017-04-21 DIAGNOSIS — Z1231 Encounter for screening mammogram for malignant neoplasm of breast: Secondary | ICD-10-CM

## 2017-04-21 DIAGNOSIS — Z1239 Encounter for other screening for malignant neoplasm of breast: Secondary | ICD-10-CM

## 2017-04-21 DIAGNOSIS — E66811 Obesity, class 1: Secondary | ICD-10-CM

## 2017-04-21 DIAGNOSIS — F331 Major depressive disorder, recurrent, moderate: Secondary | ICD-10-CM

## 2017-04-21 MED ORDER — ESOMEPRAZOLE MAGNESIUM 40 MG PO CPDR: 40.0000 mg | DELAYED_RELEASE_CAPSULE | Freq: Every day | ORAL | 3 refills | Status: DC

## 2017-04-21 NOTE — Patient Instructions (Signed)
Good to see you.    No medications were changed today.  Continue the Wellbutrin and Buspirone  .  You can use the buspirone up to 3 times daily if you need to  For anxiety  Regular exercise   helps managed anxeity too!  I encourage  you to start exercising    See you in 6 months .  return for fasting labs piror to visit  Mammogram ordered

## 2017-04-21 NOTE — Progress Notes (Signed)
Subjective:  Patient ID: Colleen Washington, female    DOB: 1961-05-11  Age: 56 y.o. MRN: [id number]  CC: The primary encounter diagnosis was Breast cancer screening. Diagnoses of Moderate episode of recurrent major depressive disorder ([place]), Anxiety, and Obesity (BMI 30.0-34.9) were also pertinent to this visit.  HPI PRIMA RAYNER presents for follow up on depression and anxiety complicated by grief, aggravated by brother's recent respiratory failure and death  From DKA at age 80. [name]    She has increased her use of  buspirone bid,  And resumed wellbutrin as of one month ago for IBS   Symptoms imporoved,  Still I a little irritable  But is still grieving the loss of her younger brother.    Last seen March 2017  Has not had mammogram   Frustrated at her weight gain. Discussed need for exercise. [name]  Has joined [employer].  Not eating right due to recent death  Back feels much better  No pain ,  No barriers to exercise other than lack of motivation  .      Outpatient Medications Prior to Visit  Medication Sig Dispense Refill  . albuterol (VENTOLIN HFA) 108 (90 Base) MCG/ACT inhaler Inhale 2 puffs into the lungs every 6 (six) hours as needed for wheezing or shortness of breath. 18 g 0  . buPROPion (WELLBUTRIN XL) 150 MG 24 hr tablet TAKE ONE (1) TABLET BY MOUTH EVERY DAY **MUST SCHEDULE OFFICE VISIT FOR FURTHERREFILLS** 90 tablet 0  . busPIRone (BUSPAR) 5 MG tablet Take 1 tablet (5 mg total) by mouth 3 (three) times daily. 90 tablet 5  . cetirizine (ZYRTEC) 10 MG tablet Take 10 mg by mouth daily. Reported on 09/12/2015    . fluticasone (FLONASE) 50 MCG/ACT nasal spray USE 2 SPRAYS EACH NOSTRIL DAILY 16 g 3  . hyoscyamine (LEVSIN SL) 0.125 MG SL tablet PLACE 1 TABLET UNDER THE TONGUE EVERY 4 HOURS AS NEEDED 30 tablet 1  . promethazine (PHENERGAN) 12.5 MG tablet Take 1 tablet (12.5 mg total) by mouth every 6 (six) hours as needed for nausea or vomiting. Reported on 10/17/2015 30 tablet 1  .  esomeprazole (NEXIUM) 40 MG capsule Take 1 capsule (40 mg total) by mouth daily before breakfast. 30 capsule 6  . dexlansoprazole (DEXILANT) 60 MG capsule Take 1 capsule (60 mg total) by mouth daily. (Patient not taking: Reported on 04/21/2017) 30 capsule 1  . pantoprazole (PROTONIX) 40 MG tablet Take 1 tablet (40 mg total) by mouth daily. (Patient not taking: Reported on 04/21/2017) 30 tablet 3  . predniSONE (DELTASONE) 10 MG tablet 6 tablets on Day 1 , then reduce by 1 tablet daily until gone (Patient not taking: Reported on 04/21/2017) 21 tablet 0  . traMADol (ULTRAM) 50 MG tablet Take 1 tablet (50 mg total) by mouth every 6 (six) hours as needed. (Patient not taking: Reported on 04/21/2017) 20 tablet 0   No facility-administered medications prior to visit.     Review of Systems;  Patient denies headache, fevers, malaise, unintentional weight loss, skin rash, eye pain, sinus congestion and sinus pain, sore throat, dysphagia,  hemoptysis , cough, dyspnea, wheezing, chest pain, palpitations, orthopnea, edema, abdominal pain, nausea, melena, diarrhea, constipation, flank pain, dysuria, hematuria, urinary  Frequency, nocturia, numbness, tingling, seizures,  Focal weakness, Loss of consciousness,  Tremor, and suicidal ideation.      Objective:  BP 110/76 (BP Location: Left Arm, Patient Position: Sitting, Cuff Size: Normal)   Pulse 81  Temp 98.1 F (36.7 C) (Oral)   Resp 15   Ht 5\' 7"  (1.702 m)   Wt 198 lb (89.8 kg)   SpO2 97%   BMI 31.01 kg/m   BP Readings from Last 3 Encounters:  04/21/17 110/76  03/04/17 110/80  12/26/16 122/83    Wt Readings from Last 3 Encounters:  04/21/17 198 lb (89.8 kg)  12/26/16 198 lb (89.8 kg)  12/18/16 194 lb 4.8 oz (88.1 kg)    General appearance: alert, cooperative and appears stated age Ears: normal TM's and external ear canals both ears Throat: lips, mucosa, and tongue normal; teeth and gums normal Neck: no adenopathy, no carotid bruit, supple,  symmetrical, trachea midline and thyroid not enlarged, symmetric, no tenderness/mass/nodules Back: symmetric, no curvature. ROM normal. No CVA tenderness. Lungs: clear to auscultation bilaterally Heart: regular rate and rhythm, S1, S2 normal, no murmur, click, rub or gallop Abdomen: soft, non-tender; bowel sounds normal; no masses,  no organomegaly Pulses: 2+ and symmetric Skin: Skin color, texture, turgor normal. No rashes or lesions Lymph nodes: Cervical, supraclavicular, and axillary nodes normal.  Lab Results  Component Value Date   HGBA1C 5.2 10/14/2016    Lab Results  Component Value Date   CREATININE 0.77 12/26/2016   CREATININE 0.85 10/14/2016   CREATININE 0.65 09/03/2015    Lab Results  Component Value Date   WBC 6.6 12/26/2016   HGB 13.7 12/26/2016   HCT 39.7 12/26/2016   PLT 165 12/26/2016   GLUCOSE 107 (H) 12/26/2016   CHOL 223 (H) 08/31/2014   TRIG 108.0 08/31/2014   HDL 46.60 08/31/2014   LDLCALC 155 (H) 08/31/2014   ALT 10 10/14/2016   AST 13 10/14/2016   NA 136 12/26/2016   K 3.7 12/26/2016   CL 103 12/26/2016   CREATININE 0.77 12/26/2016   BUN 11 12/26/2016   CO2 28 12/26/2016   TSH 2.06 10/14/2016   HGBA1C 5.2 10/14/2016    Dg Abdomen Acute W/chest  Result Date: 12/26/2016 CLINICAL DATA:  Upper back burning and tight sensation associated with chest pressure for the past 4 days. Some nausea. History of smoking, obesity. EXAM: DG ABDOMEN ACUTE W/ 1V CHEST COMPARISON:  Chest x-ray of September 03, 2015 FINDINGS: The lungs are well-expanded. The interstitial markings are coarse. There is no alveolar infiltrate or pleural effusion. The heart and pulmonary vascularity are normal. The mediastinum is normal in width. There is calcification in the wall of the aortic arch. The bony thorax exhibits no acute abnormality. Within the abdomen the gas pattern is normal. There are no abnormal soft tissue calcifications. The bony structures exhibit no acute abnormalities.  IMPRESSION: Increased interstitial markings bilaterally may reflect the patient's smoking history and or acute bronchitic change. There is no alveolar pneumonia nor CHF. Thoracic aortic atherosclerosis. Within the abdomen no acute abnormality is observed. It if there are clinical concerns of gallstone, right upper quadrant abdominal ultrasound would be useful. Electronically Signed   By: David  Martinique M.D.   On: 12/26/2016 07:43    Assessment & Plan:   Problem List Items Addressed This Visit    Anxiety    Improved with consistent use of buspirone       Depression    Recurrent ,  Aggravated by the recent loss of her brother to DKA.  Continue wellbutrin       Obesity (BMI 30.0-34.9)    I have addressed  BMI and recommended wt loss of 10% of body weight over the next 6 months  using a low glycemic index diet and regular exercise a minimum of 5 days per week.         Other Visit Diagnoses    Breast cancer screening    -  Primary   Relevant Orders   MM SCREENING BREAST TOMO BILATERAL     A total of 25 minutes of face to face time was spent with patient more than half of which was spent in counselling about the above mentioned conditions  and coordination of care  I have discontinued Ms. Wuest's pantoprazole, predniSONE, dexlansoprazole, and traMADol. I am also having her maintain her cetirizine, fluticasone, hyoscyamine, promethazine, busPIRone, buPROPion, albuterol, and esomeprazole.  Meds ordered this encounter  Medications  . esomeprazole (NEXIUM) 40 MG capsule    Sig: Take 1 capsule (40 mg total) by mouth daily before breakfast.    Dispense:  90 capsule    Refill:  3    Medications Discontinued During This Encounter  Medication Reason  . dexlansoprazole (DEXILANT) 60 MG capsule Patient has not taken in last 30 days  . pantoprazole (PROTONIX) 40 MG tablet Patient has not taken in last 30 days  . predniSONE (DELTASONE) 10 MG tablet Patient has not taken in last 30 days  .  traMADol (ULTRAM) 50 MG tablet Patient has not taken in last 30 days  . esomeprazole (NEXIUM) 40 MG capsule Reorder    Follow-up: No Follow-up on file.   Crecencio Mc, MD

## 2017-04-22 ENCOUNTER — Encounter: Payer: Self-pay | Admitting: Internal Medicine

## 2017-04-22 NOTE — Assessment & Plan Note (Signed)
I have addressed  BMI and recommended wt loss of 10% of body weight over the next 6 months using a low glycemic index diet and regular exercise a minimum of 5 days per week.   

## 2017-04-22 NOTE — Assessment & Plan Note (Signed)
Improved with consistent use of buspirone

## 2017-04-22 NOTE — Assessment & Plan Note (Signed)
Recurrent ,  Aggravated by the recent loss of her brother to DKA.  Continue wellbutrin

## 2017-05-16 ENCOUNTER — Encounter: Payer: Self-pay | Admitting: Obstetrics and Gynecology

## 2017-06-10 ENCOUNTER — Other Ambulatory Visit: Payer: Self-pay | Admitting: Internal Medicine

## 2017-06-12 ENCOUNTER — Other Ambulatory Visit: Payer: Self-pay | Admitting: Internal Medicine

## 2017-06-13 MED ORDER — BUPROPION HCL ER (XL) 150 MG PO TB24: 150.0000 mg | ORAL_TABLET | Freq: Every day | ORAL | 0 refills | Status: DC

## 2017-06-13 MED ORDER — ALBUTEROL SULFATE HFA 108 (90 BASE) MCG/ACT IN AERS: 2.0000 | INHALATION_SPRAY | Freq: Four times a day (QID) | RESPIRATORY_TRACT | 0 refills | Status: DC | PRN

## 2017-06-19 ENCOUNTER — Encounter: Payer: BLUE CROSS/BLUE SHIELD | Admitting: Obstetrics and Gynecology

## 2017-07-02 DIAGNOSIS — M9903 Segmental and somatic dysfunction of lumbar region: Secondary | ICD-10-CM | POA: Diagnosis not present

## 2017-07-02 DIAGNOSIS — M9901 Segmental and somatic dysfunction of cervical region: Secondary | ICD-10-CM | POA: Diagnosis not present

## 2017-07-02 DIAGNOSIS — M62830 Muscle spasm of back: Secondary | ICD-10-CM | POA: Diagnosis not present

## 2017-07-02 DIAGNOSIS — M5033 Other cervical disc degeneration, cervicothoracic region: Secondary | ICD-10-CM | POA: Diagnosis not present

## 2017-07-17 ENCOUNTER — Telehealth: Payer: Self-pay | Admitting: Internal Medicine

## 2017-07-17 ENCOUNTER — Ambulatory Visit: Payer: Self-pay | Admitting: *Deleted

## 2017-07-17 MED ORDER — PREDNISONE 10 MG PO TABS: ORAL_TABLET | ORAL | 0 refills | Status: DC

## 2017-07-17 NOTE — Telephone Encounter (Signed)
Prednisone taper Called in to pharmacy

## 2017-07-17 NOTE — Telephone Encounter (Signed)
Patient aware.

## 2017-07-17 NOTE — Telephone Encounter (Signed)
LMTCB

## 2017-07-17 NOTE — Telephone Encounter (Signed)
Pt is calling regarding burning pain with her sciatica on the left leg. She wants to have prednisone called in for the inflammation.  Pt did not want an appointment. Advice given to patient to call back if the pain gets worse.   Reason for Disposition . [1] MODERATE pain (e.g., interferes with normal activities, limping) AND [2] present > 3 days  Answer Assessment - Initial Assessment Questions 1. ONSET: "When did the pain start?"      A few days ago 2. LOCATION: "Where is the pain located?"      Left leg 3. PAIN: "How bad is the pain?"    (Scale 1-10; or mild, moderate, severe)   -  MILD (1-3): doesn't interfere with normal activities    -  MODERATE (4-7): interferes with normal activities (e.g., work or school) or awakens from sleep, limping    -  SEVERE (8-10): excruciating pain, unable to do any normal activities, unable to walk     # 7 4. WORK OR EXERCISE: "Has there been any recent work or exercise that involved this part of the body?"      Wearing a certain type of shoe and big up a large container of water. 5. CAUSE: "What do you think is causing the leg pain?"     inflammation 6. OTHER SYMPTOMS: "Do you have any other symptoms?" (e.g., chest pain, back pain, breathing difficulty, swelling, rash, fever, numbness, weakness)     no 7. PREGNANCY: "Is there any chance you are pregnant?" "When was your last menstrual period?"    LMP hysterectomy  Protocols used: LEG PAIN-A-AH

## 2017-07-17 NOTE — Telephone Encounter (Signed)
Copied from [place] [id number]. Topic: Quick Communication - See Telephone Encounter >> Jul 17, 2017  9:18 AM Ahmed Prima L wrote: CRM for notification. See Telephone encounter for:   07/17/17.  Pt is requesting prednisone because her sciatica has flared up, has inflammation. [facility]

## 2017-07-17 NOTE — Telephone Encounter (Signed)
Please advise 

## 2017-07-23 ENCOUNTER — Encounter: Payer: Self-pay | Admitting: Internal Medicine

## 2017-07-27 ENCOUNTER — Other Ambulatory Visit: Payer: Self-pay | Admitting: Internal Medicine

## 2017-07-27 DIAGNOSIS — R635 Abnormal weight gain: Secondary | ICD-10-CM

## 2017-09-12 DIAGNOSIS — S61227A Laceration with foreign body of left little finger without damage to nail, initial encounter: Secondary | ICD-10-CM | POA: Diagnosis not present

## 2017-09-29 ENCOUNTER — Ambulatory Visit: Payer: Self-pay

## 2017-09-29 NOTE — Telephone Encounter (Signed)
fyi

## 2017-09-29 NOTE — Telephone Encounter (Signed)
Pt calling with non productive cough and wheezing is present per pt. Denies fever, SOB. C/o "raw' throat. Pt states chest feels "tight." Pt states she has tried many OTC meds and inhaler and is not getting better. Pt has had cough over 1 week ago. Pt advised to fo to [facility] to be seen today. Reason for Disposition . Wheezing is present  Answer Assessment - Initial Assessment Questions 1. ONSET: "When did the cough begin?"      A little over a week ago 2. SEVERITY: "How bad is the cough today?"      "pretty bad coughing a lot" 3. RESPIRATORY DISTRESS: "Describe your breathing."      Better today 4. FEVER: "Do you have a fever?" If so, ask: "What is your temperature, how was it measured, and when did it start?"      no 5. HEMOPTYSIS: "Are you coughing up any blood?" If so ask: "How much?" (flecks, streaks, tablespoons, etc.)     no 6. TREATMENT: "What have you done so far to treat the cough?" (e.g., meds, fluids, humidifier)     OTC meds, humidifier, inhaler 7. CARDIAC HISTORY: "Do you have any history of heart disease?" (e.g., heart attack, congestive heart failure)      no 8. LUNG HISTORY: "Do you have any history of lung disease?"  (e.g., pulmonary embolus, asthma, emphysema)     H/o asthma 9. PE RISK FACTORS: "Do you have a history of blood clots?" (or: recent major surgery, recent prolonged travel, bedridden )     no 10. OTHER SYMPTOMS: "Do you have any other symptoms? (e.g., runny nose, wheezing, chest pain)       Wheezing, post nasal drip 11. PREGNANCY: "Is there any chance you are pregnant?" "When was your last menstrual period?"       No- menopause 12. TRAVEL: "Have you traveled out of the country in the last month?" (e.g., travel history, exposures)       no  Protocols used: COUGH - ACUTE NON-PRODUCTIVE-A-AH

## 2017-09-29 NOTE — Telephone Encounter (Signed)
FYI

## 2017-10-02 DIAGNOSIS — J209 Acute bronchitis, unspecified: Secondary | ICD-10-CM | POA: Diagnosis not present

## 2017-10-12 ENCOUNTER — Other Ambulatory Visit: Payer: Self-pay | Admitting: Internal Medicine

## 2017-10-13 ENCOUNTER — Other Ambulatory Visit: Payer: Self-pay | Admitting: Internal Medicine

## 2017-10-13 ENCOUNTER — Telehealth: Payer: Self-pay | Admitting: Internal Medicine

## 2017-10-13 NOTE — Telephone Encounter (Unsigned)
Copied from [place] [id number]. Topic: Quick Communication - Rx Refill/Question >> Oct 13, 2017  2:18 PM Carolyn Stare wrote: Medication  buPROPion (WELLBUTRIN XL) 150 MG 24 hr tablet Pt said she is out of this med   esomeprazole (NEXIUM) 40 MG capsule           Has the patient contacted their pharmacy yes    Preferred Pharmacy [facility]   Agent: Please be advised that RX refills may take up to 3 business days. We ask that you follow-up with your pharmacy.

## 2017-10-13 NOTE — Telephone Encounter (Unsigned)
Copied from [place] [id number]. Topic: Quick Communication - Rx Refill/Question >> Oct 13, 2017  2:14 PM Carolyn Stare wrote: Medication  buPROPion (WELLBUTRIN XL) 150 MG 24 hr tablet    Pt is out of this med and is requesting that the med be called in today      esomeprazole (NEXIUM) 40 MG capsule   Pt did contact [place]             Has the patient contacted their pharmacy? {yes [id number]} (Agent: If no, request that the patient contact the pharmacy for the refill.) Preferred Pharmacy (with phone number or street name): *** Agent: Please be advised that RX refills may take up to 3 business days. We ask that you follow-up with your pharmacy.

## 2017-10-13 NOTE — Telephone Encounter (Unsigned)
Copied from [place] [phone]. Topic: Quick Communication - Rx Refill/Question >> Oct 13, 2017  2:14 PM Carolyn Stare wrote: Medication  buPROPion (WELLBUTRIN XL) 150 MG 24 hr tablet    Pt is out of this med and is requesting that the med be called in today      esomeprazole (NEXIUM) 40 MG capsule   Pt did contact [facility]             Has the patient contacted their pharmacy? {yes [id number]} (Agent: If no, request that the patient contact the pharmacy for the refill.) Preferred Pharmacy (with phone number or street name): *** Agent: Please be advised that RX refills may take up to 3 business days. We ask that you follow-up with your pharmacy.

## 2017-10-14 ENCOUNTER — Other Ambulatory Visit: Payer: Self-pay | Admitting: *Deleted

## 2017-10-14 MED ORDER — ESOMEPRAZOLE MAGNESIUM 40 MG PO CPDR: 40.0000 mg | DELAYED_RELEASE_CAPSULE | Freq: Every day | ORAL | 1 refills | Status: DC

## 2017-10-14 MED ORDER — ALBUTEROL SULFATE HFA 108 (90 BASE) MCG/ACT IN AERS: 2.0000 | INHALATION_SPRAY | Freq: Four times a day (QID) | RESPIRATORY_TRACT | 2 refills | Status: DC | PRN

## 2017-10-14 NOTE — Telephone Encounter (Signed)
Wellbutrin has already been sent- the remainder of the Nexium Rx forwarded to new [facility] due to pharmacy closing.

## 2017-10-20 ENCOUNTER — Ambulatory Visit: Payer: BLUE CROSS/BLUE SHIELD | Admitting: Internal Medicine

## 2017-10-20 ENCOUNTER — Encounter: Payer: Self-pay | Admitting: Internal Medicine

## 2017-10-20 VITALS — BP 122/78 | HR 78 | Temp 97.9°F | Resp 15 | Ht 67.0 in | Wt 201.8 lb

## 2017-10-20 DIAGNOSIS — J209 Acute bronchitis, unspecified: Secondary | ICD-10-CM | POA: Diagnosis not present

## 2017-10-20 DIAGNOSIS — E669 Obesity, unspecified: Secondary | ICD-10-CM

## 2017-10-20 DIAGNOSIS — R635 Abnormal weight gain: Secondary | ICD-10-CM | POA: Diagnosis not present

## 2017-10-20 MED ORDER — PREDNISONE 10 MG PO TABS: ORAL_TABLET | ORAL | 0 refills | Status: DC

## 2017-10-20 MED ORDER — HYDROCOD POLST-CPM POLST ER 10-8 MG/5ML PO SUER: 5.0000 mL | Freq: Every evening | ORAL | 0 refills | Status: DC | PRN

## 2017-10-20 NOTE — Patient Instructions (Signed)
I am prescribing you the Tussionex for nighttime cough and a prednisone taper  To resolve the bronchitis   SUPPRESS THE COUGH,!  THERE IS NOTHING TO COUGH UP   Intermittent fasting from 7 pm to 11 am ,  With 2 meals during that tine  Walking needs to be faster or more uphill to burn calories   Referral to Dr Eddie Dibbles  For help with the weight gain

## 2017-10-20 NOTE — Progress Notes (Signed)
Subjective:  Patient ID: Colleen Washington, female    DOB: 1960/11/25  Age: 57 y.o. MRN: [id number]  CC: The primary encounter diagnosis was Obesity (BMI 30.0-34.9). Diagnoses of Weight gain and Acute bronchitis, unspecified organism were also pertinent to this visit.  HPI Colleen Washington presents for follow up on multiple issues including acute URI.  Chronic GAD/anxiety and obesity .  1) cough x 2 weeks   . Treated at Urgent Care for bronchitis  with albuterol neb , tessalon perles (which she is not tolerating due to sedation ) . "still not bringing anything up" Has tried allegra dm,  advl cold and sinus currenlty just using claritin and albuterol inhaler   Cough is managemable during the day but uncontrolled at night and she is not sleeping .   Obesity:  Frustrated at inability to lose weight .  Diet and exercise reviewed.   Outpatient Medications Prior to Visit  Medication Sig Dispense Refill  . albuterol (VENTOLIN HFA) 108 (90 Base) MCG/ACT inhaler Inhale 2 puffs into the lungs every 6 (six) hours as needed for wheezing or shortness of breath. 18 g 2  . buPROPion (WELLBUTRIN XL) 150 MG 24 hr tablet TAKE ONE (1) TABLET BY MOUTH EVERY DAY 90 tablet 0  . busPIRone (BUSPAR) 5 MG tablet Take 1 tablet (5 mg total) by mouth 3 (three) times daily. 90 tablet 5  . cetirizine (ZYRTEC) 10 MG tablet Take 10 mg by mouth daily. Reported on 09/12/2015    . esomeprazole (NEXIUM) 40 MG capsule Take 1 capsule (40 mg total) by mouth daily before breakfast. 90 capsule 1  . fluticasone (FLONASE) 50 MCG/ACT nasal spray USE 2 SPRAYS EACH NOSTRIL DAILY 16 g 3  . hyoscyamine (LEVSIN SL) 0.125 MG SL tablet PLACE 1 TABLET UNDER THE TONGUE EVERY 4 HOURS AS NEEDED 30 tablet 1  . promethazine (PHENERGAN) 12.5 MG tablet TAKE ONE TABLET BY MOUTH EVERY 6 HOURS AS NEEDED FOR NAUSEA AND VOMTING 30 tablet 1  . predniSONE (DELTASONE) 10 MG tablet 6 tablets on Day 1 , then reduce by 1 tablet daily until gone (Patient not taking:  Reported on 10/20/2017) 21 tablet 0   No facility-administered medications prior to visit.     Review of Systems;  Patient denies headache, fevers, malaise, unintentional weight loss, skin rash, eye pain, sinus congestion and sinus pain, sore throat, dysphagia,  hemoptysis , cough, dyspnea, wheezing, chest pain, palpitations, orthopnea, edema, abdominal pain, nausea, melena, diarrhea, constipation, flank pain, dysuria, hematuria, urinary  Frequency, nocturia, numbness, tingling, seizures,  Focal weakness, Loss of consciousness,  Tremor, insomnia, depression, anxiety, and suicidal ideation.      Objective:  BP 122/78 (BP Location: Left Arm, Patient Position: Sitting, Cuff Size: Normal)   Pulse 78   Temp 97.9 F (36.6 C) (Oral)   Resp 15   Ht 5\' 7"  (1.702 m)   Wt 201 lb 12.8 oz (91.5 kg)   SpO2 98%   BMI 31.61 kg/m   BP Readings from Last 3 Encounters:  10/20/17 122/78  04/21/17 110/76  03/04/17 110/80    Wt Readings from Last 3 Encounters:  10/20/17 201 lb 12.8 oz (91.5 kg)  04/21/17 198 lb (89.8 kg)  12/26/16 198 lb (89.8 kg)    General appearance: alert, cooperative and appears stated age Ears: normal TM's and external ear canals both ears Throat: lips, mucosa, and tongue normal; teeth and gums normal Neck: no adenopathy, no carotid bruit, supple, symmetrical, trachea midline and thyroid not  enlarged, symmetric, no tenderness/mass/nodules Back: symmetric, no curvature. ROM normal. No CVA tenderness. Lungs: clear to auscultation bilaterally Heart: regular rate and rhythm, S1, S2 normal, no murmur, click, rub or gallop Abdomen: soft, non-tender; bowel sounds normal; no masses,  no organomegaly Pulses: 2+ and symmetric Skin: Skin color, texture, turgor normal. No rashes or lesions Lymph nodes: Cervical, supraclavicular, and axillary nodes normal.  Lab Results  Component Value Date   HGBA1C 5.2 10/14/2016    Lab Results  Component Value Date   CREATININE 0.77  12/26/2016   CREATININE 0.85 10/14/2016   CREATININE 0.65 09/03/2015    Lab Results  Component Value Date   WBC 6.6 12/26/2016   HGB 13.7 12/26/2016   HCT 39.7 12/26/2016   PLT 165 12/26/2016   GLUCOSE 107 (H) 12/26/2016   CHOL 223 (H) 08/31/2014   TRIG 108.0 08/31/2014   HDL 46.60 08/31/2014   LDLCALC 155 (H) 08/31/2014   ALT 10 10/14/2016   AST 13 10/14/2016   NA 136 12/26/2016   K 3.7 12/26/2016   CL 103 12/26/2016   CREATININE 0.77 12/26/2016   BUN 11 12/26/2016   CO2 28 12/26/2016   TSH 3.18 10/20/2017   HGBA1C 5.2 10/14/2016    Dg Abdomen Acute W/chest  Result Date: 12/26/2016 CLINICAL DATA:  Upper back burning and tight sensation associated with chest pressure for the past 4 days. Some nausea. History of smoking, obesity. EXAM: DG ABDOMEN ACUTE W/ 1V CHEST COMPARISON:  Chest x-ray of September 03, 2015 FINDINGS: The lungs are well-expanded. The interstitial markings are coarse. There is no alveolar infiltrate or pleural effusion. The heart and pulmonary vascularity are normal. The mediastinum is normal in width. There is calcification in the wall of the aortic arch. The bony thorax exhibits no acute abnormality. Within the abdomen the gas pattern is normal. There are no abnormal soft tissue calcifications. The bony structures exhibit no acute abnormalities. IMPRESSION: Increased interstitial markings bilaterally may reflect the patient's smoking history and or acute bronchitic change. There is no alveolar pneumonia nor CHF. Thoracic aortic atherosclerosis. Within the abdomen no acute abnormality is observed. It if there are clinical concerns of gallstone, right upper quadrant abdominal ultrasound would be useful. Electronically Signed   By: David  Martinique M.D.   On: 12/26/2016 07:43    Assessment & Plan:   Problem List Items Addressed This Visit    Weight gain   Relevant Orders   Ambulatory referral to Endocrinology   Obesity (BMI 30.0-34.9) - Primary   Relevant Orders    Ambulatory referral to Endocrinology   Acute bronchitis    Lung exam is normal,  No signs of sinusitis or otitis either.  Symptoms appear to be secondary to viral infection.  Prednisone taper,  Tussionex  for cough control.          A total of 25 minutes of face to face time was spent with patient more than half of which was spent in counselling about the above mentioned conditions  and coordination of care   I have discontinued Calvina M. Holway's predniSONE. I am also having her start on chlorpheniramine-HYDROcodone and predniSONE. Additionally, I am having her maintain her cetirizine, hyoscyamine, busPIRone, promethazine, fluticasone, buPROPion, albuterol, and esomeprazole.  Meds ordered this encounter  Medications  . chlorpheniramine-HYDROcodone (TUSSIONEX PENNKINETIC ER) 10-8 MG/5ML SUER    Sig: Take 5 mLs by mouth at bedtime as needed for cough.    Dispense:  140 mL    Refill:  0  .  predniSONE (DELTASONE) 10 MG tablet    Sig: 6 tablets on Day 1 , then reduce by 1 tablet daily until gone    Dispense:  21 tablet    Refill:  0    Medications Discontinued During This Encounter  Medication Reason  . predniSONE (DELTASONE) 10 MG tablet Completed Course    Follow-up: No follow-ups on file.   Crecencio Mc, MD

## 2017-10-21 DIAGNOSIS — J209 Acute bronchitis, unspecified: Secondary | ICD-10-CM | POA: Insufficient documentation

## 2017-10-21 LAB — T4, FREE: Free T4: 0.72 ng/dL (ref 0.60–1.60)

## 2017-10-21 LAB — TSH: TSH: 3.18 u[IU]/mL (ref 0.35–4.50)

## 2017-10-21 NOTE — Assessment & Plan Note (Signed)
Lung exam is normal,  No signs of sinusitis or otitis either.  Symptoms appear to be secondary to viral infection.  Prednisone taper,  Tussionex  for cough control.

## 2017-10-29 ENCOUNTER — Ambulatory Visit: Payer: Self-pay | Admitting: *Deleted

## 2017-10-29 NOTE — Telephone Encounter (Signed)
Patient was seen on 10/20/17, patient is still not feeling well, has no energy and coughing very bad. Pt has finished course of  Prednisone.   Left message for pt to return call to the office to discuss symptoms.

## 2017-10-29 NOTE — Telephone Encounter (Signed)
Attempted to call pt x2, but no answer at this time.

## 2017-10-30 MED ORDER — AZITHROMYCIN 250 MG PO TABS: ORAL_TABLET | ORAL | 0 refills | Status: DC

## 2017-10-30 MED ORDER — PREDNISONE 10 MG PO TABS: ORAL_TABLET | ORAL | 0 refills | Status: DC

## 2017-10-30 MED ORDER — FLUCONAZOLE 150 MG PO TABS: 150.0000 mg | ORAL_TABLET | Freq: Every day | ORAL | 0 refills | Status: DC

## 2017-10-30 NOTE — Telephone Encounter (Signed)
Please advise 

## 2017-10-30 NOTE — Telephone Encounter (Signed)
Patient states that she is ok during the day. She is taking Tussionex and it helps but she is still coughing and is really tired and sleep. She is wondering if its a sinus infection. She would like to know if she can get an antibiotic called in as well something for a yeast infection in case the antibiotic is called in.

## 2017-10-30 NOTE — Telephone Encounter (Signed)
Spoke with pt and informed her of what Dr. Derrel Nip has called in. Pt gave a verbal understanding.

## 2017-10-30 NOTE — Telephone Encounter (Signed)
iF HER SINUSES ARE NOT PAINFUL AND DRAINIG GREEN /VLOOD STREAKED DRAINAGE IT IS NOT LIKELY A SINUS INFECTION.  But I will send Azithromycin and repeat the prednisone taper along with fluconazole sent to pharmacy

## 2017-11-04 ENCOUNTER — Encounter: Payer: Self-pay | Admitting: Internal Medicine

## 2017-11-18 ENCOUNTER — Other Ambulatory Visit: Payer: Self-pay | Admitting: Internal Medicine

## 2017-12-09 ENCOUNTER — Ambulatory Visit
Admission: RE | Admit: 2017-12-09 | Discharge: 2017-12-09 | Disposition: A | Discharge status: Home / Self Care | Payer: BLUE CROSS/BLUE SHIELD | Source: Ambulatory Visit | Attending: Internal Medicine | Admitting: Internal Medicine

## 2017-12-09 DIAGNOSIS — Z1231 Encounter for screening mammogram for malignant neoplasm of breast: Secondary | ICD-10-CM | POA: Diagnosis not present

## 2017-12-09 DIAGNOSIS — Z1239 Encounter for other screening for malignant neoplasm of breast: Secondary | ICD-10-CM

## 2018-01-22 DIAGNOSIS — E669 Obesity, unspecified: Secondary | ICD-10-CM | POA: Diagnosis not present

## 2018-01-22 DIAGNOSIS — R635 Abnormal weight gain: Secondary | ICD-10-CM | POA: Diagnosis not present

## 2018-01-26 DIAGNOSIS — E669 Obesity, unspecified: Secondary | ICD-10-CM | POA: Diagnosis not present

## 2018-04-24 ENCOUNTER — Other Ambulatory Visit: Payer: Self-pay | Admitting: Internal Medicine

## 2018-05-07 ENCOUNTER — Other Ambulatory Visit: Payer: Self-pay | Admitting: Internal Medicine

## 2018-05-08 DIAGNOSIS — E669 Obesity, unspecified: Secondary | ICD-10-CM | POA: Diagnosis not present

## 2018-05-13 NOTE — Telephone Encounter (Signed)
Okay to refill? 

## 2018-05-31 ENCOUNTER — Other Ambulatory Visit: Payer: Self-pay | Admitting: Internal Medicine

## 2018-06-09 DIAGNOSIS — H66002 Acute suppurative otitis media without spontaneous rupture of ear drum, left ear: Secondary | ICD-10-CM | POA: Diagnosis not present

## 2018-06-09 DIAGNOSIS — J019 Acute sinusitis, unspecified: Secondary | ICD-10-CM | POA: Diagnosis not present

## 2018-06-09 DIAGNOSIS — B9689 Other specified bacterial agents as the cause of diseases classified elsewhere: Secondary | ICD-10-CM | POA: Diagnosis not present

## 2018-06-09 DIAGNOSIS — B373 Candidiasis of vulva and vagina: Secondary | ICD-10-CM | POA: Diagnosis not present

## 2018-07-20 ENCOUNTER — Ambulatory Visit: Payer: Self-pay

## 2018-07-20 NOTE — Telephone Encounter (Signed)
See My Chart message. Pt. Scheduled an appointment for the end of the month. Attempted x 3 to contact - pt. Mailbox was full. Unable to leave a message. Please advise.

## 2018-07-20 NOTE — Telephone Encounter (Signed)
Spoke with pt about her appointment that she scheduled for 08/12/2018 and the fact that is says she is having chest tightness. The pt stated that the chest tightness is an on going thing that occurs every so often, and she stated that they do not last long at all. She feels like they are due to her anxiety. The pt also stated that she has been to the ER several times about the chest tightness and she "will not go back".

## 2018-08-02 ENCOUNTER — Other Ambulatory Visit: Payer: Self-pay | Admitting: Internal Medicine

## 2018-08-12 ENCOUNTER — Ambulatory Visit: Payer: BLUE CROSS/BLUE SHIELD | Admitting: Internal Medicine

## 2018-08-12 ENCOUNTER — Encounter: Payer: Self-pay | Admitting: Internal Medicine

## 2018-08-12 VITALS — BP 112/82 | HR 91 | Temp 97.7°F | Resp 15 | Ht 67.0 in | Wt 197.4 lb

## 2018-08-12 DIAGNOSIS — R0789 Other chest pain: Secondary | ICD-10-CM

## 2018-08-12 DIAGNOSIS — I471 Supraventricular tachycardia: Secondary | ICD-10-CM

## 2018-08-12 MED ORDER — BUSPIRONE HCL 15 MG PO TABS: 15.0000 mg | ORAL_TABLET | Freq: Two times a day (BID) | ORAL | 3 refills | Status: DC | PRN

## 2018-08-12 MED ORDER — PROPRANOLOL HCL 10 MG PO TABS: 10.0000 mg | ORAL_TABLET | Freq: Three times a day (TID) | ORAL | 0 refills | Status: DC

## 2018-08-12 NOTE — Progress Notes (Signed)
Subjective:  Patient ID: Colleen Washington, female    DOB: June 12, 1961  Age: 58 y.o. MRN: [id number]  CC: The primary encounter diagnosis was Chest tightness. Diagnoses of Supraventricular tachycardia ([place]) and SVT (supraventricular tachycardia) ([place]) were also pertinent to this visit.  HPI Colleen Washington presents for comprehensive exam but has several acute medical complaints which were addressed instead  PAP up to date,   mammogram done  By GYN   1) recurrent episodes of chest tightness , occurring every few days  Feels like her heart races, pulse has been 112 during episodes,  Episodes occur without physical provocation.  Has been using phentermine intermittently (prescribed  By DR. Solum) ,  But recalls that the episodes began prior to initiation of  phentermine .  The episodes last up to 45 minutes.  She treats them with benadryl which relaxes her .  Has a lot of anxiety but denies panic attacks.  Mother has atrial fib.  Patient still smoking ,  Feels that the the medication restricted her from expressing herself freely. Using buspirone very rarely  Diet reviewed:  Drinks caffeinated coffee daily      Has IBS alternates between diarrhea and less often constipation  Outpatient Medications Prior to Visit  Medication Sig Dispense Refill  . albuterol (PROVENTIL HFA;VENTOLIN HFA) 108 (90 Base) MCG/ACT inhaler TAKE 2 PUFFS BY MOUTH EVERY 6 HOURS AS NEEDED FOR WHEEZE OR SHORTNESS OF BREATH 8.5 Inhaler 2  . cetirizine (ZYRTEC) 10 MG tablet Take 10 mg by mouth daily. Reported on 09/12/2015    . esomeprazole (NEXIUM) 40 MG capsule TAKE 1 CAPSULE BY MOUTH EVERY DAY BEFORE BREAKFAST 30 capsule 5  . fluticasone (FLONASE) 50 MCG/ACT nasal spray USE 2 SPRAYS EACH NOSTRIL DAILY 16 g 3  . hyoscyamine (LEVSIN SL) 0.125 MG SL tablet PLACE 1 TABLET UNDER THE TONGUE EVERY 4 HOURS AS NEEDED 30 tablet 1  . promethazine (PHENERGAN) 12.5 MG tablet TAKE ONE TABLET BY MOUTH EVERY 6 HOURS AS NEEDED FOR NAUSEA AND VOMITING.  30 tablet 0  . busPIRone (BUSPAR) 5 MG tablet Take 1 tablet (5 mg total) by mouth 3 (three) times daily. 90 tablet 5  . azithromycin (ZITHROMAX) 250 MG tablet 2 tablets one day 1,  One tablet daily until gone (Patient not taking: Reported on 08/12/2018) 6 tablet 0  . buPROPion (WELLBUTRIN XL) 150 MG 24 hr tablet TAKE ONE (1) TABLET BY MOUTH EVERY DAY (Patient not taking: Reported on 08/12/2018) 30 tablet 5  . chlorpheniramine-HYDROcodone (TUSSIONEX PENNKINETIC ER) 10-8 MG/5ML SUER Take 5 mLs by mouth at bedtime as needed for cough. (Patient not taking: Reported on 08/12/2018) 140 mL 0  . fluconazole (DIFLUCAN) 150 MG tablet Take 1 tablet (150 mg total) by mouth daily. (Patient not taking: Reported on 08/12/2018) 2 tablet 0  . predniSONE (DELTASONE) 10 MG tablet 6 tablets on Day 1 , then reduce by 1 tablet daily until gone (Patient not taking: Reported on 08/12/2018) 21 tablet 0  . predniSONE (DELTASONE) 10 MG tablet 6 tablets on Day 1 , then reduce by 1 tablet daily until gone (Patient not taking: Reported on 08/12/2018) 21 tablet 0   No facility-administered medications prior to visit.     Review of Systems;  Patient denies headache, fevers, malaise, unintentional weight loss, skin rash, eye pain, sinus congestion and sinus pain, sore throat, dysphagia,  hemoptysis , cough, dyspnea, wheezing, chest pain, palpitations, orthopnea, edema, abdominal pain, nausea, melena, diarrhea, constipation, flank pain, dysuria, hematuria, urinary  Frequency, nocturia, numbness, tingling, seizures,  Focal weakness, Loss of consciousness,  Tremor, insomnia, depression, anxiety, and suicidal ideation.      Objective:  BP 112/82 (BP Location: Left Arm, Patient Position: Sitting, Cuff Size: Normal)   Pulse 91   Temp 97.7 F (36.5 C) (Oral)   Resp 15   Ht 5\' 7"  (1.702 m)   Wt 197 lb 6.4 oz (89.5 kg)   SpO2 96%   BMI 30.92 kg/m   BP Readings from Last 3 Encounters:  08/12/18 112/82  10/20/17 122/78  04/21/17  110/76    Wt Readings from Last 3 Encounters:  08/12/18 197 lb 6.4 oz (89.5 kg)  10/20/17 201 lb 12.8 oz (91.5 kg)  04/21/17 198 lb (89.8 kg)    General appearance: alert, cooperative and appears stated age Ears: normal TM's and external ear canals both ears Throat: lips, mucosa, and tongue normal; teeth and gums normal Neck: no adenopathy, no carotid bruit, supple, symmetrical, trachea midline and thyroid not enlarged, symmetric, no tenderness/mass/nodules Back: symmetric, no curvature. ROM normal. No CVA tenderness. Lungs: clear to auscultation bilaterally Heart: regular rate and rhythm, S1, S2 normal, no murmur, click, rub or gallop Abdomen: soft, non-tender; bowel sounds normal; no masses,  no organomegaly Pulses: 2+ and symmetric Skin: Skin color, texture, turgor normal. No rashes or lesions Lymph nodes: Cervical, supraclavicular, and axillary nodes normal.  Lab Results  Component Value Date   HGBA1C 5.2 10/14/2016    Lab Results  Component Value Date   CREATININE 0.87 08/12/2018   CREATININE 0.77 12/26/2016   CREATININE 0.85 10/14/2016    Lab Results  Component Value Date   WBC 6.6 12/26/2016   HGB 13.7 12/26/2016   HCT 39.7 12/26/2016   PLT 165 12/26/2016   GLUCOSE 80 08/12/2018   CHOL 223 (H) 08/31/2014   TRIG 108.0 08/31/2014   HDL 46.60 08/31/2014   LDLCALC 155 (H) 08/31/2014   ALT 19 08/12/2018   AST 17 08/12/2018   NA 136 08/12/2018   K 4.2 08/12/2018   CL 104 08/12/2018   CREATININE 0.87 08/12/2018   BUN 23 08/12/2018   CO2 24 08/12/2018   TSH 1.98 08/12/2018   HGBA1C 5.2 10/14/2016    Mm Screening Breast Tomo Bilateral  Result Date: 12/10/2017 CLINICAL DATA:  Screening. EXAM: DIGITAL SCREENING BILATERAL MAMMOGRAM WITH TOMO AND CAD COMPARISON:  Previous exam(s). ACR Breast Density Category b: There are scattered areas of fibroglandular density. FINDINGS: There are no findings suspicious for malignancy. Images were processed with CAD. IMPRESSION:  No mammographic evidence of malignancy. A result letter of this screening mammogram will be mailed directly to the patient. RECOMMENDATION: Screening mammogram in one year. (Code:SM-B-01Y) BI-RADS CATEGORY  1: Negative. Electronically Signed   By: Lillia Mountain M.D.   On: 12/10/2017 08:08    Assessment & Plan:   Problem List Items Addressed This Visit    SVT (supraventricular tachycardia) ([place])    I have reviewed an EKG done today;  She is in sinus rhythm.   I suspect she is having runs of SVT .  Thyroid and lytes screen normal .  Rx for  propranolol given to use prn episodes lasting > 5 minutes.   Advised to stop phentermine and reduce caffeine use.  encouraged to  Exercise more regularly and keep a log of the frequency of the events.    Lab Results  Component Value Date   TSH 1.98 08/12/2018   Lab Results  Component Value Date   NA  136 08/12/2018   K 4.2 08/12/2018   CL 104 08/12/2018   CO2 24 08/12/2018         Relevant Medications   propranolol (INDERAL) 10 MG tablet    Other Visit Diagnoses    Chest tightness    -  Primary   Relevant Orders   EKG 12-Lead (Completed)   Supraventricular tachycardia ([facility])       Relevant Medications   propranolol (INDERAL) 10 MG tablet   Other Relevant Orders   TSH (Completed)   Comprehensive metabolic panel (Completed)   Magnesium (Completed)   CBC w/Diff      I have discontinued Colleen Washington "Maria"'s chlorpheniramine-HYDROcodone, predniSONE, azithromycin, predniSONE, fluconazole, and buPROPion. I have also changed her busPIRone. Additionally, I am having her start on propranolol. Lastly, I am having her maintain her cetirizine, hyoscyamine, fluticasone, esomeprazole, promethazine, and albuterol.  Meds ordered this encounter  Medications  . busPIRone (BUSPAR) 15 MG tablet    Sig: Take 1 tablet (15 mg total) by mouth 2 (two) times daily as needed.    Dispense:  60 tablet    Refill:  3  . propranolol (INDERAL) 10 MG tablet    Sig:  Take 1 tablet (10 mg total) by mouth 3 (three) times daily. As needed for rapid heart rate    Dispense:  30 tablet    Refill:  0    Medications Discontinued During This Encounter  Medication Reason  . azithromycin (ZITHROMAX) 250 MG tablet Completed Course  . chlorpheniramine-HYDROcodone (TUSSIONEX PENNKINETIC ER) 10-8 MG/5ML SUER Completed Course  . fluconazole (DIFLUCAN) 150 MG tablet Completed Course  . predniSONE (DELTASONE) 10 MG tablet Completed Course  . predniSONE (DELTASONE) 10 MG tablet Completed Course  . buPROPion (WELLBUTRIN XL) 150 MG 24 hr tablet   . busPIRone (BUSPAR) 5 MG tablet     Follow-up: No follow-ups on file.   Crecencio Mc, MD

## 2018-08-12 NOTE — Patient Instructions (Signed)
Stop the phentermine,  Since it is a stimulant and can aggravate SVT  Reduce your caffeine intake  Exercise more regularly   Use the propranolol for your next episode of rapid heart rate.  You can also take the 15 mg dose of buspirone  Keep a log of the occurrences so we can decide if you need to see a cardiologist    Supraventricular Tachycardia, Adult Supraventricular tachycardia (SVT) is a kind of abnormal heartbeat. It makes your heart beat very fast and then beat at a normal speed. A normal resting heartbeat is 60-100 times a minute. This condition can make your heart beat more than 150 times a minute. Times of having a fast heartbeat (episodes) can be scary, but they are usually not dangerous. However, in some cases, they can lead to heart failure if:  They happen several times per day.  Last for longer than a few seconds. What are the causes? Usually, a normal heartbeat starts when an area called the sinoatrial node releases an electrical signal. In SVT, other areas of the heart send out electrical signals that interfere with the signal from the sinoatrial node. What increases the risk?  Being 48?57 years old.  Being a woman. The following factors may make you more likely to develop this condition:  Stress.  Tiredness.  Smoking.  Stimulant drugs, such as cocaine and methamphetamine  Alcohol.  Caffeine.  Pregnancy.  Anxiety. What are the signs or symptoms?  A pounding heart. ? A feeling that your heart is skipping beats (palpitations). ? Weakness. ? Trouble getting enough air. ? Pain or tightness in your chest. ? Feeling like you are going to pass out. ? Feeling worried or nervous. ? Dizziness. ? Sweating. ? Feeling like you might throw up. ? Passing out (fainting). ? Tiredness. Sometimes, there are no symptoms. How is this treated?  Vagal nerve stimulation. Ways to do this: ? Holding your breath and pushing, as though you are pooping. ? Massaging an  area on one side of your neck. Do not try this yourself. Only a doctor should do this. If done the wrong way, it can lead to a stroke. ? Bending forward with your head between your legs. ? Coughing while bending forward with your head between your legs. ? Closing your eyes and massaging your eyeballs. Ask a doctor how to do this.  Medicines that prevent attacks.  Medicine to stop an attack given through an IV at the hospital.  A small electric shock (cardioversion) that stops an attack.  Radiofrequency ablation. In this procedure, a small, thin tube (catheter) is used to send radiofrequency energy to the area that is causing the rapid heartbeats. Follow these instructions at home: Stress   Avoid things that make you feel stressed.  To deal with stress, try: ? Doing yoga, meditation, or being out in nature. ? Listening to relaxing music. ? Doing deep breathing. ? Taking steps to be healthy, such as getting lots of sleep, exercising, and eating a balanced diet. ? Talking with a mental health doctor. Sleep  Try to get at least 7 hours of sleep each night. Tobacco and nicotine   Do not use any products that contain nicotine or tobacco, such as cigarettes, e-cigarettes, and chewing tobacco. If you need help quitting, ask your doctor. Alcohol  If alcohol gives you a fast heartbeat, do not drink alcohol.  Even in alcohol does not seem to give you a fast heartbeat, limit alcohol use,. ? For nonpregnant women, this  means no more than 1 drink a day. ? For men, this means no more than 2 drinks a day.  "One drink" means one of these:  12 oz of beer (355 mL).  5 oz of wine (148 mL)  1 oz of hard liquor (44 mL). Caffeine  If caffeine gives you a fast heartbeat, do not eat, drink, or use anything with caffeine in it.  Even if caffeine does not seem to give you a fast heartbeat, limit how much caffeine you eat, drink, or use. Stimulant drugs  Do not use drugs such as cocaine or  methamphetamine. If you need help quitting, ask your doctor. General instructions  Stay at a healthy weight.  Exercise regularly. Ask your doctor about good activities for you. Try one of these: ? 150 minutes a week of gentle exercise, like walking or yoga. ? 75 minutes a week of exercise that is very active, like running or swimming.  Try a combination of gentle exercise and very active exercise.  Do home treatments to slow down your heartbeat.  Take over-the-counter and prescription medicines only as told by your doctor. Contact a doctor if:  You have a fast heartbeat more often.  Times of having a fast heartbeat last longer than before.  Home treatments to slow down your heartbeat do not help.  You have new symptoms. Get help right away if:  You have chest pain.  Your symptoms get worse.  You have trouble breathing.  Your heart beats very fast for more than 20 minutes.  You pass out. These symptoms may be an emergency. Do not wait to see if the symptoms will go away. Get medical help right away. Call your local emergency services (911 in the [place] Do not drive yourself to the hospital. Summary  SVT is a type of abnormal heart beat.  During an episode, our heart rate may be higher than 150 beats per minute  Treatment depends on how often the condition happens and your symptoms. This information is not intended to replace advice given to you by your health care provider. Make sure you discuss any questions you have with your health care provider. Document Released: 07/01/2005 Document Revised: 03/07/2016 Document Reviewed: 03/07/2016 Elsevier Interactive Patient Education  2019 [employer].

## 2018-08-13 LAB — COMPREHENSIVE METABOLIC PANEL
ALT: 19 U/L (ref 0–35)
AST: 17 U/L (ref 0–37)
Albumin: 4.3 g/dL (ref 3.5–5.2)
Alkaline Phosphatase: 68 U/L (ref 39–117)
BUN: 23 mg/dL (ref 6–23)
CO2: 24 mEq/L (ref 19–32)
Calcium: 9.6 mg/dL (ref 8.4–10.5)
Chloride: 104 mEq/L (ref 96–112)
Creatinine, Ser: 0.87 mg/dL (ref 0.40–1.20)
GFR: 67.27 mL/min (ref >60.00)
Glucose, Bld: 80 mg/dL (ref 70–99)
Potassium: 4.2 mEq/L (ref 3.5–5.1)
Sodium: 136 mEq/L (ref 135–145)
Total Bilirubin: 0.4 mg/dL (ref 0.2–1.2)
Total Protein: 7.4 g/dL (ref 6.0–8.3)

## 2018-08-13 LAB — TSH: TSH: 1.98 u[IU]/mL (ref 0.35–4.50)

## 2018-08-13 LAB — MAGNESIUM: Magnesium: 2.1 mg/dL (ref 1.5–2.5)

## 2018-08-15 DIAGNOSIS — I471 Supraventricular tachycardia, unspecified: Secondary | ICD-10-CM | POA: Insufficient documentation

## 2018-08-15 HISTORY — DX: Supraventricular tachycardia, unspecified: I47.10

## 2018-08-15 NOTE — Assessment & Plan Note (Addendum)
I have reviewed an EKG done today;  She is in sinus rhythm.   I suspect she is having runs of SVT .  Thyroid and lytes screen normal .  Rx for  propranolol given to use prn episodes lasting > 5 minutes.   Advised to stop phentermine and reduce caffeine use.  encouraged to  Exercise more regularly and keep a log of the frequency of the events.    Lab Results  Component Value Date   TSH 1.98 08/12/2018   Lab Results  Component Value Date   NA 136 08/12/2018   K 4.2 08/12/2018   CL 104 08/12/2018   CO2 24 08/12/2018

## 2018-08-16 ENCOUNTER — Other Ambulatory Visit: Payer: Self-pay

## 2018-08-16 ENCOUNTER — Other Ambulatory Visit: Payer: Self-pay | Admitting: Internal Medicine

## 2018-08-19 DIAGNOSIS — E669 Obesity, unspecified: Secondary | ICD-10-CM | POA: Diagnosis not present

## 2018-08-19 DIAGNOSIS — R635 Abnormal weight gain: Secondary | ICD-10-CM | POA: Diagnosis not present

## 2018-08-26 ENCOUNTER — Other Ambulatory Visit: Payer: Self-pay | Admitting: Internal Medicine

## 2018-08-29 ENCOUNTER — Other Ambulatory Visit: Payer: Self-pay | Admitting: Internal Medicine

## 2018-10-24 ENCOUNTER — Other Ambulatory Visit: Payer: Self-pay | Admitting: Internal Medicine

## 2018-11-04 ENCOUNTER — Telehealth: Payer: Self-pay | Admitting: Internal Medicine

## 2018-11-04 MED ORDER — PREDNISONE 10 MG PO TABS: ORAL_TABLET | ORAL | 0 refills | Status: DC

## 2018-11-04 NOTE — Telephone Encounter (Signed)
Copied from [place] [id number]. Topic: Quick Communication - See Telephone Encounter >> Nov 04, 2018  3:18 PM Robina Ade, Helene Kelp D wrote: CRM for notification. See Telephone encounter for: 11/04/18. Patient called and is requesting for prednisone for her sinus infection. She wants to know if Dr. Derrel Nip will send it in to there pharmacy.

## 2018-11-04 NOTE — Telephone Encounter (Signed)
Since SHE has had a PT evaluation,  I will call in a prednisone to [facility].  Start with 60 mg daily x 3,  Then taper, and have her schedule a virtual visit next Tuesday,  I have opened my schedule on Tuesday

## 2018-11-04 NOTE — Telephone Encounter (Signed)
(  Note:  Sinus infection from Patient Colleen Washington was incorrect)  Called and spoke to patient.  Pt c/o sciatic nerve pain.  Pt rates pain a 8/10.  Pt said that she went to [facility] and saw PT today.  Patient said that PT was going to send over notes to Dr. Derrel Nip.  Pt wants to know if prednisone could be called into pharmacy.    Pt said that she is using a heating pad, ice, Aleeve for pain.  Please advise pt on whether or not she needs to schedule a virtual appt or if meds can be called in based on PT notes from [facility].

## 2018-11-05 ENCOUNTER — Other Ambulatory Visit: Payer: Self-pay | Admitting: Internal Medicine

## 2018-11-05 DIAGNOSIS — M5440 Lumbago with sciatica, unspecified side: Secondary | ICD-10-CM

## 2018-11-05 MED ORDER — LIDOCAINE 5 % EX PTCH: 1.0000 | MEDICATED_PATCH | CUTANEOUS | 0 refills | Status: DC

## 2018-11-05 MED ORDER — PREDNISONE 10 MG PO TABS: ORAL_TABLET | ORAL | 0 refills | Status: DC

## 2018-11-05 NOTE — Telephone Encounter (Signed)
Lidocaine patch sent .  Leave on for 12 hours,  Then take off or 12 hours

## 2018-11-05 NOTE — Telephone Encounter (Signed)
Spoke with pt and informed that the prednisone was sent in and scheduled pt for a follow up virtual visit on Tuesday. The pt was wondering if there is some kind of prescription topical pain reliever. Pt stated that she has tried all the OTC ones and they do work but not when the pain gets really bad.

## 2018-11-05 NOTE — Telephone Encounter (Signed)
LMTCB. PEC may speak with pt.  

## 2018-11-06 NOTE — Telephone Encounter (Signed)
Called pt and informed her the lidocaine patch was sent to pharmacy and that she is to wear it for 12 hours only then remove for 12 hours, pt understood.  Jakerra Floyd,cma

## 2018-11-07 ENCOUNTER — Other Ambulatory Visit: Payer: Self-pay | Admitting: Internal Medicine

## 2018-11-10 ENCOUNTER — Other Ambulatory Visit: Payer: Self-pay

## 2018-11-10 ENCOUNTER — Ambulatory Visit (INDEPENDENT_AMBULATORY_CARE_PROVIDER_SITE_OTHER): Payer: BLUE CROSS/BLUE SHIELD | Admitting: Internal Medicine

## 2018-11-10 DIAGNOSIS — M545 Low back pain, unspecified: Secondary | ICD-10-CM

## 2018-11-10 NOTE — Progress Notes (Signed)
Virtual Visit via [email or web address]  This visit type was conducted due to national recommendations for restrictions regarding the COVID-19 pandemic (e.g. social distancing).  This format is felt to be most appropriate for this patient at this time.  All issues noted in this document were discussed and addressed.  No physical exam was performed (except for noted visual exam findings with Video Visits).   I connected with@ on 11/10/18 at  8:30 AM EDT by a video enabled telemedicine application or telephone and verified that I am speaking with the correct person using two identifiers. Location patient: work  Location provider: work  Persons participating in the virtual visit: patient, provider  I discussed the limitations, risks, security and privacy concerns of performing an evaluation and management service by telephone and the availability of in person appointments. I also discussed with the patient that there may be a patient responsible charge related to this service. The patient expressed understanding and agreed to proceed.  Reason for visit: left sided back pain for the past week  Had a complimentary PT session but was told that her insurance would not cover enough of the service and her out of pocket expense would be  $300/visit    HPI:  1) One week history of low back pain ,  Started after helping her 100 lb dog  Get up on the bed.  The pain does not radiate beyond her buttocks  And has started to  [place] 60 MG DAILY  AND USE OF LIDOCAINE PATCHES .   2) obesity:  He is taking phentermine prescribed by [facility].  Still eating poorly and not losing weight because she is using the cafeteria as her main source of meals . Not going to grocery store    ROS: See pertinent positives and negatives per HPI.  Past Medical History:  Diagnosis Date  . allergic rhinitis   . Anxiety   . AR (allergic rhinitis)   . Depression   . Endometriosis   . GERD (gastroesophageal reflux  disease)   . H/O migraine   . Herniated intervertebral disc   . History of diverticulitis of colon   . Hyperlipidemia   . Hypertension   . IBS (irritable bowel syndrome)   . Irritable bowel syndrome     Past Surgical History:  Procedure Laterality Date  . ABDOMINAL HYSTERECTOMY  2003   tvhuso  . BREAST BIOPSY Left 08/2014   benign  . DILATION AND CURETTAGE OF UTERUS      Family History  Problem Relation Age of Onset  . Arthritis Mother   . Hyperlipidemia Mother   . Cancer Father        colon  . Arthritis Maternal Grandmother   . Hyperlipidemia Maternal Grandfather   . Diabetes Brother   . Heart disease Neg Hx   . Breast cancer Neg Hx   . Colon cancer Neg Hx     SOCIAL HX: married,  Full time employee at [employer]    Current Outpatient Medications:  .  albuterol (PROVENTIL HFA;VENTOLIN HFA) 108 (90 Base) MCG/ACT inhaler, TAKE 2 PUFFS BY MOUTH EVERY 6 HOURS AS NEEDED FOR WHEEZE OR SHORTNESS OF BREATH, Disp: 8.5 Inhaler, Rfl: 2 .  buPROPion (WELLBUTRIN XL) 150 MG 24 hr tablet, , Disp: , Rfl:  .  busPIRone (BUSPAR) 15 MG tablet, TAKE 1 TABLET (15 MG TOTAL) BY MOUTH 2 (TWO) TIMES DAILY AS NEEDED., Disp: 180 tablet, Rfl: 1 .  cetirizine (ZYRTEC) 10 MG tablet, Take  10 mg by mouth daily. Reported on 09/12/2015, Disp: , Rfl:  .  esomeprazole (NEXIUM) 40 MG capsule, TAKE 1 CAPSULE BY MOUTH EVERY DAY BEFORE BREAKFAST, Disp: 90 capsule, Rfl: 1 .  fluticasone (FLONASE) 50 MCG/ACT nasal spray, USE 2 SPRAYS EACH NOSTRIL DAILY, Disp: 16 g, Rfl: 3 .  hyoscyamine (LEVSIN SL) 0.125 MG SL tablet, PLACE 1 TABLET UNDER THE TONGUE EVERY 4 HOURS AS NEEDED, Disp: 30 tablet, Rfl: 1 .  lidocaine (LIDODERM) 5 %, Place 1 patch onto the skin daily. Remove & Discard patch within 12 hours or as directed by MD, Disp: 30 patch, Rfl: 0 .  phentermine (ADIPEX-P) 37.5 MG tablet, Take 37.5 mg by mouth every morning., Disp: , Rfl:  .  predniSONE (DELTASONE) 10 MG tablet, 6 tablets daily for 3 days, then reduce by 1  tablet daily until gone, Disp: 33 tablet, Rfl: 0 .  promethazine (PHENERGAN) 12.5 MG tablet, TAKE ONE TABLET BY MOUTH EVERY 6 HOURS AS NEEDED FOR NAUSEA AND VOMITING., Disp: 30 tablet, Rfl: 0 .  propranolol (INDERAL) 10 MG tablet, TAKE 1 TABLET (10 MG TOTAL) BY MOUTH 3 (THREE) TIMES DAILY. AS NEEDED FOR RAPID HEART RATE, Disp: 30 tablet, Rfl: 0 .  topiramate (TOPAMAX) 25 MG tablet, TAKE 1 TABLET DAILY DURING THE FIRST WEEK AND THEN INCREASE TO 2 TABLETS DAILY, Disp: , Rfl:   EXAM:  VITALS per patient if applicable:  GENERAL: alert, oriented, appears well and in no acute distress  HEENT: atraumatic, conjunttiva clear, no obvious abnormalities on inspection of external nose and ears  NECK: normal movements of the head and neck  LUNGS: on inspection no signs of respiratory distress, breathing rate appears normal, no obvious gross SOB, gasping or wheezing  CV: no obvious cyanosis  MS: moves all visible extremities without noticeable abnormality  PSYCH/NEURO: pleasant and cooperative, no obvious depression or anxiety, speech and thought processing grossly intact  ASSESSMENT AND PLAN:  Discussed the following assessment and plan:  Acute left-sided low back pain without sciatica - Plan: Ambulatory referral to Physical Therapy  Herniated intervertebral disc Current exacerbation of back pain is improving with prednisone taper.  Continue use of TENS unit; PT ordered    I discussed the assessment and treatment plan with the patient. The patient was provided an opportunity to ask questions and all were answered. The patient agreed with the plan and demonstrated an understanding of the instructions.   The patient was advised to call back or seek an in-person evaluation if the symptoms worsen or if the condition fails to improve as anticipated.  I provided 25 minutes of non-face-to-face time during this encounter.   Crecencio Mc, MD

## 2018-11-10 NOTE — Assessment & Plan Note (Signed)
Current exacerbation of back pain is improving with prednisone taper.  Continue use of TENS unit; PT ordered

## 2018-11-16 DIAGNOSIS — R635 Abnormal weight gain: Secondary | ICD-10-CM | POA: Diagnosis not present

## 2018-11-16 DIAGNOSIS — E669 Obesity, unspecified: Secondary | ICD-10-CM | POA: Diagnosis not present

## 2018-11-20 DIAGNOSIS — R635 Abnormal weight gain: Secondary | ICD-10-CM | POA: Diagnosis not present

## 2018-11-20 DIAGNOSIS — E669 Obesity, unspecified: Secondary | ICD-10-CM | POA: Diagnosis not present

## 2018-12-02 DIAGNOSIS — M5033 Other cervical disc degeneration, cervicothoracic region: Secondary | ICD-10-CM | POA: Diagnosis not present

## 2018-12-02 DIAGNOSIS — M9901 Segmental and somatic dysfunction of cervical region: Secondary | ICD-10-CM | POA: Diagnosis not present

## 2018-12-02 DIAGNOSIS — M62830 Muscle spasm of back: Secondary | ICD-10-CM | POA: Diagnosis not present

## 2018-12-02 DIAGNOSIS — M9903 Segmental and somatic dysfunction of lumbar region: Secondary | ICD-10-CM | POA: Diagnosis not present

## 2018-12-03 DIAGNOSIS — M5033 Other cervical disc degeneration, cervicothoracic region: Secondary | ICD-10-CM | POA: Diagnosis not present

## 2018-12-03 DIAGNOSIS — M9901 Segmental and somatic dysfunction of cervical region: Secondary | ICD-10-CM | POA: Diagnosis not present

## 2018-12-03 DIAGNOSIS — M62830 Muscle spasm of back: Secondary | ICD-10-CM | POA: Diagnosis not present

## 2018-12-03 DIAGNOSIS — M9903 Segmental and somatic dysfunction of lumbar region: Secondary | ICD-10-CM | POA: Diagnosis not present

## 2018-12-08 DIAGNOSIS — M9903 Segmental and somatic dysfunction of lumbar region: Secondary | ICD-10-CM | POA: Diagnosis not present

## 2018-12-08 DIAGNOSIS — M9901 Segmental and somatic dysfunction of cervical region: Secondary | ICD-10-CM | POA: Diagnosis not present

## 2018-12-08 DIAGNOSIS — M5033 Other cervical disc degeneration, cervicothoracic region: Secondary | ICD-10-CM | POA: Diagnosis not present

## 2018-12-08 DIAGNOSIS — M62830 Muscle spasm of back: Secondary | ICD-10-CM | POA: Diagnosis not present

## 2018-12-10 DIAGNOSIS — M9903 Segmental and somatic dysfunction of lumbar region: Secondary | ICD-10-CM | POA: Diagnosis not present

## 2018-12-10 DIAGNOSIS — M62830 Muscle spasm of back: Secondary | ICD-10-CM | POA: Diagnosis not present

## 2018-12-10 DIAGNOSIS — M9901 Segmental and somatic dysfunction of cervical region: Secondary | ICD-10-CM | POA: Diagnosis not present

## 2018-12-10 DIAGNOSIS — M5033 Other cervical disc degeneration, cervicothoracic region: Secondary | ICD-10-CM | POA: Diagnosis not present

## 2018-12-15 DIAGNOSIS — M5033 Other cervical disc degeneration, cervicothoracic region: Secondary | ICD-10-CM | POA: Diagnosis not present

## 2018-12-15 DIAGNOSIS — M9901 Segmental and somatic dysfunction of cervical region: Secondary | ICD-10-CM | POA: Diagnosis not present

## 2018-12-15 DIAGNOSIS — M9903 Segmental and somatic dysfunction of lumbar region: Secondary | ICD-10-CM | POA: Diagnosis not present

## 2018-12-15 DIAGNOSIS — M62830 Muscle spasm of back: Secondary | ICD-10-CM | POA: Diagnosis not present

## 2018-12-17 DIAGNOSIS — M9903 Segmental and somatic dysfunction of lumbar region: Secondary | ICD-10-CM | POA: Diagnosis not present

## 2018-12-17 DIAGNOSIS — M62830 Muscle spasm of back: Secondary | ICD-10-CM | POA: Diagnosis not present

## 2018-12-17 DIAGNOSIS — M5033 Other cervical disc degeneration, cervicothoracic region: Secondary | ICD-10-CM | POA: Diagnosis not present

## 2018-12-17 DIAGNOSIS — M9901 Segmental and somatic dysfunction of cervical region: Secondary | ICD-10-CM | POA: Diagnosis not present

## 2018-12-23 DIAGNOSIS — M5033 Other cervical disc degeneration, cervicothoracic region: Secondary | ICD-10-CM | POA: Diagnosis not present

## 2018-12-23 DIAGNOSIS — M9901 Segmental and somatic dysfunction of cervical region: Secondary | ICD-10-CM | POA: Diagnosis not present

## 2018-12-23 DIAGNOSIS — M62830 Muscle spasm of back: Secondary | ICD-10-CM | POA: Diagnosis not present

## 2018-12-23 DIAGNOSIS — M9903 Segmental and somatic dysfunction of lumbar region: Secondary | ICD-10-CM | POA: Diagnosis not present

## 2018-12-26 ENCOUNTER — Other Ambulatory Visit: Payer: Self-pay | Admitting: Internal Medicine

## 2018-12-31 DIAGNOSIS — M62830 Muscle spasm of back: Secondary | ICD-10-CM | POA: Diagnosis not present

## 2018-12-31 DIAGNOSIS — M5033 Other cervical disc degeneration, cervicothoracic region: Secondary | ICD-10-CM | POA: Diagnosis not present

## 2018-12-31 DIAGNOSIS — M9903 Segmental and somatic dysfunction of lumbar region: Secondary | ICD-10-CM | POA: Diagnosis not present

## 2018-12-31 DIAGNOSIS — M9901 Segmental and somatic dysfunction of cervical region: Secondary | ICD-10-CM | POA: Diagnosis not present

## 2019-01-05 DIAGNOSIS — M5033 Other cervical disc degeneration, cervicothoracic region: Secondary | ICD-10-CM | POA: Diagnosis not present

## 2019-01-05 DIAGNOSIS — M9903 Segmental and somatic dysfunction of lumbar region: Secondary | ICD-10-CM | POA: Diagnosis not present

## 2019-01-05 DIAGNOSIS — M62830 Muscle spasm of back: Secondary | ICD-10-CM | POA: Diagnosis not present

## 2019-01-05 DIAGNOSIS — M9901 Segmental and somatic dysfunction of cervical region: Secondary | ICD-10-CM | POA: Diagnosis not present

## 2019-01-12 ENCOUNTER — Other Ambulatory Visit: Payer: Self-pay | Admitting: Internal Medicine

## 2019-01-12 DIAGNOSIS — E7800 Pure hypercholesterolemia, unspecified: Secondary | ICD-10-CM

## 2019-01-12 DIAGNOSIS — R635 Abnormal weight gain: Secondary | ICD-10-CM

## 2019-01-19 DIAGNOSIS — M9901 Segmental and somatic dysfunction of cervical region: Secondary | ICD-10-CM | POA: Diagnosis not present

## 2019-01-19 DIAGNOSIS — M5033 Other cervical disc degeneration, cervicothoracic region: Secondary | ICD-10-CM | POA: Diagnosis not present

## 2019-01-19 DIAGNOSIS — M62830 Muscle spasm of back: Secondary | ICD-10-CM | POA: Diagnosis not present

## 2019-01-19 DIAGNOSIS — M9903 Segmental and somatic dysfunction of lumbar region: Secondary | ICD-10-CM | POA: Diagnosis not present

## 2019-02-02 ENCOUNTER — Other Ambulatory Visit: Payer: Self-pay | Admitting: Internal Medicine

## 2019-02-03 DIAGNOSIS — M9901 Segmental and somatic dysfunction of cervical region: Secondary | ICD-10-CM | POA: Diagnosis not present

## 2019-02-03 DIAGNOSIS — M62830 Muscle spasm of back: Secondary | ICD-10-CM | POA: Diagnosis not present

## 2019-02-03 DIAGNOSIS — M5033 Other cervical disc degeneration, cervicothoracic region: Secondary | ICD-10-CM | POA: Diagnosis not present

## 2019-02-03 DIAGNOSIS — M9903 Segmental and somatic dysfunction of lumbar region: Secondary | ICD-10-CM | POA: Diagnosis not present

## 2019-02-08 ENCOUNTER — Other Ambulatory Visit: Payer: Self-pay | Admitting: Internal Medicine

## 2019-02-15 DIAGNOSIS — M9901 Segmental and somatic dysfunction of cervical region: Secondary | ICD-10-CM | POA: Diagnosis not present

## 2019-02-15 DIAGNOSIS — M5033 Other cervical disc degeneration, cervicothoracic region: Secondary | ICD-10-CM | POA: Diagnosis not present

## 2019-02-15 DIAGNOSIS — M9903 Segmental and somatic dysfunction of lumbar region: Secondary | ICD-10-CM | POA: Diagnosis not present

## 2019-02-15 DIAGNOSIS — M62830 Muscle spasm of back: Secondary | ICD-10-CM | POA: Diagnosis not present

## 2019-03-01 DIAGNOSIS — M9901 Segmental and somatic dysfunction of cervical region: Secondary | ICD-10-CM | POA: Diagnosis not present

## 2019-03-01 DIAGNOSIS — M5033 Other cervical disc degeneration, cervicothoracic region: Secondary | ICD-10-CM | POA: Diagnosis not present

## 2019-03-01 DIAGNOSIS — M62830 Muscle spasm of back: Secondary | ICD-10-CM | POA: Diagnosis not present

## 2019-03-01 DIAGNOSIS — M9903 Segmental and somatic dysfunction of lumbar region: Secondary | ICD-10-CM | POA: Diagnosis not present

## 2019-03-04 ENCOUNTER — Ambulatory Visit (INDEPENDENT_AMBULATORY_CARE_PROVIDER_SITE_OTHER): Payer: BC Managed Care – PPO | Admitting: Family Medicine

## 2019-03-04 ENCOUNTER — Other Ambulatory Visit: Payer: Self-pay

## 2019-03-04 ENCOUNTER — Other Ambulatory Visit: Payer: Self-pay | Admitting: Internal Medicine

## 2019-03-04 DIAGNOSIS — J019 Acute sinusitis, unspecified: Secondary | ICD-10-CM | POA: Diagnosis not present

## 2019-03-04 DIAGNOSIS — L853 Xerosis cutis: Secondary | ICD-10-CM

## 2019-03-04 MED ORDER — DOXYCYCLINE HYCLATE 100 MG PO TABS: 100.0000 mg | ORAL_TABLET | Freq: Two times a day (BID) | ORAL | 0 refills | Status: DC

## 2019-03-04 NOTE — Telephone Encounter (Signed)
Pt has been scheduled with Philis Nettle, NP this afternoon. Pt is aware of appt date and time.

## 2019-03-04 NOTE — Progress Notes (Signed)
Patient ID: Colleen Washington, female   DOB: [birth year], 58 y.o.   MRN: [id number]    Virtual Visit via video Note  This visit type was conducted due to national recommendations for restrictions regarding the COVID-19 pandemic (e.g. social distancing).  This format is felt to be most appropriate for this patient at this time.  All issues noted in this document were discussed and addressed.  No physical exam was performed (except for noted visual exam findings with Video Visits).   I connected with Colleen Washington today at  4:00 PM EDT by a video enabled telemedicine application or telephone and verified that I am speaking with the correct person using two identifiers. Location patient: home Location provider: work or home office Persons participating in the virtual visit: patient, provider  I discussed the limitations, risks, security and privacy concerns of performing an evaluation and management service by telephone and the availability of in person appointments. I also discussed with the patient that there may be a patient responsible charge related to this service. The patient expressed understanding and agreed to proceed.   HPI:  Patient and I connected via video due to complaints of frontal headache across sinuses, feeling congested in sinuses and bilateral ear pain is been present for almost 2 weeks.  Patient at first thought this was allergy related and started doing some saline nasal spray, but this has not helped to reduce the congestion.  Does feel drainage down back of throat and fullness in ears.  Denies fever or chills.  Denies cough, shortness of breath or wheezing.  She is diligent with wearing her mask and handwashing.  Patient also complains of dryness on bilateral feet and on palm of hand.  Patient states she has had dry patches on both feet and hand for many months off and on.  She has tried various over-the-counter creams but nothing seems to work to improve dryness.   ROS: See  pertinent positives and negatives per HPI.  Past Medical History:  Diagnosis Date  . allergic rhinitis   . Anxiety   . AR (allergic rhinitis)   . Depression   . Endometriosis   . GERD (gastroesophageal reflux disease)   . H/O migraine   . Herniated intervertebral disc   . History of diverticulitis of colon   . Hyperlipidemia   . Hypertension   . IBS (irritable bowel syndrome)   . Irritable bowel syndrome     Past Surgical History:  Procedure Laterality Date  . ABDOMINAL HYSTERECTOMY  2003   tvhuso  . BREAST BIOPSY Left 08/2014   benign  . DILATION AND CURETTAGE OF UTERUS      Family History  Problem Relation Age of Onset  . Arthritis Mother   . Hyperlipidemia Mother   . Cancer Father        colon  . Arthritis Maternal Grandmother   . Hyperlipidemia Maternal Grandfather   . Diabetes Brother   . Heart disease Neg Hx   . Breast cancer Neg Hx   . Colon cancer Neg Hx    Social History   Tobacco Use  . Smoking status: Current Some Day Smoker    Packs/day: 0.00    Years: 20.00    Pack years: 0.00    Types: Cigarettes  . Smokeless tobacco: Never Used  Substance Use Topics  . Alcohol use: Yes    Alcohol/week: 0.0 standard drinks    Comment: occas    Current Outpatient Medications:  .  albuterol (PROVENTIL HFA;VENTOLIN  HFA) 108 (90 Base) MCG/ACT inhaler, TAKE 2 PUFFS BY MOUTH EVERY 6 HOURS AS NEEDED FOR WHEEZE OR SHORTNESS OF BREATH, Disp: 8.5 Inhaler, Rfl: 2 .  buPROPion (WELLBUTRIN XL) 150 MG 24 hr tablet, TAKE ONE (1) TABLET BY MOUTH EVERY DAY, Disp: 30 tablet, Rfl: 5 .  busPIRone (BUSPAR) 15 MG tablet, TAKE 1 TABLET (15 MG TOTAL) BY MOUTH 2 (TWO) TIMES DAILY AS NEEDED., Disp: 60 tablet, Rfl: 3 .  cetirizine (ZYRTEC) 10 MG tablet, Take 10 mg by mouth daily. Reported on 09/12/2015, Disp: , Rfl:  .  esomeprazole (NEXIUM) 40 MG capsule, TAKE 1 CAPSULE BY MOUTH EVERY DAY BEFORE BREAKFAST, Disp: 90 capsule, Rfl: 1 .  fluticasone (FLONASE) 50 MCG/ACT nasal spray, USE 2  SPRAYS EACH NOSTRIL DAILY, Disp: 16 g, Rfl: 3 .  hyoscyamine (LEVSIN SL) 0.125 MG SL tablet, PLACE 1 TABLET UNDER THE TONGUE EVERY 4 HOURS AS NEEDED, Disp: 30 tablet, Rfl: 1 .  lidocaine (LIDODERM) 5 %, Place 1 patch onto the skin daily. Remove & Discard patch within 12 hours or as directed by MD, Disp: 30 patch, Rfl: 0 .  phentermine (ADIPEX-P) 37.5 MG tablet, Take 37.5 mg by mouth every morning., Disp: , Rfl:  .  predniSONE (DELTASONE) 10 MG tablet, 6 tablets daily for 3 days, then reduce by 1 tablet daily until gone, Disp: 33 tablet, Rfl: 0 .  promethazine (PHENERGAN) 12.5 MG tablet, TAKE ONE TABLET BY MOUTH EVERY 6 HOURS AS NEEDED FOR NAUSEA AND VOMITING., Disp: 30 tablet, Rfl: 0 .  propranolol (INDERAL) 10 MG tablet, TAKE 1 TABLET (10 MG TOTAL) BY MOUTH 3 (THREE) TIMES DAILY. AS NEEDED FOR RAPID HEART RATE, Disp: 30 tablet, Rfl: 0 .  topiramate (TOPAMAX) 25 MG tablet, TAKE 1 TABLET DAILY DURING THE FIRST WEEK AND THEN INCREASE TO 2 TABLETS DAILY, Disp: , Rfl:   EXAM:  GENERAL: alert, oriented, appears well and in no acute distress  HEENT: atraumatic, conjunttiva clear, no obvious abnormalities on inspection of external nose and ears  NECK: normal movements of the head and neck  LUNGS: on inspection no signs of respiratory distress, breathing rate appears normal, no obvious gross SOB, gasping or wheezing  CV: no obvious cyanosis  MS: moves all visible extremities without noticeable abnormality  PSYCH/NEURO: pleasant and cooperative, no obvious depression or anxiety, speech and thought processing grossly intact  ASSESSMENT AND PLAN:  Discussed the following assessment and plan:  Sinusitis-patient symptoms do sound consistent with a sinusitis due to congestion, headache and ear pain being present for almost 2 weeks.  We will treat with doxycycline twice daily for 10 days.  She also has been advised to do saline nasal rinses at least once daily to help rinse out sinuses and is  encouraged to take an oral antihistamine like Claritin once a day to help dry up sinuses and ears.  Advised to keep up with fluid intake, monitoring for any changes or worsening in symptoms.  Dry skin on feet and hands-suggested patient try a thicker ointment style skin treatment such as Aquaphor skin healing ointment and advised to apply thick layer to feet and cover with sock before going to bed each night to really help skin become moisturized.  Advised that she can also put Aquaphor ointment on her hand and wear a glove before bedtime to help really combat the dry skin.  Advised that if this treatment plan does not work, we can consider prescription strength medication such as urea 40% and/or referral to dermatology.  I discussed the assessment and treatment plan with the patient. The patient was provided an opportunity to ask questions and all were answered. The patient agreed with the plan and demonstrated an understanding of the instructions.   The patient was advised to call back or seek an in-person evaluation if the symptoms worsen or if the condition fails to improve as anticipated.   Jodelle Green, FNP

## 2019-03-10 ENCOUNTER — Encounter: Payer: Self-pay | Admitting: Family Medicine

## 2019-03-10 DIAGNOSIS — M62830 Muscle spasm of back: Secondary | ICD-10-CM | POA: Diagnosis not present

## 2019-03-10 DIAGNOSIS — M9901 Segmental and somatic dysfunction of cervical region: Secondary | ICD-10-CM | POA: Diagnosis not present

## 2019-03-10 DIAGNOSIS — M5033 Other cervical disc degeneration, cervicothoracic region: Secondary | ICD-10-CM | POA: Diagnosis not present

## 2019-03-10 DIAGNOSIS — M9903 Segmental and somatic dysfunction of lumbar region: Secondary | ICD-10-CM | POA: Diagnosis not present

## 2019-03-10 DIAGNOSIS — B373 Candidiasis of vulva and vagina: Secondary | ICD-10-CM

## 2019-03-10 DIAGNOSIS — B3731 Acute candidiasis of vulva and vagina: Secondary | ICD-10-CM

## 2019-03-11 ENCOUNTER — Telehealth: Payer: Self-pay | Admitting: Lab

## 2019-03-11 MED ORDER — FLUCONAZOLE 150 MG PO TABS: 150.0000 mg | ORAL_TABLET | Freq: Once | ORAL | 0 refills | Status: AC

## 2019-03-11 NOTE — Telephone Encounter (Signed)
Good Morning, just wanted to know if you can prescribe a yeast infection pill some I am taking a antibiotic.   Thank you and have a nice day

## 2019-03-15 DIAGNOSIS — M62830 Muscle spasm of back: Secondary | ICD-10-CM | POA: Diagnosis not present

## 2019-03-15 DIAGNOSIS — M9903 Segmental and somatic dysfunction of lumbar region: Secondary | ICD-10-CM | POA: Diagnosis not present

## 2019-03-15 DIAGNOSIS — M5033 Other cervical disc degeneration, cervicothoracic region: Secondary | ICD-10-CM | POA: Diagnosis not present

## 2019-03-15 DIAGNOSIS — M9901 Segmental and somatic dysfunction of cervical region: Secondary | ICD-10-CM | POA: Diagnosis not present

## 2019-03-17 DIAGNOSIS — E669 Obesity, unspecified: Secondary | ICD-10-CM | POA: Diagnosis not present

## 2019-03-17 DIAGNOSIS — R635 Abnormal weight gain: Secondary | ICD-10-CM | POA: Diagnosis not present

## 2019-03-24 ENCOUNTER — Other Ambulatory Visit: Payer: Self-pay | Admitting: Internal Medicine

## 2019-03-28 ENCOUNTER — Other Ambulatory Visit: Payer: Self-pay | Admitting: Internal Medicine

## 2019-03-29 DIAGNOSIS — M62830 Muscle spasm of back: Secondary | ICD-10-CM | POA: Diagnosis not present

## 2019-03-29 DIAGNOSIS — M5033 Other cervical disc degeneration, cervicothoracic region: Secondary | ICD-10-CM | POA: Diagnosis not present

## 2019-03-29 DIAGNOSIS — M9901 Segmental and somatic dysfunction of cervical region: Secondary | ICD-10-CM | POA: Diagnosis not present

## 2019-03-29 DIAGNOSIS — M9903 Segmental and somatic dysfunction of lumbar region: Secondary | ICD-10-CM | POA: Diagnosis not present

## 2019-04-12 DIAGNOSIS — M9901 Segmental and somatic dysfunction of cervical region: Secondary | ICD-10-CM | POA: Diagnosis not present

## 2019-04-12 DIAGNOSIS — M9903 Segmental and somatic dysfunction of lumbar region: Secondary | ICD-10-CM | POA: Diagnosis not present

## 2019-04-12 DIAGNOSIS — M62830 Muscle spasm of back: Secondary | ICD-10-CM | POA: Diagnosis not present

## 2019-04-12 DIAGNOSIS — M5033 Other cervical disc degeneration, cervicothoracic region: Secondary | ICD-10-CM | POA: Diagnosis not present

## 2019-04-26 DIAGNOSIS — M9903 Segmental and somatic dysfunction of lumbar region: Secondary | ICD-10-CM | POA: Diagnosis not present

## 2019-04-26 DIAGNOSIS — M5033 Other cervical disc degeneration, cervicothoracic region: Secondary | ICD-10-CM | POA: Diagnosis not present

## 2019-04-26 DIAGNOSIS — M9901 Segmental and somatic dysfunction of cervical region: Secondary | ICD-10-CM | POA: Diagnosis not present

## 2019-04-26 DIAGNOSIS — M62830 Muscle spasm of back: Secondary | ICD-10-CM | POA: Diagnosis not present

## 2019-05-10 DIAGNOSIS — M9901 Segmental and somatic dysfunction of cervical region: Secondary | ICD-10-CM | POA: Diagnosis not present

## 2019-05-10 DIAGNOSIS — M62830 Muscle spasm of back: Secondary | ICD-10-CM | POA: Diagnosis not present

## 2019-05-10 DIAGNOSIS — M5033 Other cervical disc degeneration, cervicothoracic region: Secondary | ICD-10-CM | POA: Diagnosis not present

## 2019-05-10 DIAGNOSIS — M9903 Segmental and somatic dysfunction of lumbar region: Secondary | ICD-10-CM | POA: Diagnosis not present

## 2019-05-27 ENCOUNTER — Other Ambulatory Visit: Payer: Self-pay | Admitting: Internal Medicine

## 2019-06-14 DIAGNOSIS — M5033 Other cervical disc degeneration, cervicothoracic region: Secondary | ICD-10-CM | POA: Diagnosis not present

## 2019-06-14 DIAGNOSIS — M9903 Segmental and somatic dysfunction of lumbar region: Secondary | ICD-10-CM | POA: Diagnosis not present

## 2019-06-14 DIAGNOSIS — M62830 Muscle spasm of back: Secondary | ICD-10-CM | POA: Diagnosis not present

## 2019-06-14 DIAGNOSIS — M9901 Segmental and somatic dysfunction of cervical region: Secondary | ICD-10-CM | POA: Diagnosis not present

## 2019-06-17 ENCOUNTER — Ambulatory Visit (INDEPENDENT_AMBULATORY_CARE_PROVIDER_SITE_OTHER)
Admission: RE | Admit: 2019-06-17 | Discharge: 2019-06-17 | Disposition: A | Discharge status: Home / Self Care | Payer: BC Managed Care – PPO | Source: Ambulatory Visit

## 2019-06-17 DIAGNOSIS — L301 Dyshidrosis [pompholyx]: Secondary | ICD-10-CM

## 2019-06-17 DIAGNOSIS — R21 Rash and other nonspecific skin eruption: Secondary | ICD-10-CM

## 2019-06-17 MED ORDER — PREDNISONE 20 MG PO TABS: 20.0000 mg | ORAL_TABLET | Freq: Two times a day (BID) | ORAL | 0 refills | Status: AC

## 2019-06-17 MED ORDER — FLUOCINONIDE-E 0.05 % EX CREA: 1.0000 "application " | TOPICAL_CREAM | Freq: Two times a day (BID) | CUTANEOUS | 0 refills | Status: DC

## 2019-06-17 MED ORDER — TRIAMCINOLONE ACETONIDE 0.1 % EX CREA: 1.0000 "application " | TOPICAL_CREAM | Freq: Two times a day (BID) | CUTANEOUS | 0 refills | Status: DC

## 2019-06-17 NOTE — Discharge Instructions (Signed)
Symptoms sound consistent with dyshidrotic eczema, however, because I am unable to see rash on video I would recommend coming in person to be seen.  Patient declines at this time.  Would like to try outpatient therapy first.  Aware of risk associated with decision including missed diagnosis, delayed treatment, organ damage/ failure, and/or death.  Patient aware and in agreement, would like to proceed with below plan:  Wash with warm water and mild soap Avoid hot showers or baths Moisturize skin daily Use lidex or fluocinonide ointment on hands.  Apply at night and wear thin cotton gloves to help absorb the ointment over night Triamcinolone or kenalog cream for other areas of eczema.  Avoid using greater than 2 weeks on face or flexural areas which may be prone to skin thinning Take oral steroid as prescribed and to completion Use OTC zyrtec, claritin, or allegra during the day Please make a follow up appointment PCP or dermatology for further evaluation and management Follow up in person or go to the ED if you have any new or worsen symptoms such as fever, chills, nausea, vomiting, redness, swelling, discharge, oral lesions, shortness of breath, chest pain, abdominal pain, changes in bowel or bladder function, etc..[name]

## 2019-06-17 NOTE — ED Provider Notes (Addendum)
[place]     Virtual Visit via Video Note:  Colleen Washington  initiated request for Telemedicine visit with [facility] team. I connected with Colleen Washington  on 06/17/2019 at 12:14 PM  for a synchronized telemedicine visit using a video enabled HIPPA compliant telemedicine application. I verified that I am speaking with Colleen Washington  using two identifiers. [name], PA-C  was physically located in a [facility] Urgent care site and [name] was located at a different location.   The limitations of evaluation and management by telemedicine as well as the availability of in-person appointments were discussed. Patient was informed that she  may incur a bill ( including co-pay) for this virtual visit encounter. Colleen Washington  expressed understanding and gave verbal consent to proceed with virtual visit.   [id number] 06/17/19 Arrival Time: [id number]  CC: Rash  SUBJECTIVE:  Colleen Washington is a 58 y.o. female who presents with a RT foot rash x 6 months, and bilateral hand rash x 1 month.  Denies precipitating event or trauma.  Denies changes in soaps, detergents, close contacts with similar rash, known trigger, environmental exposure, or allergy. Denies medications change or starting a new medication recently.  Patient is sexually active with husband and is not concerned for STDs.  Localizes the rash to bottom of RT foot, and bilateral palms of hands.  Describes it as red, and intermittent inflamed/ painful at times. States it looks like bumps, and the skin is peeling.  Has tried OTC aquafore per PCP recommendation without relief.  Deneis aggravating factors.  Denies similar symptoms in the past.   Denies fever, chills, nausea, vomiting, erythema, swelling, discharge, oral lesions, SOB, chest pain, abdominal pain, changes in bowel or bladder function.    ROS: As per HPI.  All other pertinent ROS negative.     Past Medical History:  Diagnosis Date  . allergic rhinitis   .  Anxiety   . AR (allergic rhinitis)   . Depression   . Endometriosis   . GERD (gastroesophageal reflux disease)   . H/O migraine   . Herniated intervertebral disc   . History of diverticulitis of colon   . Hyperlipidemia   . Hypertension   . IBS (irritable bowel syndrome)   . Irritable bowel syndrome    Past Surgical History:  Procedure Laterality Date  . ABDOMINAL HYSTERECTOMY  2003   tvhuso  . BREAST BIOPSY Left 08/2014   benign  . DILATION AND CURETTAGE OF UTERUS     Allergies  Allergen Reactions  . Compazine [Prochlorperazine] Swelling   No current facility-administered medications on file prior to encounter.    Current Outpatient Medications on File Prior to Encounter  Medication Sig Dispense Refill  . albuterol (VENTOLIN HFA) 108 (90 Base) MCG/ACT inhaler TAKE 2 PUFFS BY MOUTH EVERY 6 HOURS AS NEEDED FOR WHEEZE OR SHORTNESS OF BREATH 8.5 g 2  . buPROPion (WELLBUTRIN XL) 150 MG 24 hr tablet TAKE ONE (1) TABLET BY MOUTH EVERY DAY 90 tablet 2  . busPIRone (BUSPAR) 15 MG tablet TAKE 1 TABLET (15 MG TOTAL) BY MOUTH 2 (TWO) TIMES DAILY AS NEEDED. 180 tablet 2  . cetirizine (ZYRTEC) 10 MG tablet Take 10 mg by mouth daily. Reported on 09/12/2015    . doxycycline (VIBRA-TABS) 100 MG tablet Take 1 tablet (100 mg total) by mouth 2 (two) times daily. 20 tablet 0  . esomeprazole (NEXIUM) 40 MG capsule TAKE 1 CAPSULE BY  MOUTH EVERY DAY BEFORE BREAKFAST 90 capsule 1  . fluticasone (FLONASE) 50 MCG/ACT nasal spray USE 2 SPRAYS EACH NOSTRIL DAILY 16 g 3  . hyoscyamine (LEVSIN SL) 0.125 MG SL tablet PLACE 1 TABLET UNDER THE TONGUE EVERY 4 HOURS AS NEEDED 30 tablet 1  . lidocaine (LIDODERM) 5 % Place 1 patch onto the skin daily. Remove & Discard patch within 12 hours or as directed by MD 30 patch 0  . phentermine (ADIPEX-P) 37.5 MG tablet Take 37.5 mg by mouth every morning.    . promethazine (PHENERGAN) 12.5 MG tablet TAKE ONE TABLET BY MOUTH EVERY 6 HOURS AS NEEDED FOR NAUSEA AND VOMITING.  30 tablet 0  . propranolol (INDERAL) 10 MG tablet TAKE 1 TABLET (10 MG TOTAL) BY MOUTH 3 (THREE) TIMES DAILY. AS NEEDED FOR RAPID HEART RATE 30 tablet 0  . topiramate (TOPAMAX) 25 MG tablet TAKE 1 TABLET DAILY DURING THE FIRST WEEK AND THEN INCREASE TO 2 TABLETS DAILY      OBJECTIVE: There were no vitals filed for this visit.  Patient able to connect to video, but sound unavailable. I was unable to see the rash, and proceeded to call patient for HPI  General appearance: alert; no distress Eyes: EOMI grossly HENT: normocephalic; atraumatic Neck: supple with FROM Lungs: normal respiratory effort; speaking in full sentences without difficulty Extremities: moves extremities without difficulty Neurologic: normal facial expressions Psychological: alert and cooperative; normal mood and affect  ASSESSMENT & PLAN:  1. Dyshidrotic eczema   2. Rash and nonspecific skin eruption     Meds ordered this encounter  Medications  . fluocinonide-emollient (LIDEX-E) 0.05 % cream    Sig: Apply 1 application topically 2 (two) times daily.    Dispense:  30 g    Refill:  0    Order Specific Question:   Supervising Provider    Answer:   Raylene Everts [id number]  . triamcinolone cream (KENALOG) 0.1 %    Sig: Apply 1 application topically 2 (two) times daily.    Dispense:  30 g    Refill:  0    Order Specific Question:   Supervising Provider    Answer:   Raylene Everts [id number]  . predniSONE (DELTASONE) 20 MG tablet    Sig: Take 1 tablet (20 mg total) by mouth 2 (two) times daily with a meal for 5 days.    Dispense:  10 tablet    Refill:  0    Order Specific Question:   Supervising Provider    Answer:   Raylene Everts [id number]    Symptoms sound consistent with dyshidrotic eczema, however, because I am unable to see rash on video I would recommend coming in person to be seen.  Patient declines at this time.  Would like to try outpatient therapy first.  Aware of risk associated with  decision including missed diagnosis, delayed treatment, organ damage/ failure, and/or death.  Patient aware and in agreement, would like to proceed with below plan:  Wash with warm water and mild soap Avoid hot showers or baths Moisturize skin daily Use lidex or fluocinonide ointment on hands.  Apply at night and wear thin cotton gloves to help absorb the ointment over night Triamcinolone or kenalog cream for other areas of eczema.  Avoid using greater than 2 weeks on face or flexural areas which may be prone to skin thinning Take oral steroid as prescribed and to completion Use OTC zyrtec, claritin, or allegra during the day Please make a  follow up appointment PCP or dermatology for further evaluation and management Follow up in person or go to the ED if you have any new or worsen symptoms such as fever, chills, nausea, vomiting, redness, swelling, discharge, oral lesions, shortness of breath, chest pain, abdominal pain, changes in bowel or bladder function, etc...  I discussed the assessment and treatment plan with the patient. The patient was provided an opportunity to ask questions and all were answered. The patient agreed with the plan and demonstrated an understanding of the instructions.   The patient was advised to call back or seek an in-person evaluation if the symptoms worsen or if the condition fails to improve as anticipated.  I provided 8 minutes of non-face-to-face time during this encounter.  [place], PA-C  06/17/2019 12:14 PM    Stacey Drain, [place], PA-C 06/17/19 1218    Stacey Drain [place], PA-C 06/17/19 1219

## 2019-06-21 ENCOUNTER — Other Ambulatory Visit: Payer: Self-pay

## 2019-06-21 ENCOUNTER — Other Ambulatory Visit: Payer: Self-pay | Admitting: Internal Medicine

## 2019-06-29 ENCOUNTER — Other Ambulatory Visit: Payer: Self-pay | Admitting: Internal Medicine

## 2019-08-31 DIAGNOSIS — R21 Rash and other nonspecific skin eruption: Secondary | ICD-10-CM

## 2019-08-31 DIAGNOSIS — L403 Pustulosis palmaris et plantaris: Secondary | ICD-10-CM

## 2019-09-04 DIAGNOSIS — L403 Pustulosis palmaris et plantaris: Secondary | ICD-10-CM | POA: Insufficient documentation

## 2019-09-04 MED ORDER — BETAMETHASONE DIPROPIONATE 0.05 % EX CREA: TOPICAL_CREAM | Freq: Two times a day (BID) | CUTANEOUS | 0 refills | Status: DC

## 2019-09-04 NOTE — Assessment & Plan Note (Signed)
Suggested by photos.  Derm referral in progress; trial of diprolene.

## 2019-09-04 NOTE — Addendum Note (Signed)
Addended by: Crecencio Mc on: 09/04/2019 03:20 PM   Modules accepted: Orders

## 2019-09-15 NOTE — Telephone Encounter (Signed)
Pt called about the Dermatology referral they said that it needed to go through [facility] and not faxed

## 2019-09-18 ENCOUNTER — Other Ambulatory Visit: Payer: Self-pay | Admitting: Internal Medicine

## 2019-09-20 DIAGNOSIS — M9903 Segmental and somatic dysfunction of lumbar region: Secondary | ICD-10-CM | POA: Diagnosis not present

## 2019-09-20 DIAGNOSIS — M5033 Other cervical disc degeneration, cervicothoracic region: Secondary | ICD-10-CM | POA: Diagnosis not present

## 2019-09-20 DIAGNOSIS — M62830 Muscle spasm of back: Secondary | ICD-10-CM | POA: Diagnosis not present

## 2019-09-20 DIAGNOSIS — M9901 Segmental and somatic dysfunction of cervical region: Secondary | ICD-10-CM | POA: Diagnosis not present

## 2019-09-22 ENCOUNTER — Telehealth: Payer: Self-pay | Admitting: Internal Medicine

## 2019-09-22 DIAGNOSIS — M62830 Muscle spasm of back: Secondary | ICD-10-CM | POA: Diagnosis not present

## 2019-09-22 DIAGNOSIS — M5033 Other cervical disc degeneration, cervicothoracic region: Secondary | ICD-10-CM | POA: Diagnosis not present

## 2019-09-22 DIAGNOSIS — M9901 Segmental and somatic dysfunction of cervical region: Secondary | ICD-10-CM | POA: Diagnosis not present

## 2019-09-22 DIAGNOSIS — M9903 Segmental and somatic dysfunction of lumbar region: Secondary | ICD-10-CM | POA: Diagnosis not present

## 2019-09-22 NOTE — Telephone Encounter (Signed)
[facility] called they need the referral faxed to them they don't start Epic till Monday  Fax # [phone]

## 2019-09-23 DIAGNOSIS — L400 Psoriasis vulgaris: Secondary | ICD-10-CM | POA: Diagnosis not present

## 2019-09-27 DIAGNOSIS — M9903 Segmental and somatic dysfunction of lumbar region: Secondary | ICD-10-CM | POA: Diagnosis not present

## 2019-09-27 DIAGNOSIS — M5033 Other cervical disc degeneration, cervicothoracic region: Secondary | ICD-10-CM | POA: Diagnosis not present

## 2019-09-27 DIAGNOSIS — M62830 Muscle spasm of back: Secondary | ICD-10-CM | POA: Diagnosis not present

## 2019-09-27 DIAGNOSIS — M9901 Segmental and somatic dysfunction of cervical region: Secondary | ICD-10-CM | POA: Diagnosis not present

## 2019-10-04 DIAGNOSIS — M5033 Other cervical disc degeneration, cervicothoracic region: Secondary | ICD-10-CM | POA: Diagnosis not present

## 2019-10-04 DIAGNOSIS — M9901 Segmental and somatic dysfunction of cervical region: Secondary | ICD-10-CM | POA: Diagnosis not present

## 2019-10-04 DIAGNOSIS — M9903 Segmental and somatic dysfunction of lumbar region: Secondary | ICD-10-CM | POA: Diagnosis not present

## 2019-10-04 DIAGNOSIS — M62830 Muscle spasm of back: Secondary | ICD-10-CM | POA: Diagnosis not present

## 2019-10-11 DIAGNOSIS — M9903 Segmental and somatic dysfunction of lumbar region: Secondary | ICD-10-CM | POA: Diagnosis not present

## 2019-10-11 DIAGNOSIS — M9901 Segmental and somatic dysfunction of cervical region: Secondary | ICD-10-CM | POA: Diagnosis not present

## 2019-10-11 DIAGNOSIS — M62830 Muscle spasm of back: Secondary | ICD-10-CM | POA: Diagnosis not present

## 2019-10-11 DIAGNOSIS — M5033 Other cervical disc degeneration, cervicothoracic region: Secondary | ICD-10-CM | POA: Diagnosis not present

## 2019-10-13 DIAGNOSIS — R300 Dysuria: Secondary | ICD-10-CM | POA: Diagnosis not present

## 2019-10-13 DIAGNOSIS — N3000 Acute cystitis without hematuria: Secondary | ICD-10-CM | POA: Diagnosis not present

## 2019-10-18 DIAGNOSIS — M5033 Other cervical disc degeneration, cervicothoracic region: Secondary | ICD-10-CM | POA: Diagnosis not present

## 2019-10-18 DIAGNOSIS — M62830 Muscle spasm of back: Secondary | ICD-10-CM | POA: Diagnosis not present

## 2019-10-18 DIAGNOSIS — M9903 Segmental and somatic dysfunction of lumbar region: Secondary | ICD-10-CM | POA: Diagnosis not present

## 2019-10-18 DIAGNOSIS — M9901 Segmental and somatic dysfunction of cervical region: Secondary | ICD-10-CM | POA: Diagnosis not present

## 2019-10-21 ENCOUNTER — Other Ambulatory Visit: Payer: Self-pay | Admitting: Internal Medicine

## 2019-10-21 ENCOUNTER — Other Ambulatory Visit: Payer: Self-pay

## 2019-10-21 ENCOUNTER — Encounter: Payer: Self-pay | Admitting: Internal Medicine

## 2019-10-21 ENCOUNTER — Ambulatory Visit (INDEPENDENT_AMBULATORY_CARE_PROVIDER_SITE_OTHER): Payer: BC Managed Care – PPO | Admitting: Internal Medicine

## 2019-10-21 VITALS — BP 100/80 | HR 84 | Ht 67.0 in | Wt 201.2 lb

## 2019-10-21 DIAGNOSIS — R002 Palpitations: Secondary | ICD-10-CM

## 2019-10-21 DIAGNOSIS — R079 Chest pain, unspecified: Secondary | ICD-10-CM

## 2019-10-21 DIAGNOSIS — R0600 Dyspnea, unspecified: Secondary | ICD-10-CM | POA: Diagnosis not present

## 2019-10-21 DIAGNOSIS — I10 Essential (primary) hypertension: Secondary | ICD-10-CM

## 2019-10-21 DIAGNOSIS — R0609 Other forms of dyspnea: Secondary | ICD-10-CM

## 2019-10-21 DIAGNOSIS — I491 Atrial premature depolarization: Secondary | ICD-10-CM | POA: Diagnosis not present

## 2019-10-21 DIAGNOSIS — Z720 Tobacco use: Secondary | ICD-10-CM

## 2019-10-21 MED ORDER — METOPROLOL TARTRATE 50 MG PO TABS: ORAL_TABLET | ORAL | 0 refills | Status: DC

## 2019-10-21 NOTE — Patient Instructions (Addendum)
Dr End recommends your decrease your intake of caffeine and stop taking diet pills.   Medication Instructions:  Your physician recommends that you continue on your current medications as directed. Please refer to the Current Medication list given to you today.  *If you need a refill on your cardiac medications before your next appointment, please call your pharmacy*   Lab Work: Your physician recommends that you return for lab work within the week prior to CT once it is scheduled. BMET - Please go to the [facility]. You will check in at the front desk to the right as you walk into the atrium. Valet Parking is offered if needed. - No appointment needed. You may go any day between 7 am and 6 pm.  If you have labs (blood work) drawn today and your tests are completely normal, you will receive your results only by: [name] MyChart Message (if you have MyChart) OR . A paper copy in the mail If you have any lab test that is abnormal or we need to change your treatment, we will call you to review the results.   Testing/Procedures: Your cardiac CT will be scheduled at one of the below locations:   [facility] [address] [place], [place] [address] [phone]  [place] [address] [place], [place] [address] [phone]  If scheduled at [facility], please arrive at the [facility] main entrance of [facility] 30 minutes prior to test start time. Proceed to the [facility] Radiology Department (first floor) to check-in and test prep.  If scheduled at [facility], please arrive 15 mins early for check-in and test prep.  Please follow these instructions carefully (unless otherwise directed):  On the Night Before the Test: . Be sure to Drink plenty of water. . Do not consume any caffeinated/decaffeinated beverages or chocolate 12 hours prior to your test. . Do not take  any antihistamines 12 hours prior to your test. . If you take Metformin do not take 24 hours prior to test.  On the Day of the Test: . Drink plenty of water. Do not drink any water within one hour of the test. . Do not eat any food 4 hours prior to the test. . You may take your regular medications prior to the test.  . Take metoprolol (Lopressor) two hours prior to test. . HOLD Furosemide/Hydrochlorothiazide morning of the test. . FEMALES- please wear underwire-free bra if available      After the Test: . Drink plenty of water. . After receiving IV contrast, you may experience a mild flushed feeling. This is normal. . On occasion, you may experience a mild rash up to 24 hours after the test. This is not dangerous. If this occurs, you can take Benadryl 25 mg and increase your fluid intake. . If you experience trouble breathing, this can be serious. If it is severe call 911 IMMEDIATELY. If it is mild, please call our office. . If you take any of these medications: Glipizide/Metformin, Avandament, Glucavance, please do not take 48 hours after completing test unless otherwise instructed.   Once we have confirmed authorization from your insurance company, we will call you to set up a date and time for your test.   For non-scheduling related questions, please contact the cardiac imaging nurse navigator should you have any questions/concerns: Marchia Bond, RN Navigator Cardiac Imaging [facility] [phone] office  For scheduling needs, including cancellations and rescheduling, please call [phone].      Follow-Up: At [facility], you and your health needs are our priority.  As part of our continuing mission to provide you with exceptional heart care, we have created designated Provider Care Teams.  These Care Teams include your primary Cardiologist (physician) and Advanced Practice Providers (APPs -  Physician Assistants and Nurse Practitioners) who all  work together to provide you with the care you need, when you need it.  We recommend signing up for the patient portal called "MyChart".  Sign up information is provided on this After Visit Summary.  MyChart is used to connect with patients for Virtual Visits (Telemedicine).  Patients are able to view lab/test results, encounter notes, upcoming appointments, etc.  Non-urgent messages can be sent to your provider as well.   To learn more about what you can do with MyChart, go to [email or web address].    Your next appointment:   8 week(s)  The format for your next appointment:   In Person  Provider:    You may see DR Harrell Gave END or one of the following Advanced Practice Providers on your designated Care Team:    Murray Hodgkins, NP  Christell Faith, PA-C  Marrianne Mood, PA-C    Cardiac CT Angiogram A cardiac CT angiogram is a procedure to look at the heart and the area around the heart. It may be done to help find the cause of chest pains or other symptoms of heart disease. During this procedure, a substance called contrast dye is injected into the blood vessels in the area to be checked. A large X-ray machine, called a CT scanner, then takes detailed pictures of the heart and the surrounding area. The procedure is also sometimes called a coronary CT angiogram, coronary artery scanning, or CTA. A cardiac CT angiogram allows the health care provider to see how well blood is flowing to and from the heart. The health care provider will be able to see if there are any problems, such as:  Blockage or narrowing of the coronary arteries in the heart.  Fluid around the heart.  Signs of weakness or disease in the muscles, valves, and tissues of the heart. Tell a health care provider about:  Any allergies you have. This is especially important if you have had a previous allergic reaction to contrast dye.  All medicines you are taking, including vitamins, herbs, eye drops, creams, and  over-the-counter medicines.  Any blood disorders you have.  Any surgeries you have had.  Any medical conditions you have.  Whether you are pregnant or may be pregnant.  Any anxiety disorders, chronic pain, or other conditions you have that may increase your stress or prevent you from lying still. What are the risks? Generally, this is a safe procedure. However, problems may occur, including:  Bleeding.  Infection.  Allergic reactions to medicines or dyes.  Damage to other structures or organs.  Kidney damage from the contrast dye that is used.  Increased risk of cancer from radiation exposure. This risk is low. Talk with your health care provider about: ? The risks and benefits of testing. ? How you can receive the lowest dose of radiation. What happens before the procedure?  Wear comfortable clothing and remove any jewelry, glasses, dentures, and hearing aids.  Follow instructions from your health care provider about eating and drinking. This may include: ? For 12 hours before the procedure -- avoid caffeine. This includes tea, coffee, soda,  energy drinks, and diet pills. Drink plenty of water or other fluids that do not have caffeine in them. Being well hydrated can prevent complications. ? For 4-6 hours before the procedure -- stop eating and drinking. The contrast dye can cause nausea, but this is less likely if your stomach is empty.  Ask your health care provider about changing or stopping your regular medicines. This is especially important if you are taking diabetes medicines, blood thinners, or medicines to treat problems with erections (erectile dysfunction). What happens during the procedure?   Hair on your chest may need to be removed so that small sticky patches called electrodes can be placed on your chest. These will transmit information that helps to monitor your heart during the procedure.  An IV will be inserted into one of your veins.  You might be given  a medicine to control your heart rate during the procedure. This will help to ensure that good images are obtained.  You will be asked to lie on an exam table. This table will slide in and out of the CT machine during the procedure.  Contrast dye will be injected into the IV. You might feel warm, or you may get a metallic taste in your mouth.  You will be given a medicine called nitroglycerin. This will relax or dilate the arteries in your heart.  The table that you are lying on will move into the CT machine tunnel for the scan.  The person running the machine will give you instructions while the scans are being done. You may be asked to: ? Keep your arms above your head. ? Hold your breath. ? Stay very still, even if the table is moving.  When the scanning is complete, you will be moved out of the machine.  The IV will be removed. The procedure may vary among health care providers and hospitals. What can I expect after the procedure? After your procedure, it is common to have:  A metallic taste in your mouth from the contrast dye.  A feeling of warmth.  A headache from the nitroglycerin. Follow these instructions at home:  Take over-the-counter and prescription medicines only as told by your health care provider.  If you are told, drink enough fluid to keep your urine pale yellow. This will help to flush the contrast dye out of your body.  Most people can return to their normal activities right after the procedure. Ask your health care provider what activities are safe for you.  It is up to you to get the results of your procedure. Ask your health care provider, or the department that is doing the procedure, when your results will be ready.  Keep all follow-up visits as told by your health care provider. This is important. Contact a health care provider if:  You have any symptoms of allergy to the contrast dye. These include: ? Shortness of breath. ? Rash or hives. ? A  racing heartbeat. Summary  A cardiac CT angiogram is a procedure to look at the heart and the area around the heart. It may be done to help find the cause of chest pains or other symptoms of heart disease.  During this procedure, a large X-ray machine, called a CT scanner, takes detailed pictures of the heart and the surrounding area after a contrast dye has been injected into blood vessels in the area.  Ask your health care provider about changing or stopping your regular medicines before the procedure. This is especially  important if you are taking diabetes medicines, blood thinners, or medicines to treat erectile dysfunction.  If you are told, drink enough fluid to keep your urine pale yellow. This will help to flush the contrast dye out of your body. This information is not intended to replace advice given to you by your health care provider. Make sure you discuss any questions you have with your health care provider. Document Revised: 02/24/2019 Document Reviewed: 02/24/2019 Elsevier Patient Education  [place].

## 2019-10-21 NOTE — Progress Notes (Signed)
New Outpatient Visit Date: 10/21/2019  Referring Provider: Crecencio Mc, MD [address] [place],  [place] [address]  Chief Complaint: Palpitations and chest tightness  HPI:  Ms. Lingg is a 59 y.o. female who is being seen today as a self-referral for evaluation of palpitations and chest tightness. She has a history of hypertension, hyperlipidemia, irritable bowel syndrome, diverticulitis, endometriosis, depression, anxiety, and GERD.  Ms. Nissen reports a history of intermittent palpitations and chest tightness for several years.  The symptoms are most pronounced when she is anxious.  However, she also notes exertional dyspnea when she climbs a flight of stairs.  She is concerned about potential underlying coronary artery disease, as her son, who is also a patient of mine, was recently diagnosed with non-obstructive coronary artery disease at a young age.  Ms. Gensch denies a history of prior heart testing.  She has been prescribed propranolol to be used on an as needed basis for palpitation associated with anxiety.  She reports that her heart rate will sometimes jump up to 120 bpm while at rest, as indicated by her Apple Watch.  She is typically unaware of this.  She has been trying to lose weight with the assistance of phentermine and other diet pills.  She also consumes several caffeinated beverages a day.  Ms. Vaught notes chronic intermittent leg edema, worse on the left.  She has not had any orthopnea or PND.  She continues to smoke intermittently and worries that she will gain weight if she tries to stop completely.  --------------------------------------------------------------------------------------------------  Cardiovascular History & Procedures: Cardiovascular Problems:  Chest pain  Dyspnea on exertion  Palpitations  Risk Factors:  Hypertension, hyperlipidemia, obesity, tobacco use, and family history  Cath/PCI:  None  CV Surgery:  None  EP Procedures  and Devices:  None  Non-Invasive Evaluation(s):  None  Recent CV Pertinent Labs: Lab Results  Component Value Date   CHOL 223 (H) 08/31/2014   HDL 46.60 08/31/2014   LDLCALC 155 (H) 08/31/2014   TRIG 108.0 08/31/2014   CHOLHDL 5 08/31/2014   K 4.2 08/12/2018   MG 2.1 08/12/2018   BUN 23 08/12/2018   CREATININE 0.87 08/12/2018   CREATININE 0.85 10/14/2016    --------------------------------------------------------------------------------------------------  Past Medical History:  Diagnosis Date  . allergic rhinitis   . Anxiety   . AR (allergic rhinitis)   . Asthma   . Depression   . Endometriosis   . GERD (gastroesophageal reflux disease)   . H/O migraine   . Herniated intervertebral disc   . History of diverticulitis of colon   . Hyperlipidemia   . Hypertension   . IBS (irritable bowel syndrome)   . Irritable bowel syndrome   . Psoriasis     Past Surgical History:  Procedure Laterality Date  . ABDOMINAL HYSTERECTOMY  2003   tvhuso  . BREAST BIOPSY Left 08/2014   benign  . DILATION AND CURETTAGE OF UTERUS      Current Meds  Medication Sig  . albuterol (VENTOLIN HFA) 108 (90 Base) MCG/ACT inhaler TAKE 2 PUFFS BY MOUTH EVERY 6 HOURS AS NEEDED FOR WHEEZE OR SHORTNESS OF BREATH  . betamethasone dipropionate 0.05 % cream Apply topically 2 (two) times daily.  [name] buPROPion (WELLBUTRIN XL) 150 MG 24 hr tablet TAKE ONE (1) TABLET BY MOUTH EVERY DAY  . busPIRone (BUSPAR) 15 MG tablet TAKE 1 TABLET (15 MG TOTAL) BY MOUTH 2 (TWO) TIMES DAILY AS NEEDED.  [name] cetirizine (ZYRTEC) 10 MG tablet  Take 10 mg by mouth daily. Reported on 09/12/2015  . esomeprazole (NEXIUM) 40 MG capsule TAKE 1 CAPSULE BY MOUTH EVERY DAY BEFORE BREAKFAST  . fluocinonide-emollient (LIDEX-E) 0.05 % cream Apply 1 application topically 2 (two) times daily.  . fluticasone (FLONASE) 50 MCG/ACT nasal spray USE 2 SPRAYS EACH NOSTRIL DAILY  . hyoscyamine (LEVSIN SL) 0.125 MG SL tablet PLACE 1 TABLET UNDER THE  TONGUE EVERY 4 HOURS AS NEEDED  . phentermine (ADIPEX-P) 37.5 MG tablet Take 37.5 mg by mouth every morning.  . promethazine (PHENERGAN) 12.5 MG tablet TAKE ONE TABLET BY MOUTH EVERY 6 HOURS AS NEEDED FOR NAUSEA AND VOMITING.  . propranolol (INDERAL) 10 MG tablet TAKE 1 TABLET (10 MG TOTAL) BY MOUTH 3 (THREE) TIMES DAILY. AS NEEDED FOR RAPID HEART RATE  . triamcinolone cream (KENALOG) 0.1 % Apply 1 application topically 2 (two) times daily.    Allergies: Compazine [prochlorperazine]  Social History   Tobacco Use  . Smoking status: Current Every Day Smoker    Packs/day: 0.25    Years: 20.00    Pack years: 5.00    Types: Cigarettes  . Smokeless tobacco: Never Used  . Tobacco comment: 2-3 cigarettes daily   Substance Use Topics  . Alcohol use: Yes    Alcohol/week: 0.0 standard drinks    Comment: occas  . Drug use: No    Family History  Problem Relation Age of Onset  . Arthritis Mother   . Hyperlipidemia Mother   . Cancer Father        colon  . Arthritis Maternal Grandmother   . Hyperlipidemia Maternal Grandfather   . Diabetes Brother   . Heart disease Neg Hx   . Breast cancer Neg Hx   . Colon cancer Neg Hx     Review of Systems: A 12-system review of systems was performed and was negative except as noted in the HPI.  --------------------------------------------------------------------------------------------------  Physical Exam: BP 100/80 (BP Location: Right Arm, Patient Position: Sitting, Cuff Size: Normal)   Pulse 84   Ht 5\' 7"  (1.702 m)   Wt 201 lb 4 oz (91.3 kg)   SpO2 98%   BMI 31.52 kg/m   General:  NAD.  Accompanied by her son. HEENT: No conjunctival pallor or scleral icterus. Facemask in place. Neck: Supple without lymphadenopathy, thyromegaly, JVD, or HJR. No carotid bruit. Lungs: Normal work of breathing. Clear to auscultation bilaterally without wheezes or crackles. Heart: Regular rate and rhythm without murmurs, rubs, or gallops. Non-displaced  PMI. Abd: Bowel sounds present. Soft, NT/ND without hepatosplenomegaly Ext: No lower extremity edema. Radial, PT, and DP pulses are 2+ bilaterally Skin: Warm and dry without rash. Neuro: CNIII-XII intact. Strength and fine-touch sensation intact in upper and lower extremities bilaterally. Psych: Normal mood and affect.  EKG:  Normal sinus rhythm with PAC's.  Otherwise, no significant abnormality.  Lab Results  Component Value Date   WBC 6.6 12/26/2016   HGB 13.7 12/26/2016   HCT 39.7 12/26/2016   MCV 86.1 12/26/2016   PLT 165 12/26/2016    Lab Results  Component Value Date   NA 136 08/12/2018   K 4.2 08/12/2018   CL 104 08/12/2018   CO2 24 08/12/2018   BUN 23 08/12/2018   CREATININE 0.87 08/12/2018   GLUCOSE 80 08/12/2018   ALT 19 08/12/2018    Lab Results  Component Value Date   CHOL 223 (H) 08/31/2014   HDL 46.60 08/31/2014   LDLCALC 155 (H) 08/31/2014   TRIG 108.0 08/31/2014  CHOLHDL 5 08/31/2014    --------------------------------------------------------------------------------------------------  ASSESSMENT AND PLAN: Chest pain and dyspnea on exertion: Symptoms are non-specific and have been present for several years.  Examination and EKG today are unrevealing.  However, give multiple cardiac risk factors and ongoing symptoms, we have agreed to proceed with cardiac CTA.  In the meantime, I have advised Ms. Cherek to work on quitting smoking.  We will defer echocardiogram, given unremarkable EKG and lack of exam findings to suggest heart failure or significant valvular heart disease.  Palpitations and PAC's: Palpitations seem to be related to anxiety, for which Ms. Perrotti has been prescribed propranolol in the past.  I think it is reasonable to continue with as needed propranolol.  I have encouraged her to minimize her caffeine consumption as well as to avoid diet pills.  Hypertension: Blood pressure is low normal today.  No medication changes to be made  today.  Tobacco use: I encouraged Ms. Mixell to continue to work on quitting smoking.  Follow-up: Return to clinic in 8 weeks.  Nelva Bush, MD 10/22/2019 8:52 AM

## 2019-10-22 ENCOUNTER — Encounter: Payer: Self-pay | Admitting: Internal Medicine

## 2019-10-22 DIAGNOSIS — R002 Palpitations: Secondary | ICD-10-CM | POA: Insufficient documentation

## 2019-10-22 DIAGNOSIS — R0609 Other forms of dyspnea: Secondary | ICD-10-CM

## 2019-10-22 DIAGNOSIS — R079 Chest pain, unspecified: Secondary | ICD-10-CM | POA: Insufficient documentation

## 2019-10-22 DIAGNOSIS — I491 Atrial premature depolarization: Secondary | ICD-10-CM

## 2019-10-22 DIAGNOSIS — R0600 Dyspnea, unspecified: Secondary | ICD-10-CM | POA: Insufficient documentation

## 2019-10-22 HISTORY — DX: Other forms of dyspnea: R06.09

## 2019-10-22 HISTORY — DX: Atrial premature depolarization: I49.1

## 2019-10-25 DIAGNOSIS — M9901 Segmental and somatic dysfunction of cervical region: Secondary | ICD-10-CM | POA: Diagnosis not present

## 2019-10-25 DIAGNOSIS — M62830 Muscle spasm of back: Secondary | ICD-10-CM | POA: Diagnosis not present

## 2019-10-25 DIAGNOSIS — M5033 Other cervical disc degeneration, cervicothoracic region: Secondary | ICD-10-CM | POA: Diagnosis not present

## 2019-10-25 DIAGNOSIS — M9903 Segmental and somatic dysfunction of lumbar region: Secondary | ICD-10-CM | POA: Diagnosis not present

## 2019-11-01 DIAGNOSIS — M62830 Muscle spasm of back: Secondary | ICD-10-CM | POA: Diagnosis not present

## 2019-11-01 DIAGNOSIS — M5033 Other cervical disc degeneration, cervicothoracic region: Secondary | ICD-10-CM | POA: Diagnosis not present

## 2019-11-01 DIAGNOSIS — M9901 Segmental and somatic dysfunction of cervical region: Secondary | ICD-10-CM | POA: Diagnosis not present

## 2019-11-01 DIAGNOSIS — M9903 Segmental and somatic dysfunction of lumbar region: Secondary | ICD-10-CM | POA: Diagnosis not present

## 2019-11-10 ENCOUNTER — Ambulatory Visit: Payer: Self-pay | Admitting: Dermatology

## 2019-11-15 ENCOUNTER — Other Ambulatory Visit: Payer: Self-pay

## 2019-11-15 ENCOUNTER — Ambulatory Visit: Payer: BC Managed Care – PPO | Admitting: Dermatology

## 2019-11-15 DIAGNOSIS — L409 Psoriasis, unspecified: Secondary | ICD-10-CM

## 2019-11-15 MED ORDER — ENSTILAR 0.005-0.064 % EX FOAM: CUTANEOUS | 3 refills | Status: DC

## 2019-11-15 NOTE — Progress Notes (Signed)
   Follow-Up Visit   Subjective  Colleen Washington is a 59 y.o. female who presents for the following: Psoriasis (palms and soles). Patient is using betamethasone and calcipotriene twice a day without much improvement. She feels like it is worse on her feet.  She has been experiencing some pain in her ankles with difficulty walking as well as pain in fingers with stiffness and associated swelling. She has a h/o IBS, and diverticulitis, but denies any Crohns/UC.  She has a tendency to get yeast infections when on antibiotics, which clears up with Diflucan.  The following portions of the chart were reviewed this encounter and updated as appropriate:      Review of Systems:  No other skin or systemic complaints except as noted in HPI or Assessment and Plan.  Objective  Well appearing patient in no apparent distress; mood and affect are within normal limits.  A focused examination was performed including hands, feet. Relevant physical exam findings are noted in the Assessment and Plan.  Objective  Palms, soles, elbows: Erythematous scaly patches, some well-demarcated on feet with hyperkeratosis BSA 6%   Assessment & Plan  Psoriasis Palms, soles, elbows  With joint pain Discussed Otezla vs biologic. Don't recommend [place] Nail due to patient having IBS. Patient will start Cosentyx pending labs and insurance approval.  Information given.  Discussed risks/benefits.  Will monitor for yeast infections  Start Enstilar Foam Apply to AAs QD/BID dsp 60g 3Rf. D/C topical calcipotriene ointment and Betamethasone diprop ointment  Calcipotriene-Betameth Diprop (ENSTILAR) 0.005-0.064 % FOAM - Palms, soles, elbows  Other Related Procedures Comprehensive metabolic panel CBC with Differential/Platelet Hepatitis B surface antigen Hepatitis C antibody HIV Antibody (routine testing w rflx) QuantiFERON-TB Gold Plus Hepatitis B surface antibody,qualitative Hepatitis B surface antibody,quantitative  Return  in about 4 weeks (around 12/13/2019) for psoriasis, to start Cosentyx.   IJamesetta Orleans, CMA, am acting as scribe for Brendolyn Patty, MD .    Documentation: I have reviewed the above documentation for accuracy and completeness, and I agree with the above.  Brendolyn Patty, MD

## 2019-11-16 DIAGNOSIS — M9901 Segmental and somatic dysfunction of cervical region: Secondary | ICD-10-CM | POA: Diagnosis not present

## 2019-11-16 DIAGNOSIS — M9903 Segmental and somatic dysfunction of lumbar region: Secondary | ICD-10-CM | POA: Diagnosis not present

## 2019-11-16 DIAGNOSIS — M62830 Muscle spasm of back: Secondary | ICD-10-CM | POA: Diagnosis not present

## 2019-11-16 DIAGNOSIS — M5033 Other cervical disc degeneration, cervicothoracic region: Secondary | ICD-10-CM | POA: Diagnosis not present

## 2019-11-19 ENCOUNTER — Other Ambulatory Visit
Admission: RE | Admit: 2019-11-19 | Discharge: 2019-11-19 | Disposition: A | Discharge status: Home / Self Care | Payer: BC Managed Care – PPO | Source: Ambulatory Visit | Attending: Internal Medicine | Admitting: Internal Medicine

## 2019-11-19 ENCOUNTER — Other Ambulatory Visit
Admission: RE | Admit: 2019-11-19 | Discharge: 2019-11-19 | Disposition: A | Discharge status: Home / Self Care | Payer: BC Managed Care – PPO | Source: Ambulatory Visit | Attending: Dermatology | Admitting: Dermatology

## 2019-11-19 DIAGNOSIS — R079 Chest pain, unspecified: Secondary | ICD-10-CM | POA: Diagnosis not present

## 2019-11-19 DIAGNOSIS — R0609 Other forms of dyspnea: Secondary | ICD-10-CM

## 2019-11-19 DIAGNOSIS — R0600 Dyspnea, unspecified: Secondary | ICD-10-CM | POA: Insufficient documentation

## 2019-11-19 LAB — CBC WITH DIFFERENTIAL/PLATELET
Abs Immature Granulocytes: 0.01 10*3/uL (ref 0.00–0.07)
Basophils Absolute: 0 10*3/uL (ref 0.0–0.1)
Basophils Relative: 1 %
Eosinophils Absolute: 0.4 10*3/uL (ref 0.0–0.5)
Eosinophils Relative: 7 %
HCT: 41 % (ref 36.0–46.0)
Hemoglobin: 13.5 g/dL (ref 12.0–15.0)
Immature Granulocytes: 0 %
Lymphocytes Relative: 39 %
Lymphs Abs: 1.8 10*3/uL (ref 0.7–4.0)
MCH: 29.3 pg (ref 26.0–34.0)
MCHC: 32.9 g/dL (ref 30.0–36.0)
MCV: 88.9 fL (ref 80.0–100.0)
Monocytes Absolute: 0.4 10*3/uL (ref 0.1–1.0)
Monocytes Relative: 9 %
Neutro Abs: 2.1 10*3/uL (ref 1.7–7.7)
Neutrophils Relative %: 44 %
Platelets: 195 10*3/uL (ref 150–400)
RBC: 4.61 MIL/uL (ref 3.87–5.11)
RDW: 13 % (ref 11.5–15.5)
WBC: 4.7 10*3/uL (ref 4.0–10.5)
nRBC: 0 % (ref 0.0–0.2)

## 2019-11-19 LAB — COMPREHENSIVE METABOLIC PANEL
ALT: 28 U/L (ref 0–44)
AST: 24 U/L (ref 15–41)
Albumin: 4.4 g/dL (ref 3.5–5.0)
Alkaline Phosphatase: 78 U/L (ref 38–126)
Anion gap: 6 (ref 5–15)
BUN: 10 mg/dL (ref 6–20)
CO2: 26 mmol/L (ref 22–32)
Calcium: 9.2 mg/dL (ref 8.9–10.3)
Chloride: 108 mmol/L (ref 98–111)
Creatinine, Ser: 0.92 mg/dL (ref 0.44–1.00)
GFR calc Af Amer: >60 mL/min (ref >60)
GFR calc non Af Amer: >60 mL/min (ref >60)
Glucose, Bld: 106 mg/dL — ABNORMAL HIGH (ref 70–99)
Potassium: 4.1 mmol/L (ref 3.5–5.1)
Sodium: 140 mmol/L (ref 135–145)
Total Bilirubin: 0.8 mg/dL (ref 0.3–1.2)
Total Protein: 7.8 g/dL (ref 6.5–8.1)

## 2019-11-19 LAB — HEPATITIS C ANTIBODY: HCV Ab: NONREACTIVE

## 2019-11-19 LAB — HEPATITIS B SURFACE ANTIGEN: Hepatitis B Surface Ag: NONREACTIVE

## 2019-11-19 LAB — HIV ANTIBODY (ROUTINE TESTING W REFLEX): HIV Screen 4th Generation wRfx: NONREACTIVE

## 2019-11-20 LAB — MISC LABCORP TEST (SEND OUT): Labcorp test code: 6395

## 2019-11-20 LAB — HEPATITIS B SURFACE ANTIBODY, QUANTITATIVE: Hep B S AB Quant (Post): 4.5 m[IU]/mL — ABNORMAL LOW (ref >9.9)

## 2019-11-22 ENCOUNTER — Telehealth: Payer: Self-pay

## 2019-11-22 ENCOUNTER — Telehealth: Payer: Self-pay | Admitting: Internal Medicine

## 2019-11-22 LAB — QUANTIFERON-TB GOLD PLUS (RQFGPL)
QuantiFERON Mitogen Value: >10 IU/mL
QuantiFERON Nil Value: 0 IU/mL
QuantiFERON TB1 Ag Value: 0.18 IU/mL
QuantiFERON TB2 Ag Value: 0.06 IU/mL

## 2019-11-22 LAB — QUANTIFERON-TB GOLD PLUS: QuantiFERON-TB Gold Plus: NEGATIVE

## 2019-11-22 NOTE — Telephone Encounter (Signed)
-----   Message from Brendolyn Patty, MD sent at 11/22/2019  8:49 AM EDT ----- Pre biologic baseline labs ok.  Hep B/C panel neg.  No TB or HIV screening test.

## 2019-11-22 NOTE — Telephone Encounter (Signed)
Advised pt of labs.  Quantiferon TB Gold test is not completed and the [facility] said it should be ready late this afternoon or tomorrow and we will get results then./sh

## 2019-11-22 NOTE — Telephone Encounter (Signed)
Patient calling back after lab work. Patient asking about cholesterol, states she was not able to see it on mychart  Please advise

## 2019-11-22 NOTE — Telephone Encounter (Signed)
Patient thought when she had her recent lab work a cholesterol panel was going to be drawn at that time. Advised that there was not a Lipid panel drawn and the last order for Lipid was from Dr Derrel Nip 01/12/2019. Advised her to contact PCP to complete the ordered lab work. 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

## 2019-11-23 ENCOUNTER — Telehealth: Payer: Self-pay

## 2019-11-23 ENCOUNTER — Other Ambulatory Visit: Payer: Self-pay

## 2019-11-23 ENCOUNTER — Telehealth (HOSPITAL_COMMUNITY): Payer: Self-pay | Admitting: Emergency Medicine

## 2019-11-23 DIAGNOSIS — L400 Psoriasis vulgaris: Secondary | ICD-10-CM

## 2019-11-23 MED ORDER — COSENTYX SENSOREADY (300 MG) 150 MG/ML ~~LOC~~ SOAJ: 300.0000 mg | SUBCUTANEOUS | 5 refills | Status: DC

## 2019-11-23 MED ORDER — COSENTYX SENSOREADY (300 MG) 150 MG/ML ~~LOC~~ SOAJ: 300.0000 mg | SUBCUTANEOUS | 0 refills | Status: DC

## 2019-11-23 NOTE — Telephone Encounter (Signed)
-----   Message from Brendolyn Patty, MD sent at 11/22/2019  5:45 PM EDT ----- TB neg

## 2019-11-23 NOTE — Telephone Encounter (Signed)
Lft msg for pt to call for lab results/sh

## 2019-11-23 NOTE — Progress Notes (Signed)
Cosentyx sent in today per labs.

## 2019-11-23 NOTE — Telephone Encounter (Signed)
Attempted to call patient regarding upcoming cardiac CT appointment. °Left message on voicemail with name and callback number °Keylen Eckenrode RN Navigator Cardiac Imaging °[facility] °[phone] Office °[phone] Cell ° °

## 2019-11-24 ENCOUNTER — Telehealth: Payer: Self-pay

## 2019-11-24 ENCOUNTER — Telehealth (HOSPITAL_COMMUNITY): Payer: Self-pay | Admitting: Emergency Medicine

## 2019-11-24 NOTE — Telephone Encounter (Signed)
Attempted to call patient regarding upcoming cardiac CT appointment. °Left message on voicemail with name and callback number °Uvaldo Rybacki RN Navigator Cardiac Imaging °[facility] °[phone] Office °[phone] Cell ° °

## 2019-11-24 NOTE — Telephone Encounter (Signed)
Discussed lab results with pt  

## 2019-11-24 NOTE — Telephone Encounter (Signed)
Pt returning phone call regarding upcoming cardiac imaging study; pt verbalizes understanding of appt date/time, parking situation and where to check in, pre-test NPO status and medications ordered, and verified current allergies; name and call back number provided for further questions should they arise Colleen Bond RN Navigator Cardiac Imaging [facility] [phone] office [phone] cell   Pt instructed to take 50mg  metoprolol 2 hr prior to scan and avoid allergy meds. Pt verbalized understanding. Colleen Washington

## 2019-11-25 ENCOUNTER — Telehealth: Payer: Self-pay | Admitting: *Deleted

## 2019-11-25 ENCOUNTER — Other Ambulatory Visit: Payer: Self-pay | Admitting: Internal Medicine

## 2019-11-25 ENCOUNTER — Ambulatory Visit
Admission: RE | Admit: 2019-11-25 | Discharge: 2019-11-25 | Disposition: A | Discharge status: Home / Self Care | Payer: BC Managed Care – PPO | Source: Ambulatory Visit | Attending: Internal Medicine | Admitting: Internal Medicine

## 2019-11-25 ENCOUNTER — Other Ambulatory Visit: Payer: Self-pay

## 2019-11-25 DIAGNOSIS — N6320 Unspecified lump in the left breast, unspecified quadrant: Secondary | ICD-10-CM

## 2019-11-25 DIAGNOSIS — R079 Chest pain, unspecified: Secondary | ICD-10-CM | POA: Diagnosis not present

## 2019-11-25 DIAGNOSIS — R0609 Other forms of dyspnea: Secondary | ICD-10-CM

## 2019-11-25 DIAGNOSIS — R0600 Dyspnea, unspecified: Secondary | ICD-10-CM | POA: Insufficient documentation

## 2019-11-25 DIAGNOSIS — Z13220 Encounter for screening for lipoid disorders: Secondary | ICD-10-CM

## 2019-11-25 MED ORDER — METOPROLOL TARTRATE 5 MG/5ML IV SOLN
5.0000 mg | Freq: Once | INTRAVENOUS | Status: AC
  Administered 2019-11-25: 5 mg via INTRAVENOUS

## 2019-11-25 MED ORDER — NITROGLYCERIN 0.4 MG SL SUBL
0.8000 mg | SUBLINGUAL_TABLET | Freq: Once | SUBLINGUAL | Status: AC
  Administered 2019-11-25: 0.8 mg via SUBLINGUAL

## 2019-11-25 MED ORDER — IOHEXOL 350 MG/ML SOLN
100.0000 mL | Freq: Once | INTRAVENOUS | Status: AC | PRN
  Administered 2019-11-25: 100 mL via INTRAVENOUS

## 2019-11-25 NOTE — Telephone Encounter (Signed)
Results released to My Chart. Results called to pt. Pt verbalized understanding of results and recommendations. She has already sent a message to Dr Derrel Nip requesting a mammogram. She will f/u with PCP and let us know if they are not able to order the mammogram. She is aware to go to the [facility] at her earliest convenience for lab work. She knows to be fasting.

## 2019-11-25 NOTE — Telephone Encounter (Signed)
-----   Message from Nelva Bush, MD sent at 11/25/2019  3:37 PM EDT ----- Please let Colleen Washington know that her CT scan shows mild to moderate plaque buildup in her coronary arteries without a severe blockage.  Incidental note was made of tiny lung nodules as well as some asymmetry in her breast tissue that should be further evaluated.  I recommend that we obtain a fasting lipid panel at Colleen Washington's earliest convenience and also arrange for diagnostic mammogram for further assessment of her breast asymmetry.  She should follow-up as planned in the office next month.  I will forward these results to Dr. Derrel Nip for her review as well.

## 2019-11-25 NOTE — Telephone Encounter (Signed)
[facility] Radiology technologist calling with Addendum CT scoring impression: IMPRESSION: 1. Left breast 2.5 cm soft tissue asymmetry. Diagnostic mammographic evaluation at a women's imaging center recommended.   Wanted the order provider to be aware. Routing to Dr End for review.

## 2019-11-25 NOTE — Telephone Encounter (Signed)
Addressed in result note. Closing encounter.

## 2019-11-26 ENCOUNTER — Other Ambulatory Visit: Payer: Self-pay | Admitting: Internal Medicine

## 2019-11-26 DIAGNOSIS — N6320 Unspecified lump in the left breast, unspecified quadrant: Secondary | ICD-10-CM

## 2019-11-29 DIAGNOSIS — M9901 Segmental and somatic dysfunction of cervical region: Secondary | ICD-10-CM | POA: Diagnosis not present

## 2019-11-29 DIAGNOSIS — M5033 Other cervical disc degeneration, cervicothoracic region: Secondary | ICD-10-CM | POA: Diagnosis not present

## 2019-11-29 DIAGNOSIS — M9903 Segmental and somatic dysfunction of lumbar region: Secondary | ICD-10-CM | POA: Diagnosis not present

## 2019-11-29 DIAGNOSIS — M62830 Muscle spasm of back: Secondary | ICD-10-CM | POA: Diagnosis not present

## 2019-12-02 ENCOUNTER — Ambulatory Visit
Admission: RE | Admit: 2019-12-02 | Discharge: 2019-12-02 | Disposition: A | Discharge status: Home / Self Care | Payer: BC Managed Care – PPO | Source: Ambulatory Visit | Attending: Internal Medicine | Admitting: Internal Medicine

## 2019-12-02 DIAGNOSIS — N6320 Unspecified lump in the left breast, unspecified quadrant: Secondary | ICD-10-CM | POA: Diagnosis not present

## 2019-12-02 DIAGNOSIS — R928 Other abnormal and inconclusive findings on diagnostic imaging of breast: Secondary | ICD-10-CM | POA: Diagnosis not present

## 2019-12-14 DIAGNOSIS — M9901 Segmental and somatic dysfunction of cervical region: Secondary | ICD-10-CM | POA: Diagnosis not present

## 2019-12-14 DIAGNOSIS — M9903 Segmental and somatic dysfunction of lumbar region: Secondary | ICD-10-CM | POA: Diagnosis not present

## 2019-12-14 DIAGNOSIS — M62830 Muscle spasm of back: Secondary | ICD-10-CM | POA: Diagnosis not present

## 2019-12-14 DIAGNOSIS — M5033 Other cervical disc degeneration, cervicothoracic region: Secondary | ICD-10-CM | POA: Diagnosis not present

## 2019-12-15 ENCOUNTER — Other Ambulatory Visit
Admission: RE | Admit: 2019-12-15 | Discharge: 2019-12-15 | Disposition: A | Discharge status: Home / Self Care | Payer: BC Managed Care – PPO | Source: Ambulatory Visit | Attending: Internal Medicine | Admitting: Internal Medicine

## 2019-12-15 DIAGNOSIS — Z13220 Encounter for screening for lipoid disorders: Secondary | ICD-10-CM | POA: Insufficient documentation

## 2019-12-15 LAB — LIPID PANEL
Cholesterol: 299 mg/dL — ABNORMAL HIGH (ref 0–200)
HDL: 49 mg/dL (ref >40)
LDL Cholesterol: 208 mg/dL — ABNORMAL HIGH (ref 0–99)
Total CHOL/HDL Ratio: 6.1 RATIO
Triglycerides: 209 mg/dL — ABNORMAL HIGH (ref <150)
VLDL: 42 mg/dL — ABNORMAL HIGH (ref 0–40)

## 2019-12-16 ENCOUNTER — Encounter: Payer: Self-pay | Admitting: Nurse Practitioner

## 2019-12-16 ENCOUNTER — Other Ambulatory Visit: Payer: Self-pay | Admitting: Internal Medicine

## 2019-12-16 ENCOUNTER — Other Ambulatory Visit: Payer: Self-pay

## 2019-12-16 ENCOUNTER — Ambulatory Visit (INDEPENDENT_AMBULATORY_CARE_PROVIDER_SITE_OTHER): Payer: BC Managed Care – PPO | Admitting: Nurse Practitioner

## 2019-12-16 VITALS — BP 104/86 | HR 104 | Ht 67.0 in | Wt 201.0 lb

## 2019-12-16 DIAGNOSIS — I2510 Atherosclerotic heart disease of native coronary artery without angina pectoris: Secondary | ICD-10-CM

## 2019-12-16 DIAGNOSIS — R079 Chest pain, unspecified: Secondary | ICD-10-CM | POA: Diagnosis not present

## 2019-12-16 DIAGNOSIS — E782 Mixed hyperlipidemia: Secondary | ICD-10-CM | POA: Diagnosis not present

## 2019-12-16 MED ORDER — ATORVASTATIN CALCIUM 40 MG PO TABS
40.0000 mg | ORAL_TABLET | Freq: Every day | ORAL | 3 refills | Status: DC
Start: 2019-12-16 — End: 2020-12-29

## 2019-12-16 NOTE — Progress Notes (Signed)
Office Visit    Patient Name: Colleen Washington Date of Encounter: 12/16/2019  Primary Care Provider:  Crecencio Mc, MD Primary Cardiologist:  Nelva Bush, MD  Chief Complaint    59 y/o ? with a history of hypertension, hyperlipidemia, irritable bowel syndrome, diverticulitis, endometriosis, depression, anxiety, and GERD, who presents for follow-up of chest pain and recent coronary CT angiography.  Past Medical History    Past Medical History:  Diagnosis Date  . allergic rhinitis   . Anxiety   . AR (allergic rhinitis)   . Asthma   . Depression   . Endometriosis   . GERD (gastroesophageal reflux disease)   . H/O migraine   . Herniated intervertebral disc   . History of diverticulitis of colon   . Hyperlipidemia   . Hypertension   . IBS (irritable bowel syndrome)   . Irritable bowel syndrome   . Nonobstructive CAD (coronary artery disease)    a. 11/2019 Cor CTA: Cor Ca2+ = 141 (95th %'ile). LAD 25-49p.  . Psoriasis   . Tobacco abuse    Past Surgical History:  Procedure Laterality Date  . ABDOMINAL HYSTERECTOMY  2003   tvhuso  . BREAST BIOPSY Left 08/2014   benign-BENIGN BREAST TISSUE WITH FIBROSIS  . DILATION AND CURETTAGE OF UTERUS      Allergies  Allergies  Allergen Reactions  . Compazine [Prochlorperazine] Swelling    History of Present Illness    59 year old female with the above past medical history including hypertension, hyperlipidemia, irritable bowel syndrome, diverticulitis, endometriosis, depression, anxiety, and GERD.  She was recently evaluated by Dr. Saunders Revel in the setting of intermittent palpitations, chest tightness, and exertional dyspnea.  She noted palpitations and elevated heart rates predominantly occurring with periods of anxiety and uses propranolol on an as-needed basis for this.  Decision was made to pursue coronary CT angiography which was performed in May and showed a 25-49% proximal LAD stenosis with a coronary calcium score of 141,  placing her in the 95th percentile for age/sex.  Since her last visit, she says she has not noticed any chest pain.  She denies palpitations but is aware that her heart rate runs up when she is feeling stressed.  Heart rate in clinic today is 104 and she says that she had to rush here from work and has had work stress related to being short staffed.  She continues to smoke less than a pack a day.  She expressed an interest to increase activity and lose weight and is also interested in statin therapy.  She denies dyspnea, PND, orthopnea, dizziness, syncope, edema, or early satiety.  Home Medications    Prior to Admission medications   Medication Sig Start Date End Date Taking? Authorizing Provider  albuterol (VENTOLIN HFA) 108 (90 Base) MCG/ACT inhaler TAKE 2 PUFFS BY MOUTH EVERY 6 HOURS AS NEEDED FOR WHEEZE OR SHORTNESS OF BREATH 09/20/19  Yes Crecencio Mc, MD  buPROPion (WELLBUTRIN XL) 150 MG 24 hr tablet TAKE ONE (1) TABLET BY MOUTH EVERY DAY 05/27/19  Yes Crecencio Mc, MD  busPIRone (BUSPAR) 15 MG tablet TAKE 1 TABLET (15 MG TOTAL) BY MOUTH 2 (TWO) TIMES DAILY AS NEEDED. 03/05/19  Yes Crecencio Mc, MD  Calcipotriene-Betameth Diprop (ENSTILAR) 0.005-0.064 % FOAM Apply to affected areas palms and soles 1-2 times daily until improved. Avoid face, groin, underarms. 11/15/19  Yes Brendolyn Patty, MD  cetirizine (ZYRTEC) 10 MG tablet Take 10 mg by mouth daily. Reported on 09/12/2015  Yes [provider]  esomeprazole (NEXIUM) 40 MG capsule TAKE 1 CAPSULE BY MOUTH EVERY DAY BEFORE BREAKFAST 10/21/19  Yes Crecencio Mc, MD  fluticasone (FLONASE) 50 MCG/ACT nasal spray USE 2 SPRAYS EACH NOSTRIL DAILY 06/10/17  Yes Crecencio Mc, MD  hyoscyamine (LEVSIN SL) 0.125 MG SL tablet PLACE 1 TABLET UNDER THE TONGUE EVERY 4 HOURS AS NEEDED 01/23/16  Yes Crecencio Mc, MD  promethazine (PHENERGAN) 12.5 MG tablet TAKE ONE TABLET BY MOUTH EVERY 6 HOURS AS NEEDED FOR NAUSEA AND VOMITING. 06/23/19  Yes Crecencio Mc, MD  propranolol (INDERAL) 10 MG tablet TAKE 1 TABLET (10 MG TOTAL) BY MOUTH 3 (THREE) TIMES DAILY. AS NEEDED FOR RAPID HEART RATE 06/23/19  Yes Crecencio Mc, MD  Secukinumab, 300 MG Dose, (COSENTYX SENSOREADY, 300 MG,) 150 MG/ML SOAJ Inject 300 mg into the skin as directed. On week 0, 1, 2, 3 and 4. 11/23/19  Yes Brendolyn Patty, MD  Secukinumab, 300 MG Dose, (COSENTYX SENSOREADY, 300 MG,) 150 MG/ML SOAJ Inject 300 mg into the skin every 28 (twenty-eight) days. For maintenance. 11/23/19  Yes Brendolyn Patty, MD  triamcinolone cream (KENALOG) 0.1 % Apply 1 application topically 2 (two) times daily. 06/17/19  Yes Wurst, [place], PA-C  betamethasone dipropionate 0.05 % cream Apply topically 2 (two) times daily. Patient not taking: Reported on 12/16/2019 09/04/19   Crecencio Mc, MD  fluocinonide-emollient (LIDEX-E) 0.05 % cream Apply 1 application topically 2 (two) times daily. Patient not taking: Reported on 12/16/2019 06/17/19   Wurst, [place], PA-C  phentermine (ADIPEX-P) 37.5 MG tablet Take 37.5 mg by mouth every morning. 10/12/18   [provider]    Review of Systems    She sometimes notes elevated heart rates but denies palpitations.  She denies chest pain, dyspnea, pnd, orthopnea, n, v, dizziness, syncope, edema, weight gain, or early satiety.  All other systems reviewed and are otherwise negative except as noted above.  Physical Exam    VS:  BP 104/86 (BP Location: Left Arm, Patient Position: Sitting, Cuff Size: Normal)   Pulse (!) 104   Ht 5\' 7"  (1.702 m)   Wt 201 lb (91.2 kg)   SpO2 98%   BMI 31.48 kg/m  , BMI Body mass index is 31.48 kg/m. GEN: Well nourished, well developed, in no acute distress. HEENT: normal. Neck: Supple, no JVD, carotid bruits, or masses. Cardiac: RRR, no murmurs, rubs, or gallops. No clubbing, cyanosis, edema.  Radials/PT 2+ and equal bilaterally.  Respiratory:  Respirations regular and unlabored, clear to auscultation bilaterally. GI: Soft,  nontender, nondistended, BS + x 4. MS: no deformity or atrophy. Skin: warm and dry, no rash. Neuro:  Strength and sensation are intact. Psych: Normal affect.  Accessory Clinical Findings    ECG personally reviewed by me today -sinus tachycardia, 104, delayed R wave progression- no acute changes.  Lab Results  Component Value Date   WBC 4.7 11/19/2019   HGB 13.5 11/19/2019   HCT 41.0 11/19/2019   MCV 88.9 11/19/2019   PLT 195 11/19/2019   Lab Results  Component Value Date   CREATININE 0.92 11/19/2019   BUN 10 11/19/2019   NA 140 11/19/2019   K 4.1 11/19/2019   CL 108 11/19/2019   CO2 26 11/19/2019   Lab Results  Component Value Date   ALT 28 11/19/2019   AST 24 11/19/2019   ALKPHOS 78 11/19/2019   BILITOT 0.8 11/19/2019   Lab Results  Component Value Date   CHOL  299 (H) 12/15/2019   HDL 49 12/15/2019   LDLCALC 208 (H) 12/15/2019   TRIG 209 (H) 12/15/2019   CHOLHDL 6.1 12/15/2019    Lab Results  Component Value Date   HGBA1C 5.2 10/14/2016    Assessment & Plan    1.  Chest pain/nonobstructive coronary artery disease: Status post recent coronary CT angiogram which showed a 25-49% proximal LAD stenosis with a coronary calcium score of 141, placing her in the 95th percentile for age/sex.  She has not been having any chest pain or dyspnea.  We discussed the utilization of statin therapy in the setting of CT findings and a total cholesterol of 299 with an LDL of 208.  She is eager to initiate statin therapy and was previously on atorvastatin, which she tolerated well.  I will resume atorvastatin 40 mg daily and plan for follow-up lipids and LFTs in approximately 6 weeks.  We discussed the role of diet and exercise in weight and cholesterol management.  She is interested in [employer] and plans to pursue it or something like it.  She recognizes that she needs to increase her activity to at least 30 minutes daily and does have access to a treadmill both at work and at  home.  2.  Hyperlipidemia: Total cholesterol 299 with an LDL of 208.  As outlined above, in the setting of nonobstructive CAD noted on coronary CT, I will add atorvastatin 40 mg daily and follow-up lipids and LFTs in approximately 6 weeks.  3.  Obesity: Patient notes weight gain over the past year and is interested in losing weight.  We discussed diet and exercise today.  See #1.  4.  Essential hypertension: Blood pressure is normal in the absence of regular antihypertensive therapy.  5.  Palpitations: Predominantly related to anxiety and managed with propranolol therapy.  6.  Anxiety: On Wellbutrin.  She is not sure that this is working as well as it used to and plans to follow-up with primary care for additional recommendations.  7.  Disposition: Follow-up lipids and LFTs in approximately 6 weeks.  Follow-up with Dr. Saunders Revel in 1 year or sooner if necessary.   Murray Hodgkins, NP 12/16/2019, 4:29 PM

## 2019-12-16 NOTE — Patient Instructions (Signed)
Medication Instructions:  1- START Atorvastatin Take 1 tablet (40 mg total) by mouth daily *If you need a refill on your cardiac medications before your next appointment, please call your pharmacy*   Lab Work: Your physician recommends that you return for lab work in: 6-8 weeks at the medical mall. (fasting lipid and LFT) No appt is needed. Hours are M-F 7AM- 6 PM.  If you have labs (blood work) drawn today and your tests are completely normal, you will receive your results only by:  [place] (if you have MyChart) OR  A paper copy in the mail If you have any lab test that is abnormal or we need to change your treatment, we will call you to review the results.   Testing/Procedures: None ordered    Follow-Up: At [facility], you and your health needs are our priority.  As part of our continuing mission to provide you with exceptional heart care, we have created designated Provider Care Teams.  These Care Teams include your primary Cardiologist (physician) and Advanced Practice Providers (APPs -  Physician Assistants and Nurse Practitioners) who all work together to provide you with the care you need, when you need it.  We recommend signing up for the patient portal called "MyChart".  Sign up information is provided on this After Visit Summary.  MyChart is used to connect with patients for Virtual Visits (Telemedicine).  Patients are able to view lab/test results, encounter notes, upcoming appointments, etc.  Non-urgent messages can be sent to your provider as well.   To learn more about what you can do with MyChart, go to [email or web address].    Your next appointment:   12 month(s)  The format for your next appointment:   In Person  Provider:    You may see Nelva Bush, MD or Murray Hodgkins, NP

## 2019-12-20 ENCOUNTER — Ambulatory Visit: Payer: BC Managed Care – PPO | Admitting: Dermatology

## 2019-12-20 ENCOUNTER — Other Ambulatory Visit: Payer: Self-pay

## 2019-12-20 DIAGNOSIS — L409 Psoriasis, unspecified: Secondary | ICD-10-CM

## 2019-12-20 NOTE — Progress Notes (Signed)
   Follow-Up Visit   Subjective  Colleen Washington is a 59 y.o. female who presents for the following: Psoriasis (palms and soles). She is currently using Enstilar Foam, about the same as last visit. She states that Cosentyx will be delivered tomorrow.  She is getting pt assistance for the medication.   The following portions of the chart were reviewed this encounter and updated as appropriate:      Review of Systems:  No other skin or systemic complaints except as noted in HPI or Assessment and Plan.  Objective  Well appearing patient in no apparent distress; mood and affect are within normal limits.  A focused examination was performed including palms, soles. Relevant physical exam findings are noted in the Assessment and Plan.  Objective  Hands, feet: Erythema and scale of palms. Pink scaly plaque of left medial and plantar foot. Erythema with hyperkeratosis of right medial heel and lat dorsum heel.   Assessment & Plan  Psoriasis Hands, feet  Continue Enstilar foam qd-bid prn.  Patient to start Cosentyx injections tomorrow once delivered. She was trained today in the office with the injection trainer. Advised her if she has any issues with giving herself the first injections, she can come to the office on Thursday and a medical assistant can help her.  Pt will continue injections weekly as directed for loading dose, then monthly.  Other Related Medications Calcipotriene-Betameth Diprop (ENSTILAR) 0.005-0.064 % FOAM  Return in about 8 weeks (around 02/14/2020).  IJamesetta Orleans, CMA, am acting as scribe for Brendolyn Patty, MD .  Documentation: I have reviewed the above documentation for accuracy and completeness, and I agree with the above.  Brendolyn Patty MD

## 2020-01-03 DIAGNOSIS — M62830 Muscle spasm of back: Secondary | ICD-10-CM | POA: Diagnosis not present

## 2020-01-03 DIAGNOSIS — M5033 Other cervical disc degeneration, cervicothoracic region: Secondary | ICD-10-CM | POA: Diagnosis not present

## 2020-01-03 DIAGNOSIS — M9903 Segmental and somatic dysfunction of lumbar region: Secondary | ICD-10-CM | POA: Diagnosis not present

## 2020-01-03 DIAGNOSIS — M9901 Segmental and somatic dysfunction of cervical region: Secondary | ICD-10-CM | POA: Diagnosis not present

## 2020-01-10 ENCOUNTER — Telehealth: Payer: Self-pay

## 2020-01-10 NOTE — Telephone Encounter (Signed)
Called pt discussed Cosentyx rx will cost $50 from [place], pt will need to call [facility] to set up delivery

## 2020-01-13 ENCOUNTER — Encounter: Payer: Self-pay | Admitting: Dermatology

## 2020-01-19 ENCOUNTER — Other Ambulatory Visit: Payer: Self-pay | Admitting: Internal Medicine

## 2020-01-24 DIAGNOSIS — M9901 Segmental and somatic dysfunction of cervical region: Secondary | ICD-10-CM | POA: Diagnosis not present

## 2020-01-24 DIAGNOSIS — M9903 Segmental and somatic dysfunction of lumbar region: Secondary | ICD-10-CM | POA: Diagnosis not present

## 2020-01-24 DIAGNOSIS — M62830 Muscle spasm of back: Secondary | ICD-10-CM | POA: Diagnosis not present

## 2020-01-24 DIAGNOSIS — M5033 Other cervical disc degeneration, cervicothoracic region: Secondary | ICD-10-CM | POA: Diagnosis not present

## 2020-01-31 ENCOUNTER — Telehealth: Payer: Self-pay

## 2020-01-31 NOTE — Telephone Encounter (Signed)
Patient called and left message at 12:20pm 01/31/20 concerning psoriasis. Tried to call patient back at number she left but had to leave VM, JS

## 2020-02-06 ENCOUNTER — Encounter: Payer: Self-pay | Admitting: Dermatology

## 2020-02-07 ENCOUNTER — Other Ambulatory Visit: Payer: Self-pay

## 2020-02-07 MED ORDER — TRIAMCINOLONE ACETONIDE 0.1 % EX OINT
1.0000 | TOPICAL_OINTMENT | Freq: Every day | CUTANEOUS | 0 refills | Status: DC | PRN
Start: 2020-02-07 — End: 2020-07-20

## 2020-02-09 ENCOUNTER — Other Ambulatory Visit: Payer: Self-pay

## 2020-02-09 MED ORDER — PREDNISONE 20 MG PO TABS: 20.0000 mg | ORAL_TABLET | ORAL | 0 refills | Status: DC

## 2020-02-09 NOTE — Progress Notes (Signed)
Pt sent msg through mychart about hands still itching and burning.  Pt recently started Cosentyx and TMC 0.1% oint.  Pt requested prednisone.  Per Dr. Nehemiah Massed I sent in Prednisone 20mg  1 po qam x 3 days, then 1 po qod for 4 doses.  I advised of potential SE of oral prednisone./sh

## 2020-02-14 DIAGNOSIS — M9903 Segmental and somatic dysfunction of lumbar region: Secondary | ICD-10-CM | POA: Diagnosis not present

## 2020-02-14 DIAGNOSIS — M5033 Other cervical disc degeneration, cervicothoracic region: Secondary | ICD-10-CM | POA: Diagnosis not present

## 2020-02-14 DIAGNOSIS — M9901 Segmental and somatic dysfunction of cervical region: Secondary | ICD-10-CM | POA: Diagnosis not present

## 2020-02-14 DIAGNOSIS — M62830 Muscle spasm of back: Secondary | ICD-10-CM | POA: Diagnosis not present

## 2020-02-15 ENCOUNTER — Other Ambulatory Visit: Payer: Self-pay | Admitting: Dermatology

## 2020-02-17 ENCOUNTER — Other Ambulatory Visit: Payer: Self-pay | Admitting: Dermatology

## 2020-02-22 ENCOUNTER — Ambulatory Visit: Payer: BC Managed Care – PPO | Admitting: Dermatology

## 2020-02-22 ENCOUNTER — Other Ambulatory Visit: Payer: Self-pay

## 2020-02-22 ENCOUNTER — Encounter: Payer: Self-pay | Admitting: Dermatology

## 2020-02-22 DIAGNOSIS — L409 Psoriasis, unspecified: Secondary | ICD-10-CM

## 2020-02-22 NOTE — Patient Instructions (Addendum)
Recommend daily broad spectrum sunscreen SPF 30+ to sun-exposed areas, reapply every 2 hours as needed. Call for new or changing lesions.  Gentle Skin Care Guide  1. Bathe no more than once a day.  2. Avoid bathing in hot water   3. Use a mild soap like Dove, Vanicream, Cetaphil, CeraVe. Can use Lever 2000 or Cetaphil antibacterial soap  4. Use soap only where you need it. On most days, use it under your arms, between your legs, and on your feet. Let the water rinse other areas unless visibly dirty.  5. When you get out of the bath/shower, use a towel to gently blot your skin dry, don't rub it.  6. While your skin is still a little damp, apply a moisturizing cream such as Vanicream, CeraVe, Cetaphil, Eucerin, Sarna lotion or plain Vaseline Jelly. For hands apply Neutrogena Norwegian Hand Cream or Excipial Hand Cream.  7. Reapply moisturizer any time you start to itch or feel dry.  8. Sometimes using free and clear laundry detergents can be helpful. Fabric softener sheets should be avoided. Downy Free & Gentle liquid, or any liquid fabric softener that is free of dyes and perfumes, it acceptable to use  9. If your doctor has given you prescription creams you may apply moisturizers over them     

## 2020-02-22 NOTE — Progress Notes (Signed)
   Follow-Up Visit   Subjective  Colleen Washington is a 59 y.o. female who presents for the following: Follow-up (psoriasis, on cosentyx).  Patient presents today for follow up on OV  12/20/19 for psoriasis. Started Cosentyx at that time. Patient is no long using Enstilar foam or TMC 0.1%. However has continued using Betamethasone dipropionate prn.  She had a bad flare on her feet after starting Cosentyx, and Dr. Nehemiah Massed called in Prednisone which helped some.  Feet are still really scaly and some pain.  Hands and elbows are better.  No other side effects noted from injections.  The following portions of the chart were reviewed this encounter and updated as appropriate:      Review of Systems:  No other skin or systemic complaints except as noted in HPI or Assessment and Plan.  Objective  Well appearing patient in no apparent distress; mood and affect are within normal limits.  A focused examination was performed including  Left Elbow, Hands and Feet. Relevant physical exam findings are noted in the Assessment and Plan.  Objective  B/L hands and feet, and right elbow, Right Medial Plantar Surface: Plantar feet, hyperkeratosis with fissures, multiple dried small pustules. R>L Mild erythema and scale on Left elbow and base of left palm   Assessment & Plan  Psoriasis (2) B/L hands and feet, and right elbow; Right Medial Plantar Surface  Recent flare on feet while on Cosentyx- healing post Prednisone taper Continue Cosentyx 300 mg SQ qmo Continue Enstilar foam to feet once daily and TMC 0.1% ointment qd Continue Epson salt soaks prn and moisturizer with Sal Acid (CeraVe), but don't use at same time as Dispensing optician.  Will reassess in 3 months to see if psoriasis on feet improves.    Other Related Medications Calcipotriene-Betameth Diprop (ENSTILAR) 0.005-0.064 % FOAM  Return in about 3 months (around 05/24/2020) for psoriasis, on Cosentyx.  Long term medication management.  Marene Lenz, CMA, am acting as scribe for Brendolyn Patty, MD . Documentation: I have reviewed the above documentation for accuracy and completeness, and I agree with the above.  Brendolyn Patty MD

## 2020-03-15 ENCOUNTER — Other Ambulatory Visit: Payer: Self-pay | Admitting: Internal Medicine

## 2020-03-23 DIAGNOSIS — M9901 Segmental and somatic dysfunction of cervical region: Secondary | ICD-10-CM | POA: Diagnosis not present

## 2020-03-23 DIAGNOSIS — M62830 Muscle spasm of back: Secondary | ICD-10-CM | POA: Diagnosis not present

## 2020-03-23 DIAGNOSIS — M5033 Other cervical disc degeneration, cervicothoracic region: Secondary | ICD-10-CM | POA: Diagnosis not present

## 2020-03-23 DIAGNOSIS — M9903 Segmental and somatic dysfunction of lumbar region: Secondary | ICD-10-CM | POA: Diagnosis not present

## 2020-04-19 ENCOUNTER — Other Ambulatory Visit: Payer: Self-pay | Admitting: Internal Medicine

## 2020-04-20 DIAGNOSIS — M9903 Segmental and somatic dysfunction of lumbar region: Secondary | ICD-10-CM | POA: Diagnosis not present

## 2020-04-20 DIAGNOSIS — M9901 Segmental and somatic dysfunction of cervical region: Secondary | ICD-10-CM | POA: Diagnosis not present

## 2020-04-20 DIAGNOSIS — M5033 Other cervical disc degeneration, cervicothoracic region: Secondary | ICD-10-CM | POA: Diagnosis not present

## 2020-04-20 DIAGNOSIS — M62830 Muscle spasm of back: Secondary | ICD-10-CM | POA: Diagnosis not present

## 2020-05-02 ENCOUNTER — Encounter: Payer: Self-pay | Admitting: Dermatology

## 2020-05-08 ENCOUNTER — Other Ambulatory Visit: Payer: Self-pay

## 2020-05-08 ENCOUNTER — Ambulatory Visit: Payer: BC Managed Care – PPO | Admitting: Dermatology

## 2020-05-08 DIAGNOSIS — L409 Psoriasis, unspecified: Secondary | ICD-10-CM | POA: Diagnosis not present

## 2020-05-08 MED ORDER — ENSTILAR 0.005-0.064 % EX FOAM: CUTANEOUS | 3 refills | Status: DC

## 2020-05-08 NOTE — Progress Notes (Signed)
   Follow-Up Visit   Subjective  Colleen Washington is a 59 y.o. female who presents for the following: Psoriasis (feet, hands itching, burning, Cosentyx injections, Enstilar foam, TMC 0.1% oint, cerave cream).  Feet are painful.   The following portions of the chart were reviewed this encounter and updated as appropriate:      Review of Systems:  No other skin or systemic complaints except as noted in HPI or Assessment and Plan.  Objective  Well appearing patient in no apparent distress; mood and affect are within normal limits.  A focused examination was performed including bil feet, hands. Relevant physical exam findings are noted in the Assessment and Plan.  Objective  bil feet and hands: Extensive erythema with hyperkeratosis R >L plantar foot, R palm   Assessment & Plan  Psoriasis bil feet and hands  Currently Flared  Cont Cosentyx sq injections q 4 wks Cont Enstilar foam qam aa feet/hands Start Duobrii Lotion qhs, samples given, pt will call if she wants a prescription, lot [id number] [phone]  Pt is post menopausal Pending labs start Soriatane 10mg  1 po qd Consider adding XTRAC laser if covered benefit  Reviewed potential side effects of isotretinoin/soriatane including xerosis, cheilitis, hepatitis, hyperlipidemia, and birth defects if taken by a pregnant woman. Reviewed reports of suicidal ideation in those with a history of depression while taking isotretinoin/soriatane and reports of diagnosis of inflammatory bowl disease while taking isotretinoin as well as the lack of evidence for a causal relationship between isotretinoin/soriatane, depression and IBD. Patient advised to reach out with any questions or concerns.   Other Related Procedures Comprehensive metabolic panel Lipid panel  Reordered Medications Calcipotriene-Betameth Diprop (ENSTILAR) 0.005-0.064 % FOAM  Return in about 1 month (around 06/08/2020) for Psoriasis f/u.  I, Othelia Pulling, RMA, am acting as  scribe for Brendolyn Patty, MD . Documentation: I have reviewed the above documentation for accuracy and completeness, and I agree with the above.  Brendolyn Patty MD

## 2020-05-11 ENCOUNTER — Other Ambulatory Visit: Payer: Self-pay

## 2020-05-11 MED ORDER — DUOBRII 0.01-0.045 % EX LOTN
1.0000 | TOPICAL_LOTION | Freq: Every day | CUTANEOUS | 1 refills | Status: DC
Start: 2020-05-11 — End: 2020-06-28

## 2020-05-11 NOTE — Progress Notes (Signed)
Duobrii RX sent in per patient's request.

## 2020-05-18 ENCOUNTER — Telehealth: Payer: Self-pay

## 2020-05-18 DIAGNOSIS — M9901 Segmental and somatic dysfunction of cervical region: Secondary | ICD-10-CM | POA: Diagnosis not present

## 2020-05-18 DIAGNOSIS — M9903 Segmental and somatic dysfunction of lumbar region: Secondary | ICD-10-CM | POA: Diagnosis not present

## 2020-05-18 DIAGNOSIS — M62830 Muscle spasm of back: Secondary | ICD-10-CM | POA: Diagnosis not present

## 2020-05-18 DIAGNOSIS — M5033 Other cervical disc degeneration, cervicothoracic region: Secondary | ICD-10-CM | POA: Diagnosis not present

## 2020-05-18 NOTE — Telephone Encounter (Signed)
Duobrii Cream was denied by patient's insurance.  Patient has to have a documented failure to tazarotene cream and generic halobetasol 0.05% cream or ointment used together.  Enstilar PA was approved.

## 2020-05-22 DIAGNOSIS — G8929 Other chronic pain: Secondary | ICD-10-CM

## 2020-05-22 DIAGNOSIS — M79672 Pain in left foot: Secondary | ICD-10-CM

## 2020-05-22 NOTE — Telephone Encounter (Signed)
She doesn't need both of them- we sent in both hoping at least one would be covered.  If the Colleen Washington was approved, then she can use that instead of the Duobrii.

## 2020-05-23 NOTE — Telephone Encounter (Signed)
Patient advised of information per Dr. Nicole Kindred.

## 2020-05-23 NOTE — Telephone Encounter (Signed)
Pt called to follow up on Mychart and said that she has not seen Dr. Derrel Nip for foot pain  Pt said that she couldn't wait for an appt and just needed a referral placed

## 2020-05-25 DIAGNOSIS — M62830 Muscle spasm of back: Secondary | ICD-10-CM | POA: Diagnosis not present

## 2020-05-25 DIAGNOSIS — M9903 Segmental and somatic dysfunction of lumbar region: Secondary | ICD-10-CM | POA: Diagnosis not present

## 2020-05-25 DIAGNOSIS — M5033 Other cervical disc degeneration, cervicothoracic region: Secondary | ICD-10-CM | POA: Diagnosis not present

## 2020-05-25 DIAGNOSIS — M9901 Segmental and somatic dysfunction of cervical region: Secondary | ICD-10-CM | POA: Diagnosis not present

## 2020-06-12 ENCOUNTER — Telehealth: Payer: Self-pay

## 2020-06-12 ENCOUNTER — Ambulatory Visit: Payer: BC Managed Care – PPO | Admitting: Dermatology

## 2020-06-12 MED ORDER — ESOMEPRAZOLE MAGNESIUM 40 MG PO CPDR: DELAYED_RELEASE_CAPSULE | ORAL | 1 refills | Status: DC

## 2020-06-12 NOTE — Telephone Encounter (Signed)
Pt need esomeprazole sent to [facility]. She said pharmacy told her they have sent the request twice to [place]. Pt has 4 pills left.

## 2020-06-19 DIAGNOSIS — M5033 Other cervical disc degeneration, cervicothoracic region: Secondary | ICD-10-CM | POA: Diagnosis not present

## 2020-06-19 DIAGNOSIS — M62830 Muscle spasm of back: Secondary | ICD-10-CM | POA: Diagnosis not present

## 2020-06-19 DIAGNOSIS — M9903 Segmental and somatic dysfunction of lumbar region: Secondary | ICD-10-CM | POA: Diagnosis not present

## 2020-06-19 DIAGNOSIS — M9901 Segmental and somatic dysfunction of cervical region: Secondary | ICD-10-CM | POA: Diagnosis not present

## 2020-06-28 ENCOUNTER — Ambulatory Visit (INDEPENDENT_AMBULATORY_CARE_PROVIDER_SITE_OTHER): Payer: BC Managed Care – PPO | Admitting: Internal Medicine

## 2020-06-28 ENCOUNTER — Other Ambulatory Visit: Payer: Self-pay

## 2020-06-28 ENCOUNTER — Encounter: Payer: Self-pay | Admitting: Internal Medicine

## 2020-06-28 VITALS — BP 110/92 | HR 94 | Temp 97.8°F | Ht 67.01 in | Wt 197.2 lb

## 2020-06-28 DIAGNOSIS — E041 Nontoxic single thyroid nodule: Secondary | ICD-10-CM

## 2020-06-28 DIAGNOSIS — R002 Palpitations: Secondary | ICD-10-CM

## 2020-06-28 DIAGNOSIS — E7800 Pure hypercholesterolemia, unspecified: Secondary | ICD-10-CM | POA: Diagnosis not present

## 2020-06-28 DIAGNOSIS — K2100 Gastro-esophageal reflux disease with esophagitis, without bleeding: Secondary | ICD-10-CM

## 2020-06-28 DIAGNOSIS — E559 Vitamin D deficiency, unspecified: Secondary | ICD-10-CM

## 2020-06-28 DIAGNOSIS — F331 Major depressive disorder, recurrent, moderate: Secondary | ICD-10-CM

## 2020-06-28 DIAGNOSIS — I10 Essential (primary) hypertension: Secondary | ICD-10-CM | POA: Diagnosis not present

## 2020-06-28 DIAGNOSIS — Z1211 Encounter for screening for malignant neoplasm of colon: Secondary | ICD-10-CM | POA: Diagnosis not present

## 2020-06-28 DIAGNOSIS — F410 Panic disorder [episodic paroxysmal anxiety] without agoraphobia: Secondary | ICD-10-CM

## 2020-06-28 MED ORDER — PANTOPRAZOLE SODIUM 40 MG PO TBEC: 40.0000 mg | DELAYED_RELEASE_TABLET | Freq: Every day | ORAL | 3 refills | Status: DC

## 2020-06-28 MED ORDER — FAMOTIDINE 40 MG PO TABS
40.0000 mg | ORAL_TABLET | Freq: Every day | ORAL | 1 refills | Status: DC
Start: 2020-06-28 — End: 2020-12-29

## 2020-06-28 MED ORDER — ZOSTER VAC RECOMB ADJUVANTED 50 MCG/0.5ML IM SUSR
0.5000 mL | Freq: Once | INTRAMUSCULAR | 1 refills | Status: AC
Start: 2020-06-28 — End: 2020-06-28

## 2020-06-28 MED ORDER — BUPROPION HCL ER (SR) 200 MG PO TB12: 200.0000 mg | ORAL_TABLET | Freq: Two times a day (BID) | ORAL | 1 refills | Status: DC

## 2020-06-28 NOTE — Progress Notes (Signed)
Subjective:  Patient ID: Colleen Washington, female    DOB: 1960-09-27  Age: 59 y.o. MRN: [id number]  CC: The primary encounter diagnosis was Colon cancer screening. 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  HPI Colleen Washington presents for follow up on multiple issues  Hyperlipidemia:  She has been taking atorvastatin since September.  And tolerating the medication   BP has been  elevated due to anxiety (per patient) .  She has had a lot of stressful events recently . Her favorite Maudry Diego was killed 2 weeks ago in a head on collision after visiting her. They used to travel together .  Cousin's dog was also involved in the accident and had to be euthanized because of injuries.    Mother in law died 4 months ago.  She has been caring for her  Waking up earlier than unusual  Some trouble falling asleep too .  Using propranolol prn none until recently prn palpitations New job, training herself ,  Editor, commissioning,    Diagnosed with psoriasis and under treatment with Cosentyx injections.  For the past several months.   Skin was peeling and thickening  Saw podiatry and diagnosed with plantar fasciitis of the  right foot, s/p injections with  improving pain . Orthotics recommended Acid reflux worse,  Nocturnal symptoms  The worst. nexium not covered but has been taking it daily in the morning  S/p total hysterectomy    Outpatient Medications Prior to Visit  Medication Sig Dispense Refill  . albuterol (VENTOLIN HFA) 108 (90 Base) MCG/ACT inhaler TAKE 2 PUFFS BY MOUTH EVERY 6 HOURS AS NEEDED FOR WHEEZE OR SHORTNESS OF BREATH 18 g 2  . busPIRone (BUSPAR) 15 MG tablet TAKE 1 TABLET (15 MG TOTAL) BY MOUTH 2  (TWO) TIMES DAILY AS NEEDED. 180 tablet 2  . Calcipotriene-Betameth Diprop (ENSTILAR) 0.005-0.064 % FOAM Apply to affected areas palms and soles 1-2 times daily until improved. Avoid face, groin, underarms. 60 g 3  . cetirizine (ZYRTEC) 10 MG tablet Take 10 mg by mouth daily. Reported on 09/12/2015    . COSENTYX SENSOREADY, 300 MG, 150 MG/ML SOAJ RX1: INJECT 2 PENS (300 MG) UNDER THE SKIN (SUBCUTANEOUS INJECTION) AT WEEK 3 AND WEEK 4 AS DIRECTED (THEN 2 PENS EVERY 4 WEEKS) 1 mL 3  . fluocinonide-emollient (LIDEX-E) 0.05 % cream Apply 1 application topically 2 (two) times daily. 30 g 0  . fluticasone (FLONASE) 50 MCG/ACT nasal spray USE 2 SPRAYS EACH NOSTRIL DAILY 16 g 3  . hyoscyamine (LEVSIN SL) 0.125 MG SL tablet PLACE 1 TABLET UNDER THE TONGUE EVERY 4 HOURS AS NEEDED 30 tablet 1  . metoprolol tartrate (LOPRESSOR) 50 MG tablet Take 1 tablet (50 mg) by mouth 1-2 hours prior to the CT. 1 tablet 0  . phentermine (ADIPEX-P) 37.5 MG tablet Take 37.5 mg by mouth every morning.    . promethazine (PHENERGAN) 12.5 MG tablet TAKE ONE TABLET BY MOUTH EVERY 6 HOURS AS NEEDED FOR NAUSEA AND VOMITING. 30 tablet 0  . propranolol (INDERAL) 10 MG tablet TAKE 1 TABLET (10 MG TOTAL) BY MOUTH 3 (THREE) TIMES DAILY. AS NEEDED FOR RAPID HEART RATE 30 tablet 0  . Secukinumab, 300 MG Dose, (COSENTYX SENSOREADY, 300 MG,) 150 MG/ML SOAJ Inject 300 mg into the skin as  directed. On week 0, 1, 2, 3 and 4. 10 pen 0  . Secukinumab, 300 MG Dose, (COSENTYX SENSOREADY, 300 MG,) 150 MG/ML SOAJ Inject 300 mg into the skin every 28 (twenty-eight) days. For maintenance. 2 pen 5  . triamcinolone ointment (KENALOG) 0.1 % Apply 1 application topically daily as needed. 453 g 0  . buPROPion (WELLBUTRIN XL) 150 MG 24 hr tablet TAKE 1 TABLET BY MOUTH EVERY DAY 90 tablet 2  . esomeprazole (NEXIUM) 40 MG capsule TAKE 1 CAPSULE BY MOUTH EVERY DAY BEFORE BREAKFAST 90 capsule 1  . atorvastatin (LIPITOR) 40 MG tablet Take 1 tablet (40 mg total) by  mouth daily. 90 tablet 3  . betamethasone dipropionate 0.05 % cream Apply topically 2 (two) times daily. 30 g 0  . Halobetasol Prop-Tazarotene (DUOBRII) 0.01-0.045 % LOTN Apply 1 application topically at bedtime. 100 g 1  . predniSONE (DELTASONE) 20 MG tablet Take 1 tablet (20 mg total) by mouth as directed. Take 1 po qam for 3 days, then decrease to 1 po qod for 4 doses.  Take in the am. 7 tablet 0   No facility-administered medications prior to visit.    Review of Systems;  Patient denies headache, fevers, malaise, unintentional weight loss, skin rash, eye pain, sinus congestion and sinus pain, sore throat, dysphagia,  hemoptysis , cough, dyspnea, wheezing, chest pain, palpitations, orthopnea, edema, abdominal pain, nausea, melena, diarrhea, constipation, flank pain, dysuria, hematuria, urinary  Frequency, nocturia, numbness, tingling, seizures,  Focal weakness, Loss of consciousness,  Tremor, insomnia, depression, anxiety, and suicidal ideation.      Objective:  BP (!) 110/92 (BP Location: Left Arm, Patient Position: Sitting)   Pulse 94   Temp 97.8 F (36.6 C)   Ht 5' 7.01" (1.702 m)   Wt 197 lb 3.2 oz (89.4 kg)   SpO2 94%   BMI 30.88 kg/m   BP Readings from Last 3 Encounters:  06/28/20 (!) 110/92  12/16/19 104/86  11/25/19 118/88    Wt Readings from Last 3 Encounters:  06/28/20 197 lb 3.2 oz (89.4 kg)  12/16/19 201 lb (91.2 kg)  10/21/19 201 lb 4 oz (91.3 kg)    General appearance: alert, cooperative and appears stated age Ears: normal TM's and external ear canals both ears Throat: lips, mucosa, and tongue normal; teeth and gums normal Neck: no adenopathy, no carotid bruit, supple, symmetrical, trachea midline and thyroid not enlarged, symmetric, no tenderness/mass/nodules Back: symmetric, no curvature. ROM normal. No CVA tenderness. Lungs: clear to auscultation bilaterally Heart: regular rate and rhythm, S1, S2 normal, no murmur, click, rub or gallop Abdomen: soft,  non-tender; bowel sounds normal; no masses,  no organomegaly Pulses: 2+ and symmetric Skin: Skin color, texture, turgor normal. No rashes or lesions Lymph nodes: Cervical, supraclavicular, and axillary nodes normal.  Lab Results  Component Value Date   HGBA1C 5.2 10/14/2016    Lab Results  Component Value Date   CREATININE 0.91 06/28/2020   CREATININE 0.92 11/19/2019   CREATININE 0.87 08/12/2018    Lab Results  Component Value Date   WBC 5.3 06/28/2020   HGB 13.3 06/28/2020   HCT 39.5 06/28/2020   PLT 167.0 06/28/2020   GLUCOSE 85 06/28/2020   CHOL 171 06/28/2020   TRIG 131.0 06/28/2020   HDL 52.30 06/28/2020   LDLCALC 92 06/28/2020   ALT 15 06/28/2020   AST 13 06/28/2020   NA 139 06/28/2020   K 3.9 06/28/2020   CL 104 06/28/2020   CREATININE 0.91 06/28/2020  BUN 11 06/28/2020   CO2 28 06/28/2020   TSH 1.42 06/28/2020   HGBA1C 5.2 10/14/2016    No results found.  Assessment & Plan:   Problem List Items Addressed This Visit      Unprioritized   Depression    Recurrent ,  Aggravated by the recent loss of her cousin and mother in law .  Continue wellbutrin but changing to 200 mg bid       Relevant Medications   buPROPion (WELLBUTRIN SR) 200 MG 12 hr tablet   GERD (gastroesophageal reflux disease)    Continue omeprazole in the am and adding famotidine in the evening for nocturnal symptoms      Relevant Medications   pantoprazole (PROTONIX) 40 MG tablet   famotidine (PEPCID) 40 MG tablet   Hyperlipidemia   Relevant Orders   Lipid panel (Completed)   Essential hypertension    Not Well controlled on current regimen by her home readings.  She will bring her home readings and machine in for comparison.  Renal function stable, no changes today.      Relevant Orders   Comprehensive metabolic panel (Completed)   Panic disorder without agoraphobia with mild panic attacks    Continue buspirone 15 mg bid       Relevant Medications   buPROPion (WELLBUTRIN  SR) 200 MG 12 hr tablet   Vitamin D deficiency    Recurrent.  Megadose weekly prescribed for 3 months       Relevant Orders   VITAMIN D 25 Hydroxy (Vit-D Deficiency, Fractures) (Completed)   Thyroid nodule   Relevant Orders   TSH (Completed)   Palpitations   Relevant Orders   CBC with Differential/Platelet (Completed)    Other Visit Diagnoses    Colon cancer screening    -  Primary   Relevant Orders   Ambulatory referral to Gastroenterology      I have discontinued [facility] M. [place] "Colleen Washington"'s betamethasone dipropionate, predniSONE, Duobrii, and esomeprazole. I have also changed her buPROPion. Additionally, I am having her start on pantoprazole, famotidine, Zoster Vaccine Adjuvanted, and ergocalciferol. Lastly, I am having her maintain her cetirizine, hyoscyamine, fluticasone, phentermine, busPIRone, fluocinonide-emollient, propranolol, albuterol, metoprolol tartrate, Cosentyx Sensoready (300 MG), Cosentyx Sensoready (300 MG), atorvastatin, promethazine, triamcinolone ointment, Cosentyx Sensoready (300 MG), and Enstilar.  Meds ordered this encounter  Medications  . pantoprazole (PROTONIX) 40 MG tablet    Sig: Take 1 tablet (40 mg total) by mouth daily.    Dispense:  30 tablet    Refill:  3  . famotidine (PEPCID) 40 MG tablet    Sig: Take 1 tablet (40 mg total) by mouth at bedtime.    Dispense:  90 tablet    Refill:  1  . buPROPion (WELLBUTRIN SR) 200 MG 12 hr tablet    Sig: Take 1 tablet (200 mg total) by mouth 2 (two) times daily.    Dispense:  180 tablet    Refill:  1  . Zoster Vaccine Adjuvanted [facility]) injection    Sig: Inject 0.5 mLs into the muscle once for 1 dose.    Dispense:  1 each    Refill:  1  . ergocalciferol (DRISDOL) 1.25 MG (50000 UT) capsule    Sig: Take 1 capsule (50,000 Units total) by mouth once a week.    Dispense:  12 capsule    Refill:  0   A total of 40 minutes was spent with patient more than half of which was spent in counseling patient on the  above mentioned issues , reviewing and explaining recent labs and imaging studies done, and coordination of care.  Medications Discontinued During This Encounter  Medication Reason  . predniSONE (DELTASONE) 20 MG tablet Completed Course  . Halobetasol Prop-Tazarotene (DUOBRII) 0.01-0.045 % LOTN Completed Course  . betamethasone dipropionate 0.05 % cream Completed Course  . esomeprazole (NEXIUM) 40 MG capsule   . buPROPion (WELLBUTRIN XL) 150 MG 24 hr tablet     Follow-up: Return in about 4 weeks (around 07/26/2020).   Crecencio Mc, MD

## 2020-06-28 NOTE — Patient Instructions (Addendum)
Check BP 3 times over next 2 weeks and send me readings.    Protonix once  Daily in the am instead of nexium  Adding H2 blocker,   famotidine  40 mg in the evening  For nighttime symptoms    We are changing your wellbutrin to 200 mg twice daily   Get some sunshine every day!   Return 1 month   Colon cance screening needed:  Referral made to Dr Haig Prophet at [facility]

## 2020-06-29 LAB — CBC WITH DIFFERENTIAL/PLATELET
Basophils Absolute: 0.1 10*3/uL (ref 0.0–0.1)
Basophils Relative: 1.1 % (ref 0.0–3.0)
Eosinophils Absolute: 0.3 10*3/uL (ref 0.0–0.7)
Eosinophils Relative: 5 % (ref 0.0–5.0)
HCT: 39.5 % (ref 36.0–46.0)
Hemoglobin: 13.3 g/dL (ref 12.0–15.0)
Lymphocytes Relative: 46.4 % — ABNORMAL HIGH (ref 12.0–46.0)
Lymphs Abs: 2.4 10*3/uL (ref 0.7–4.0)
MCHC: 33.6 g/dL (ref 30.0–36.0)
MCV: 87.6 fl (ref 78.0–100.0)
Monocytes Absolute: 0.4 10*3/uL (ref 0.1–1.0)
Monocytes Relative: 8.2 % (ref 3.0–12.0)
Neutro Abs: 2.1 10*3/uL (ref 1.4–7.7)
Neutrophils Relative %: 39.3 % — ABNORMAL LOW (ref 43.0–77.0)
Platelets: 167 10*3/uL (ref 150.0–400.0)
RBC: 4.51 Mil/uL (ref 3.87–5.11)
RDW: 13.4 % (ref 11.5–15.5)
WBC: 5.3 10*3/uL (ref 4.0–10.5)

## 2020-06-29 LAB — LIPID PANEL
Cholesterol: 171 mg/dL (ref 0–200)
HDL: 52.3 mg/dL (ref >39.00)
LDL Cholesterol: 92 mg/dL (ref 0–99)
NonHDL: 118.43
Total CHOL/HDL Ratio: 3
Triglycerides: 131 mg/dL (ref 0.0–149.0)
VLDL: 26.2 mg/dL (ref 0.0–40.0)

## 2020-06-29 LAB — COMPREHENSIVE METABOLIC PANEL
ALT: 15 U/L (ref 0–35)
AST: 13 U/L (ref 0–37)
Albumin: 4.2 g/dL (ref 3.5–5.2)
Alkaline Phosphatase: 79 U/L (ref 39–117)
BUN: 11 mg/dL (ref 6–23)
CO2: 28 mEq/L (ref 19–32)
Calcium: 9.2 mg/dL (ref 8.4–10.5)
Chloride: 104 mEq/L (ref 96–112)
Creatinine, Ser: 0.91 mg/dL (ref 0.40–1.20)
GFR: 69.68 mL/min (ref >60.00)
Glucose, Bld: 85 mg/dL (ref 70–99)
Potassium: 3.9 mEq/L (ref 3.5–5.1)
Sodium: 139 mEq/L (ref 135–145)
Total Bilirubin: 0.8 mg/dL (ref 0.2–1.2)
Total Protein: 7.2 g/dL (ref 6.0–8.3)

## 2020-06-29 LAB — VITAMIN D 25 HYDROXY (VIT D DEFICIENCY, FRACTURES): VITD: 19.28 ng/mL — ABNORMAL LOW (ref 30.00–100.00)

## 2020-06-29 LAB — TSH: TSH: 1.42 u[IU]/mL (ref 0.35–4.50)

## 2020-06-30 MED ORDER — ERGOCALCIFEROL 1.25 MG (50000 UT) PO CAPS: 50000.0000 [IU] | ORAL_CAPSULE | ORAL | 0 refills | Status: DC

## 2020-06-30 NOTE — Assessment & Plan Note (Signed)
Recurrent.  Megadose weekly prescribed for 3 months

## 2020-07-01 NOTE — Assessment & Plan Note (Signed)
Continue buspirone 15 mg bid  

## 2020-07-01 NOTE — Assessment & Plan Note (Addendum)
Not Well controlled on current regimen by her home readings.  She will bring her home readings and machine in for comparison.  Renal function stable, no changes today.

## 2020-07-01 NOTE — Assessment & Plan Note (Signed)
Continue omeprazole in the am and adding famotidine in the evening for nocturnal symptoms

## 2020-07-01 NOTE — Assessment & Plan Note (Addendum)
Recurrent ,  Aggravated by the recent loss of her cousin and mother in law .  Continue wellbutrin but changing to 200 mg bid

## 2020-07-17 DIAGNOSIS — M9901 Segmental and somatic dysfunction of cervical region: Secondary | ICD-10-CM | POA: Diagnosis not present

## 2020-07-17 DIAGNOSIS — M9903 Segmental and somatic dysfunction of lumbar region: Secondary | ICD-10-CM | POA: Diagnosis not present

## 2020-07-17 DIAGNOSIS — M62830 Muscle spasm of back: Secondary | ICD-10-CM | POA: Diagnosis not present

## 2020-07-17 DIAGNOSIS — M5033 Other cervical disc degeneration, cervicothoracic region: Secondary | ICD-10-CM | POA: Diagnosis not present

## 2020-07-19 ENCOUNTER — Ambulatory Visit: Payer: BC Managed Care – PPO | Admitting: Dermatology

## 2020-07-20 ENCOUNTER — Encounter: Payer: Self-pay | Admitting: Internal Medicine

## 2020-07-20 ENCOUNTER — Other Ambulatory Visit: Payer: Self-pay

## 2020-07-20 ENCOUNTER — Other Ambulatory Visit: Payer: Self-pay | Admitting: Dermatology

## 2020-07-20 ENCOUNTER — Telehealth (INDEPENDENT_AMBULATORY_CARE_PROVIDER_SITE_OTHER): Payer: BC Managed Care – PPO | Admitting: Internal Medicine

## 2020-07-20 ENCOUNTER — Telehealth: Payer: Self-pay

## 2020-07-20 VITALS — Ht 67.01 in | Wt 197.0 lb

## 2020-07-20 DIAGNOSIS — J40 Bronchitis, not specified as acute or chronic: Secondary | ICD-10-CM | POA: Diagnosis not present

## 2020-07-20 DIAGNOSIS — U071 COVID-19: Secondary | ICD-10-CM

## 2020-07-20 MED ORDER — AZITHROMYCIN 250 MG PO TABS: ORAL_TABLET | ORAL | 0 refills | Status: DC

## 2020-07-20 MED ORDER — HYDROCOD POLST-CPM POLST ER 10-8 MG/5ML PO SUER
5.0000 mL | Freq: Every evening | ORAL | 0 refills | Status: DC | PRN
Start: 2020-07-20 — End: 2020-08-03

## 2020-07-20 MED ORDER — ALBUTEROL SULFATE HFA 108 (90 BASE) MCG/ACT IN AERS: 1.0000 | INHALATION_SPRAY | RESPIRATORY_TRACT | 2 refills | Status: DC | PRN

## 2020-07-20 MED ORDER — FLUCONAZOLE 150 MG PO TABS: 150.0000 mg | ORAL_TABLET | Freq: Once | ORAL | 0 refills | Status: AC

## 2020-07-20 MED ORDER — DEXAMETHASONE 6 MG PO TABS: 6.0000 mg | ORAL_TABLET | Freq: Every day | ORAL | 0 refills | Status: DC

## 2020-07-20 NOTE — Patient Instructions (Signed)
There is no medication other than over the counter meds: Mucinex dm green label for cough. Vitamin C 1000 mg daily. Vitamin D3 4000 Iu (units) daily. Zinc 100 mg daily. Quercetin 250-500 mg 2 times per day  Monitor pulse oximeter, buy from [facility] if oxygen is less than 90 please go to the hospital.   Elderberry syrup Oil of oregano liquid   Are you feeling really sick? Shortness of breath, dizzy, confused cough, chest pain? If so let me know  If worsening, go to hospital.

## 2020-07-20 NOTE — Progress Notes (Signed)
Patient has had Cough, congestion, sneezing, SOB, chest tightness, fatigue, itchy ears and diarrhea. Patient states sx started on Saturday with fatigue. Patient has been taking advil cold and sinus, advil cold and flu, has had to use her albuterol inhaler off and on. Patient did get covid tested at the hospital and results came back positive this morning.

## 2020-07-20 NOTE — Telephone Encounter (Signed)
Called and emailed infusion center for patient to have infusion done today. Left her name, MRN, DOB and contact number. Infusion center # [phone].

## 2020-07-20 NOTE — Progress Notes (Signed)
Virtual Visit via Video Note  I connected with Colleen Washington  on 07/20/20 at  3:20 PM EST by a video enabled telemedicine application and verified that I am speaking with the correct person using two identifiers.  Location patient: home, [place] Location provider:work or home office Persons participating in the virtual visit: patient, provider  I discussed the limitations of evaluation and management by telemedicine and the availability of in person appointments. The patient expressed understanding and agreed to proceed.   HPI: 1. 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  shes tried advil cold and flu and advil cold and sinus no taken temp.    Exposure risk Xmas was around family her husband also does not feel well   Letter for work provided   ROS: See pertinent positives and negatives per HPI.  Past Medical History:  Diagnosis Date   allergic rhinitis    Anxiety    AR (allergic rhinitis)    Asthma    Depression    Endometriosis    GERD (gastroesophageal reflux disease)    H/O migraine    Herniated intervertebral disc    History of diverticulitis of colon    Hyperlipidemia    Hypertension    IBS (irritable bowel syndrome)    Irritable bowel syndrome    Nonobstructive CAD (coronary artery disease)    a. 11/2019 Cor CTA: Cor Ca2+ = 141 (95th %'ile). LAD 25-49p.   Psoriasis    Tobacco abuse     Past Surgical History:  Procedure Laterality Date   ABDOMINAL HYSTERECTOMY  2003   [facility]   BREAST BIOPSY Left 08/2014   benign-BENIGN BREAST TISSUE WITH FIBROSIS   DILATION AND CURETTAGE OF UTERUS       Current Outpatient  Medications:    atorvastatin (LIPITOR) 40 MG tablet, Take 1 tablet (40 mg total) by mouth daily., Disp: 90 tablet, Rfl: 3   azithromycin (ZITHROMAX) 250 MG tablet, 2 pills day 1 and 1 pill day 2-5 with food, Disp: 6 tablet, Rfl: 0   buPROPion (WELLBUTRIN SR) 200 MG 12 hr tablet, Take 1 tablet (200 mg total) by mouth 2 (two) times daily., Disp: 180 tablet, Rfl: 1   busPIRone (BUSPAR) 15 MG tablet, TAKE 1 TABLET (15 MG TOTAL) BY MOUTH 2 (TWO) TIMES DAILY AS NEEDED., Disp: 180 tablet, Rfl: 2   Calcipotriene-Betameth Diprop (ENSTILAR) 0.005-0.064 % FOAM, Apply to affected areas palms and soles 1-2 times daily until improved. Avoid face, groin, underarms., Disp: 60 g, Rfl: 3   cetirizine (ZYRTEC) 10 MG tablet, Take 10 mg by mouth daily. Reported on 09/12/2015, Disp: , Rfl:    chlorpheniramine-HYDROcodone (TUSSIONEX PENNKINETIC ER) 10-8 MG/5ML SUER, Take 5 mLs by mouth at bedtime as needed., Disp: 115 mL, Rfl: 0   COSENTYX SENSOREADY, 300 MG, 150 MG/ML SOAJ, RX1: INJECT 2 PENS (300 MG) UNDER THE SKIN (SUBCUTANEOUS INJECTION) AT WEEK 3 AND WEEK 4 AS DIRECTED (THEN 2 PENS EVERY 4 WEEKS), Disp: 1 mL, Rfl: 3   dexamethasone (DECADRON) 6 MG tablet, Take 1 tablet (6 mg total) by mouth daily. With food 5-10 days, Disp: 10 tablet, Rfl: 0   ergocalciferol (DRISDOL) 1.25 MG (50000 UT) capsule, Take 1 capsule (  50,000 Units total) by mouth once a week., Disp: 12 capsule, Rfl: 0   famotidine (PEPCID) 40 MG tablet, Take 1 tablet (40 mg total) by mouth at bedtime., Disp: 90 tablet, Rfl: 1   fluticasone (FLONASE) 50 MCG/ACT nasal spray, USE 2 SPRAYS EACH NOSTRIL DAILY, Disp: 16 g, Rfl: 3   hyoscyamine (LEVSIN SL) 0.125 MG SL tablet, PLACE 1 TABLET UNDER THE TONGUE EVERY 4 HOURS AS NEEDED, Disp: 30 tablet, Rfl: 1   pantoprazole (PROTONIX) 40 MG tablet, Take 1 tablet (40 mg total) by mouth daily., Disp: 30 tablet, Rfl: 3   phentermine (ADIPEX-P) 37.5 MG tablet, Take 37.5 mg by mouth every morning., Disp: , Rfl:     promethazine (PHENERGAN) 12.5 MG tablet, TAKE ONE TABLET BY MOUTH EVERY 6 HOURS AS NEEDED FOR NAUSEA AND VOMITING., Disp: 30 tablet, Rfl: 0   propranolol (INDERAL) 10 MG tablet, TAKE 1 TABLET (10 MG TOTAL) BY MOUTH 3 (THREE) TIMES DAILY. AS NEEDED FOR RAPID HEART RATE, Disp: 30 tablet, Rfl: 0   albuterol (VENTOLIN HFA) 108 (90 Base) MCG/ACT inhaler, Inhale 1-2 puffs into the lungs every 4 (four) hours as needed for wheezing or shortness of breath., Disp: 18 g, Rfl: 2  EXAM:  VITALS per patient if applicable:  GENERAL: alert, oriented, appears well and in no acute distress  HEENT: atraumatic, conjunttiva clear, no obvious abnormalities on inspection of external nose and ears  NECK: normal movements of the head and neck  LUNGS: on inspection no signs of respiratory distress, breathing rate appears normal, no obvious gross SOB, gasping or wheezing  CV: no obvious cyanosis  MS: moves all visible extremities without noticeable abnormality  PSYCH/NEURO: pleasant and cooperative, no obvious depression or anxiety, speech and thought processing grossly intact  ASSESSMENT AND PLAN:  Discussed the following assessment and plan:  COVID-19 tested +07/19/20 - Plan: dexamethasone (DECADRON) 6 MG tablet, albuterol (VENTOLIN HFA) 108 (90 Base) MCG/ACT inhaler  Bronchitis due to COVID-19 virus - Plan: azithromycin (ZITHROMAX) 250 MG tablet, dexamethasone (DECADRON) 6 MG tablet 5-10 days, chlorpheniramine-HYDROcodone (TUSSIONEX PENNKINETIC ER) 10-8 MG/5ML SUER, fluconazole (DIFLUCAN) 150 MG tablet with zpack Abx albuterol (VENTOLIN HFA) 108 (90 Base) MCG/ACT inhaler prn  CMA Sherrilee Gilles called infusion line and sent computer message 07/20/20 for patient [phone]  Letter for work to be out 10-14 days   There is no medication other than over the counter meds: Mucinex dm green label for cough. Vitamin C 1000 mg daily. Vitamin D3 4000 Iu (units) daily. Zinc 100 mg daily. Quercetin 250-500  mg 2 times per day  Monitor pulse oximeter, buy from [facility] if oxygen is less than 90 please go to the hospital.   Elderberry syrup Oil of oregano liquid   Are you feeling really sick? Shortness of breath, dizzy, confused cough, chest pain? If so let me know  If worsening, go to hospital [facility]  -we discussed possible serious and likely etiologies, options for evaluation and workup, limitations of telemedicine visit vs in person visit, treatment, treatment risks and precautions.   I discussed the assessment and treatment plan with the patient. The patient was provided an opportunity to ask questions and all were answered. The patient agreed with the plan and demonstrated an understanding of the instructions.    Time spent 20 minutes  Delorise Jackson, MD

## 2020-07-24 NOTE — Telephone Encounter (Signed)
Patient was never called by infusion center but patient is feeling better. Dr. Aundra Dubin is aware.

## 2020-07-24 NOTE — Progress Notes (Signed)
Spoke with pt and she stated that she has not received a call from the infusion center yet. Pt stated that she is getting better just still weak.

## 2020-07-28 ENCOUNTER — Telehealth (INDEPENDENT_AMBULATORY_CARE_PROVIDER_SITE_OTHER): Payer: BC Managed Care – PPO | Admitting: Internal Medicine

## 2020-07-28 ENCOUNTER — Encounter: Payer: Self-pay | Admitting: Internal Medicine

## 2020-07-28 DIAGNOSIS — Z8616 Personal history of COVID-19: Secondary | ICD-10-CM | POA: Insufficient documentation

## 2020-07-28 DIAGNOSIS — U071 COVID-19: Secondary | ICD-10-CM

## 2020-07-28 NOTE — Progress Notes (Signed)
Virtual Visit via CAREGILITY  This visit type was conducted due to national recommendations for restrictions regarding the COVID-19 pandemic (e.g. social distancing).  This format is felt to be most appropriate for this patient at this time.  All issues noted in this document were discussed and addressed.  No physical exam was performed (except for noted visual exam findings with Video Visits).   I connected with@ on 07/28/20 at  4:00 PM EST by a video enabled telemedicine application  and verified that I am speaking with the correct person using two identifiers. Location patient: home Location provider: work or home office Persons participating in the virtual visit: patient, provider  I discussed the limitations, risks, security and privacy concerns of performing an evaluation and management service by telephone and the availability of in person appointments. I also discussed with the patient that there may be a patient responsible charge related to this service. The patient expressed understanding and agreed to proceed.  Reason for visit: FOLLOW UP ON COVID INFECTION   HPI:   SYMPTOMS BEGAN ON jan 4 4 WITH BODY ACHES WHILE AT WORK  AND  FEELING TIRED. LEFT EARLY AT WORK.   FOLLOWED BY INCREASED FATIGUE. FOLLOWED BY DYSGEUSIA.  COUGH WAS MILD.  TESTED ON JAN 5  .  TOOK STEROIDS, ALBUTEROL AND AZITHROMYCIN    RETURNED TO WORK ON JAN 10 .  STILL FATIGUED AND TASTE IS OFF BUT OTHERWISE FINE.   Wondering when she can get the [place].  Advised her to wait until sympotms have completely resolved,  And get it on a Friday in anticpation of body aches, feves that are being increasingly reported.    ROS: See pertinent positives and negatives per HPI.  Past Medical History:  Diagnosis Date  . allergic rhinitis   . Anxiety   . AR (allergic rhinitis)   . Asthma   . Depression   . Endometriosis   . GERD (gastroesophageal reflux disease)   . H/O migraine   . Herniated intervertebral disc    . History of diverticulitis of colon   . Hyperlipidemia   . Hypertension   . IBS (irritable bowel syndrome)   . Irritable bowel syndrome   . Nonobstructive CAD (coronary artery disease)    a. 11/2019 Cor CTA: Cor Ca2+ = 141 (95th %'ile). LAD 25-49p.  . Psoriasis   . Tobacco abuse     Past Surgical History:  Procedure Laterality Date  . ABDOMINAL HYSTERECTOMY  2003   [facility]o  . BREAST BIOPSY Left 08/2014   benign-BENIGN BREAST TISSUE WITH FIBROSIS  . DILATION AND CURETTAGE OF UTERUS      Family History  Problem Relation Age of Onset  . Arthritis Mother   . Hyperlipidemia Mother   . Cancer Father        colon  . Arthritis Maternal Grandmother   . Hyperlipidemia Maternal Grandfather   . Diabetes Brother   . Breast cancer Paternal Aunt 46  . Heart disease Neg Hx   . Colon cancer Neg Hx     SOCIAL HX:  reports that she has quit smoking. Her smoking use included cigarettes. She has a 5.00 pack-year smoking history. She has never used smokeless tobacco. She reports current alcohol use. She reports that she does not use drugs.   Current Outpatient Medications:  .  albuterol (VENTOLIN HFA) 108 (90 Base) MCG/ACT inhaler, Inhale 1-2 puffs into the lungs every 4 (four) hours as needed for wheezing or shortness of breath., Disp: 18 g,  Rfl: 2 .  buPROPion (WELLBUTRIN SR) 200 MG 12 hr tablet, Take 1 tablet (200 mg total) by mouth 2 (two) times daily., Disp: 180 tablet, Rfl: 1 .  busPIRone (BUSPAR) 15 MG tablet, TAKE 1 TABLET (15 MG TOTAL) BY MOUTH 2 (TWO) TIMES DAILY AS NEEDED., Disp: 180 tablet, Rfl: 2 .  Calcipotriene-Betameth Diprop (ENSTILAR) 0.005-0.064 % FOAM, Apply to affected areas palms and soles 1-2 times daily until improved. Avoid face, groin, underarms., Disp: 60 g, Rfl: 3 .  cetirizine (ZYRTEC) 10 MG tablet, Take 10 mg by mouth daily. Reported on 09/12/2015, Disp: , Rfl:  .  chlorpheniramine-HYDROcodone (TUSSIONEX PENNKINETIC ER) 10-8 MG/5ML SUER, Take 5 mLs by mouth at  bedtime as needed., Disp: 115 mL, Rfl: 0 .  COSENTYX SENSOREADY, 300 MG, 150 MG/ML SOAJ, RX1: INJECT 2 PENS (300 MG) UNDER THE SKIN (SUBCUTANEOUS INJECTION) AT WEEK 3 AND WEEK 4 AS DIRECTED (THEN 2 PENS EVERY 4 WEEKS), Disp: 1 mL, Rfl: 3 .  ergocalciferol (DRISDOL) 1.25 MG (50000 UT) capsule, Take 1 capsule (50,000 Units total) by mouth once a week., Disp: 12 capsule, Rfl: 0 .  famotidine (PEPCID) 40 MG tablet, Take 1 tablet (40 mg total) by mouth at bedtime., Disp: 90 tablet, Rfl: 1 .  fluticasone (FLONASE) 50 MCG/ACT nasal spray, USE 2 SPRAYS EACH NOSTRIL DAILY, Disp: 16 g, Rfl: 3 .  hyoscyamine (LEVSIN SL) 0.125 MG SL tablet, PLACE 1 TABLET UNDER THE TONGUE EVERY 4 HOURS AS NEEDED, Disp: 30 tablet, Rfl: 1 .  pantoprazole (PROTONIX) 40 MG tablet, Take 1 tablet (40 mg total) by mouth daily., Disp: 30 tablet, Rfl: 3 .  phentermine (ADIPEX-P) 37.5 MG tablet, Take 37.5 mg by mouth every morning., Disp: , Rfl:  .  promethazine (PHENERGAN) 12.5 MG tablet, TAKE ONE TABLET BY MOUTH EVERY 6 HOURS AS NEEDED FOR NAUSEA AND VOMITING., Disp: 30 tablet, Rfl: 0 .  propranolol (INDERAL) 10 MG tablet, TAKE 1 TABLET (10 MG TOTAL) BY MOUTH 3 (THREE) TIMES DAILY. AS NEEDED FOR RAPID HEART RATE, Disp: 30 tablet, Rfl: 0 .  atorvastatin (LIPITOR) 40 MG tablet, Take 1 tablet (40 mg total) by mouth daily., Disp: 90 tablet, Rfl: 3  EXAM:  VITALS per patient if applicable:  GENERAL: alert, oriented, appears well and in no acute distress  HEENT: atraumatic, conjunttiva clear, no obvious abnormalities on inspection of external nose and ears  NECK: normal movements of the head and neck  LUNGS: on inspection no signs of respiratory distress, breathing rate appears normal, no obvious gross SOB, gasping or wheezing  CV: no obvious cyanosis  MS: moves all visible extremities without noticeable abnormality  PSYCH/NEURO: pleasant and cooperative, no obvious depression or anxiety, speech and thought processing grossly  intact  ASSESSMENT AND PLAN:  Discussed the following assessment and plan:  COVID-19 virus infection  COVID-19 virus infection Diagnosed by  PCR JAN 6.  Symptoms began with body aches and fatigue on Jan 5.  Recovered uneventfully with prednisone, azithromycin and albuterol.  Returned to work per [place] 10.  STILL HAVING FATIGUE AND DYSGEUSIA.     I discussed the assessment and treatment plan with the patient. The patient was provided an opportunity to ask questions and all were answered. The patient agreed with the plan and demonstrated an understanding of the instructions.   The patient was advised to call back or seek an in-person evaluation if the symptoms worsen or if the condition fails to improve as anticipated.   I spent 20 MINUTES  dedicated to the care of this patient on the date of this encounter to include pre-visit review of his medical history,  Face-to-face time with the patient , and post visit ordering of testing and therapeutics.   Crecencio Mc, MD

## 2020-07-28 NOTE — Assessment & Plan Note (Signed)
Diagnosed by  PCR JAN 6.  Symptoms began with body aches and fatigue on Jan 5.  Recovered uneventfully with prednisone, azithromycin and albuterol.  Returned to work per [place] 10.  STILL HAVING FATIGUE AND DYSGEUSIA.

## 2020-07-30 ENCOUNTER — Encounter: Payer: Self-pay | Admitting: Internal Medicine

## 2020-08-01 ENCOUNTER — Ambulatory Visit: Payer: BC Managed Care – PPO

## 2020-08-01 ENCOUNTER — Other Ambulatory Visit: Payer: Self-pay | Admitting: Internal Medicine

## 2020-08-01 ENCOUNTER — Other Ambulatory Visit: Payer: Self-pay

## 2020-08-01 DIAGNOSIS — L400 Psoriasis vulgaris: Secondary | ICD-10-CM

## 2020-08-01 MED ORDER — COSENTYX SENSOREADY (300 MG) 150 MG/ML ~~LOC~~ SOAJ: SUBCUTANEOUS | 5 refills | Status: DC

## 2020-08-03 ENCOUNTER — Telehealth: Payer: Self-pay

## 2020-08-03 ENCOUNTER — Encounter: Payer: Self-pay | Admitting: Internal Medicine

## 2020-08-03 ENCOUNTER — Telehealth (INDEPENDENT_AMBULATORY_CARE_PROVIDER_SITE_OTHER): Payer: BC Managed Care – PPO | Admitting: Internal Medicine

## 2020-08-03 DIAGNOSIS — L403 Pustulosis palmaris et plantaris: Secondary | ICD-10-CM | POA: Diagnosis not present

## 2020-08-03 DIAGNOSIS — J0100 Acute maxillary sinusitis, unspecified: Secondary | ICD-10-CM | POA: Diagnosis not present

## 2020-08-03 MED ORDER — AMOXICILLIN-POT CLAVULANATE 875-125 MG PO TABS: 1.0000 | ORAL_TABLET | Freq: Two times a day (BID) | ORAL | 0 refills | Status: DC

## 2020-08-03 MED ORDER — PREDNISONE 10 MG PO TABS: ORAL_TABLET | ORAL | 0 refills | Status: DC

## 2020-08-03 MED ORDER — FLUCONAZOLE 150 MG PO TABS: 150.0000 mg | ORAL_TABLET | Freq: Every day | ORAL | 0 refills | Status: DC

## 2020-08-03 NOTE — Telephone Encounter (Signed)
Error

## 2020-08-03 NOTE — Progress Notes (Unsigned)
Virtual Visit via CAREGILITY  This visit type was conducted due to national recommendations for restrictions regarding the COVID-19 pandemic (e.g. social distancing).  This format is felt to be most appropriate for this patient at this time.  All issues noted in this document were discussed and addressed.  No physical exam was performed (except for noted visual exam findings with Video Visits).   I connected with@ on 08/03/20 at 11:00 AM EST by a video enabled telemedicine application  and verified that I am speaking with the correct person using two identifiers. Location patient: home Location provider: work or home office Persons participating in the virtual visit: patient, provider  I discussed the limitations, risks, security and privacy concerns of performing an evaluation and management service by telephone and the availability of in person appointments. I also discussed with the patient that there may be a patient responsible charge related to this service. The patient expressed understanding and agreed to proceed. = Reason for visit: POST COVID CHECKUP   HPI:  60 YR OLD FEMALE PRESENTS WITH S/S OF SINUSITIS FOLLOWING COVID INFECTION DIAGNOSED ON Jan 5 after developing symptoms on Jan 4 .  She currently endorses  Sinus congestion and maxillary sinus pain , fatigue and Sneezing.  She returned to week on Jan 10 after her symptoms of fever, malaise and cough resolved. .    ROS: See pertinent positives and negatives per HPI.  Past Medical History:  Diagnosis Date  . allergic rhinitis   . Anxiety   . AR (allergic rhinitis)   . Asthma   . Depression   . Endometriosis   . GERD (gastroesophageal reflux disease)   . H/O migraine   . Herniated intervertebral disc   . History of diverticulitis of colon   . Hyperlipidemia   . Hypertension   . IBS (irritable bowel syndrome)   . Irritable bowel syndrome   . Nonobstructive CAD (coronary artery disease)    a. 11/2019 Cor CTA: Cor Ca2+ =  141 (95th %'ile). LAD 25-49p.  . Psoriasis   . Tobacco abuse     Past Surgical History:  Procedure Laterality Date  . ABDOMINAL HYSTERECTOMY  2003   [facility]huso  . BREAST BIOPSY Left 08/2014   benign-BENIGN BREAST TISSUE WITH FIBROSIS  . DILATION AND CURETTAGE OF UTERUS      Family History  Problem Relation Age of Onset  . Arthritis Mother   . Hyperlipidemia Mother   . Cancer Father        colon  . Arthritis Maternal Grandmother   . Hyperlipidemia Maternal Grandfather   . Diabetes Brother   . Breast cancer Paternal Aunt 31  . Heart disease Neg Hx   . Colon cancer Neg Hx     SOCIAL HX:  reports that she has quit smoking. Her smoking use included cigarettes. She has a 5.00 pack-year smoking history. She has never used smokeless tobacco. She reports current alcohol use. She reports that she does not use drugs.   Current Outpatient Medications:  .  albuterol (VENTOLIN HFA) 108 (90 Base) MCG/ACT inhaler, Inhale 1-2 puffs into the lungs every 4 (four) hours as needed for wheezing or shortness of breath., Disp: 18 g, Rfl: 2 .  amoxicillin-clavulanate (AUGMENTIN) 875-125 MG tablet, Take 1 tablet by mouth 2 (two) times daily., Disp: 14 tablet, Rfl: 0 .  atorvastatin (LIPITOR) 40 MG tablet, Take 1 tablet (40 mg total) by mouth daily., Disp: 90 tablet, Rfl: 3 .  buPROPion (WELLBUTRIN SR) 200 MG 12 hr  tablet, Take 1 tablet (200 mg total) by mouth 2 (two) times daily., Disp: 180 tablet, Rfl: 1 .  busPIRone (BUSPAR) 15 MG tablet, TAKE 1 TABLET (15 MG TOTAL) BY MOUTH 2 (TWO) TIMES DAILY AS NEEDED., Disp: 180 tablet, Rfl: 2 .  Calcipotriene-Betameth Diprop (ENSTILAR) 0.005-0.064 % FOAM, Apply to affected areas palms and soles 1-2 times daily until improved. Avoid face, groin, underarms., Disp: 60 g, Rfl: 3 .  cetirizine (ZYRTEC) 10 MG tablet, Take 10 mg by mouth daily. Reported on 09/12/2015, Disp: , Rfl:  .  COSENTYX SENSOREADY, 300 MG, 150 MG/ML SOAJ, RX1: INJECT 2 PENS (300 MG) UNDER THE SKIN  (SUBCUTANEOUS INJECTION) AT WEEK 3 AND WEEK 4 AS DIRECTED (THEN 2 PENS EVERY 4 WEEKS), Disp: 1 mL, Rfl: 3 .  ergocalciferol (DRISDOL) 1.25 MG (50000 UT) capsule, Take 1 capsule (50,000 Units total) by mouth once a week., Disp: 12 capsule, Rfl: 0 .  famotidine (PEPCID) 40 MG tablet, Take 1 tablet (40 mg total) by mouth at bedtime., Disp: 90 tablet, Rfl: 1 .  fluconazole (DIFLUCAN) 150 MG tablet, Take 1 tablet (150 mg total) by mouth daily., Disp: 2 tablet, Rfl: 0 .  fluticasone (FLONASE) 50 MCG/ACT nasal spray, USE 2 SPRAYS EACH NOSTRIL DAILY, Disp: 16 g, Rfl: 3 .  hyoscyamine (LEVSIN SL) 0.125 MG SL tablet, PLACE 1 TABLET UNDER THE TONGUE EVERY 4 HOURS AS NEEDED, Disp: 30 tablet, Rfl: 1 .  pantoprazole (PROTONIX) 40 MG tablet, Take 1 tablet (40 mg total) by mouth daily., Disp: 30 tablet, Rfl: 3 .  predniSONE (DELTASONE) 10 MG tablet, 6 tablets on Day 1 , then reduce by 1 tablet daily until gone, Disp: 21 tablet, Rfl: 0 .  promethazine (PHENERGAN) 12.5 MG tablet, TAKE ONE TABLET BY MOUTH EVERY 6 HOURS AS NEEDED FOR NAUSEA AND VOMITING., Disp: 30 tablet, Rfl: 0 .  propranolol (INDERAL) 10 MG tablet, TAKE 1 TABLET (10 MG TOTAL) BY MOUTH 3 (THREE) TIMES DAILY. AS NEEDED FOR RAPID HEART RATE, Disp: 30 tablet, Rfl: 0  EXAM:  VITALS per patient if applicable:  GENERAL: alert, oriented, appears well and in no acute distress  HEENT: atraumatic, conjunttiva clear, no obvious abnormalities on inspection of external nose and ears  NECK: normal movements of the head and neck  LUNGS: on inspection no signs of respiratory distress, breathing rate appears normal, no obvious gross SOB, gasping or wheezing  CV: no obvious cyanosis  MS: moves all visible extremities without noticeable abnormality  PSYCH/NEURO: pleasant and cooperative, no obvious depression or anxiety, speech and thought processing grossly intact  ASSESSMENT AND PLAN:  Discussed the following assessment and plan:  Palmoplantar pustular  psoriasis  Acute non-recurrent maxillary sinusitis  Palmoplantar pustular psoriasis MANAGED BY DERMATOLOGY with Cosentyx injections done monthly at home   Sinusitis, acute maxillary Secondary to prolonged infection during viral illness. Prednisone taper,  Empiric antibiotics prescribed.  Probiotic advised.  Fluconazole prescribed for prn use of vaginitis resolved.     I discussed the assessment and treatment plan with the patient. The patient was provided an opportunity to ask questions and all were answered. The patient agreed with the plan and demonstrated an understanding of the instructions.   The patient was advised to call back or seek an in-person evaluation if the symptoms worsen or if the condition fails to improve as anticipated.   I spent  20  minutes dedicated to the care of this patient on the date of this encounter to include pre-visit review of her  medical history,  Face-to-face time with the patient , and post visit ordering of therapeutics.  Crecencio Mc, MD

## 2020-08-03 NOTE — Telephone Encounter (Signed)
Spoke with pt and scheduled her for a follow up virtual appt today at 11am.

## 2020-08-03 NOTE — Assessment & Plan Note (Signed)
MANAGED BY DERMATOLOGY with Cosentyx injections done monthly at home

## 2020-08-03 NOTE — Telephone Encounter (Signed)
error 

## 2020-08-05 DIAGNOSIS — J0100 Acute maxillary sinusitis, unspecified: Secondary | ICD-10-CM | POA: Insufficient documentation

## 2020-08-05 NOTE — Assessment & Plan Note (Addendum)
Secondary to prolonged infection during viral illness. Prednisone taper,  Empiric antibiotics prescribed.  Probiotic advised.  Fluconazole prescribed for prn use of vaginitis resolved.

## 2020-08-17 DIAGNOSIS — M5033 Other cervical disc degeneration, cervicothoracic region: Secondary | ICD-10-CM | POA: Diagnosis not present

## 2020-08-17 DIAGNOSIS — M62830 Muscle spasm of back: Secondary | ICD-10-CM | POA: Diagnosis not present

## 2020-08-17 DIAGNOSIS — M9903 Segmental and somatic dysfunction of lumbar region: Secondary | ICD-10-CM | POA: Diagnosis not present

## 2020-08-17 DIAGNOSIS — M9901 Segmental and somatic dysfunction of cervical region: Secondary | ICD-10-CM | POA: Diagnosis not present

## 2020-09-14 DIAGNOSIS — M5033 Other cervical disc degeneration, cervicothoracic region: Secondary | ICD-10-CM | POA: Diagnosis not present

## 2020-09-14 DIAGNOSIS — M62830 Muscle spasm of back: Secondary | ICD-10-CM | POA: Diagnosis not present

## 2020-09-14 DIAGNOSIS — M9903 Segmental and somatic dysfunction of lumbar region: Secondary | ICD-10-CM | POA: Diagnosis not present

## 2020-09-14 DIAGNOSIS — M9901 Segmental and somatic dysfunction of cervical region: Secondary | ICD-10-CM | POA: Diagnosis not present

## 2020-09-17 ENCOUNTER — Other Ambulatory Visit: Payer: Self-pay | Admitting: Internal Medicine

## 2020-09-25 ENCOUNTER — Other Ambulatory Visit: Payer: Self-pay | Admitting: Internal Medicine

## 2020-10-03 ENCOUNTER — Ambulatory Visit (INDEPENDENT_AMBULATORY_CARE_PROVIDER_SITE_OTHER): Payer: BC Managed Care – PPO

## 2020-10-03 ENCOUNTER — Telehealth: Payer: Self-pay

## 2020-10-03 ENCOUNTER — Ambulatory Visit: Payer: BC Managed Care – PPO | Admitting: Dermatology

## 2020-10-03 ENCOUNTER — Other Ambulatory Visit: Payer: Self-pay

## 2020-10-03 ENCOUNTER — Ambulatory Visit: Payer: BC Managed Care – PPO | Admitting: Family

## 2020-10-03 ENCOUNTER — Encounter: Payer: Self-pay | Admitting: Family

## 2020-10-03 VITALS — BP 118/82 | HR 81 | Temp 97.8°F | Ht 67.0 in | Wt 203.2 lb

## 2020-10-03 DIAGNOSIS — L409 Psoriasis, unspecified: Secondary | ICD-10-CM

## 2020-10-03 DIAGNOSIS — L403 Pustulosis palmaris et plantaris: Secondary | ICD-10-CM

## 2020-10-03 DIAGNOSIS — M79601 Pain in right arm: Secondary | ICD-10-CM | POA: Diagnosis not present

## 2020-10-03 DIAGNOSIS — M25521 Pain in right elbow: Secondary | ICD-10-CM | POA: Diagnosis not present

## 2020-10-03 DIAGNOSIS — M25511 Pain in right shoulder: Secondary | ICD-10-CM | POA: Diagnosis not present

## 2020-10-03 MED ORDER — TAZAROTENE 0.1 % EX CREA: TOPICAL_CREAM | CUTANEOUS | 2 refills | Status: DC

## 2020-10-03 NOTE — Progress Notes (Signed)
   Follow-Up Visit   Subjective  Colleen Washington is a 60 y.o. female who presents for the following: Psoriasis (Hands and feet. Recent flare last week on right hand and both feet, causing pain and throbbing. She is on Cosentyx injections, Enstilar foam, and CeraVe Cream.). Patient has been controlled on meds prior to recent flare.  For the past 2 weeks, patient has been very tired and achy. No sickness or illness diagnosed by PCP.    The following portions of the chart were reviewed this encounter and updated as appropriate:       Review of Systems:  No other skin or systemic complaints except as noted in HPI or Assessment and Plan.  Objective  Well appearing patient in no apparent distress; mood and affect are within normal limits.  A focused examination was performed including hands, feet. Relevant physical exam findings are noted in the Assessment and Plan.  Objective  hands, feet: Diffuse erythema with hyperkeratotic and dried vesicles R plantar > L plantar foot, bil great toes, right palm.   Assessment & Plan  Psoriasis hands, feet  With recent flare  Continue Cosentyx injections q 4 weeks. Continue Enstilar foam qam Aas until improved. Start tazarotene 0.1% cream Apply to Aas hands, feet qhs dsp 60g 2Rf. Continue CeraVe Cream. Discussed adding AmLactin Cream qhs, info given.  Consider adding Soriatane vs MTX to Cosentyx if not improving on f/up with topicals.  Reviewed risks of biologics including immunosuppression, infections, injection site reaction, and failure to improve condition. Goal is control of skin condition, not cure.  Some older biologics such as Humira and Enbrel may slightly increase risk of malignancy and may worsen congestive heart failure. The use of biologics requires long term medication management, including periodic office visits and monitoring of blood work.  Topical steroids (such as triamcinolone, fluocinolone, fluocinonide, mometasone, clobetasol,  halobetasol, betamethasone, hydrocortisone) can cause thinning and lightening of the skin if they are used for too long in the same area. Your physician has selected the right strength medicine for your problem and area affected on the body. Please use your medication only as directed by your physician to prevent side effects.    tazarotene (TAZORAC) 0.1 % cream - hands, feet  Other Related Medications Calcipotriene-Betameth Diprop (ENSTILAR) 0.005-0.064 % FOAM  Return in about 6 weeks (around 11/14/2020) for psoriasis.   IJamesetta Orleans, CMA, am acting as scribe for Brendolyn Patty, MD .  Documentation: I have reviewed the above documentation for accuracy and completeness, and I agree with the above.  Brendolyn Patty MD

## 2020-10-03 NOTE — Assessment & Plan Note (Addendum)
Reduction in rom and onset of pain coincides with lifting 4 weeks ago. Concern for shoulder impingement and/or rotator cuff injury. We will trial aggressive icing regimen, voltaren gel and if no improvement consult orthopedics. Pain closer to elbow itself raises suspicion for with overuse syndrome , question lateral epicondylitis. Pending XRays. Follow up in 6 weeks.

## 2020-10-03 NOTE — Progress Notes (Signed)
Subjective:    Patient ID: Colleen Washington, female    DOB: [birth year], 60 y.o.   MRN: [id number]  CC: Colleen Washington is a 60 y.o. female who presents today for an acute visit.    HPI: Pain under both feet from 'psoriasis flare' describes as yellow flakey patches, worsened over the past week. It is painful to walk and noticed clear drainage.   No fever, swelling . No foot injury. No h/o gout, cellulitis or MRSA. Tried OTC hydrocortisone and enstilar foam without relief. She last saw Dr Nicole Kindred in Oct/2021.   She follows with dermatology, Dr Nicole Kindred. She is compliant with cosentyx monthly for the past year and notes worsening overall of rash. Psoriasis started with flakey patched palm of hands.   She also complains of right elbow and shoulder pain 4 weeks, worsening.She picked up box to put on top shelf and heard a popping sound close to elbow.   Pain radiates down lateral right arm starting at shoulder and she can feel dull ache proximal to right elbow. Occasional numbness in distal fingers, none today.  No ha, vision changes, rash on arm. Painful to raise right arm laterally. She cannot sleep on right arm. She sleeps with arm flexed. Pain resolves with rest; pain exacerbated with lifting laterally, carrying purse, putting backpack on.  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 Using a standup desk. Tried ice , heat, voltaren gel , aleve with temporarily relieve. NSAIDs exacerbate IBS, GERD.  Left handed however she using her right hand for typing.        HISTORY:  Past Medical History:  Diagnosis Date  . allergic rhinitis   . Anxiety   . AR (allergic rhinitis)   . Asthma   . Depression   . Endometriosis   . GERD (gastroesophageal reflux disease)   . H/O migraine   . Herniated intervertebral disc   . History of diverticulitis of colon   . Hyperlipidemia   . Hypertension   . IBS (irritable bowel syndrome)   . Irritable bowel syndrome   .  Nonobstructive CAD (coronary artery disease)    a. 11/2019 Cor CTA: Cor Ca2+ = 141 (95th %'ile). LAD 25-49p.  . Psoriasis   . Tobacco abuse    Past Surgical History:  Procedure Laterality Date  . ABDOMINAL HYSTERECTOMY  2003   [facility]  . BREAST BIOPSY Left 08/2014   benign-BENIGN BREAST TISSUE WITH FIBROSIS  . DILATION AND CURETTAGE OF UTERUS     Family History  Problem Relation Age of Onset  . Arthritis Mother   . Hyperlipidemia Mother   . Cancer Father        colon  . Arthritis Maternal Grandmother   . Hyperlipidemia Maternal Grandfather   . Diabetes Brother   . Breast cancer Paternal Aunt 72  . Heart disease Neg Hx   . Colon cancer Neg Hx     Allergies: Compazine [prochlorperazine] Current Outpatient Medications on File Prior to Visit  Medication Sig Dispense Refill  . albuterol (VENTOLIN HFA) 108 (90 Base) MCG/ACT inhaler Inhale 1-2 puffs into the lungs every 4 (four) hours as needed for wheezing or shortness of breath. 18 g 2  . atorvastatin (LIPITOR) 40 MG tablet Take 1 tablet (40 mg total) by mouth daily. 90 tablet 3  . buPROPion (WELLBUTRIN SR) 200 MG 12 hr tablet Take 1 tablet (200 mg total) by mouth 2 (two) times daily. 180 tablet 1  .  busPIRone (BUSPAR) 15 MG tablet TAKE 1 TABLET (15 MG TOTAL) BY MOUTH 2 (TWO) TIMES DAILY AS NEEDED. 180 tablet 2  . Calcipotriene-Betameth Diprop (ENSTILAR) 0.005-0.064 % FOAM Apply to affected areas palms and soles 1-2 times daily until improved. Avoid face, groin, underarms. 60 g 3  . cetirizine (ZYRTEC) 10 MG tablet Take 10 mg by mouth daily. Reported on 09/12/2015    . COSENTYX SENSOREADY, 300 MG, 150 MG/ML SOAJ RX1: INJECT 2 PENS (300 MG) UNDER THE SKIN (SUBCUTANEOUS INJECTION) AT WEEK 3 AND WEEK 4 AS DIRECTED (THEN 2 PENS EVERY 4 WEEKS) 1 mL 3  . famotidine (PEPCID) 40 MG tablet Take 1 tablet (40 mg total) by mouth at bedtime. 90 tablet 1  . fluticasone (FLONASE) 50 MCG/ACT nasal spray USE 2 SPRAYS EACH NOSTRIL DAILY 16 g 3  .  hyoscyamine (LEVSIN SL) 0.125 MG SL tablet PLACE 1 TABLET UNDER THE TONGUE EVERY 4 HOURS AS NEEDED 30 tablet 1  . pantoprazole (PROTONIX) 40 MG tablet TAKE 1 TABLET BY MOUTH EVERY DAY 90 tablet 3  . promethazine (PHENERGAN) 12.5 MG tablet TAKE ONE TABLET BY MOUTH EVERY 6 HOURS AS NEEDED FOR NAUSEA AND VOMITING. 30 tablet 0  . propranolol (INDERAL) 10 MG tablet TAKE 1 TABLET (10 MG TOTAL) BY MOUTH 3 (THREE) TIMES DAILY. AS NEEDED FOR RAPID HEART RATE 30 tablet 0  . Vitamin D, Ergocalciferol, (DRISDOL) 1.25 MG (50000 UNIT) CAPS capsule TAKE 1 CAPSULE BY MOUTH ONE TIME PER WEEK 12 capsule 0   No current facility-administered medications on file prior to visit.    Social History   Tobacco Use  . Smoking status: Former Smoker    Packs/day: 0.25    Years: 20.00    Pack years: 5.00    Types: Cigarettes  . Smokeless tobacco: Never Used  . Tobacco comment: 2-3 cigarettes daily   Vaping Use  . Vaping Use: Never used  Substance Use Topics  . Alcohol use: Yes    Alcohol/week: 0.0 standard drinks    Comment: occas  . Drug use: No    Review of Systems  Constitutional: Negative for chills and fever.  Eyes: Negative for visual disturbance.  Respiratory: Negative for cough.   Cardiovascular: Negative for chest pain and palpitations.  Gastrointestinal: Negative for nausea and vomiting.  Musculoskeletal: Positive for arthralgias. Negative for joint swelling.  Skin: Positive for rash.  Neurological: Positive for numbness. Negative for dizziness and headaches.      Objective:    BP 118/82   Pulse 81   Temp 97.8 F (36.6 C)   Ht 5\' 7"  (1.702 m)   Wt 203 lb 3.2 oz (92.2 kg)   SpO2 (!) 81%   BMI 31.83 kg/m    Physical Exam Vitals reviewed.  Constitutional:      Appearance: She is well-developed.  Eyes:     Conjunctiva/sclera: Conjunctivae normal.  Cardiovascular:     Rate and Rhythm: Normal rate and regular rhythm.     Pulses: Normal pulses.     Heart sounds: Normal heart sounds.   Pulmonary:     Effort: Pulmonary effort is normal.     Breath sounds: Normal breath sounds. No wheezing, rhonchi or rales.  Musculoskeletal:     Right shoulder: No swelling, tenderness, bony tenderness or crepitus. Decreased range of motion. Normal strength. Normal pulse.     Left shoulder: No swelling, tenderness, bony tenderness or crepitus. Normal strength. Normal pulse.     Right upper arm: No swelling, tenderness or  bony tenderness.     Left upper arm: No tenderness or bony tenderness.     Right elbow: No swelling or effusion. Normal range of motion. No tenderness.     Left elbow: No swelling or effusion. Normal range of motion. No tenderness.     Right forearm: No swelling.     Left forearm: No swelling.     Right wrist: No swelling or tenderness. Normal range of motion.     Left wrist: No swelling or tenderness. Normal range of motion.     Right hand: No swelling or tenderness. Normal range of motion.     Left hand: No swelling or tenderness. Normal range of motion.     Cervical back: Normal. No bony tenderness. No pain with movement.     Comments: Right Shoulder:   No asymmetry of shoulders when comparing right and left.No pain with palpation over glenohumeral joint lines, [place] joint, AC joint, or bicipital groove. No pain with internal and external rotation. Pain with resisted lateral extension and reduced rom with lateral extension.   Negative drop arm.   No pain, swelling, or ecchymosis noted over long head of biceps.  No bicep pain with resisted flexion and extension.    Right elbow: Full ROM with flexion, extension, supination, and pronation.  Strength 5/5. No ecchymosis or swelling.  Negative phalen, tinels sign.   Skin:    General: Skin is warm and dry.  Neurological:     Mental Status: She is alert.  Psychiatric:        Speech: Speech normal.        Behavior: Behavior normal.        Thought Content: Thought content normal.          Assessment & Plan:    Problem List Items Addressed This Visit      Musculoskeletal and Integument   Palmoplantar pustular psoriasis    Patient appears to be suffering from psoriasis despite enstilar, cosentyx. we were able to schedule an appointment Dr Nicole Kindred for further evaluation for this afternoon.         Other   Right arm pain - Primary    Reduction in rom and onset of pain coincides with lifting 4 weeks ago. Concern for shoulder impingement and/or rotator cuff injury. We will trial aggressive icing regimen, voltaren gel and if no improvement consult orthopedics. Pain closer to elbow itself raises suspicion for with overuse syndrome , question lateral epicondylitis. Pending XRays. Follow up in 6 weeks.       Relevant Orders   DG Shoulder Right   DG Elbow Complete Right        I have discontinued Colleen Washington "Maria"'s amoxicillin-clavulanate, predniSONE, and fluconazole. I am also having her maintain her cetirizine, hyoscyamine, fluticasone, busPIRone, propranolol, atorvastatin, Cosentyx Sensoready (300 MG), Enstilar, famotidine, buPROPion, albuterol, promethazine, Vitamin D (Ergocalciferol), and pantoprazole.   No orders of the defined types were placed in this encounter.   Return precautions given.   Risks, benefits, and alternatives of the medications and treatment plan prescribed today were discussed, and patient expressed understanding.   Education regarding symptom management and diagnosis given to patient on AVS.  Continue to follow with Crecencio Mc, MD for routine health maintenance.   [name] and I agreed with plan.   Mable Paris, FNP

## 2020-10-03 NOTE — Patient Instructions (Addendum)
Appointment with  Dr Nicole Kindred today.  For right shoulder and right elbow pain. I suspect overuse syndrome from lifting, typing.    Icing for 20 minutes at least 2 times per day. Use voltaren gel.    If not improvement , please let me know right away as I would like to consult orthopedics.      Elbow and Forearm Exercises Ask your health care provider which exercises are safe for you. Do exercises exactly as told by your health care provider and adjust them as directed. It is normal to feel mild stretching, pulling, tightness, or discomfort as you do these exercises. Stop right away if you feel sudden pain or your pain gets worse. Do not begin these exercises until told by your health care provider. Range-of-motion exercises These exercises warm up your muscles and joints and improve the movement and flexibility of your injured elbow and forearm. The exercises also help to relieve pain, numbness, and tingling. These exercises are done using the muscles in your injured elbow and forearm (active). Elbow flexion, active 1. Hold your left / right arm at your side, and bend your elbow (flexion) as far as you can using only your arm muscles. 2. Hold this position for __________ seconds. 3. Slowly return to the starting position. Repeat __________ times. Complete this exercise __________ times a day. Elbow extension, active 1. Hold your left / right arm at your side, and straighten your elbow (extension) as much as you can using only your arm muscles. 2. Hold this position for __________ seconds. 3. Slowly return to the starting position. Repeat __________ times. Complete this exercise __________ times a day. Active forearm rotation, supination This is an exercise in which you turn (rotate) your forearm palm up (supination). 1. Stand or sit with your elbows at your sides. 2. Bend your left / right elbow to a 90-degree angle (right angle). 3. Rotate your palm up until you feel a gentle stretch  on the inside of your forearm. 4. Hold this position for __________ seconds. 5. Slowly return to the starting position. Repeat __________ times. Complete this exercise __________ times a day. Active forearm rotation, pronation This is an exercise in which you turn (rotate) your forearm palm down (pronation). 1. Stand or sit with your elbows at your sides. 2. Bend your left / right elbow to a 90-degree angle (right angle). 3. Rotate your palm down until you feel a gentle stretch on the top of your forearm. 4. Hold this position for __________ seconds. 5. Slowly return to the starting position. Repeat __________ times. Complete this exercise __________ times a day. Stretching exercises These exercises warm up your muscles and joints and improve the movement and flexibility of your injured elbow and forearm. These exercises also help to relieve pain, numbness, and tingling. These exercises are done using your healthy elbow and forearm to help stretch the muscles in your injured elbow and forearm (active-assisted). Elbow flexion, active-assisted 1. Hold your left / right arm at your side, and bend your elbow (flexion) as much as you can using your left / right arm muscles. 2. Use your other hand to bend your left / right elbow farther. To do this, gently push up on your forearm until you feel a gentle stretch on the back of your elbow. 3. Hold this position for __________ seconds. 4. Slowly return to the starting position. Repeat __________ times. Complete this exercise __________ times a day.   Elbow extension, active-assisted 1. Hold your left /  right arm at your side, and straighten your elbow (extension) as much as you can using your left / right arm muscles. 2. Use your other hand to straighten the left / right elbow farther. To do this, gently push down on your forearm until you feel a gentle stretch on the inside of your elbow. 3. Hold this position for __________ seconds. 4. Slowly return  to the starting position. Repeat __________ times. Complete this exercise __________ times a day.   Active-assisted forearm rotation, supination This is an exercise in which you turn (rotate) your forearm palm up (supination). 1. Sit with your left / right elbow bent in a 90-degree angle (right angle) with your forearm resting on a table. 2. Keeping your upper body and shoulder still, rotate your forearm so your palm faces upward. 3. Use your other hand to help rotate your forearm further until you feel a gentle to moderate stretch. 4. Hold this position for __________ seconds. 5. Slowly release the stretch and return to the starting position. Repeat __________ times. Complete this exercise __________ times a day. Active-assisted forearm rotation, pronation This is an exercise in which you turn (rotate) your forearm palm down (pronation). 1. Sit with your left / right elbow bent in a 90-degree angle (right angle) with your forearm resting on a table. 2. Keeping your upper body and shoulder still, rotate your forearm so your palm faces the tabletop. 3. Use your other hand to help rotate your forearm further until you feel a gentle to moderate stretch. 4. Hold this position for __________ seconds. 5. Slowly release the stretch and return to the starting position. Repeat __________ times. Complete this exercise __________ times a day.   Passive elbow flexion, supine 1. Lie on your back (supine position). 2. Extend your left / right arm up in the air, bracing it with your other hand. 3. Let your left / right hand slowly lower toward your shoulder (passive flexion), while your elbow stays pointed toward the ceiling. You should feel a gentle stretch along the back of your upper arm and elbow. 4. If instructed by your health care provider, you may increase the intensity of your stretch by adding a small wrist weight or hand weight. 5. Hold this position for __________ seconds. 6. Slowly return to  the starting position. Repeat __________ times. Complete this exercise __________ times a day. Passive elbow extension, supine 1. Lie on your back (supine position). Make sure that you are in a comfortable position that lets you relax your arm muscles. 2. Place a folded towel under your left / right upper arm so your elbow and shoulder are at the same height. Straighten your left / right arm so your elbow does not rest on the bed or towel. 3. Let the weight of your hand stretch your elbow (passive extension). Keep your arm and chest muscles relaxed. You should feel a stretch on the inside of your elbow. 4. If told by your health care provider, you may increase the intensity of your stretch by adding a small wrist weight or hand weight. 5. Hold this position for __________ seconds. 6. Slowly release the stretch. Repeat __________ times. Complete this exercise __________ times a day.   Strengthening exercises These exercises build strength and endurance in your elbow and forearm. Endurance is the ability to use your muscles for a long time, even after they get tired. Elbow flexion, isometric 1. Stand or sit up straight. 2. Bend your left / right elbow in a  90-degree angle (right angle), and keep your forearm at the height of your waist. Your thumb should be pointed toward the ceiling (neutral forearm). 3. Place your other hand on top of your left / right forearm. Gently push down while you resist with your left / right arm (isometric flexion). Push as hard as you can with both arms without causing any pain or movement at your left / right elbow. 4. Hold this position for __________ seconds. 5. Slowly release the tension in both arms. Let your muscles relax completely before you repeat the exercise. Repeat __________ times. Complete this exercise __________ times a day.   Elbow extension, isometric 1. Stand or sit up straight. 2. Place your left / right arm so your palm faces your abdomen and is at  the height of your waist. 3. Place your other hand on the underside of your left / right forearm. Gently push up while you resist with your left / right arm (isometric extension). Push as hard as you can with both arms without causing any pain or movement at your left / right elbow. 4. Hold this position for __________ seconds. 5. Slowly release the tension in both arms. Let your muscles relax completely before you repeat the exercise. Repeat __________ times. Complete this exercise __________ times a day.   Elbow flexion with forearm palm up 1. Sit on a firm chair without armrests, or stand up. 2. Place your left / right arm at your side with your elbow straight and your palm facing forward. 3. Holding a __________weight or gripping a rubber exercise band or tubing, bend your elbow to bring your hand toward your shoulder (flexion). 4. Hold this position for __________ seconds. 5. Slowly return to the starting position. Repeat __________ times. Complete this exercise __________ times a day.   Elbow extension, active 1. Sit on a firm chair without armrests, or stand up. 2. Hold a rubber exercise band or tubing in both hands. 3. Keeping your upper arms at your sides, bring both hands up to your left / right shoulder. Keep your left / right hand just below your other hand. 4. Straighten your left / right elbow (extension) while keeping your other arm still. 5. Hold this position for __________ seconds. 6. Control the resistance of the band or tubing as you return to the starting position. Repeat __________ times. Complete this exercise __________ times a day.   Forearm rotation, supination 1. Sit with your left / right forearm supported on a table. Your elbow should be at waist height and bent at a 90-degree angle (right angle). 2. Gently grasp a lightweight hammer. 3. Rest your hand over the edge of the table with your palm facing down. 4. Without moving your left / right elbow, slowly rotate  your forearm to turn your palm up toward the ceiling (supination). 5. Hold this position for __________ seconds. 6. Slowly return to the starting position. Repeat __________ times. Complete this exercise __________ times a day.   Forearm rotation, pronation 1. Sit with your left / right forearm supported on a table. Keep your elbow below shoulder height. 2. Gently grasp a lightweight hammer. 3. Rest your hand over the edge of the table with your palm facing up. 4. Without moving your left / right elbow, slowly rotate your forearm to turn your palm down toward the floor (pronation). 5. Hold this position for __________seconds. 6. Slowly return to the starting position. Repeat __________ times. Complete this exercise __________ times a day.  This information is not intended to replace advice given to you by your health care provider. Make sure you discuss any questions you have with your health care provider. Document Revised: 10/22/2018 Document Reviewed: 07/22/2018 Elsevier Patient Education  [employer].

## 2020-10-03 NOTE — Telephone Encounter (Signed)
I saw her today and we discussed additional treatment options.

## 2020-10-03 NOTE — Patient Instructions (Signed)
Enstilar Foam - Apply to psoriasis every morning as needed until improved.  tazorotene cream - Apply to psoriasis every night as needed until improved. May use with CeraVe Cream or AmLactin Cream.

## 2020-10-03 NOTE — Telephone Encounter (Signed)
Fax from pharmacy stating that pt is having persistent flares on Cosentyx.

## 2020-10-03 NOTE — Assessment & Plan Note (Signed)
Patient appears to be suffering from psoriasis despite enstilar, cosentyx. we were able to schedule an appointment Dr Nicole Kindred for further evaluation for this afternoon.

## 2020-10-06 ENCOUNTER — Other Ambulatory Visit: Payer: Self-pay | Admitting: Family

## 2020-10-06 DIAGNOSIS — R9389 Abnormal findings on diagnostic imaging of other specified body structures: Secondary | ICD-10-CM

## 2020-10-16 ENCOUNTER — Ambulatory Visit: Payer: BC Managed Care – PPO | Admitting: Dermatology

## 2020-10-18 ENCOUNTER — Other Ambulatory Visit: Payer: Self-pay | Admitting: Internal Medicine

## 2020-10-18 ENCOUNTER — Encounter: Payer: Self-pay | Admitting: Family

## 2020-10-18 DIAGNOSIS — U071 COVID-19: Secondary | ICD-10-CM

## 2020-10-18 DIAGNOSIS — J40 Bronchitis, not specified as acute or chronic: Secondary | ICD-10-CM

## 2020-10-24 ENCOUNTER — Encounter: Payer: Self-pay | Admitting: Internal Medicine

## 2020-10-26 ENCOUNTER — Ambulatory Visit (INDEPENDENT_AMBULATORY_CARE_PROVIDER_SITE_OTHER): Payer: BC Managed Care – PPO | Admitting: Internal Medicine

## 2020-10-26 ENCOUNTER — Other Ambulatory Visit: Payer: Self-pay

## 2020-10-26 ENCOUNTER — Encounter: Payer: Self-pay | Admitting: Internal Medicine

## 2020-10-26 ENCOUNTER — Ambulatory Visit: Payer: BC Managed Care – PPO | Admitting: Internal Medicine

## 2020-10-26 VITALS — BP 98/68 | HR 90 | Temp 97.9°F | Resp 16 | Ht 67.0 in | Wt 203.0 lb

## 2020-10-26 DIAGNOSIS — J410 Simple chronic bronchitis: Secondary | ICD-10-CM | POA: Diagnosis not present

## 2020-10-26 DIAGNOSIS — E669 Obesity, unspecified: Secondary | ICD-10-CM

## 2020-10-26 DIAGNOSIS — I471 Supraventricular tachycardia: Secondary | ICD-10-CM | POA: Diagnosis not present

## 2020-10-26 DIAGNOSIS — N951 Menopausal and female climacteric states: Secondary | ICD-10-CM

## 2020-10-26 DIAGNOSIS — F331 Major depressive disorder, recurrent, moderate: Secondary | ICD-10-CM

## 2020-10-26 DIAGNOSIS — F410 Panic disorder [episodic paroxysmal anxiety] without agoraphobia: Secondary | ICD-10-CM

## 2020-10-26 DIAGNOSIS — L403 Pustulosis palmaris et plantaris: Secondary | ICD-10-CM

## 2020-10-26 MED ORDER — HYDROXYZINE PAMOATE 25 MG PO CAPS: 25.0000 mg | ORAL_CAPSULE | Freq: Three times a day (TID) | ORAL | 0 refills | Status: DC | PRN

## 2020-10-26 MED ORDER — SPIRONOLACTONE 25 MG PO TABS: 25.0000 mg | ORAL_TABLET | Freq: Every day | ORAL | 3 refills | Status: DC

## 2020-10-26 NOTE — Patient Instructions (Addendum)
Trial of hydroxyzine for itching.  25 mg starting dose,  May increase to 50 mg if needed.  Every ever 6 to 8 hours   Your diet is fine, unless there are hidden calories .  And you need more exercise !   Increase wellbutrin to 300 mg daily   Start taking the 1/2 tablet of buspirone two times daily to manage anxiety    rtc one month

## 2020-10-26 NOTE — Progress Notes (Signed)
Subjective:  Patient ID: Colleen Washington, female    DOB: October 01, 1960  Age: 60 y.o. MRN: [id number]  CC: The primary encounter diagnosis was Moderate episode of recurrent major depressive disorder ([place]). 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  HPI Colleen Washington presents for management of pain and depression    This visit occurred during the SARS-CoV-2 public health emergency.  Safety protocols were in place, including screening questions prior to the visit, additional usage of staff PPE, and extensive cleaning of exam room while observing appropriate contact time as indicated for disinfecting solutions.   [name] has been  diagnosed with psoriasis by Brendolyn Patty.  She has been started on biologics (CoSentyx injections ) . Her last OV was reviewed and noted a recent flare which was treated with the following plan:  Continue Cosentyx injections q 4 weeks. Continue Enstilar foam qam Aas until improved. Start tazarotene 0.1% cream Apply to  hands, feet qhs dsp 60g 2Rf. Continue CeraVe Cream. Discussed adding AmLactin Cream qhs, info given.  Consider adding Soriatane vs MTX to Cosentyx if not improving on f/up with topicals.  Follow up appt is May 3   Cc: is itching "so bad" she wants to scratch until she bleeds.  Has tried benadryl but has not tried hydroxyzine   Positive depression screen.  She is tearful today.  She has multiple complaints and is frustrated by the multiple issues she has not been able to get under control:  1) Can't lose weight , weight fluctuates:  Has days where pants fit  , other days she can't button them.  Bowels moving regularly since starting a probiotic 6 months ago.   Diet reviewed.  Intermittent diet tried: 11 am  Salad with grilled chicken,  Adds chia seeds and uses a vinegar based dressing.  .  Dinner is a weight watcher approved meal .  Doesn't snack after dinner; just goes  to bed when she feels hungry . Not exercising because she walks a lot at work.  Feels like she might be retaining water.   2) Menopause symptoms have returned.  Hot flashes,  Mood swings.   3) Depression  Taking wellbutrin 200 mg twice daily made her too high strung .  Taking  150 mg once daily . Having   HAVING HOT FLASHES    Outpatient Medications Prior to Visit  Medication Sig Dispense Refill  . albuterol (VENTOLIN HFA) 108 (90 Base) MCG/ACT inhaler INHALE 1-2 PUFFS INTO THE LUNGS EVERY 4 HOURS AS NEEDED FOR WHEEZING OR SHORTNESS OF BREATH. 18 each 2  . atorvastatin (LIPITOR) 40 MG tablet Take 1 tablet (40 mg total) by mouth daily. 90 tablet 3  . buPROPion (WELLBUTRIN SR) 200 MG 12 hr tablet Take 1 tablet (200 mg total) by mouth 2 (two) times daily. 180 tablet 1  . busPIRone (BUSPAR) 15 MG tablet TAKE 1 TABLET (15 MG TOTAL) BY MOUTH 2 (TWO) TIMES DAILY AS NEEDED. 180 tablet 2  . cetirizine (ZYRTEC) 10 MG tablet Take 10 mg by mouth daily. Reported on 09/12/2015    . COSENTYX SENSOREADY, 300 MG, 150 MG/ML SOAJ RX1: INJECT 2 PENS (300 MG) UNDER THE SKIN (SUBCUTANEOUS INJECTION) AT WEEK 3 AND WEEK 4 AS DIRECTED (THEN 2 PENS EVERY 4 WEEKS) 1 mL  3  . famotidine (PEPCID) 40 MG tablet Take 1 tablet (40 mg total) by mouth at bedtime. 90 tablet 1  . fluticasone (FLONASE) 50 MCG/ACT nasal spray USE 2 SPRAYS EACH NOSTRIL DAILY 16 g 3  . hyoscyamine (LEVSIN SL) 0.125 MG SL tablet PLACE 1 TABLET UNDER THE TONGUE EVERY 4 HOURS AS NEEDED 30 tablet 1  . pantoprazole (PROTONIX) 40 MG tablet TAKE 1 TABLET BY MOUTH EVERY DAY 90 tablet 3  . promethazine (PHENERGAN) 12.5 MG tablet TAKE ONE TABLET BY MOUTH EVERY 6 HOURS AS NEEDED FOR NAUSEA AND VOMITING. 30 tablet 0  . propranolol (INDERAL) 10 MG tablet  TAKE 1 TABLET (10 MG TOTAL) BY MOUTH 3 (THREE) TIMES DAILY. AS NEEDED FOR RAPID HEART RATE 30 tablet 0  . Vitamin D, Ergocalciferol, (DRISDOL) 1.25 MG (50000 UNIT) CAPS capsule TAKE 1 CAPSULE BY MOUTH ONE TIME PER WEEK 12 capsule 0  . Calcipotriene-Betameth Diprop (ENSTILAR) 0.005-0.064 % FOAM Apply to affected areas palms and soles 1-2 times daily until improved. Avoid face, groin, underarms. (Patient not taking: Reported on 10/26/2020) 60 g 3  . tazarotene (TAZORAC) 0.1 % cream Apply to hands and feet every night as needed for psoriasis. (Patient not taking: Reported on 10/26/2020) 60 g 2   No facility-administered medications prior to visit.    Review of Systems;  Patient denies headache, fevers, malaise, unintentional weight loss,eye pain, sinus congestion and sinus pain, sore throat, dysphagia,  hemoptysis , cough, dyspnea, wheezing, chest pain, palpitations, orthopnea, edema, abdominal pain, nausea, melena, diarrhea, constipation, flank pain, dysuria, hematuria, urinary  Frequency, nocturia, numbness, tingling, seizures,  Focal weakness, Loss of consciousness,  Tremor, insomnia,  and suicidal ideation.      Objective:  BP 98/68 (BP Location: Left Arm, Patient Position: Sitting, Cuff Size: Large)   Pulse 90   Temp 97.9 F (36.6 C) (Oral)   Resp 16   Ht 5\' 7"  (1.702 m)   Wt 203 lb (92.1 kg)   SpO2 98%   BMI 31.79 kg/m   BP Readings from Last 3 Encounters:  10/26/20 98/68  10/03/20 118/82  06/28/20 (!) 110/92    Wt Readings from Last 3 Encounters:  10/26/20 203 lb (92.1 kg)  10/03/20 203 lb 3.2 oz (92.2 kg)  08/03/20 198 lb (89.8 kg)    General appearance: alert, cooperative and appears stated age Ears: normal TM's and external ear canals both ears Throat: lips, mucosa, and tongue normal; teeth and gums normal Neck: no adenopathy, no carotid bruit, supple, symmetrical, trachea midline and thyroid not enlarged, symmetric, no tenderness/mass/nodules Back: symmetric, no  curvature. ROM normal. No CVA tenderness. Lungs: clear to auscultation bilaterally Heart: regular rate and rhythm, S1, S2 normal, no murmur, click, rub or gallop Abdomen: soft, non-tender; bowel sounds normal; no masses,  no organomegaly Pulses: 2+ and symmetric Skin: Skin color, texture, turgor normal. No rashes or lesions Lymph nodes: Cervical, supraclavicular, and axillary nodes normal.  Lab Results  Component Value Date   HGBA1C 5.2 10/14/2016    Lab Results  Component Value Date   CREATININE 0.91 06/28/2020   CREATININE 0.92 11/19/2019   CREATININE 0.87 08/12/2018    Lab Results  Component Value Date   WBC 5.3 06/28/2020   HGB 13.3 06/28/2020   HCT 39.5 06/28/2020   PLT 167.0 06/28/2020   GLUCOSE 85 06/28/2020   CHOL 171 06/28/2020   TRIG 131.0 06/28/2020   HDL 52.30 06/28/2020   LDLCALC 92 06/28/2020   ALT 15 06/28/2020   AST  13 06/28/2020   NA 139 06/28/2020   K 3.9 06/28/2020   CL 104 06/28/2020   CREATININE 0.91 06/28/2020   BUN 11 06/28/2020   CO2 28 06/28/2020   TSH 1.42 06/28/2020   HGBA1C 5.2 10/14/2016    No results found.  Assessment & Plan:   Problem List Items Addressed This Visit      Unprioritized   Symptoms, such as flushing, sleeplessness, headache, lack of concentration, associated with the menopause    Will reassess addressing depression and anxiety with scheduled wellbutrin and buspirone.  May consider adding lexapro vs HRT depending on how abstinent she has been from tobacco       SVT (supraventricular tachycardia) (HCC)   Relevant Medications   spironolactone (ALDACTONE) 25 MG tablet   Panic disorder without agoraphobia with mild panic attacks    Continue buspirone 15 mg bid       Relevant Medications   hydrOXYzine (VISTARIL) 25 MG capsule   Palmoplantar pustular psoriasis    Adding hydroxyzine for management of itching.  Follow up with Dr Nicole Kindred May 3 for alternative drug therapy if Consentyx and topicals do not relieve  symptoms       Obesity (BMI 30.0-34.9)    Reviewed her diet which seems fine; advised to assess for hidden high caloriefoods (chias seeds?)  And address the lack of exercise with  30 minutes of actula cardio (not walking).  I provided 40 minutes during this encounter reviewing patient's current problems providing counseling on the above mentioned issues in a face to face visit  , and coordination  of care .      Moderate episode of recurrent major depressive disorder ([facility]) - Primary   Relevant Medications   hydrOXYzine (VISTARIL) 25 MG capsule   Bronchitis, chronic ([place])      I am having Carys M. Ovid Curd "[name]" start on hydrOXYzine and spironolactone. I am also having her maintain her cetirizine, hyoscyamine, fluticasone, busPIRone, propranolol, atorvastatin, Cosentyx Sensoready (300 MG), Enstilar, famotidine, buPROPion, promethazine, Vitamin D (Ergocalciferol), pantoprazole, tazarotene, and albuterol.  Meds ordered this encounter  Medications  . hydrOXYzine (VISTARIL) 25 MG capsule    Sig: Take 1 capsule (25 mg total) by mouth every 8 (eight) hours as needed.    Dispense:  60 capsule    Refill:  0  . spironolactone (ALDACTONE) 25 MG tablet    Sig: Take 1 tablet (25 mg total) by mouth daily.    Dispense:  90 tablet    Refill:  3    There are no discontinued medications.  Follow-up: No follow-ups on file.   Crecencio Mc, MD

## 2020-10-29 ENCOUNTER — Encounter: Payer: Self-pay | Admitting: Internal Medicine

## 2020-10-29 DIAGNOSIS — F331 Major depressive disorder, recurrent, moderate: Secondary | ICD-10-CM | POA: Insufficient documentation

## 2020-10-29 NOTE — Assessment & Plan Note (Signed)
Reviewed her diet which seems fine; advised to assess for hidden high caloriefoods (chias seeds?)  And address the lack of exercise with  30 minutes of actula cardio (not walking).  I provided 40 minutes during this encounter reviewing patient's current problems providing counseling on the above mentioned issues in a face to face visit  , and coordination  of care .

## 2020-10-29 NOTE — Assessment & Plan Note (Signed)
Will reassess addressing depression and anxiety with scheduled wellbutrin and buspirone.  May consider adding lexapro vs HRT depending on how abstinent she has been from tobacco

## 2020-10-29 NOTE — Assessment & Plan Note (Signed)
Continue buspirone 15 mg bid

## 2020-10-29 NOTE — Assessment & Plan Note (Signed)
Adding hydroxyzine for management of itching.  Follow up with Dr Nicole Kindred May 3 for alternative drug therapy if Consentyx and topicals do not relieve symptoms

## 2020-11-09 DIAGNOSIS — M9903 Segmental and somatic dysfunction of lumbar region: Secondary | ICD-10-CM | POA: Diagnosis not present

## 2020-11-09 DIAGNOSIS — M5033 Other cervical disc degeneration, cervicothoracic region: Secondary | ICD-10-CM | POA: Diagnosis not present

## 2020-11-09 DIAGNOSIS — M9901 Segmental and somatic dysfunction of cervical region: Secondary | ICD-10-CM | POA: Diagnosis not present

## 2020-11-09 DIAGNOSIS — M62830 Muscle spasm of back: Secondary | ICD-10-CM | POA: Diagnosis not present

## 2020-11-13 ENCOUNTER — Other Ambulatory Visit: Payer: Self-pay | Admitting: Internal Medicine

## 2020-11-14 ENCOUNTER — Ambulatory Visit: Payer: BC Managed Care – PPO | Admitting: Internal Medicine

## 2020-11-15 ENCOUNTER — Ambulatory Visit: Payer: BC Managed Care – PPO | Admitting: Internal Medicine

## 2020-11-17 ENCOUNTER — Other Ambulatory Visit: Payer: Self-pay

## 2020-11-17 MED ORDER — BUSPIRONE HCL 15 MG PO TABS: 15.0000 mg | ORAL_TABLET | Freq: Two times a day (BID) | ORAL | 2 refills | Status: DC | PRN

## 2020-11-17 MED ORDER — BUPROPION HCL ER (SR) 200 MG PO TB12: 200.0000 mg | ORAL_TABLET | Freq: Two times a day (BID) | ORAL | 1 refills | Status: DC

## 2020-11-22 MED ORDER — BUPROPION HCL ER (XL) 300 MG PO TB24: 300.0000 mg | ORAL_TABLET | Freq: Every day | ORAL | 2 refills | Status: DC

## 2020-11-28 ENCOUNTER — Ambulatory Visit: Payer: BC Managed Care – PPO | Admitting: Dermatology

## 2020-11-28 ENCOUNTER — Other Ambulatory Visit: Payer: Self-pay

## 2020-11-28 DIAGNOSIS — L409 Psoriasis, unspecified: Secondary | ICD-10-CM | POA: Diagnosis not present

## 2020-11-28 MED ORDER — HALOBETASOL PROPIONATE 0.05 % EX CREA: TOPICAL_CREAM | CUTANEOUS | 2 refills | Status: DC

## 2020-11-28 MED ORDER — ACITRETIN 25 MG PO CAPS: 25.0000 mg | ORAL_CAPSULE | Freq: Every day | ORAL | 1 refills | Status: DC

## 2020-11-28 NOTE — Patient Instructions (Addendum)
In AM: Cont Enstilar foam Start Zeasorb AF powder to feet Start Soriatane 25mg  take 1 capsule every other day (Prescription written daily)  In PM: Mix tazarotene cream and halobetasol cream.    If you have any questions or concerns for your doctor, please call our main line at [phone] and press option 4 to reach your doctor's medical assistant. If no one answers, please leave a voicemail as directed and we will return your call as soon as possible. Messages left after 4 pm will be answered the following business day.   You may also send [place] a message via [place]. We typically respond to MyChart messages within 1-2 business days.  For prescription refills, please ask your pharmacy to contact our office. Our fax number is [phone].  If you have an urgent issue when the clinic is closed that cannot wait until the next business day, you can page your doctor at the number below.    Please note that while we do our best to be available for urgent issues outside of office hours, we are not available 24/7.   If you have an urgent issue and are unable to reach [place], you may choose to seek medical care at your doctor's office, retail clinic, urgent care center, or emergency room.  If you have a medical emergency, please immediately call 911 or go to the emergency department.  Pager Numbers  - Dr. Nehemiah Massed: [phone]  - Dr. Laurence Ferrari: [phone]  - Dr. Nicole Kindred: [phone]  In the event of inclement weather, please call our main line at [phone] for an update on the status of any delays or closures.  Dermatology Medication Tips: Please keep the boxes that topical medications come in in order to help keep track of the instructions about where and how to use these. Pharmacies typically print the medication instructions only on the boxes and not directly on the medication tubes.   If your medication is too expensive, please contact our office at [phone] option 4 or send [place] a  message through [place].   We are unable to tell what your co-pay for medications will be in advance as this is different depending on your insurance coverage. However, we may be able to find a substitute medication at lower cost or fill out paperwork to get insurance to cover a needed medication.   If a prior authorization is required to get your medication covered by your insurance company, please allow [place] 1-2 business days to complete this process.  Drug prices often vary depending on where the prescription is filled and some pharmacies may offer cheaper prices.  The website www.goodrx.com contains coupons for medications through different pharmacies. The prices here do not account for what the cost may be with help from insurance (it may be cheaper with your insurance), but the website can give you the price if you did not use any insurance.  - You can print the associated coupon and take it with your prescription to the pharmacy.  - You may also stop by our office during regular business hours and pick up a GoodRx coupon card.  - If you need your prescription sent electronically to a different pharmacy, notify our office through [facility] or by phone at [phone] option 4.

## 2020-11-28 NOTE — Progress Notes (Signed)
Follow-Up Visit   Subjective  Colleen Washington is a 60 y.o. female who presents for the following: Psoriasis (Hands and feet, 6 week follow-up. Hands are improved, but feet are still very itchy. She is on Cosentyx injections, Enstilar Foam qam, and Tazarotene 0.1% Cream. ).  Tazarotene makes her itch more.  Still not doing very well.   The following portions of the chart were reviewed this encounter and updated as appropriate:       Review of Systems:  No other skin or systemic complaints except as noted in HPI or Assessment and Plan.  Objective  Well appearing patient in no apparent distress; mood and affect are within normal limits.  A focused examination was performed including hands, feet. Relevant physical exam findings are noted in the Assessment and Plan.  Objective  hands, feet: Erythema with diffuse hyperkeratosis on right plantar foot,  focal erythema/scale on left plantar foot and great toe.  Images         Assessment & Plan  Psoriasis hands, feet  Palmarplantar- Hands improved. Not much improvement on feet.  Discussed adding Soriatane vs switching biologic. Patient prefers to add Soriatane to current treatment.  Discussed potential side effects Reviewed potential side effects of acitretin including xerosis, cheilitis, hepatitis, hyperlipidemia, and severe birth defects if taken by a pregnant woman.  Patient advised not to share pills or donate blood while on treatment or for 3 years after completing treatment.    Labs reviewed from 06/28/20. Start Soriatane 25mg  take 1 po QOD (Rx written QD) Continue Cosentyx 300mg /ml sq q 30 days. Continue Enstilar Foam qam to Aas prn. Continue tazarotene 0.1% cream qhs. Start halobetasol cream - mix with tazarotene qhs. Start Zeasorb AF powder qam to feet.  Will do labs for Cosentyx, Soriatane on f/u.  Discussed Xtrac treatments pending insurance coverage.  Psoriasis - severe on systemic "biologic" treatment  injections.  Psoriasis is a chronic non-curable, but treatable genetic/hereditary disease that may have other systemic features affecting other organ systems such as joints (Psoriatic Arthritis).  It is linked with heart disease, inflammatory bowel disease, non-alcoholic fatty liver disease, and depression. Significant skin psoriasis and/or psoriatic arthritis may have significant symptoms and affects activities of daily activity and often benefits from systemic "biologic" injection treatments.  These "biologic" treatments have some potential side effects including immunosuppression and require pre-treatment laboratory screening and periodic laboratory monitoring and periodic in person evaluation and monitoring by the attending dermatologist physician.  Reviewed risks of biologics including immunosuppression, infections, injection site reaction, and failure to improve condition. Goal is control of skin condition, not cure.  Some older biologics such as Humira and Enbrel may slightly increase risk of malignancy and may worsen congestive heart failure. The use of biologics requires long term medication management, including periodic office visits and monitoring of blood work.  Topical steroids (such as triamcinolone, fluocinolone, fluocinonide, mometasone, clobetasol, halobetasol, betamethasone, hydrocortisone) can cause thinning and lightening of the skin if they are used for too long in the same area. Your physician has selected the right strength medicine for your problem and area affected on the body. Please use your medication only as directed by your physician to prevent side effects.      acitretin (SORIATANE) 25 MG capsule - hands, feet  halobetasol (ULTRAVATE) 0.05 % cream - hands, feet  Other Related Medications Calcipotriene-Betameth Diprop (ENSTILAR) 0.005-0.064 % FOAM tazarotene (TAZORAC) 0.1 % cream  Return in about 2 months (around 01/28/2021) for Psoriasis on Cosentyx, Soriatane.  IJamesetta Orleans, CMA, am acting as scribe for Brendolyn Patty, MD .  Documentation: I have reviewed the above documentation for accuracy and completeness, and I agree with the above.  Brendolyn Patty MD

## 2020-12-05 ENCOUNTER — Other Ambulatory Visit: Payer: Self-pay

## 2020-12-05 MED ORDER — COSENTYX SENSOREADY (300 MG) 150 MG/ML ~~LOC~~ SOAJ: 300.0000 mg | SUBCUTANEOUS | 2 refills | Status: DC

## 2020-12-05 NOTE — Progress Notes (Signed)
RX RF request per fax.

## 2020-12-06 ENCOUNTER — Other Ambulatory Visit: Payer: Self-pay

## 2020-12-06 ENCOUNTER — Encounter: Payer: Self-pay | Admitting: Internal Medicine

## 2020-12-06 ENCOUNTER — Ambulatory Visit: Payer: BC Managed Care – PPO | Admitting: Internal Medicine

## 2020-12-06 VITALS — BP 112/84 | HR 101 | Temp 97.0°F | Resp 15 | Ht 67.0 in | Wt 201.2 lb

## 2020-12-06 DIAGNOSIS — G8929 Other chronic pain: Secondary | ICD-10-CM

## 2020-12-06 DIAGNOSIS — I10 Essential (primary) hypertension: Secondary | ICD-10-CM | POA: Diagnosis not present

## 2020-12-06 DIAGNOSIS — M25511 Pain in right shoulder: Secondary | ICD-10-CM | POA: Diagnosis not present

## 2020-12-06 DIAGNOSIS — N951 Menopausal and female climacteric states: Secondary | ICD-10-CM

## 2020-12-06 DIAGNOSIS — F331 Major depressive disorder, recurrent, moderate: Secondary | ICD-10-CM | POA: Diagnosis not present

## 2020-12-06 DIAGNOSIS — E669 Obesity, unspecified: Secondary | ICD-10-CM

## 2020-12-06 DIAGNOSIS — E559 Vitamin D deficiency, unspecified: Secondary | ICD-10-CM

## 2020-12-06 DIAGNOSIS — Z720 Tobacco use: Secondary | ICD-10-CM

## 2020-12-06 NOTE — Patient Instructions (Signed)
Colleen Washington is feeling better !!  Eulis Canner!!!!  Glad to hear it   No medications were changed  [place]  Don't worry about the weight! ou WILL lose weight once you:  1) Reduce your portion size and start exercising regularly

## 2020-12-06 NOTE — Progress Notes (Signed)
Subjective:  Patient ID: Colleen Washington, female    DOB: [birth year]  Age: 60 y.o. MRN: [id number]  CC: The primary encounter diagnosis was Chronic right shoulder pain. Diagnoses of Vitamin D deficiency, Essential hypertension, Moderate episode of recurrent major depressive disorder ([place]), Symptoms, such as flushing, sleeplessness, headache, lack of concentration, associated with the menopause, Tobacco use, and Obesity (BMI 30.0-34.9) were also pertinent to this visit.  HPI Colleen Washington presents for follow up on depression aggravated by weight gain and chronic pain .  This visit occurred during the SARS-CoV-2 public health emergency.  Safety protocols were in place, including screening questions prior to the visit, additional usage of staff PPE, and extensive cleaning of exam room while observing appropriate contact time as indicated for disinfecting solutions.   Colleen Washington was seen one month ago for worsening depression,  Chronic arthritis related pain and weight gain.    She feels generally better and attributes her mood improvement to a change in jobs which has lowered her stress level.  As a result she is in less pain, and has had a spontaneous resolution of the troublesome menopausal symptoms. She is using the buspirone at night and the wellbutrin  300 mg in the morning    Outpatient Medications Prior to Visit  Medication Sig Dispense Refill  . acitretin (SORIATANE) 25 MG capsule Take 1 capsule (25 mg total) by mouth daily before breakfast. 30 capsule 1  . albuterol (VENTOLIN HFA) 108 (90 Base) MCG/ACT inhaler INHALE 1-2 PUFFS INTO THE LUNGS EVERY 4 HOURS AS NEEDED FOR WHEEZING OR SHORTNESS OF BREATH. 18 each 2  . buPROPion (WELLBUTRIN XL) 300 MG 24 hr tablet Take 1 tablet (300 mg total) by mouth daily. 30 tablet 2  . busPIRone (BUSPAR) 15 MG tablet Take 1 tablet (15 mg total) by mouth 2 (two) times daily as needed. 180 tablet 2  . Calcipotriene-Betameth Diprop (ENSTILAR) 0.005-0.064 % FOAM Apply  to affected areas palms and soles 1-2 times daily until improved. Avoid face, groin, underarms. 60 g 3  . cetirizine (ZYRTEC) 10 MG tablet Take 10 mg by mouth daily. Reported on 09/12/2015    . fluticasone (FLONASE) 50 MCG/ACT nasal spray USE 2 SPRAYS EACH NOSTRIL DAILY 16 g 3  . halobetasol (ULTRAVATE) 0.05 % cream Apply together with tazarotene 0.1% cream 1-2 times a day as needed. 50 g 2  . hydrOXYzine (VISTARIL) 25 MG capsule Take 1 capsule (25 mg total) by mouth every 8 (eight) hours as needed. 60 capsule 0  . hyoscyamine (LEVSIN SL) 0.125 MG SL tablet PLACE 1 TABLET UNDER THE TONGUE EVERY 4 HOURS AS NEEDED 30 tablet 1  . pantoprazole (PROTONIX) 40 MG tablet TAKE 1 TABLET BY MOUTH EVERY DAY 90 tablet 3  . promethazine (PHENERGAN) 12.5 MG tablet TAKE ONE TABLET BY MOUTH EVERY 6 HOURS AS NEEDED FOR NAUSEA AND VOMITING. 30 tablet 0  . propranolol (INDERAL) 10 MG tablet TAKE 1 TABLET (10 MG TOTAL) BY MOUTH 3 (THREE) TIMES DAILY. AS NEEDED FOR RAPID HEART RATE 30 tablet 0  . Secukinumab, 300 MG Dose, (COSENTYX SENSOREADY, 300 MG,) 150 MG/ML SOAJ Inject 300 mg into the skin every 28 (twenty-eight) days. For maintenance. 2 mL 2  . spironolactone (ALDACTONE) 25 MG tablet Take 1 tablet (25 mg total) by mouth daily. 90 tablet 3  . tazarotene (TAZORAC) 0.1 % cream Apply to hands and feet every night as needed for psoriasis. 60 g 2  . Vitamin D, Ergocalciferol, (DRISDOL) 1.25 MG (50000  UNIT) CAPS capsule TAKE 1 CAPSULE BY MOUTH ONE TIME PER WEEK 12 capsule 0  . atorvastatin (LIPITOR) 40 MG tablet Take 1 tablet (40 mg total) by mouth daily. 90 tablet 3  . famotidine (PEPCID) 40 MG tablet Take 1 tablet (40 mg total) by mouth at bedtime. 90 tablet 1   No facility-administered medications prior to visit.    Review of Systems;  Patient denies headache, fevers, malaise, unintentional weight loss, skin rash, eye pain, sinus congestion and sinus pain, sore throat, dysphagia,  hemoptysis , cough, dyspnea,  wheezing, chest pain, palpitations, orthopnea, edema, abdominal pain, nausea, melena, diarrhea, constipation, flank pain, dysuria, hematuria, urinary  Frequency, nocturia, numbness, tingling, seizures,  Focal weakness, Loss of consciousness,  Tremor, insomnia, depression, anxiety, and suicidal ideation.      Objective:  BP 112/84 (BP Location: Left Arm, Patient Position: Sitting, Cuff Size: Normal)   Pulse (!) 101   Temp (!) 97 F (36.1 C) (Temporal)   Resp 15   Ht 5\' 7"  (1.702 m)   Wt 201 lb 3.2 oz (91.3 kg)   SpO2 97%   BMI 31.51 kg/m   BP Readings from Last 3 Encounters:  12/06/20 112/84  10/26/20 98/68  10/03/20 118/82    Wt Readings from Last 3 Encounters:  12/06/20 201 lb 3.2 oz (91.3 kg)  10/26/20 203 lb (92.1 kg)  10/03/20 203 lb 3.2 oz (92.2 kg)    General appearance: alert, cooperative and appears stated age Ears: normal TM's and external ear canals both ears Throat: lips, mucosa, and tongue normal; teeth and gums normal Neck: no adenopathy, no carotid bruit, supple, symmetrical, trachea midline and thyroid not enlarged, symmetric, no tenderness/mass/nodules Back: symmetric, no curvature. ROM normal. No CVA tenderness. Lungs: clear to auscultation bilaterally Heart: regular rate and rhythm, S1, S2 normal, no murmur, click, rub or gallop Abdomen: soft, non-tender; bowel sounds normal; no masses,  no organomegaly Pulses: 2+ and symmetric Skin: Skin color, texture, turgor normal. No rashes or lesions Lymph nodes: Cervical, supraclavicular, and axillary nodes normal.  Lab Results  Component Value Date   HGBA1C 5.2 10/14/2016    Lab Results  Component Value Date   CREATININE 0.88 12/06/2020   CREATININE 0.91 06/28/2020   CREATININE 0.92 11/19/2019    Lab Results  Component Value Date   WBC 5.3 06/28/2020   HGB 13.3 06/28/2020   HCT 39.5 06/28/2020   PLT 167.0 06/28/2020   GLUCOSE 99 12/06/2020   CHOL 171 06/28/2020   TRIG 131.0 06/28/2020   HDL 52.30  06/28/2020   LDLCALC 92 06/28/2020   ALT 17 12/06/2020   AST 15 12/06/2020   NA 140 12/06/2020   K 3.8 12/06/2020   CL 104 12/06/2020   CREATININE 0.88 12/06/2020   BUN 13 12/06/2020   CO2 28 12/06/2020   TSH 1.42 06/28/2020   HGBA1C 5.2 10/14/2016    No results found.  Assessment & Plan:   Problem List Items Addressed This Visit      Unprioritized   Essential hypertension   Relevant Orders   Comprehensive metabolic panel (Completed)   Moderate episode of recurrent major depressive disorder ([place])    She is feeling better on 300 mg wellbutrin daily and less anxious with the addition of  buspirone 15 mg bid .  No changes to regimen were made today       Obesity (BMI 30.0-34.9)    Reviewed her diet which seems fine; advised to evaluate portion size and reduce to just enough  to satisfy the feeling of hunger.  Encouraged to  address the lack of exercise with  30 minutes of low impact aerobic exercise (not walking).        Symptoms, such as flushing, sleeplessness, headache, lack of concentration, associated with the menopause    Her symptoms have spontaneously abated       Tobacco use    Still smoking .  5 minutes was spent in face to face time discussing patient's current tobacco abuse,  Prior attempts to quit, the current and future hazards to patient's  health,  And assessing and discussing patient's  level of motivation to quit. Patient was encouraged to consider pharmacotherapy.       Vitamin D deficiency   Relevant Orders   VITAMIN D 25 Hydroxy (Vit-D Deficiency, Fractures) (Completed)    Other Visit Diagnoses    Chronic right shoulder pain    -  Primary   Relevant Orders   Ambulatory referral to Sports Medicine      I am having [place]. Ovid Curd "Colleen Washington" maintain her cetirizine, hyoscyamine, fluticasone, propranolol, atorvastatin, Enstilar, famotidine, promethazine, pantoprazole, tazarotene, albuterol, hydrOXYzine, spironolactone, Vitamin D (Ergocalciferol), busPIRone,  buPROPion, acitretin, halobetasol, and Cosentyx Sensoready (300 MG).  No orders of the defined types were placed in this encounter.   There are no discontinued medications.  Follow-up: No follow-ups on file.   Crecencio Mc, MD

## 2020-12-07 DIAGNOSIS — M62830 Muscle spasm of back: Secondary | ICD-10-CM | POA: Diagnosis not present

## 2020-12-07 DIAGNOSIS — M9903 Segmental and somatic dysfunction of lumbar region: Secondary | ICD-10-CM | POA: Diagnosis not present

## 2020-12-07 DIAGNOSIS — M9901 Segmental and somatic dysfunction of cervical region: Secondary | ICD-10-CM | POA: Diagnosis not present

## 2020-12-07 DIAGNOSIS — M5033 Other cervical disc degeneration, cervicothoracic region: Secondary | ICD-10-CM | POA: Diagnosis not present

## 2020-12-07 LAB — COMPREHENSIVE METABOLIC PANEL
ALT: 17 U/L (ref 0–35)
AST: 15 U/L (ref 0–37)
Albumin: 4.5 g/dL (ref 3.5–5.2)
Alkaline Phosphatase: 75 U/L (ref 39–117)
BUN: 13 mg/dL (ref 6–23)
CO2: 28 mEq/L (ref 19–32)
Calcium: 9.7 mg/dL (ref 8.4–10.5)
Chloride: 104 mEq/L (ref 96–112)
Creatinine, Ser: 0.88 mg/dL (ref 0.40–1.20)
GFR: 72.31 mL/min (ref >60.00)
Glucose, Bld: 99 mg/dL (ref 70–99)
Potassium: 3.8 mEq/L (ref 3.5–5.1)
Sodium: 140 mEq/L (ref 135–145)
Total Bilirubin: 0.7 mg/dL (ref 0.2–1.2)
Total Protein: 7.3 g/dL (ref 6.0–8.3)

## 2020-12-07 LAB — VITAMIN D 25 HYDROXY (VIT D DEFICIENCY, FRACTURES): VITD: 30.27 ng/mL (ref 30.00–100.00)

## 2020-12-08 NOTE — Assessment & Plan Note (Signed)
She is feeling better on 300 mg wellbutrin daily and less anxious with the addition of  buspirone 15 mg bid .  No changes to regimen were made today

## 2020-12-08 NOTE — Assessment & Plan Note (Signed)
?  Still smoking .  5 minutes was spent in face to face time discussing patient's current tobacco abuse,  Prior attempts to quit, the current and future hazards to patient's  health,  And assessing and discussing patient's  level of motivation to quit. Patient was encouraged to consider pharmacotherapy.  ?

## 2020-12-08 NOTE — Assessment & Plan Note (Signed)
Reviewed her diet which seems fine; advised to evaluate portion size and reduce to just enough to satisfy the feeling of hunger.  Encouraged to  address the lack of exercise with  30 minutes of low impact aerobic exercise (not walking).

## 2020-12-08 NOTE — Assessment & Plan Note (Signed)
Her symptoms have spontaneously abated

## 2020-12-13 ENCOUNTER — Telehealth: Payer: Self-pay

## 2020-12-13 NOTE — Telephone Encounter (Signed)
Called patient and left message to return my call regarding [employer] benefits.   Patient has a $2000 deductible and only has met $187.96. Once deductible is met patient portion will only be 10%. 

## 2020-12-19 ENCOUNTER — Other Ambulatory Visit: Payer: Self-pay | Admitting: Internal Medicine

## 2020-12-20 ENCOUNTER — Encounter: Payer: Self-pay | Admitting: Dermatology

## 2020-12-26 ENCOUNTER — Ambulatory Visit: Payer: BC Managed Care – PPO | Admitting: Physical Therapy

## 2020-12-26 NOTE — Progress Notes (Deleted)
SUBJECTIVE Chief complaint: Onset: Shoulder trauma: {yes/no:20286}  Pain quality:  Pain: /10 Present, /10 Best, /10 Worst: Aggravating factors: Easing factors: 24 hour pain behavior:  Radiating pain: {yes/no:20286} Numbness/Tingling: {yes/no:20286} Prior history of shoulder injury or pain: {yes/no:20286} Prior history of therapy for shoulder: {yes/no:20286} Follow-up appointment with MD: {yes/no:20286} Dominant hand: Right  OBJECTIVE  MUSCULOSKELETAL: Tremor: Normal Bulk: Normal Tone: Normal  Cervical Screen AROM: WFL and painless with overpressure in all planes Spurlings A (ipsilateral lateral flexion/axial compression): R: {NEGATIVE/POSITIVE CLE:75170} L: {NEGATIVE/POSITIVE YFV:49449} Spurlings B (ipsilateral lateral flexion/contralateral rotation/axial compression): R: {NEGATIVE/POSITIVE QPR:91638} L: {NEGATIVE/POSITIVE GYK:59935} Repeated movement: No centralization or peripheralization with protraction or retraction Hoffman Sign (cervical cord compression): R: {NEGATIVE/POSITIVE TSV:77939} L: {NEGATIVE/POSITIVE QZE:09233} ULTT Median: R: {NEGATIVE/POSITIVE AQT:62263} L: {NEGATIVE/POSITIVE FHL:45625} ULTT Ulnar: R: {NEGATIVE/POSITIVE WLS:93734} L: {NEGATIVE/POSITIVE KAJ:68115} ULTT Radial: R: {NEGATIVE/POSITIVE BWI:20355} L: {NEGATIVE/POSITIVE HRC:16384}  Elbow Screen Elbow AROM: {TXM:46803}  Palpation No pain with palpation to anterior, posterior, lateral, and superior shoulder  Strength R/L 5/5 Shoulder flexion (anterior deltoid/pec major/coracobrachialis, axillary n. (C5-6) and musculocutaneous n. (C5-7)) 5/5 Shoulder abduction (deltoid/supraspinatus, axillary/suprascapular n, C5) 5/5 Shoulder external rotation (infraspinatus/teres minor) 5/5 Shoulder internal rotation (subcapularis/lats/pec major) 5/5 Shoulder extension (posterior deltoid, lats, teres major, axillary/thoracodorsal n.) 5/5 Shoulder horizontal abduction 5/5 Elbow flexion (biceps brachii,  brachialis, brachioradialis, musculoskeletal n, C5-6) 5/5 Elbow extension (triceps, radial n, C7) 5/5 Wrist Extension 5/5 Wrist Flexion 5/5 Finger adduction (interossei, ulnar n, T1)  AROM R/L 180/180 Shoulder flexion 180/180 Shoulder abduction 90/90 Shoulder external rotation 70/70 Shoulder internal rotation 60/60 Shoulder extension *Indicates pain, overpressure performed unless otherwise indicated  PROM R/L 180/180 Shoulder flexion 180/180 Shoulder abduction 90/90 Shoulder external rotation 70/70 Shoulder internal rotation 60/60 Shoulder extension *Indicates pain, overpressure performed unless otherwise indicated  Accessory Motions/Glides Glenohumeral: Posterior: R: {normal/abnormal/not examined:14677} L: {normal/abnormal/not examined:14677} Inferior: R: {normal/abnormal/not examined:14677} L: {normal/abnormal/not examined:14677} Anterior: R: {normal/abnormal/not examined:14677} L: {normal/abnormal/not examined:14677}  Acromioclavicular:  Posterior: R: {normal/abnormal/not examined:14677} L: {normal/abnormal/not examined:14677} Anterior: R: {normal/abnormal/not examined:14677} L: {normal/abnormal/not examined:14677}  Sternoclavicular: Posterior: R: {normal/abnormal/not examined:14677} L: {normal/abnormal/not examined:14677} Anterior: R: {normal/abnormal/not examined:14677} L: {normal/abnormal/not examined:14677} Superior: R: {normal/abnormal/not examined:14677} L: {normal/abnormal/not examined:14677} Inferior: R: {normal/abnormal/not examined:14677} L: {normal/abnormal/not examined:14677}  Scapulothoracic: Distraction: R: {normal/abnormal/not examined:14677} L: {normal/abnormal/not examined:14677} Medial: R: {normal/abnormal/not examined:14677} L: {normal/abnormal/not examined:14677} Lateral: R: {normal/abnormal/not examined:14677} L: {normal/abnormal/not examined:14677} Inferior: R: {normal/abnormal/not examined:14677} L: {normal/abnormal/not examined:14677} Superior:  R: {normal/abnormal/not examined:14677} L: {normal/abnormal/not examined:14677}  Muscle Length Testing Pectoralis Major: R: {normal/abnormal/not examined:14677} L: {normal/abnormal/not examined:14677} Pectoralis Minor: R: {normal/abnormal/not examined:14677} L: {normal/abnormal/not examined:14677} Biceps: R: {normal/abnormal/not examined:14677} L: {normal/abnormal/not examined:14677}  NEUROLOGICAL:  Mental Status Patient is oriented to person, place and time.  Recent memory is intact.  Remote memory is intact.  Attention span and concentration are intact.  Expressive speech is intact.  Patient's fund of knowledge is within normal limits for educational level.  Cranial Nerves Visual acuity and visual fields are intact  Extraocular muscles are intact  Facial sensation is intact bilaterally  Facial strength is intact bilaterally  Hearing is normal as tested by gross conversation Palate elevates midline, normal phonation  Shoulder shrug strength is intact  Tongue protrudes midline   Sensation Grossly intact to light touch bilateral UE as determined by testing dermatomes C2-T2 Proprioception and hot/cold testing deferred on this date  Reflexes R/L 2+/2+ Biceps 2+/2+ Brachioradialis 2+/2+ Triceps  Coordination/Cerebellar Finger to nose: R: {normal/abnormal/not examined:14677} L: {normal/abnormal/not examined:14677} Pronator drift: {normal/abnormal/not examined:14677} Finger opposition: R: {normal/abnormal/not examined:14677} L: {normal/abnormal/not examined:14677}  SPECIAL TESTS  Rotator Cuff  Drop Arm  Test: {NEGATIVE/POSITIVE NOB:09628} Painful Arc (Pain from 60 to 120 degrees scaption): {NEGATIVE/POSITIVE ZMO:29476} Infraspinatus Muscle Test: {NEGATIVE/POSITIVE LYY:50354} If all 3 tests positive, the probability of a full-thickness rotator cuff tear is 91%  Subacromial Impingement Hawkins-Kennedy: {NEGATIVE/POSITIVE SFK:81275} Neer (Block scapula, PROM flexion):  {NEGATIVE/POSITIVE TZG:01749} Painful Arc (Pain from 60 to 120 degrees scaption): {NEGATIVE/POSITIVE SWH:67591} Empty Can: {NEGATIVE/POSITIVE MBW:46659} External Rotation Resistance: {NEGATIVE/POSITIVE DJT:70177} Horizontal Adduction: {NEGATIVE/POSITIVE LTJ:03009} Scapular Assist: {NEGATIVE/POSITIVE QZR:00762} Positive Hawkins-Kennedy, Painful arc sign, Infraspinatus muscle test then +LR: 10.56 of some type of impingement present, 2/3 tests: +LR 5.06, -LR 0.17, Positive 3/5 Hawkins-Kennedy, neer, painful arc, empty can, and external rotation resistance then SN: .75 (.54-.96) SP: .74 (.61-.88) +LR: 2.93 (1.60-5.36) -LR: .34 (.14-.80)  Labral Tear Biceps Load II (120 elevation, full ER, 90 elbow flexion, full supination, resisted elbow flexion): {NEGATIVE/POSITIVE UQJ:33545} Crank (160 scaption, axial load with IR/ER): {NEGATIVE/POSITIVE GYB:63893} Active Compression Test: {NEGATIVE/POSITIVE TDS:28768}  Bicep Tendon Pathology Speed (shoulder flexion to 90, external rotation, full elbow extension, and forearm supination with resistance: {NEGATIVE/POSITIVE TLX:72620} Yergason's (resisted shoulder ER and supination/biceps tendon pathology): {NEGATIVE/POSITIVE BTD:97416}  Shoulder Instability Sulcus Sign: {NEGATIVE/POSITIVE LAG:53646} Anterior Apprehension: {NEGATIVE/POSITIVE OEH:21224}  Outcome Measures Quick DASH:  SPADI:   CPR for cervicothoracic manipulation for shoulder pain Pain-free shoulder flexion < 120? Shoulder internal rotation < 53? @ 90? of abduction Negative Neer's Test Not taking medications for their shoulder pain Symptoms < 90 days Probability of success: 1: 61%, 2: 78%, 3: 89% 4/5: 100%  ASSESSMENT Pt is a pleasant year-old referred for shoulder pain. PT examination reveals deficits in . Pt will benefit from PT services to address deficits in strength, mobility, and pain in order to return to full function at home with less shoulder pain.  No personal  factors/comorbidities, 2 or less body systems/activity limitations/participation restrictions   2 personal factors/comorbidities, 3 or more body systems/activity limitations/participation restrictions   3 or more personal factors/comorbidities, 4 or more body systems/activity limitations/participation restrictions     PLAN HEP:  Next Visit:   Pt will be independent with HEP in order to improve strength and decrease pain in order to improve pain-free function at home and work.  Pt will decrease quick DASH score by at least 8% in order to demonstrate clinically significant reduction in disability.  Pt will decrease worst pain as reported on NPRS by at least 3 points in order to demonstrate clinically significant reduction in pain.  Pt will increase strength of  by at least 1/2 MMT grade in order to demonstrate improvement in strength and function

## 2020-12-28 ENCOUNTER — Other Ambulatory Visit: Payer: Self-pay

## 2020-12-28 ENCOUNTER — Encounter: Payer: BC Managed Care – PPO | Admitting: Physical Therapy

## 2020-12-28 ENCOUNTER — Ambulatory Visit: Payer: BC Managed Care – PPO | Attending: Internal Medicine | Admitting: Physical Therapy

## 2020-12-28 ENCOUNTER — Encounter: Payer: Self-pay | Admitting: Physical Therapy

## 2020-12-28 DIAGNOSIS — M25511 Pain in right shoulder: Secondary | ICD-10-CM | POA: Diagnosis not present

## 2020-12-28 DIAGNOSIS — M7541 Impingement syndrome of right shoulder: Secondary | ICD-10-CM | POA: Diagnosis not present

## 2020-12-28 DIAGNOSIS — M67911 Unspecified disorder of synovium and tendon, right shoulder: Secondary | ICD-10-CM

## 2020-12-28 DIAGNOSIS — G8929 Other chronic pain: Secondary | ICD-10-CM

## 2020-12-28 NOTE — Therapy (Signed)
[facility] [address], [place], [address] Phone: [phone]   Fax:  [phone]   Physical Therapy Evaluation  Patient Details  Name: Colleen Washington MRN: [id number] Date of Birth: 08-Jun-1961 Referring Provider (PT): tullo,teresa  Encounter Date: 12/28/2020   PT End of Session - 12/28/20 1527     Visit Number 1    Number of Visits 17    Date for PT Re-Evaluation 02/22/21    PT Start Time 1330    PT Stop Time 7253    PT Time Calculation (min) 45 min    Activity Tolerance Patient tolerated treatment well;Patient limited by pain    Behavior During Therapy [facility] for tasks assessed/performed             Past Medical History:  Diagnosis Date   allergic rhinitis    Anxiety    AR (allergic rhinitis)    Asthma    Depression    Endometriosis    GERD (gastroesophageal reflux disease)    H/O migraine    Herniated intervertebral disc    History of diverticulitis of colon    Hyperlipidemia    Hypertension    IBS (irritable bowel syndrome)    Irritable bowel syndrome    Nonobstructive CAD (coronary artery disease)    a. 11/2019 Cor CTA: Cor Ca2+ = 141 (95th %'ile). LAD 25-49p.   Psoriasis    Tobacco abuse     Past Surgical History:  Procedure Laterality Date   ABDOMINAL HYSTERECTOMY  2003   tvhuso   BREAST BIOPSY Left 08/2014   benign-BENIGN BREAST TISSUE WITH FIBROSIS   DILATION AND CURETTAGE OF UTERUS      There were no vitals filed for this visit.    Subjective Assessment - 12/28/20 1329     Subjective Pt reports that she is still experiencing right shoulder pain especially when rolling over in bed and laying on it and extending it. Did experience shoulder pain after lifting object overhead at work. Describes feeling pain on anterior right shoulder and that pain radiates down arm.    Pertinent History Reduction in rom and onset of pain coincides with lifting 4 weeks ago. Concern for shoulder  impingement and/or rotator cuff injury. We will trial aggressive icing regimen, voltaren gel and if no improvement consult orthopedics. Pain closer to elbow itself raises suspicion for with overuse syndrome , question lateral epicondylitis. Pending XRays. Follow up in 6 weeks (10/03/20)    Limitations Other (comment)   Laying down on shoulder or picking up heavy object   How long can you sit comfortably? No problems sitting    How long can you stand comfortably? No problems standing    How long can you walk comfortably? No problems walking    Diagnostic tests X-ray no remarkable results    Patient Stated Goals Wants to avoid shoulder surgery and place pressure on arm without feeling pain and liftt without feeling pain.    Currently in Pain? Yes    Pain Score 0-No pain    Pain Location Shoulder    Pain Orientation Right   Biceps tendon down biceps   Pain Type Acute pain    Pain Radiating Towards Elbow    Pain Onset More than a month ago    Pain Frequency Intermittent    Aggravating Factors  Placing pressure on arm and extending arm or lifting overhead    Pain Relieving Factors Extending arm and rubbing it  Effect of Pain on Daily Activities Not much effect just with lifting and sleeping on arm                [facility] PT Assessment - 12/28/20 0001       Assessment   Medical Diagnosis right shoulder pain    Referring Provider (PT) tullo,teresa    Hand Dominance Left    Next MD Visit unknown    Prior Therapy No      Precautions   Precautions None    Precaution Comments None      Balance Screen   Has the patient fallen in the past 6 months No      [place] residence    Living Arrangements Spouse/significant other    Type of Teague      Prior Function   Level of Independence Independent    Vocation Full time employment    Vocation Requirements Ocassional lifting, but does not need to do      Cognition   Overall Cognitive Status  Within Functional Limits for tasks assessed    Attention Focused    Focused Attention Appears intact    Memory Appears intact               Cervical Radiculopathy Test cluster: Deferred no neuro signs    -rotation AROM <60 deg towards symptomatic side?:   -Spurling's test:   -response to manual traction:   -upper limb tension test A:    AROM (in standing):      R      L     Normal     Shoulder flexion:        115* 115     180            extension:            60*   60         60            abduction:            90*  90        180   ER(90 deg Abd):          30*   80          90      IR (90 deg Abd):           70    70         70  Combined IR                Sacrum*  T7          PROM (in supine):        R         L            Normal      Shoulder flexion:      115*  115*            180            abduction:           90*     90*             60  ER(90 deg Abd):          30*       80             90  IR (90 deg Abd):           70       70            70        Combined IR                 Sacrum*       Strength:                            R         L      Shoulder flexion:          5/5         5/5             abduction:              2+/5       2+ /5        IR (0 deg abd):           5/5          5/5           ER (0 deg abd):         5/5           5/5 ---------------------------------------  Special Tests:                      SHOULDER                                                     R           L Spurlings:      Screen: (R UE)   -IRRST:          IR>ER   Impingement:                     -Hawkins-Kennedy:                +            -     -pain with infraspinatus MMT: -            -     -Neer:                                     +            -    -painful arc:                            +            +    Rotator cuff tear:    -full can:                                 -              -    -lift off:                                    +             -     -  drop arm:                              NT          NT    -belly press:                          NT          NT   AC joint:    -AC sheer:                            NT          NT   Biceps Tendinopathy              +             -                                                  ------------------------------------ Palpation/Accessory: Right anterior shoulder pain along biceps      PT Short Term Goals - 12/28/20 1539       PT SHORT TERM GOAL #1   Title Pt will demonstrate understanding of HEP.    Time 2    Period Weeks    Status New    Target Date 01/11/21               PT Long Term Goals - 12/28/20 1540       PT LONG TERM GOAL #1   Title Pt will improve bilateral shoulder flexion AROM to >=140 degrees to be able to perform overhead activities for job and for self-grooming.    Baseline 6/16: 115 bilateral    Time 8    Period Weeks    Status New    Target Date 02/22/21      PT LONG TERM GOAL #2   Title Pt will improve combined IR of right shoulder to be symmetrical with left shoulder in order to perform self grooming activities.    Baseline 6/16:    Time 8    Period Weeks    Status New    Target Date 02/22/21      PT LONG TERM GOAL #3   Title Pt will improve shoulder ER of right shoulder to be symmetrical with left shoulder to be able to perform overhead reaching activities for her job.    Baseline 6/16: Right shoulder ER 30    Time 8    Period Weeks    Status New    Target Date 02/22/21      PT LONG TERM GOAL #4   Title Pt will improve FOTO score to target score to indicate clinically significant improvement with functional ability.    Baseline 6/16: 54, target score of 67    Time 8    Period Weeks    Status New    Target Date 02/22/21                    Plan - 12/28/20 1528     Clinical Impression Statement Pt is a 60 yo female that presents with right shoulder pain that occured with insideous onset around February 2022 and that radiates down elbow. Pt  demonstrates signs and symptoms indicative of subacromial impingement and subscapularis tendinopathy. She demonstrates right shoulder pain with extension, internal rotation, and with overhead movement. Pt right shoulder pain is limiting her ability to perfom job functions such overhead lifting and dressing.    Personal Factors and Comorbidities Time since onset of injury/illness/exacerbation;Comorbidity 1    Comorbidities Depression    Examination-Activity Limitations Bed Mobility;Dressing;Sleep;Lift    Examination-Participation Restrictions Occupation    Stability/Clinical Decision Making Stable/Uncomplicated    Clinical Decision Making Low    Rehab Potential Good    PT Frequency 2x / week    PT Duration 8 weeks    PT Next Visit Plan Finish should strength assessment, progress shoulder mobility and strength    PT Home Exercise Plan Sharkey code [email or web address]    Consulted and Agree with Plan of Care Patient             Exercises include the following   Access Code: [email or web address] URL: [email or web address] Date: 12/28/2020 Prepared by: Bradly Chris  Exercises Seated Shoulder Flexion AAROM with Pulley Behind - 1 x daily - 3 x weekly - 3 sets - 10 reps Seated Shoulder Abduction AAROM with Pulley Behind - 1 x daily - 3 x weekly - 3 sets - 10 reps Seated Upper Trapezius Stretch - 1 x daily - 7 x weekly - 1 sets - 5 reps - 30 hold  Patient will benefit from skilled therapeutic intervention in order to improve the following deficits and impairments:  Decreased range of motion, Decreased strength, Pain  Visit Diagnosis: Chronic right shoulder pain  Tendinopathy of right rotator cuff  Subacromial impingement of right shoulder     Problem List Patient Active Problem List   Diagnosis Date Noted   Moderate episode of recurrent major depressive disorder ([place]) 10/29/2020   Right arm pain 10/03/2020   History of COVID-19 07/28/2020   Chest pain of uncertain etiology  05/39/7673   Dyspnea on exertion 10/22/2019   Palpitations 10/22/2019   PAC (premature atrial contraction) 10/22/2019   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   SVT (supraventricular tachycardia) ([place]) 08/15/2018   Thyroid nodule 10/28/2016   Ovarian cyst, right 09/30/2015   Endometriosis 08/10/2015   Status post vaginal hysterectomy 08/10/2015   Anxiety 08/10/2015   Obesity (BMI 30.0-34.9) 08/10/2015   Vitamin D deficiency 05/26/2015   Weight gain 02/22/2015   Menopause 09/02/2014   Visit for preventive health examination 08/24/2013   Snoring 08/24/2013   Symptoms, such as flushing, sleeplessness, headache, lack of concentration, associated with the menopause 08/24/2013   Encounter for drug screening 08/13/2013   Migraine headache 08/26/2012   Panic disorder without agoraphobia with mild panic attacks 08/26/2012   Bronchitis, chronic ([place]) 05/09/2012   History of diverticulitis of colon    GERD (gastroesophageal reflux disease)    Hyperlipidemia    Irritable bowel syndrome    Essential hypertension    Herniated intervertebral disc    Tobacco use 03/04/2012    Daneil Dan 12/28/2020, 5:04 PM  [facility] [address] [place], [address] Phone: [phone]   Fax:  [phone]  Name: BRADEE COMMON MRN: [id number] Date of Birth: [birth year]

## 2020-12-29 ENCOUNTER — Other Ambulatory Visit: Payer: Self-pay | Admitting: Internal Medicine

## 2020-12-29 ENCOUNTER — Other Ambulatory Visit: Payer: Self-pay | Admitting: *Deleted

## 2020-12-29 MED ORDER — ATORVASTATIN CALCIUM 40 MG PO TABS: 40.0000 mg | ORAL_TABLET | Freq: Every day | ORAL | 0 refills | Status: DC

## 2020-12-29 NOTE — Telephone Encounter (Signed)
Patient will call back to schedule ?

## 2021-01-03 ENCOUNTER — Other Ambulatory Visit: Payer: Self-pay

## 2021-01-03 ENCOUNTER — Ambulatory Visit: Payer: BC Managed Care – PPO | Admitting: Physical Therapy

## 2021-01-03 DIAGNOSIS — G8929 Other chronic pain: Secondary | ICD-10-CM | POA: Diagnosis not present

## 2021-01-03 DIAGNOSIS — M7541 Impingement syndrome of right shoulder: Secondary | ICD-10-CM

## 2021-01-03 DIAGNOSIS — M67911 Unspecified disorder of synovium and tendon, right shoulder: Secondary | ICD-10-CM

## 2021-01-03 DIAGNOSIS — M25511 Pain in right shoulder: Secondary | ICD-10-CM | POA: Diagnosis not present

## 2021-01-03 NOTE — Therapy (Signed)
[facility] [address], [place], [address] Phone: [phone]   Fax:  [phone]  Physical Therapy Treatment  Patient Details  Name: Colleen Washington MRN: [id number] Date of Birth: 12/01/1960 Referring Provider (PT): tullo,teresa   Encounter Date: 01/03/2021    Past Medical History:  Diagnosis Date   allergic rhinitis    Anxiety    AR (allergic rhinitis)    Asthma    Depression    Endometriosis    GERD (gastroesophageal reflux disease)    H/O migraine    Herniated intervertebral disc    History of diverticulitis of colon    Hyperlipidemia    Hypertension    IBS (irritable bowel syndrome)    Irritable bowel syndrome    Nonobstructive CAD (coronary artery disease)    a. 11/2019 Cor CTA: Cor Ca2+ = 141 (95th %'ile). LAD 25-49p.   Psoriasis    Tobacco abuse     Past Surgical History:  Procedure Laterality Date   ABDOMINAL HYSTERECTOMY  2003   tvhuso   BREAST BIOPSY Left 08/2014   benign-BENIGN BREAST TISSUE WITH FIBROSIS   DILATION AND CURETTAGE OF UTERUS      There were no vitals filed for this visit.    Shoulder Eval Continued:  -Resisted IR at 90 deg painful on right arm  -Lift Off with RUE +    THEREX:  Ball Rolls Flexion with RUE  1 x 15  Ball Rolls Abduction with RUE 1 X 15  Biceps Stretch 1 x  30 sec  -No relief of pain in biceps   Eccentric ER at 0 deg abduction with YTB 2 x 10  -Vcs to slow progression of exercise   Sleeper Stretch  2 x 30 sec   Push-up with Plus 2 x 10  -VC to increase scapular protraction         PT Short Term Goals - 01/03/21 1741       PT SHORT TERM GOAL #1   Title Pt will demonstrate understanding of HEP.    Time 2    Period Weeks    Status New    Target Date 01/11/21               PT Long Term Goals - 01/03/21 1742       PT LONG TERM GOAL #1   Title Pt will improve bilateral shoulder flexion AROM to >=140 degrees to be able  to perform overhead activities for job and for self-grooming.    Baseline 6/16: 115 bilateral    Time 8    Period Weeks    Status New      PT LONG TERM GOAL #2   Title Pt will improve combined IR of right shoulder to be symmetrical with left shoulder in order to perform self grooming activities.    Baseline 6/16:    Time 8    Period Weeks    Status New      PT LONG TERM GOAL #3   Title Pt will improve shoulder ER of right shoulder to be symmetrical with left shoulder to be able to perform overhead reaching activities for her job.    Baseline 6/16: Right shoulder ER 30    Time 8    Period Weeks    Status New      PT LONG TERM GOAL #4   Title Pt will improve FOTO score to target score to indicate clinically significant improvement with functional  ability.    Baseline 6/16: 54, target score of 67    Time 8    Period Weeks    Status New                       Access Code: [email or web address] URL: [email or web address] Date: 01/03/2021 Prepared by: Bradly Chris  Exercises Seated Shoulder Flexion AAROM with Pulley Behind - 1 x daily - 3 x weekly - 3 sets - 10 reps Seated Shoulder Abduction AAROM with Pulley Behind - 1 x daily - 3 x weekly - 3 sets - 10 reps Seated Upper Trapezius Stretch - 1 x daily - 7 x weekly - 1 sets - 5 reps - 30 hold Shoulder Internal Rotation with Resistance - 1 x daily - 3 x weekly - 3 sets - 10 reps Wall Push Up with Plus - 1 x daily - 3 x weekly - 3 sets - 10 reps Supine Chest Stretch on Swiss Ball - 1 x daily - 7 x weekly - 1 sets - 5 reps - 30 hold Sleeper Stretch - 1 x daily - 7 x weekly - 1 sets - 5 reps  Patient will benefit from skilled therapeutic intervention in order to improve the following deficits and impairments:  Decreased range of motion, Decreased strength, Pain  Visit Diagnosis: Chronic right shoulder pain  Tendinopathy of right rotator cuff  Subacromial impingement of right shoulder     Problem  List Patient Active Problem List   Diagnosis Date Noted   Moderate episode of recurrent major depressive disorder ([place]) 10/29/2020   Right arm pain 10/03/2020   History of COVID-19 07/28/2020   Chest pain of uncertain etiology 93/71/6967   Dyspnea on exertion 10/22/2019   Palpitations 10/22/2019   PAC (premature atrial contraction) 10/22/2019   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   SVT (supraventricular tachycardia) ([facility]) 08/15/2018   Thyroid nodule 10/28/2016   Ovarian cyst, right 09/30/2015   Endometriosis 08/10/2015   Status post vaginal hysterectomy 08/10/2015   Anxiety 08/10/2015   Obesity (BMI 30.0-34.9) 08/10/2015   Vitamin D deficiency 05/26/2015   Weight gain 02/22/2015   Menopause 09/02/2014   Visit for preventive health examination 08/24/2013   Snoring 08/24/2013   Symptoms, such as flushing, sleeplessness, headache, lack of concentration, associated with the menopause 08/24/2013   Encounter for drug screening 08/13/2013   Migraine headache 08/26/2012   Panic disorder without agoraphobia with mild panic attacks 08/26/2012   Bronchitis, chronic ([place]) 05/09/2012   History of diverticulitis of colon    GERD (gastroesophageal reflux disease)    Hyperlipidemia    Irritable bowel syndrome    Essential hypertension    Herniated intervertebral disc    Tobacco use 03/04/2012    Daneil Dan 01/09/2021, 4:17 PM  [facility] [address], [place], [address] Phone: [phone]   Fax:  [phone]  Name: Colleen Washington MRN: [id number] Date of Birth: June 08, 1961

## 2021-01-04 ENCOUNTER — Encounter: Payer: BC Managed Care – PPO | Admitting: Physical Therapy

## 2021-01-08 ENCOUNTER — Encounter: Payer: BC Managed Care – PPO | Admitting: Physical Therapy

## 2021-01-09 ENCOUNTER — Other Ambulatory Visit: Payer: Self-pay

## 2021-01-09 ENCOUNTER — Ambulatory Visit: Payer: BC Managed Care – PPO | Admitting: Physical Therapy

## 2021-01-09 ENCOUNTER — Encounter: Payer: Self-pay | Admitting: Physical Therapy

## 2021-01-09 DIAGNOSIS — M7541 Impingement syndrome of right shoulder: Secondary | ICD-10-CM | POA: Diagnosis not present

## 2021-01-09 DIAGNOSIS — G8929 Other chronic pain: Secondary | ICD-10-CM | POA: Diagnosis not present

## 2021-01-09 DIAGNOSIS — M25511 Pain in right shoulder: Secondary | ICD-10-CM | POA: Diagnosis not present

## 2021-01-09 DIAGNOSIS — M67911 Unspecified disorder of synovium and tendon, right shoulder: Secondary | ICD-10-CM | POA: Diagnosis not present

## 2021-01-09 NOTE — Therapy (Signed)
[facility] [address], [place], [address] Phone: [phone]   Fax:  [phone]  Physical Therapy Treatment  Patient Details  Name: Colleen Washington MRN: [id number] Date of Birth: 1961-04-26 Referring Provider (PT): tullo,teresa   Encounter Date: 01/09/2021   PT End of Session - 01/09/21 1801     Visit Number 3    Number of Visits 17    Date for PT Re-Evaluation 02/22/21    Activity Tolerance Patient tolerated treatment well;Patient limited by pain    Behavior During Therapy [facility] for tasks assessed/performed             Past Medical History:  Diagnosis Date   allergic rhinitis    Anxiety    AR (allergic rhinitis)    Asthma    Depression    Endometriosis    GERD (gastroesophageal reflux disease)    H/O migraine    Herniated intervertebral disc    History of diverticulitis of colon    Hyperlipidemia    Hypertension    IBS (irritable bowel syndrome)    Irritable bowel syndrome    Nonobstructive CAD (coronary artery disease)    a. 11/2019 Cor CTA: Cor Ca2+ = 141 (95th %'ile). LAD 25-49p.   Psoriasis    Tobacco abuse     Past Surgical History:  Procedure Laterality Date   ABDOMINAL HYSTERECTOMY  2003   tvhuso   BREAST BIOPSY Left 08/2014   benign-BENIGN BREAST TISSUE WITH FIBROSIS   DILATION AND CURETTAGE OF UTERUS      There were no vitals filed for this visit.   Subjective Assessment - 01/09/21 1717     Subjective Pt reports that her right arm is feeling better and that she notices a difference. The only thing really bothering her is reaching behind her back to undo her bra.    Pertinent History Reduction in rom and onset of pain coincides with lifting 4 weeks ago. Concern for shoulder impingement and/or rotator cuff injury. We will trial aggressive icing regimen, voltaren gel and if no improvement consult orthopedics. Pain closer to elbow itself raises suspicion for with overuse syndrome  , question lateral epicondylitis. Pending XRays. Follow up in 6 weeks (10/03/20)    Limitations Other (comment)   Laying down on shoulder or picking up heavy object   How long can you sit comfortably? No problems sitting    How long can you stand comfortably? No problems standing    How long can you walk comfortably? No problems walking    Diagnostic tests X-ray no remarkable results    Patient Stated Goals Wants to avoid shoulder surgery and place pressure on arm without feeling pain and liftt without feeling pain.    Currently in Pain? No/denies    Pain Onset More than a month ago            THEREX  Latissimus/Subscapularis Stretch 2 x 30 sec  -VC to position arm and rotate hips away from wall   Doorway stretch with 120 deg abduction 2 X 30 sec   Standing Resisted Eccentric Internal Rotation with Red Theraband 2x10  -Vcs to slow eccentric portion of exercise   Sidelying Eccentric Shoulder Internal Rotation with 4 lb  2 x 10  -Vcs to slow down lowering weight   Sidelying Eccentric Shoulder Internal Rotation with 6 lb 1 x 10   Towel Shoulder Stretch 1 x 30 sec  -Pt reports discomfort with stretch   Subscapularis  Trigger Point Release 5 min  -Educated pt on how to palpate subscapularis and apply pressure to trigger points  -Attempted with theracane too      PT Education - 01/09/21 1802     Education Details form/technique for exercises    Person(s) Educated Patient    Methods Explanation;Demonstration;Verbal cues    Comprehension Verbal cues required;Returned demonstration;Verbalized understanding              PT Short Term Goals - 01/09/21 1801       PT SHORT TERM GOAL #1   Title Pt will demonstrate understanding of HEP.    Time 2    Period Weeks    Status New    Target Date 01/11/21               PT Long Term Goals - 01/09/21 1801       PT LONG TERM GOAL #1   Title Pt will improve bilateral shoulder flexion AROM to >=140 degrees to be able to  perform overhead activities for job and for self-grooming.    Baseline 6/16: 115 bilateral    Time 8    Period Weeks    Status New      PT LONG TERM GOAL #2   Title Pt will improve combined IR of right shoulder to be symmetrical with left shoulder in order to perform self grooming activities.    Baseline 6/16:    Time 8    Period Weeks    Status New      PT LONG TERM GOAL #3   Title Pt will improve shoulder ER of right shoulder to be symmetrical with left shoulder to be able to perform overhead reaching activities for her job.    Baseline 6/16: Right shoulder ER 30    Time 8    Period Weeks    Status New      PT LONG TERM GOAL #4   Title Pt will improve FOTO score to target score to indicate clinically significant improvement with functional ability.    Baseline 6/16: 54, target score of 67    Time 8    Period Weeks    Status New            Access Code: [email or web address] URL: [email or web address] Date: 01/09/2021 Prepared by: Bradly Chris  Exercises Seated Upper Trapezius Stretch - 1 x daily - 7 x weekly - 1 sets - 5 reps - 30 hold Wall Push Up with Plus - 1 x daily - 3 x weekly - 3 sets - 10 reps Supine Chest Stretch on Swiss Ball - 1 x daily - 7 x weekly - 1 sets - 5 reps - 30 hold Latissimus Dorsi Stretch at Wall - 1 x daily - 7 x weekly - 1 sets - 5 reps - 30 hold Sidelying Shoulder Internal Rotation - 1 x daily - 7 x weekly - 3 sets - 10 reps Single Arm Doorway Pec Stretch at 120 Degrees Abduction - 1 x daily - 7 x weekly - 1 sets - 5 reps - 30 hold        Plan - 01/09/21 1803     Clinical Impression Statement Pt presents for follow up for right shoulder pain likely due to subscapularis tendinitis and subacromial impingement. Pt demonstrates a decrease in shoulder pain from prior sessions and an increase in shoulder strength with ability to perform eccentric shoulder internal rotation with increased wait without increasing her pain. She still  demonstrates limited combined  shoulder internal rotation with inability to reach thoracic spine. Pt will continue to demonstrate from skilled PT to progress shoulder internal rotation ROM and to decrease shoulder pain in order to perform self grooming activities such as dressing.    Personal Factors and Comorbidities Time since onset of injury/illness/exacerbation;Comorbidity 1    Comorbidities Depression    Examination-Activity Limitations Bed Mobility;Dressing;Sleep;Lift    Examination-Participation Restrictions Occupation    Stability/Clinical Decision Making Stable/Uncomplicated    Rehab Potential Good    PT Frequency 2x / week    PT Duration 8 weeks    PT Next Visit Plan Progress shoulder strengthening activities and reassess shoulder internal rotation    PT [place] code [place] and Agree with Plan of Care Patient             Patient will benefit from skilled therapeutic intervention in order to improve the following deficits and impairments:  Decreased range of motion, Decreased strength, Pain  Visit Diagnosis: Chronic right shoulder pain  Tendinopathy of right rotator cuff  Subacromial impingement of right shoulder     Problem List Patient Active Problem List   Diagnosis Date Noted   Moderate episode of recurrent major depressive disorder ([place]) 10/29/2020   Right arm pain 10/03/2020   History of COVID-19 07/28/2020   Chest pain of uncertain etiology 32/54/9826   Dyspnea on exertion 10/22/2019   Palpitations 10/22/2019   PAC (premature atrial contraction) 10/22/2019   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   SVT (supraventricular tachycardia) ([place]) 08/15/2018   Thyroid nodule 10/28/2016   Ovarian cyst, right 09/30/2015   Endometriosis 08/10/2015   Status post vaginal hysterectomy 08/10/2015   Anxiety 08/10/2015   Obesity (BMI 30.0-34.9) 08/10/2015   Vitamin D deficiency 05/26/2015   Weight gain 02/22/2015   Menopause  09/02/2014   Visit for preventive health examination 08/24/2013   Snoring 08/24/2013   Symptoms, such as flushing, sleeplessness, headache, lack of concentration, associated with the menopause 08/24/2013   Encounter for drug screening 08/13/2013   Migraine headache 08/26/2012   Panic disorder without agoraphobia with mild panic attacks 08/26/2012   Bronchitis, chronic ([place]) 05/09/2012   History of diverticulitis of colon    GERD (gastroesophageal reflux disease)    Hyperlipidemia    Irritable bowel syndrome    Essential hypertension    Herniated intervertebral disc    Tobacco use 03/04/2012   Bradly Chris PT, DPT  01/09/2021, 6:07 PM  [facility] [address] [place], [address] Phone: [phone]   Fax:  [phone]  Name: Colleen Washington MRN: [id number] Date of Birth: 03-31-1961

## 2021-01-10 ENCOUNTER — Encounter: Payer: BC Managed Care – PPO | Admitting: Physical Therapy

## 2021-01-17 ENCOUNTER — Encounter: Payer: BC Managed Care – PPO | Admitting: Physical Therapy

## 2021-01-17 ENCOUNTER — Ambulatory Visit: Payer: BC Managed Care – PPO | Attending: Internal Medicine | Admitting: Physical Therapy

## 2021-01-17 ENCOUNTER — Other Ambulatory Visit: Payer: Self-pay

## 2021-01-17 DIAGNOSIS — G8929 Other chronic pain: Secondary | ICD-10-CM | POA: Diagnosis not present

## 2021-01-17 DIAGNOSIS — M7541 Impingement syndrome of right shoulder: Secondary | ICD-10-CM | POA: Diagnosis not present

## 2021-01-17 DIAGNOSIS — M67911 Unspecified disorder of synovium and tendon, right shoulder: Secondary | ICD-10-CM | POA: Insufficient documentation

## 2021-01-17 DIAGNOSIS — M25511 Pain in right shoulder: Secondary | ICD-10-CM | POA: Insufficient documentation

## 2021-01-17 NOTE — Therapy (Signed)
[facility] [address] [place], [place], [address] Phone: [phone]   Fax:  [phone]  Physical Therapy Treatment  Patient Details  Name: Colleen Washington MRN: [id number] Date of Birth: [birth year] Referring Provider (PT): tullo,teresa   Encounter Date: 01/17/2021   PT End of Session - 01/17/21 1831     Visit Number 4    Number of Visits 17    Date for PT Re-Evaluation 02/22/21    PT Start Time 5364    PT Stop Time 1800    PT Time Calculation (min) 45 min    Activity Tolerance Patient tolerated treatment well;Patient limited by pain    Behavior During Therapy [facility] for tasks assessed/performed             Past Medical History:  Diagnosis Date   allergic rhinitis    Anxiety    AR (allergic rhinitis)    Asthma    Depression    Endometriosis    GERD (gastroesophageal reflux disease)    H/O migraine    Herniated intervertebral disc    History of diverticulitis of colon    Hyperlipidemia    Hypertension    IBS (irritable bowel syndrome)    Irritable bowel syndrome    Nonobstructive CAD (coronary artery disease)    a. 11/2019 Cor CTA: Cor Ca2+ = 141 (95th %'ile). LAD 25-49p.   Psoriasis    Tobacco abuse     Past Surgical History:  Procedure Laterality Date   ABDOMINAL HYSTERECTOMY  2003   tvhuso   BREAST BIOPSY Left 08/2014   benign-BENIGN BREAST TISSUE WITH FIBROSIS   DILATION AND CURETTAGE OF UTERUS      There were no vitals filed for this visit.   Subjective Assessment - 01/17/21 1717     Subjective Pt reports feeling stabbing feeling in front part of right shoulder at work yesterday. She is unsure of what is causing pain.    Pertinent History Reduction in rom and onset of pain coincides with lifting 4 weeks ago. Concern for shoulder impingement and/or rotator cuff injury. We will trial aggressive icing regimen, voltaren gel and if no improvement consult orthopedics. Pain closer to elbow itself  raises suspicion for with overuse syndrome , question lateral epicondylitis. Pending XRays. Follow up in 6 weeks (10/03/20)    Limitations Other (comment)   Laying down on shoulder or picking up heavy object   How long can you sit comfortably? No problems sitting    How long can you stand comfortably? No problems standing    How long can you walk comfortably? No problems walking    Diagnostic tests X-ray no remarkable results    Patient Stated Goals Wants to avoid shoulder surgery and place pressure on arm without feeling pain and liftt without feeling pain.    Currently in Pain? No/denies    Pain Onset More than a month ago            EVAL  Resisted R  Shoulder IR- Painful  Resisted R  Shoulder ER- Not painful  Manual   Biceps massage 2 min   THEREX:  Subscapularis Stretch on doorway  2 x 30 sec  -Pt reports increased pain  Subscapularis Stretch on table with ball 30 sec   Right Sidelying IR Eccentric 5 lb 1 x 10 RUE IR isometric 5 x 30 sec  D2 Flexion on RUE with R TB 1 x 10  -Pt reports increased pain  Seated  Rows with Blue TB 1 x 10    PT Education - 01/17/21 1830     Education Details form/technique for exercise    Person(s) Educated Patient    Methods Explanation;Demonstration;Tactile cues;Verbal cues;Handout    Comprehension Verbalized understanding;Returned demonstration              PT Short Term Goals - 01/17/21 1835       PT SHORT TERM GOAL #1   Title Pt will demonstrate understanding of HEP.    Time 2    Period Weeks    Status New    Target Date 01/11/21               PT Long Term Goals - 01/17/21 1836       PT LONG TERM GOAL #1   Title Pt will improve bilateral shoulder flexion AROM to >=140 degrees to be able to perform overhead activities for job and for self-grooming.    Baseline 6/16: 115 bilateral    Time 8    Period Weeks    Status New      PT LONG TERM GOAL #2   Title Pt will improve combined IR of right shoulder to be  symmetrical with left shoulder in order to perform self grooming activities.    Baseline 6/16:    Time 8    Period Weeks    Status New      PT LONG TERM GOAL #3   Title Pt will improve shoulder ER of right shoulder to be symmetrical with left shoulder to be able to perform overhead reaching activities for her job.    Baseline 6/16: Right shoulder ER 30    Time 8    Period Weeks    Status New      PT LONG TERM GOAL #4   Title Pt will improve FOTO score to target score to indicate clinically significant improvement with functional ability.    Baseline 6/16: 54, target score of 67    Time 8    Period Weeks    Status New                   Plan - 01/17/21 1835     Clinical Impression Statement Pt presents for follow up for right shoulder pain likely due to subscapularis tendinitis. She presents with increased pain with increased flexion and abduction above 90 degrees and resisted IR on right arm. Pt was limited during session by pain with overhead movement, and eccentric IR. She benefited from modifying exercises to include motions below 90 degrees of flexion and abduction and isometric IR strengthening. She will continue to benefit from skilled PT to progress shoulder AROM and reduce pain in order to complete dressing and overhead activities without experiencing pain.    Personal Factors and Comorbidities Time since onset of injury/illness/exacerbation;Comorbidity 1    Comorbidities Depression    Examination-Activity Limitations Bed Mobility;Dressing;Sleep;Lift    Examination-Participation Restrictions Occupation    Stability/Clinical Decision Making Stable/Uncomplicated    Rehab Potential Good    PT Frequency 2x / week    PT Duration 8 weeks    PT Next Visit Plan Reassess goals. Progress shoulder strengthening activities and reassess shoulder internal rotation. Attempt overhead activity.    PT [place] code [email or web address]    Consulted and Agree with Plan of Care  Patient            HEP includes:  Access Code: [email or web address] URL: [email or web address] Date: 01/17/2021 Prepared by:  Bradly Chris  Exercises Seated Upper Trapezius Stretch - 1 x daily - 7 x weekly - 1 sets - 5 reps - 30 hold Wall Push Up with Plus - 1 x daily - 3 x weekly - 3 sets - 10 reps Supine Chest Stretch on Swiss Ball - 1 x daily - 7 x weekly - 1 sets - 5 reps - 30 hold Latissimus Dorsi Stretch at Wall - 1 x daily - 7 x weekly - 1 sets - 5 reps - 30 hold Standing Isometric Shoulder Internal Rotation at Doorway - 1 x daily - 7 x weekly - 3 sets - 6 reps - 30 sec hold Seated Shoulder Row with Anchored Resistance - 1 x daily - 3 x weekly - 3 sets - 10 reps   Patient will benefit from skilled therapeutic intervention in order to improve the following deficits and impairments:  Decreased range of motion, Decreased strength, Pain  Visit Diagnosis: Chronic right shoulder pain  Tendinopathy of right rotator cuff  Subacromial impingement of right shoulder     Problem List Patient Active Problem List   Diagnosis Date Noted   Moderate episode of recurrent major depressive disorder ([place]) 10/29/2020   Right arm pain 10/03/2020   History of COVID-19 07/28/2020   Chest pain of uncertain etiology 84/13/2440   Dyspnea on exertion 10/22/2019   Palpitations 10/22/2019   PAC (premature atrial contraction) 10/22/2019   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   SVT (supraventricular tachycardia) ([place]) 08/15/2018   Thyroid nodule 10/28/2016   Ovarian cyst, right 09/30/2015   Endometriosis 08/10/2015   Status post vaginal hysterectomy 08/10/2015   Anxiety 08/10/2015   Obesity (BMI 30.0-34.9) 08/10/2015   Vitamin D deficiency 05/26/2015   Weight gain 02/22/2015   Menopause 09/02/2014   Visit for preventive health examination 08/24/2013   Snoring 08/24/2013   Symptoms, such as flushing, sleeplessness, headache, lack of concentration, associated with the menopause  08/24/2013   Encounter for drug screening 08/13/2013   Migraine headache 08/26/2012   Panic disorder without agoraphobia with mild panic attacks 08/26/2012   Bronchitis, chronic ([place]) 05/09/2012   History of diverticulitis of colon    GERD (gastroesophageal reflux disease)    Hyperlipidemia    Irritable bowel syndrome    Essential hypertension    Herniated intervertebral disc    Tobacco use 03/04/2012   Bradly Chris PT, DPT  01/17/2021, 6:39 PM  [facility] [address] [place], [address] Phone: [phone]   Fax:  [phone]  Name: Colleen Washington MRN: [id number] Date of Birth: [birth year]

## 2021-01-22 ENCOUNTER — Ambulatory Visit: Payer: BC Managed Care – PPO | Admitting: Physical Therapy

## 2021-01-22 ENCOUNTER — Other Ambulatory Visit: Payer: Self-pay | Admitting: Internal Medicine

## 2021-01-22 ENCOUNTER — Encounter: Payer: BC Managed Care – PPO | Admitting: Physical Therapy

## 2021-01-22 DIAGNOSIS — G8929 Other chronic pain: Secondary | ICD-10-CM | POA: Diagnosis not present

## 2021-01-22 DIAGNOSIS — M67911 Unspecified disorder of synovium and tendon, right shoulder: Secondary | ICD-10-CM

## 2021-01-22 DIAGNOSIS — M25511 Pain in right shoulder: Secondary | ICD-10-CM

## 2021-01-22 DIAGNOSIS — M7541 Impingement syndrome of right shoulder: Secondary | ICD-10-CM

## 2021-01-22 DIAGNOSIS — U071 COVID-19: Secondary | ICD-10-CM

## 2021-01-22 DIAGNOSIS — J40 Bronchitis, not specified as acute or chronic: Secondary | ICD-10-CM

## 2021-01-22 NOTE — Therapy (Addendum)
[facility] [address], [place], [address] Phone: [phone]   Fax:  [phone]  Physical Therapy Progress Note   Patient Details  Name: Colleen Washington MRN: [id number] Date of Birth: 06/04/1961 Referring Provider (PT): tullo,teresa   Encounter Date: 01/22/2021   PT End of Session - 01/22/21 1728     Visit Number 5    Number of Visits 17    Date for PT Re-Evaluation 02/22/21    PT Start Time 6203    PT Stop Time 1800    PT Time Calculation (min) 45 min    Activity Tolerance Patient tolerated treatment well;Patient limited by pain    Behavior During Therapy [facility] for tasks assessed/performed             Past Medical History:  Diagnosis Date   allergic rhinitis    Anxiety    AR (allergic rhinitis)    Asthma    Depression    Endometriosis    GERD (gastroesophageal reflux disease)    H/O migraine    Herniated intervertebral disc    History of diverticulitis of colon    Hyperlipidemia    Hypertension    IBS (irritable bowel syndrome)    Irritable bowel syndrome    Nonobstructive CAD (coronary artery disease)    a. 11/2019 Cor CTA: Cor Ca2+ = 141 (95th %'ile). LAD 25-49p.   Psoriasis    Tobacco abuse     Past Surgical History:  Procedure Laterality Date   ABDOMINAL HYSTERECTOMY  2003   tvhuso   BREAST BIOPSY Left 08/2014   benign-BENIGN BREAST TISSUE WITH FIBROSIS   DILATION AND CURETTAGE OF UTERUS      There were no vitals filed for this visit.   Subjective Assessment - 01/22/21 1726     Subjective Pt reports that her shoulder pain has gone down since last session, and that she only felt an increase in pain once despite using shoulder more for cleaning.    Pertinent History Reduction in rom and onset of pain coincides with lifting 4 weeks ago. Concern for shoulder impingement and/or rotator cuff injury. We will trial aggressive icing regimen, voltaren gel and if no improvement consult  orthopedics. Pain closer to elbow itself raises suspicion for with overuse syndrome , question lateral epicondylitis. Pending XRays. Follow up in 6 weeks (10/03/20)    Limitations Other (comment)   Laying down on shoulder or picking up heavy object   How long can you sit comfortably? No problems sitting    How long can you stand comfortably? No problems standing    How long can you walk comfortably? No problems walking    Diagnostic tests X-ray no remarkable results    Patient Stated Goals Wants to avoid shoulder surgery and place pressure on arm without feeling pain and liftt without feeling pain.    Currently in Pain? No/denies    Pain Score 0-No pain    Pain Onset More than a month ago            Physical Performance   Shoulder IR combined motion   R                         L   PSIS*                  T12  Shoulder ER combined motion   R  L C7                         C7   Shoulder Flex AROM   R                            L  130*                     140   *painful  THEREX:  Standing Lat Pull Down with Red TB 1 x 10  -Vcs to keep elbow straight   Seated Shoulder IR/ER at 0 deg abduction with Red TB 2 x 10  -TC to place towels under forearm to increase comfort and improve form   Supine Pec Stretch with half foam roll 2 x 30 sec  - Pt unable to place RUE  flat onto table due to pain   Seated Child Pose with table 5 x 30 sec  -VC to only go to point of pain and not beyond      Updated HEP and educated patient on changes to exercises and addition of new exercises      PT Education - 01/22/21 1728     Education Details form/technique for exercise    Person(s) Educated Patient    Methods Explanation;Verbal cues    Comprehension Verbalized understanding              PT Short Term Goals - 01/22/21 1730       PT SHORT TERM GOAL #1   Title Pt will demonstrate understanding of HEP.    Time 2    Period Weeks    Status Achieved     Target Date 01/11/21               PT Long Term Goals - 01/22/21 1730       PT LONG TERM GOAL #1   Title Pt will improve bilateral shoulder flexion AROM to >=140 degrees to be able to perform overhead activities for job and for self-grooming.    Baseline 6/16: 115 bilateral 7/11: R flex 130, L 140    Time 8    Period Weeks    Status Partially Met    Target Date 02/22/21      PT LONG TERM GOAL #2   Title Pt will improve combined IR of right shoulder to be symmetrical with left shoulder in order to perform self grooming activities.    Baseline 7/11: L T12, R PSIS    Time 8    Period Weeks    Status Partially Met    Target Date 02/22/21      PT LONG TERM GOAL #3   Title Pt will improve shoulder ER of right shoulder to be symmetrical with left shoulder to be able to perform overhead reaching activities for her job.    Baseline 6/16: Right shoulder ER 30    Time 8    Period Weeks    Status New    Target Date 02/22/21      PT LONG TERM GOAL #4   Title Pt will improve FOTO score to target score to indicate clinically significant improvement with functional ability.    Baseline 6/16: 56, target score of 67 7/11: 56, target is 67    Time 8    Period Weeks    Status On-going  Plan - 01/22/21 1729     Clinical Impression Statement Pt presents for f/u for right shoulder pain 2/2 to subscapularis tendinitis and re-eval of goal for monthly progress note. She demonstrates improvement in RUE shoulder AROM, but still is limited by pain on RUE when performing combined IR.  She also exhibits a limited subjective report of improvement with today's FOTO score remaining same as initial one. She will continue to benefit from skilled PT to improve RUE shoulder IR ROM and to decrease pain in order to complete self-care tasks like combing hair or dressing herself.    Personal Factors and Comorbidities Time since onset of injury/illness/exacerbation;Comorbidity 1     Comorbidities Depression    Examination-Activity Limitations Bed Mobility;Dressing;Sleep;Lift    Examination-Participation Restrictions Occupation    Stability/Clinical Decision Making Stable/Uncomplicated    Rehab Potential Good    PT Frequency 2x / week    PT Duration 8 weeks    PT Next Visit Plan Assess shoulder ER. Progress shoulder strengthening activities and reassess shoulder internal rotation. Attempt overhead activity.    PT [place] code [email or web address]    Consulted and Agree with Plan of Care Patient            Access Code: [email or web address] URL: [email or web address] Date: 01/22/2021 Prepared by: Bradly Chris  Exercises Seated Upper Trapezius Stretch - 1 x daily - 7 x weekly - 1 sets - 5 reps - 30 hold Wall Push Up with Plus - 1 x daily - 3 x weekly - 3 sets - 10 reps Seated Shoulder Row with Anchored Resistance - 1 x daily - 3 x weekly - 3 sets - 10 reps Standing Shoulder External and Internal Rotation AROM - 1 x daily - 3 x weekly - 2 sets - 10 reps Standing Lat Pull Down with Resistance - Elbows Bent - 1 x daily - 3 x weekly - 2 sets - 10 reps Seated Child's Pose with Table - 1 x daily - 7 x weekly - 1 sets - 5 reps - 30 hold    Patient will benefit from skilled therapeutic intervention in order to improve the following deficits and impairments:  Decreased range of motion, Decreased strength, Pain  Visit Diagnosis: Chronic right shoulder pain  Tendinopathy of right rotator cuff  Subacromial impingement of right shoulder     Problem List Patient Active Problem List   Diagnosis Date Noted   Moderate episode of recurrent major depressive disorder ([place]) 10/29/2020   Right arm pain 10/03/2020   History of COVID-19 07/28/2020   Chest pain of uncertain etiology 13/02/6577   Dyspnea on exertion 10/22/2019   Palpitations 10/22/2019   PAC (premature atrial contraction) 10/22/2019   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   SVT  (supraventricular tachycardia) (HCC) 08/15/2018   Thyroid nodule 10/28/2016   Ovarian cyst, right 09/30/2015   Endometriosis 08/10/2015   Status post vaginal hysterectomy 08/10/2015   Anxiety 08/10/2015   Obesity (BMI 30.0-34.9) 08/10/2015   Vitamin D deficiency 05/26/2015   Weight gain 02/22/2015   Menopause 09/02/2014   Visit for preventive health examination 08/24/2013   Snoring 08/24/2013   Symptoms, such as flushing, sleeplessness, headache, lack of concentration, associated with the menopause 08/24/2013   Encounter for drug screening 08/13/2013   Migraine headache 08/26/2012   Panic disorder without agoraphobia with mild panic attacks 08/26/2012   Bronchitis, chronic ([place]) 05/09/2012   History of diverticulitis of colon    GERD (gastroesophageal reflux disease)    Hyperlipidemia  Irritable bowel syndrome    Essential hypertension    Herniated intervertebral disc    Tobacco use 03/04/2012   Bradly Chris PT, DPT  01/22/2021, 6:17 PM  [facility] [address] [place], [address] Phone: [phone]   Fax:  [phone]  Name: Colleen Washington MRN: [id number] Date of Birth: 1961-06-12

## 2021-01-23 ENCOUNTER — Ambulatory Visit: Payer: BC Managed Care – PPO | Admitting: Dermatology

## 2021-01-23 ENCOUNTER — Other Ambulatory Visit: Payer: Self-pay

## 2021-01-23 ENCOUNTER — Other Ambulatory Visit: Payer: Self-pay | Admitting: Dermatology

## 2021-01-23 DIAGNOSIS — L409 Psoriasis, unspecified: Secondary | ICD-10-CM

## 2021-01-23 MED ORDER — SKYRIZI 150 MG/ML ~~LOC~~ SOSY: 150.0000 mg | PREFILLED_SYRINGE | SUBCUTANEOUS | 1 refills | Status: DC

## 2021-01-23 NOTE — Progress Notes (Signed)
Follow-Up Visit   Subjective  Colleen Washington is a 60 y.o. female who presents for the following: Psoriasis (Patient d/c'ed Soriatane stating that it caused her IBS to flare. She is also a week past due on her Cosentyx stating her insurance was reviewing coverage. She is still using the Tazorac 0.1% cream, Enstilar foam, Halobetasol cream, and OTC Zeasorb. ).  She still has significant involvement of her feet and would like to try and switch biologics. No hx of joint issues.   The following portions of the chart were reviewed this encounter and updated as appropriate:       Review of Systems:  No other skin or systemic complaints except as noted in HPI or Assessment and Plan.  Objective  Well appearing patient in no apparent distress; mood and affect are within normal limits.  A focused examination was performed including B/L hands and feet. Relevant physical exam findings are noted in the Assessment and Plan.  Hands and feet Palms clear, hyperkeratosis with erythema and fissuring R>L plantar feet and heels. Mild erythema and scale on the right palm.   BSA 6%  Assessment & Plan  Psoriasis Hands and feet  Persistent involvement on Cosentyx and topicals, unable to tolerate Soriatane  Psoriasis - severe on systemic "biologic" treatment injections.  Psoriasis is a chronic non-curable, but treatable genetic/hereditary disease that may have other systemic features affecting other organ systems such as joints (Psoriatic Arthritis).  It is linked with heart disease, inflammatory bowel disease, non-alcoholic fatty liver disease, and depression. Significant skin psoriasis and/or psoriatic arthritis may have significant symptoms and affects activities of daily activity and often benefits from systemic "biologic" injection treatments.  These "biologic" treatments have some potential side effects including immunosuppression and require pre-treatment laboratory screening and periodic laboratory  monitoring and periodic in person evaluation and monitoring by the attending dermatologist physician.     Discussed changing biologics - patient agrees and will plan to switch to [facility]  Cosentyx sample given to pt today to use while we undergo PA for [employer] Reviewed most recent labs and wnl. Will order TB screening test only  Continue Tazorac 0.1% cream QHS, Enstilar foam QAM, Halobetasol QHS, and Zeasorb AF powder.   Reviewed risks of biologics including immunosuppression, infections, injection site reaction, and failure to improve condition. Goal is control of skin condition, not cure.  Some older biologics such as Humira and Enbrel may slightly increase risk of malignancy and may worsen congestive heart failure. The use of biologics requires long term medication management, including periodic office visits and monitoring of blood work.   QuantiFERON-TB Gold Plus - Hands and feet  Risankizumab-rzaa (SKYRIZI) 150 MG/ML SOSY - Hands and feet Inject 150 mg into the skin as directed. At weeks 0 & 4.  Risankizumab-rzaa (SKYRIZI) 150 MG/ML SOSY - Hands and feet Inject 150 mg into the skin as directed. Every 12 weeks for maintenance.  Related Medications Calcipotriene-Betameth Diprop (ENSTILAR) 0.005-0.064 % FOAM Apply to affected areas palms and soles 1-2 times daily until improved. Avoid face, groin, underarms.  tazarotene (TAZORAC) 0.1 % cream Apply to hands and feet every night as needed for psoriasis.  acitretin (SORIATANE) 25 MG capsule Take 1 capsule (25 mg total) by mouth daily before breakfast.  halobetasol (ULTRAVATE) 0.05 % cream Apply together with tazarotene 0.1% cream 1-2 times a day as needed.  Return in 1 month (on 02/23/2021) for psoriasis pending Skyrizi approval .  I, Rudell Cobb, CMA, am acting as scribe for [employer]  Nicole Kindred, MD .  Documentation: I have reviewed the above documentation for accuracy and completeness, and I agree with the above.  Brendolyn Patty MD

## 2021-01-23 NOTE — Patient Instructions (Signed)

## 2021-01-24 ENCOUNTER — Encounter: Payer: BC Managed Care – PPO | Admitting: Physical Therapy

## 2021-01-24 ENCOUNTER — Ambulatory Visit: Payer: BC Managed Care – PPO | Admitting: Physical Therapy

## 2021-01-24 DIAGNOSIS — M67911 Unspecified disorder of synovium and tendon, right shoulder: Secondary | ICD-10-CM

## 2021-01-24 DIAGNOSIS — M7541 Impingement syndrome of right shoulder: Secondary | ICD-10-CM | POA: Diagnosis not present

## 2021-01-24 DIAGNOSIS — G8929 Other chronic pain: Secondary | ICD-10-CM | POA: Diagnosis not present

## 2021-01-24 DIAGNOSIS — M25511 Pain in right shoulder: Secondary | ICD-10-CM | POA: Diagnosis not present

## 2021-01-24 NOTE — Therapy (Signed)
[facility] [address], [place], [address] Phone: [phone]   Fax:  [phone]  Physical Therapy Treatment  Patient Details  Name: Colleen Washington MRN: [id number] Date of Birth: [birth year] Referring Provider (PT): tullo,teresa   Encounter Date: 01/24/2021   PT End of Session - 01/24/21 1805     Visit Number 6    Number of Visits 17    Date for PT Re-Evaluation 02/22/21    PT Start Time 6962    PT Stop Time 1750    PT Time Calculation (min) 35 min    Activity Tolerance Patient tolerated treatment well;Patient limited by pain    Behavior During Therapy [facility] for tasks assessed/performed             Past Medical History:  Diagnosis Date   allergic rhinitis    Anxiety    AR (allergic rhinitis)    Asthma    Depression    Endometriosis    GERD (gastroesophageal reflux disease)    H/O migraine    Herniated intervertebral disc    History of diverticulitis of colon    Hyperlipidemia    Hypertension    IBS (irritable bowel syndrome)    Irritable bowel syndrome    Nonobstructive CAD (coronary artery disease)    a. 11/2019 Cor CTA: Cor Ca2+ = 141 (95th %'ile). LAD 25-49p.   Psoriasis    Tobacco abuse     Past Surgical History:  Procedure Laterality Date   ABDOMINAL HYSTERECTOMY  2003   tvhuso   BREAST BIOPSY Left 08/2014   benign-BENIGN BREAST TISSUE WITH FIBROSIS   DILATION AND CURETTAGE OF UTERUS      There were no vitals filed for this visit.   Subjective Assessment - 01/24/21 1716     Subjective Pt reports that her shoulder pain has been about the same and that she has been able to do all her exercises without difficulty.    Pertinent History Reduction in rom and onset of pain coincides with lifting 4 weeks ago. Concern for shoulder impingement and/or rotator cuff injury. We will trial aggressive icing regimen, voltaren gel and if no improvement consult orthopedics. Pain closer to elbow  itself raises suspicion for with overuse syndrome , question lateral epicondylitis. Pending XRays. Follow up in 6 weeks (10/03/20)    Limitations Other (comment)   Laying down on shoulder or picking up heavy object   How long can you sit comfortably? No problems sitting    How long can you stand comfortably? No problems standing    How long can you walk comfortably? No problems walking    Diagnostic tests X-ray no remarkable results    Patient Stated Goals Wants to avoid shoulder surgery and place pressure on arm without feeling pain and liftt without feeling pain.    Currently in Pain? No/denies    Pain Score 0-No pain    Pain Onset More than a month ago            MANUAL:   Left Shoulder Abduction Stretch    Inferior Glides 3 x 15 Grade II-III  Anterior to Posterior Glides 3 x 15 Grade II-III    THEREX:   Supine Shoulder Internal and External Rotation with Dumbbell #4 - 3 x 10  -TC to adjust shoulder to 45 deg of abduction   Seated Shoulder IR and ER with 4 lb weight 2 x 10 at 45 deg abd  Seated Shoulder IR  with RTB 1 x 10 at 45 deg abd  Seated Shoulder IR with GTB 1 x 10 at 45 deg abd  Seated Shoulder Abduction Towel Slide at Table Top - 3 x 10   Single Arm Serratus Punches in Supine with Dumbbell - 3 x 15 -VC to keep elbow straight    Updated HEP and educated patient on changes to exercises and addition of new exercises      PT Short Term Goals - 01/22/21 1730       PT SHORT TERM GOAL #1   Title Pt will demonstrate understanding of HEP.    Time 2    Period Weeks    Status Achieved    Target Date 01/11/21               PT Long Term Goals - 01/22/21 1730       PT LONG TERM GOAL #1   Title Pt will improve bilateral shoulder flexion AROM to >=140 degrees to be able to perform overhead activities for job and for self-grooming.    Baseline 6/16: 115 bilateral 7/11: R flex 130, L 140    Time 8    Period Weeks    Status Partially Met    Target Date  02/22/21      PT LONG TERM GOAL #2   Title Pt will improve combined IR of right shoulder to be symmetrical with left shoulder in order to perform self grooming activities.    Baseline 7/11: L T12, R PSIS    Time 8    Period Weeks    Status Partially Met    Target Date 02/22/21      PT LONG TERM GOAL #3   Title Pt will improve shoulder ER of right shoulder to be symmetrical with left shoulder to be able to perform overhead reaching activities for her job.    Baseline 6/16: Right shoulder ER 30    Time 8    Period Weeks    Status New    Target Date 02/22/21      PT LONG TERM GOAL #4   Title Pt will improve FOTO score to target score to indicate clinically significant improvement with functional ability.    Baseline 6/16: 56, target score of 67 7/11: 56, target is 67    Time 8    Period Weeks    Status On-going                   Plan - 01/24/21 1724     Clinical Impression Statement Pt presents for f/u  for right shoulder pain 2/2 to subacromial impingement. She demonstrates improved shoulder strength with ability to perform ER and IR exercises at 45 degrees of shoulder abduction without experiencing increased pain. She is still experiencing signficant pain in discomfort with shoulder abduction of RUE past 90 degrees and she will continue to benefit from PT to improve R shoulder ROM and strength and to decrease right shoulder pain to complete self-care tasks like dressing in pain free range.    Personal Factors and Comorbidities Time since onset of injury/illness/exacerbation;Comorbidity 1    Comorbidities Depression    Examination-Activity Limitations Bed Mobility;Dressing;Sleep;Lift    Examination-Participation Restrictions Occupation    Stability/Clinical Decision Making Stable/Uncomplicated    Rehab Potential Good    PT Frequency 2x / week    PT Duration 8 weeks    PT Next Visit Plan Assess shoulder ER and Abduction. Progress ER IR strengthening above 45 deg abduction.       PT [place] code [id number]    Consulted and Agree with Plan of Care Patient            HEP includes:   Access Code: [email or web address] URL: [email or web address] Date: 01/24/2021 Prepared by: Bradly Chris  Exercises Seated Upper Trapezius Stretch - 1 x daily - 7 x weekly - 1 sets - 5 reps - 30 hold Seated Shoulder Row with Anchored Resistance - 1 x daily - 3 x weekly - 3 sets - 10 reps Standing Shoulder External and Internal Rotation AROM - 1 x daily - 3 x weekly - 2 sets - 10 reps Standing Lat Pull Down with Resistance - Elbows Bent - 1 x daily - 3 x weekly - 2 sets - 10 reps Seated Child's Pose with Table - 1 x daily - 7 x weekly - 1 sets - 5 reps - 30 hold Supine Shoulder External Rotation with Dumbbell - 1 x daily - 3 x weekly - 3 sets - 10 reps Seated Shoulder Abduction Towel Slide at Table Top - 1 x daily - 3 x weekly - 3 sets - 10 reps Single Arm Serratus Punches in Supine with Dumbbell - 1 x daily - 3 x weekly - 3 sets - 15 reps   Patient will benefit from skilled therapeutic intervention in order to improve the following deficits and impairments:  Decreased range of motion, Decreased strength, Pain  Visit Diagnosis: Chronic right shoulder pain  Tendinopathy of right rotator cuff  Subacromial impingement of right shoulder     Problem List Patient Active Problem List   Diagnosis Date Noted   Moderate episode of recurrent major depressive disorder ([place]) 10/29/2020   Right arm pain 10/03/2020   History of COVID-19 07/28/2020   Chest pain of uncertain etiology 14/78/2956   Dyspnea on exertion 10/22/2019   Palpitations 10/22/2019   PAC (premature atrial contraction) 10/22/2019   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   SVT (supraventricular tachycardia) (HCC) 08/15/2018   Thyroid nodule 10/28/2016   Ovarian cyst, right 09/30/2015   Endometriosis 08/10/2015   Status post vaginal hysterectomy 08/10/2015   Anxiety 08/10/2015    Obesity (BMI 30.0-34.9) 08/10/2015   Vitamin D deficiency 05/26/2015   Weight gain 02/22/2015   Menopause 09/02/2014   Visit for preventive health examination 08/24/2013   Snoring 08/24/2013   Symptoms, such as flushing, sleeplessness, headache, lack of concentration, associated with the menopause 08/24/2013   Encounter for drug screening 08/13/2013   Migraine headache 08/26/2012   Panic disorder without agoraphobia with mild panic attacks 08/26/2012   Bronchitis, chronic ([place]) 05/09/2012   History of diverticulitis of colon    GERD (gastroesophageal reflux disease)    Hyperlipidemia    Irritable bowel syndrome    Essential hypertension    Herniated intervertebral disc    Tobacco use 03/04/2012    Daneil Dan 01/24/2021, 6:05 PM  [facility] [address] [place], [address] Phone: [phone]   Fax:  [phone]  Name: Colleen Washington MRN: [id number] Date of Birth: [birth year]

## 2021-01-29 ENCOUNTER — Ambulatory Visit: Payer: BC Managed Care – PPO | Admitting: Physical Therapy

## 2021-01-29 ENCOUNTER — Other Ambulatory Visit: Payer: Self-pay

## 2021-01-29 ENCOUNTER — Encounter: Payer: BC Managed Care – PPO | Admitting: Physical Therapy

## 2021-01-29 DIAGNOSIS — M67911 Unspecified disorder of synovium and tendon, right shoulder: Secondary | ICD-10-CM

## 2021-01-29 DIAGNOSIS — M25511 Pain in right shoulder: Secondary | ICD-10-CM

## 2021-01-29 DIAGNOSIS — G8929 Other chronic pain: Secondary | ICD-10-CM

## 2021-01-29 DIAGNOSIS — M7541 Impingement syndrome of right shoulder: Secondary | ICD-10-CM

## 2021-01-29 NOTE — Therapy (Signed)
[facility] [address], [place], [address] Phone: [phone]   Fax:  [phone]  Physical Therapy Treatment  Patient Details  Name: Colleen Washington MRN: [id number] Date of Birth: 12-Nov-1960 Referring Provider (PT): tullo,teresa   Encounter Date: 01/29/2021   PT End of Session - 01/29/21 1737     Visit Number 7    Number of Visits 17    Date for PT Re-Evaluation 02/22/21    PT Start Time 8416    PT Stop Time 1800    PT Time Calculation (min) 45 min    Activity Tolerance Patient tolerated treatment well;Patient limited by pain    Behavior During Therapy [facility] for tasks assessed/performed             Past Medical History:  Diagnosis Date   allergic rhinitis    Anxiety    AR (allergic rhinitis)    Asthma    Depression    Endometriosis    GERD (gastroesophageal reflux disease)    H/O migraine    Herniated intervertebral disc    History of diverticulitis of colon    Hyperlipidemia    Hypertension    IBS (irritable bowel syndrome)    Irritable bowel syndrome    Nonobstructive CAD (coronary artery disease)    a. 11/2019 Cor CTA: Cor Ca2+ = 141 (95th %'ile). LAD 25-49p.   Psoriasis    Tobacco abuse     Past Surgical History:  Procedure Laterality Date   ABDOMINAL HYSTERECTOMY  2003   tvhuso   BREAST BIOPSY Left 08/2014   benign-BENIGN BREAST TISSUE WITH FIBROSIS   DILATION AND CURETTAGE OF UTERUS      There were no vitals filed for this visit.   Subjective Assessment - 01/29/21 1720     Subjective Pt reports lifting object at work and not experiencing pain.    Pertinent History Reduction in rom and onset of pain coincides with lifting 4 weeks ago. Concern for shoulder impingement and/or rotator cuff injury. We will trial aggressive icing regimen, voltaren gel and if no improvement consult orthopedics. Pain closer to elbow itself raises suspicion for with overuse syndrome , question lateral  epicondylitis. Pending XRays. Follow up in 6 weeks (10/03/20)    Limitations Other (comment)   Laying down on shoulder or picking up heavy object   How long can you sit comfortably? No problems sitting    How long can you stand comfortably? No problems standing    How long can you walk comfortably? No problems walking    Diagnostic tests X-ray no remarkable results    Patient Stated Goals Wants to avoid shoulder surgery and place pressure on arm without feeling pain and liftt without feeling pain.    Currently in Pain? No/denies    Pain Score 0-No pain    Pain Onset More than a month ago            PHYSICAL PERFORMANCE:                                              [place]  Shoulder ER R/L: 50/80      90 Shoulder IR R/L: 90/90        70   THEREX:  Sleeper Stretch 5 x 30 sec  Shoulder IR/ER at 45 deg Abduction with Red TB  Biceps Stretch 5 x 30 sec  Standing Shoulder Flexion AAROM with Red Swiss Ball on Table 2 x 10 Standing Shoulder Abduction AAROM with Red Swiss Ball on Table 2 x 10    Updated HEP and educated patient on changes to exercises and addition of new exercises     PT Short Term Goals - 01/29/21 1739       PT SHORT TERM GOAL #1   Title Pt will demonstrate understanding of HEP.    Time 2    Period Weeks    Status Achieved    Target Date 01/11/21               PT Long Term Goals - 01/29/21 1739       PT LONG TERM GOAL #1   Title Pt will improve bilateral shoulder flexion AROM to >=140 degrees to be able to perform overhead activities for job and for self-grooming.    Baseline 6/16: 115 bilateral 7/11: R flex 130, L 140    Time 8    Period Weeks    Status Partially Met      PT LONG TERM GOAL #2   Title Pt will improve combined IR of right shoulder to be symmetrical with left shoulder in order to perform self grooming activities.    Baseline 7/11: L T12, R PSIS    Time 8    Period Weeks    Status Partially Met      PT LONG TERM GOAL #3   Title  Pt will improve shoulder ER of right shoulder to be symmetrical with left shoulder to be able to perform overhead reaching activities for her job.    Baseline 6/16: Right shoulder ER 30 7/18: R ER 50 L IR 80    Time 8    Period Weeks    Status New      PT LONG TERM GOAL #4   Title Pt will improve FOTO score to target score to indicate clinically significant improvement with functional ability.    Baseline 6/16: 56, target score of 67 7/11: 56, target is 67    Time 8    Period Weeks    Status On-going                   Plan - 01/29/21 1723     Clinical Impression Statement Pt presents for f/u for right shoulder pain 2/2 to subacromial impingement. She exhibits    Personal Factors and Comorbidities Time since onset of injury/illness/exacerbation;Comorbidity 1    Comorbidities Depression    Examination-Activity Limitations Bed Mobility;Dressing;Sleep;Lift    Examination-Participation Restrictions Occupation    Stability/Clinical Decision Making Stable/Uncomplicated    Rehab Potential Good    PT Frequency 2x / week    PT Duration 8 weeks    PT Next Visit Plan Progress shoulder ER/IR within available abduction ROM and periscapular strengthening exercises    PT [place] code [email or web address]    Consulted and Agree with Plan of Care Patient            HEP includes:   Access Code: [email or web address] URL: [email or web address] Date: 01/29/2021 Prepared by: Bradly Chris  Exercises Seated Upper Trapezius Stretch - 1 x daily - 7 x weekly - 1 sets - 5 reps - 30 hold Seated Shoulder Row with Anchored Resistance - 1 x daily - 3 x weekly - 3 sets - 10 reps Standing Shoulder External and Internal Rotation AROM - 1 x daily - 3 x  weekly - 2 sets - 10 reps Standing Lat Pull Down with Resistance - Elbows Bent - 1 x daily - 3 x weekly - 2 sets - 10 reps Supine Shoulder External Rotation with Dumbbell - 1 x daily - 3 x weekly - 3 sets - 10 reps Single Arm Serratus  Punches in Supine with Dumbbell - 1 x daily - 3 x weekly - 3 sets - 15 reps Sleeper Stretch - 1 x daily - 7 x weekly - 1 sets - 5 reps - 30 hold Standing Shoulder Flexion AAROM with Swiss Ball - 1 x daily - 7 x weekly - 2 sets - 10 reps - 2 hold Standing Shoulder Abduction AAROM with Swiss Ball on Table - 1 x daily - 7 x weekly - 2 sets - 10 reps - 2 hold Standing Shoulder External Rotation with Resistance at 45 Degrees of Abduction - 1 x daily - 3 x weekly - 2 sets - 10 reps   Patient will benefit from skilled therapeutic intervention in order to improve the following deficits and impairments:  Decreased range of motion, Decreased strength, Pain  Visit Diagnosis: Chronic right shoulder pain  Tendinopathy of right rotator cuff  Subacromial impingement of right shoulder     Problem List Patient Active Problem List   Diagnosis Date Noted   Moderate episode of recurrent major depressive disorder ([place]) 10/29/2020   Right arm pain 10/03/2020   History of COVID-19 07/28/2020   Chest pain of uncertain etiology 16/04/9603   Dyspnea on exertion 10/22/2019   Palpitations 10/22/2019   PAC (premature atrial contraction) 10/22/2019   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   SVT (supraventricular tachycardia) (HCC) 08/15/2018   Thyroid nodule 10/28/2016   Ovarian cyst, right 09/30/2015   Endometriosis 08/10/2015   Status post vaginal hysterectomy 08/10/2015   Anxiety 08/10/2015   Obesity (BMI 30.0-34.9) 08/10/2015   Vitamin D deficiency 05/26/2015   Weight gain 02/22/2015   Menopause 09/02/2014   Visit for preventive health examination 08/24/2013   Snoring 08/24/2013   Symptoms, such as flushing, sleeplessness, headache, lack of concentration, associated with the menopause 08/24/2013   Encounter for drug screening 08/13/2013   Migraine headache 08/26/2012   Panic disorder without agoraphobia with mild panic attacks 08/26/2012   Bronchitis, chronic ([place]) 05/09/2012   History of  diverticulitis of colon    GERD (gastroesophageal reflux disease)    Hyperlipidemia    Irritable bowel syndrome    Essential hypertension    Herniated intervertebral disc    Tobacco use 03/04/2012   Bradly Chris PT, DPT  01/30/2021, 1:19 PM  [facility] [address], [place], [address] Phone: [phone]   Fax:  [phone]  Name: MARVIN MAENZA MRN: [id number] Date of Birth: [birth year]

## 2021-01-30 ENCOUNTER — Other Ambulatory Visit: Payer: Self-pay | Admitting: Nurse Practitioner

## 2021-01-30 NOTE — Telephone Encounter (Signed)
Please schedule overdue F/U for further refills. Thank you!

## 2021-01-30 NOTE — Telephone Encounter (Signed)
LMOV to schedule  

## 2021-01-31 ENCOUNTER — Ambulatory Visit: Payer: BC Managed Care – PPO | Admitting: Physical Therapy

## 2021-01-31 ENCOUNTER — Encounter: Payer: BC Managed Care – PPO | Admitting: Physical Therapy

## 2021-01-31 DIAGNOSIS — M25511 Pain in right shoulder: Secondary | ICD-10-CM

## 2021-01-31 DIAGNOSIS — G8929 Other chronic pain: Secondary | ICD-10-CM | POA: Diagnosis not present

## 2021-01-31 DIAGNOSIS — M7541 Impingement syndrome of right shoulder: Secondary | ICD-10-CM | POA: Diagnosis not present

## 2021-01-31 DIAGNOSIS — M67911 Unspecified disorder of synovium and tendon, right shoulder: Secondary | ICD-10-CM | POA: Diagnosis not present

## 2021-01-31 NOTE — Therapy (Signed)
[facility] [address] [place], [place], [address] Phone: [phone]   Fax:  [phone]  Physical Therapy Treatment  Patient Details  Name: Colleen Washington MRN: [id number] Date of Birth: [birth year] Referring Provider (PT): tullo,teresa   Encounter Date: 01/31/2021   PT End of Session - 02/01/21 1534     Visit Number 9    Number of Visits 17    Date for PT Re-Evaluation 02/22/21    PT Start Time 1448    PT Stop Time 1800    PT Time Calculation (min) 40 min    Activity Tolerance Patient tolerated treatment well;Patient limited by pain    Behavior During Therapy [facility] for tasks assessed/performed             Past Medical History:  Diagnosis Date   allergic rhinitis    Anxiety    AR (allergic rhinitis)    Asthma    Depression    Endometriosis    GERD (gastroesophageal reflux disease)    H/O migraine    Herniated intervertebral disc    History of diverticulitis of colon    Hyperlipidemia    Hypertension    IBS (irritable bowel syndrome)    Irritable bowel syndrome    Nonobstructive CAD (coronary artery disease)    a. 11/2019 Cor CTA: Cor Ca2+ = 141 (95th %'ile). LAD 25-49p.   Psoriasis    Tobacco abuse     Past Surgical History:  Procedure Laterality Date   ABDOMINAL HYSTERECTOMY  2003   tvhuso   BREAST BIOPSY Left 08/2014   benign-BENIGN BREAST TISSUE WITH FIBROSIS   DILATION AND CURETTAGE OF UTERUS      There were no vitals filed for this visit.   Subjective Assessment - 01/31/21 1722     Subjective Pt reports no pain with right shoulder unless she lays on it.    Pertinent History Reduction in rom and onset of pain coincides with lifting 4 weeks ago. Concern for shoulder impingement and/or rotator cuff injury. We will trial aggressive icing regimen, voltaren gel and if no improvement consult orthopedics. Pain closer to elbow itself raises suspicion for with overuse syndrome , question lateral  epicondylitis. Pending XRays. Follow up in 6 weeks (10/03/20)    Limitations Other (comment)   Laying down on shoulder or picking up heavy object   How long can you sit comfortably? No problems sitting    How long can you stand comfortably? No problems standing    How long can you walk comfortably? No problems walking    Diagnostic tests X-ray no remarkable results    Patient Stated Goals Wants to avoid shoulder surgery and place pressure on arm without feeling pain and liftt without feeling pain.    Currently in Pain? No/denies    Pain Score 0-No pain    Pain Onset More than a month ago             THEREX:  Physioball Rolls Flex/Ext 2 x 10   Physioball Roll Abd/Add 2 x 10   Eccentric Shoulder Flexion with Yellow TB 1 x 10  -VC to maintain elbow ext  Shoulder Resisted ER with 45 deg Abduct with Yellow TB 2 x 10  -VC to maintain wrist position facing wall   Standing Ts  -TC with hand to cue scapular retraction   Serratus Punches 2 x 10 -Vcs to maintain elbow extension   Updated HEP and educated patient on changes to  exercises and addition of new exercises      PT Short Term Goals - 01/31/21 1730       PT SHORT TERM GOAL #1   Title Pt will demonstrate understanding of HEP.    Time 2    Period Weeks    Status Achieved    Target Date 01/11/21               PT Long Term Goals - 01/31/21 1730       PT LONG TERM GOAL #1   Title Pt will improve bilateral shoulder flexion AROM to >=140 degrees to be able to perform overhead activities for job and for self-grooming.    Baseline 6/16: 115 bilateral 7/11: R flex 130, L 140    Time 8    Period Weeks    Status Partially Met      PT LONG TERM GOAL #2   Title Pt will improve combined IR of right shoulder to be symmetrical with left shoulder in order to perform self grooming activities.    Baseline 7/11: L T12, R PSIS    Time 8    Period Weeks    Status Partially Met      PT LONG TERM GOAL #3   Title Pt will  improve shoulder ER of right shoulder to be symmetrical with left shoulder to be able to perform overhead reaching activities for her job.    Baseline 6/16: Right shoulder ER 30 7/18: R ER 50 L IR 80    Time 8    Period Weeks    Status New      PT LONG TERM GOAL #4   Title Pt will improve FOTO score to target score to indicate clinically significant improvement with functional ability.    Baseline 6/16: 56, target score of 67 7/11: 56, target is 67    Time 8    Period Weeks    Status On-going                   Plan - 01/31/21 1731     Clinical Impression Statement Pt presents for f/u for R shoulder pain 2/2 to SAIS. She exhibits increased shoulder strength with ability to perform ER at a higher degree of abdruction 80 degrees compared to last session. She also reports a decreased about of pain in shoulder and only feels it when sleeping on it. Pt will continue to benefit from skilled PT to progress shoulder ROM and strength in order to perform overhead lifting activities for her job and for combing her hair without feeling pain.    Personal Factors and Comorbidities Time since onset of injury/illness/exacerbation;Comorbidity 1    Comorbidities Depression    Examination-Activity Limitations Bed Mobility;Dressing;Sleep;Lift    Examination-Participation Restrictions Occupation    Stability/Clinical Decision Making Stable/Uncomplicated    Rehab Potential Good    PT Frequency 2x / week    PT Duration 8 weeks    PT Next Visit Plan Reassess goals for progress note. Progress shoulder strengthening to include D2 flexion resisted ER at or above 90    PT [place] code [place] and Agree with Plan of Care Patient            HEP includes:  Access Code: [email or web address] URL: [email or web address] Date: 01/31/2021 Prepared by: Bradly Chris  Exercises Seated Upper Trapezius Stretch - 1 x daily - 7 x weekly - 1 sets - 5 reps - 30  hold Seated  Shoulder Row with Anchored Resistance - 1 x daily - 3 x weekly - 3 sets - 10 reps Standing Lat Pull Down with Resistance - Elbows Bent - 1 x daily - 3 x weekly - 2 sets - 10 reps Single Arm Serratus Punches in Supine with Dumbbell - 1 x daily - 3 x weekly - 3 sets - 15 reps Sleeper Stretch - 1 x daily - 7 x weekly - 1 sets - 5 reps - 30 hold Standing Shoulder Flexion AAROM with Swiss Ball - 1 x daily - 7 x weekly - 2 sets - 10 reps - 2 hold Standing Shoulder Abduction AAROM with Swiss Ball on Table - 1 x daily - 7 x weekly - 2 sets - 10 reps - 2 hold Standing Shoulder External Rotation with Resistance at 45 Degrees of Abduction - 1 x daily - 3 x weekly - 2 sets - 10 reps Standing Shoulder Horizontal Abduction with Resistance - 1 x daily - 3 x weekly - 2 sets - 10 reps Standing Shoulder Flexion with Resistance - 1 x daily - 3 x weekly - 2 sets - 10 reps   Patient will benefit from skilled therapeutic intervention in order to improve the following deficits and impairments:  Decreased range of motion, Decreased strength, Pain  Visit Diagnosis: Chronic right shoulder pain  Tendinopathy of right rotator cuff  Subacromial impingement of right shoulder     Problem List Patient Active Problem List   Diagnosis Date Noted   Moderate episode of recurrent major depressive disorder ([place]) 10/29/2020   Right arm pain 10/03/2020   History of COVID-19 07/28/2020   Chest pain of uncertain etiology 38/04/1750   Dyspnea on exertion 10/22/2019   Palpitations 10/22/2019   PAC (premature atrial contraction) 10/22/2019   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   SVT (supraventricular tachycardia) (HCC) 08/15/2018   Thyroid nodule 10/28/2016   Ovarian cyst, right 09/30/2015   Endometriosis 08/10/2015   Status post vaginal hysterectomy 08/10/2015   Anxiety 08/10/2015   Obesity (BMI 30.0-34.9) 08/10/2015   Vitamin D deficiency 05/26/2015   Weight gain 02/22/2015   Menopause 09/02/2014    Visit for preventive health examination 08/24/2013   Snoring 08/24/2013   Symptoms, such as flushing, sleeplessness, headache, lack of concentration, associated with the menopause 08/24/2013   Encounter for drug screening 08/13/2013   Migraine headache 08/26/2012   Panic disorder without agoraphobia with mild panic attacks 08/26/2012   Bronchitis, chronic ([place]) 05/09/2012   History of diverticulitis of colon    GERD (gastroesophageal reflux disease)    Hyperlipidemia    Irritable bowel syndrome    Essential hypertension    Herniated intervertebral disc    Tobacco use 03/04/2012   Bradly Chris PT, DPT  02/01/2021, 3:36 PM  [facility] [address], [place], [address] Phone: [phone]   Fax:  [phone]  Name: DANNON PERLOW MRN: [id number] Date of Birth: 22-Feb-1961

## 2021-02-05 ENCOUNTER — Encounter: Payer: BC Managed Care – PPO | Admitting: Physical Therapy

## 2021-02-05 ENCOUNTER — Ambulatory Visit: Payer: BC Managed Care – PPO | Admitting: Physical Therapy

## 2021-02-05 DIAGNOSIS — Z800 Family history of malignant neoplasm of digestive organs: Secondary | ICD-10-CM

## 2021-02-07 ENCOUNTER — Encounter: Payer: BC Managed Care – PPO | Admitting: Physical Therapy

## 2021-02-07 ENCOUNTER — Ambulatory Visit: Payer: BC Managed Care – PPO | Admitting: Physical Therapy

## 2021-02-07 DIAGNOSIS — M25511 Pain in right shoulder: Secondary | ICD-10-CM

## 2021-02-07 DIAGNOSIS — G8929 Other chronic pain: Secondary | ICD-10-CM

## 2021-02-07 DIAGNOSIS — M7541 Impingement syndrome of right shoulder: Secondary | ICD-10-CM | POA: Diagnosis not present

## 2021-02-07 DIAGNOSIS — M67911 Unspecified disorder of synovium and tendon, right shoulder: Secondary | ICD-10-CM

## 2021-02-07 NOTE — Therapy (Signed)
[facility] [address], [place], [address] Phone: [phone]   Fax:  [phone]  Physical Therapy Treatment  Patient Details  Name: NEKEYA BRISKI MRN: [id number] Date of Birth: [birth year] Referring Provider (PT): tullo,teresa   Encounter Date: 02/07/2021   PT End of Session - 02/07/21 1726     Visit Number 10    Number of Visits 17    Date for PT Re-Evaluation 02/22/21    PT Start Time 6222    PT Stop Time 1800    PT Time Calculation (min) 40 min    Activity Tolerance Patient tolerated treatment well;Patient limited by pain    Behavior During Therapy [facility] for tasks assessed/performed             Past Medical History:  Diagnosis Date   allergic rhinitis    Anxiety    AR (allergic rhinitis)    Asthma    Depression    Endometriosis    GERD (gastroesophageal reflux disease)    H/O migraine    Herniated intervertebral disc    History of diverticulitis of colon    Hyperlipidemia    Hypertension    IBS (irritable bowel syndrome)    Irritable bowel syndrome    Nonobstructive CAD (coronary artery disease)    a. 11/2019 Cor CTA: Cor Ca2+ = 141 (95th %'ile). LAD 25-49p.   Psoriasis    Tobacco abuse     Past Surgical History:  Procedure Laterality Date   ABDOMINAL HYSTERECTOMY  2003   tvhuso   BREAST BIOPSY Left 08/2014   benign-BENIGN BREAST TISSUE WITH FIBROSIS   DILATION AND CURETTAGE OF UTERUS      There were no vitals filed for this visit.   Subjective Assessment - 02/07/21 1724     Subjective Reports no pain and that exercises are going well.    Pertinent History Reduction in rom and onset of pain coincides with lifting 4 weeks ago. Concern for shoulder impingement and/or rotator cuff injury. We will trial aggressive icing regimen, voltaren gel and if no improvement consult orthopedics. Pain closer to elbow itself raises suspicion for with overuse syndrome , question lateral  epicondylitis. Pending XRays. Follow up in 6 weeks (10/03/20)    Limitations Other (comment)   Laying down on shoulder or picking up heavy object   How long can you sit comfortably? No problems sitting    How long can you stand comfortably? No problems standing    How long can you walk comfortably? No problems walking    Diagnostic tests X-ray no remarkable results    Patient Stated Goals Wants to avoid shoulder surgery and place pressure on arm without feeling pain and liftt without feeling pain.    Currently in Pain? No/denies    Pain Onset More than a month ago            PHYSICAL PERFORMANCE:  Shoulder ROM   Flexion R/L: 130/140 Abduction R/L: 140/140  ER at 90 deg abduc R/L: 40*/50    Combined IR R/L: PSIS/T12   *= painful   THEREX:   Red Ball Rolls Flexion 2 x 10  Red Ball Rolls Abduction 2 x 10  Shoulder Clocks with YTB 2 x 10  -VC for sequencing exercise  Shoulder IR Stretch on RUE 2 x 30 sec     Reviewed home exercises with patient and clarified any confusion.   Added Standing Shoulder IR stretch  -Mod VC to sequence  exercise      PT Short Term Goals - 02/07/21 1727       PT SHORT TERM GOAL #1   Title Pt will demonstrate understanding of HEP.    Time 2    Period Weeks    Status Achieved    Target Date 01/11/21               PT Long Term Goals - 02/07/21 1727       PT LONG TERM GOAL #1   Title Pt will improve bilateral shoulder flexion AROM to >=140 degrees to be able to perform overhead activities for job and for self-grooming.    Baseline 6/16: 115 bilateral 7/11: R flex 130, L 140 7/27: R/L abduction 140/140    Time 8    Period Weeks    Status Partially Met      PT LONG TERM GOAL #2   Title Pt will improve combined IR of right shoulder to be symmetrical with left shoulder in order to perform self grooming activities.    Baseline 7/11: L T12, R PSIS 7/27: L T12, R PSIS    Time 8    Period Weeks    Status Partially Met      PT  LONG TERM GOAL #3   Title Pt will improve shoulder ER of right shoulder to be symmetrical with left shoulder to be able to perform overhead reaching activities for her job.    Baseline 6/16: Right shoulder ER 30 7/18: R ER 50 L IR 80 7/27: R/L 40/50    Time 8    Period Weeks    Status New      PT LONG TERM GOAL #4   Title Pt will improve FOTO score to target score to indicate clinically significant improvement with functional ability.    Baseline 6/16: 56, target score of 67 7/11: 56, target is 67 7/27: 63/67    Time 8    Period Weeks    Status Partially Met                   Plan - 02/07/21 1725     Clinical Impression Statement Pt presents for f/u for R shoulder pain 2/2 to SAIS. She demonstrates improvement in R shoulder flexion and abduction, but continues to experience pain with shoulder IR. She will continue to benefit from skilled PT to progress R shoulder IR ROM in order to complete RUE tasks within pain free motion like lifting or manipulating objects at work.    Personal Factors and Comorbidities Time since onset of injury/illness/exacerbation;Comorbidity 1    Comorbidities Depression    Examination-Activity Limitations Bed Mobility;Dressing;Sleep;Lift    Examination-Participation Restrictions Occupation    Stability/Clinical Decision Making Stable/Uncomplicated    Rehab Potential Good    PT Frequency 2x / week    PT Duration 8 weeks    PT Next Visit Plan Shoulder IR mobilizations. Progress shoulder strengthening to include D2 flexion resisted ER at or above 90    PT [place] code [place] and Agree with Plan of Care Patient            HEP includes:   Access Code: [email or web address] URL: [email or web address] Date: 02/07/2021 Prepared by: Bradly Chris  Exercises Seated Upper Trapezius Stretch - 1 x daily - 7 x weekly - 1 sets - 5 reps - 30 hold Sleeper Stretch - 1 x daily - 7 x weekly - 1 sets - 5 reps -  30  hold Standing Shoulder Internal Rotation Stretch with Towel - 1 x daily - 7 x weekly - 1 sets - 5 reps - 30 hold Seated Shoulder Row with Anchored Resistance - 1 x daily - 3 x weekly - 3 sets - 10 reps Standing Lat Pull Down with Resistance - Elbows Bent - 1 x daily - 3 x weekly - 2 sets - 10 reps Standing Shoulder Flexion AAROM with Swiss Ball - 1 x daily - 7 x weekly - 2 sets - 10 reps - 2 hold Standing Shoulder Abduction AAROM with Swiss Ball on Table - 1 x daily - 7 x weekly - 2 sets - 10 reps - 2 hold Standing Shoulder External Rotation with Resistance at 45 Degrees of Abduction - 1 x daily - 3 x weekly - 2 sets - 10 reps Standing Shoulder Horizontal Abduction with Resistance - 1 x daily - 3 x weekly - 2 sets - 10 reps Standing Shoulder Flexion with Resistance - 1 x daily - 3 x weekly - 2 sets - 10 reps Wall Push Up with Plus - 1 x daily - 3 x weekly - 2 sets - 10 reps    Patient will benefit from skilled therapeutic intervention in order to improve the following deficits and impairments:  Decreased range of motion, Decreased strength, Pain  Visit Diagnosis: Chronic right shoulder pain  Tendinopathy of right rotator cuff  Subacromial impingement of right shoulder     Problem List Patient Active Problem List   Diagnosis Date Noted   Moderate episode of recurrent major depressive disorder ([place]) 10/29/2020   Right arm pain 10/03/2020   History of COVID-19 07/28/2020   Chest pain of uncertain etiology 53/74/8270   Dyspnea on exertion 10/22/2019   Palpitations 10/22/2019   PAC (premature atrial contraction) 10/22/2019   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   SVT (supraventricular tachycardia) (HCC) 08/15/2018   Thyroid nodule 10/28/2016   Ovarian cyst, right 09/30/2015   Endometriosis 08/10/2015   Status post vaginal hysterectomy 08/10/2015   Anxiety 08/10/2015   Obesity (BMI 30.0-34.9) 08/10/2015   Vitamin D deficiency 05/26/2015   Weight gain 02/22/2015    Menopause 09/02/2014   Visit for preventive health examination 08/24/2013   Snoring 08/24/2013   Symptoms, such as flushing, sleeplessness, headache, lack of concentration, associated with the menopause 08/24/2013   Encounter for drug screening 08/13/2013   Migraine headache 08/26/2012   Panic disorder without agoraphobia with mild panic attacks 08/26/2012   Bronchitis, chronic ([place]) 05/09/2012   History of diverticulitis of colon    GERD (gastroesophageal reflux disease)    Hyperlipidemia    Irritable bowel syndrome    Essential hypertension    Herniated intervertebral disc    Tobacco use 03/04/2012   Bradly Chris PT, DPT  02/08/2021, 3:37 PM  [facility] [address] [place], [address] Phone: [phone]   Fax:  [phone]  Name: MANSI TOKAR MRN: [id number] Date of Birth: December 19, 1960

## 2021-02-11 ENCOUNTER — Other Ambulatory Visit: Payer: Self-pay | Admitting: Internal Medicine

## 2021-02-12 ENCOUNTER — Encounter: Payer: Self-pay | Admitting: Dermatology

## 2021-02-12 ENCOUNTER — Ambulatory Visit: Payer: BC Managed Care – PPO | Attending: Internal Medicine | Admitting: Physical Therapy

## 2021-02-12 ENCOUNTER — Other Ambulatory Visit: Payer: Self-pay

## 2021-02-12 DIAGNOSIS — M25511 Pain in right shoulder: Secondary | ICD-10-CM | POA: Diagnosis not present

## 2021-02-12 DIAGNOSIS — L409 Psoriasis, unspecified: Secondary | ICD-10-CM

## 2021-02-12 DIAGNOSIS — M7541 Impingement syndrome of right shoulder: Secondary | ICD-10-CM | POA: Insufficient documentation

## 2021-02-12 DIAGNOSIS — G8929 Other chronic pain: Secondary | ICD-10-CM

## 2021-02-12 DIAGNOSIS — M67911 Unspecified disorder of synovium and tendon, right shoulder: Secondary | ICD-10-CM | POA: Insufficient documentation

## 2021-02-12 NOTE — Telephone Encounter (Signed)
OKay for patient to start [place]?

## 2021-02-12 NOTE — Progress Notes (Signed)
Error./js

## 2021-02-12 NOTE — Therapy (Signed)
[facility] [address], [place], [address] Phone: [phone]   Fax:  [phone]  Physical Therapy Treatment  Patient Details  Name: Colleen Washington MRN: [id number] Date of Birth: [birth year] Referring Provider (PT): tullo,teresa   Encounter Date: 02/12/2021    Past Medical History:  Diagnosis Date   allergic rhinitis    Anxiety    AR (allergic rhinitis)    Asthma    Depression    Endometriosis    GERD (gastroesophageal reflux disease)    H/O migraine    Herniated intervertebral disc    History of diverticulitis of colon    Hyperlipidemia    Hypertension    IBS (irritable bowel syndrome)    Irritable bowel syndrome    Nonobstructive CAD (coronary artery disease)    a. 11/2019 Cor CTA: Cor Ca2+ = 141 (95th %'ile). LAD 25-49p.   Psoriasis    Tobacco abuse     Past Surgical History:  Procedure Laterality Date   ABDOMINAL HYSTERECTOMY  2003   tvhuso   BREAST BIOPSY Left 08/2014   benign-BENIGN BREAST TISSUE WITH FIBROSIS   DILATION AND CURETTAGE OF UTERUS      There were no vitals filed for this visit.   Subjective Assessment - 02/12/21 1721     Subjective She reports feeling some pain from receiving a massage this weekend.    Pertinent History Reduction in rom and onset of pain coincides with lifting 4 weeks ago. Concern for shoulder impingement and/or rotator cuff injury. We will trial aggressive icing regimen, voltaren gel and if no improvement consult orthopedics. Pain closer to elbow itself raises suspicion for with overuse syndrome , question lateral epicondylitis. Pending XRays. Follow up in 6 weeks (10/03/20)    Limitations Other (comment)   Laying down on shoulder or picking up heavy object   How long can you sit comfortably? No problems sitting    How long can you stand comfortably? No problems standing    How long can you walk comfortably? No problems walking    Diagnostic tests X-ray no  remarkable results    Patient Stated Goals Wants to avoid shoulder surgery and place pressure on arm without feeling pain and liftt without feeling pain.    Currently in Pain? No/denies    Pain Score 0-No pain    Pain Location Shoulder    Pain Orientation Right    Pain Type Chronic pain    Pain Onset More than a month ago            MANUAL:   Anterior Posterior Glides in closed pack position 2 x 10 Grade III-IV  Posterior glide in flexion 2 x 10 Grade III-IV   THEREX:   Corner Pec Stretch 2 x 30 sec  Biceps Stretch 2 x 30 sec  D2 Flexion with YTB 1 x 10  D2 Flexion 1 X 10   Shoulder Abduction with #1 DB  2 x 10    Updated HEP and educated patient on changes to exercises and addition of new exercises        PT Short Term Goals - 02/12/21 1834       PT SHORT TERM GOAL #1   Title Pt will demonstrate understanding of HEP.    Time 2    Period Weeks    Status Achieved    Target Date 01/11/21               PT Long  Term Goals - 02/12/21 1834       PT LONG TERM GOAL #1   Title Pt will improve bilateral shoulder flexion AROM to >=140 degrees to be able to perform overhead activities for job and for self-grooming.    Baseline 6/16: 115 bilateral 7/11: R flex 130, L 140 7/27: R/L abduction 140/140    Time 8    Period Weeks    Status Achieved      PT LONG TERM GOAL #2   Title Pt will improve combined IR of right shoulder to be symmetrical with left shoulder in order to perform self grooming activities.    Baseline 7/11: L T12, R PSIS 7/27: L T12, R PSIS    Time 8    Period Weeks    Status Partially Met      PT LONG TERM GOAL #3   Title Pt will improve shoulder ER of right shoulder to be symmetrical with left shoulder to be able to perform overhead reaching activities for her job.    Baseline 6/16: Right shoulder ER 30 7/18: R ER 50 L IR 80 7/27: R/L 40/50    Time 8    Period Weeks    Status Partially Met      PT LONG TERM GOAL #4   Title Pt will improve  FOTO score to target score to indicate clinically significant improvement with functional ability.    Baseline 6/16: 56, target score of 67 7/11: 56, target is 67 7/27: 63/67    Time 8    Period Weeks    Status Partially Met            HEP includes:    Access Code: [email or web address] URL: [email or web address] Date: 02/12/2021 Prepared by: Bradly Chris  Exercises Seated Upper Trapezius Stretch - 1 x daily - 7 x weekly - 1 sets - 5 reps - 30 hold Seated Shoulder Row with Anchored Resistance - 1 x daily - 3 x weekly - 3 sets - 10 reps Standing Lat Pull Down with Resistance - Elbows Bent - 1 x daily - 3 x weekly - 2 sets - 10 reps Standing Shoulder Flexion AAROM with Swiss Ball - 1 x daily - 7 x weekly - 2 sets - 10 reps - 2 hold Standing Shoulder Abduction AAROM with Swiss Ball on Table - 1 x daily - 7 x weekly - 2 sets - 10 reps - 2 hold Standing Shoulder External Rotation with Resistance at 45 Degrees of Abduction - 1 x daily - 3 x weekly - 2 sets - 10 reps Standing Shoulder Horizontal Abduction with Resistance - 1 x daily - 3 x weekly - 2 sets - 10 reps Standing Shoulder Flexion with Resistance - 1 x daily - 3 x weekly - 2 sets - 10 reps Wall Push Up with Plus - 1 x daily - 3 x weekly - 2 sets - 10 reps Bicep Stretch at Table - 1 x daily - 3 x weekly - 1 sets - 5 reps - 30 hold Standing Shoulder Single Arm PNF D2 Flexion with Resistance - 1 x daily - 3 x weekly - 2 sets - 10 reps Shoulder Abduction with Dumbbells - Thumbs Up - 1 x daily - 3 x weekly - 2 sets - 10 reps       Plan - 02/12/21 1757     Clinical Impression Statement Pt presents for f/u for R shoulder pain 2/2 to SAIS. She continue to demonstrate pain with shoulder IR  and palpation of distal biceps. Pt exhibits improved periscapular strength with ability to complete D2 flexion and shoulder abduction. She will continue to benefit from skilled PT to progress R shoulder IR ROM and decrease R shoulder pain to  perform self-care tasks such as dressing herself and combing her hair.    Personal Factors and Comorbidities Time since onset of injury/illness/exacerbation;Comorbidity 1    Comorbidities Depression    Examination-Activity Limitations Bed Mobility;Dressing;Sleep;Lift    Examination-Participation Restrictions Occupation    Stability/Clinical Decision Making Stable/Uncomplicated    Rehab Potential Good    PT Frequency 2x / week    PT Duration 8 weeks    PT Next Visit Plan Shoulder AP IR mobilizations. Resisted Shoulder ER about 90 degrees, and chest press    PT Home Exercise Plan Medbridge code [place] and Agree with Plan of Care Patient             Patient will benefit from skilled therapeutic intervention in order to improve the following deficits and impairments:  Decreased range of motion, Decreased strength, Pain  Visit Diagnosis: Chronic right shoulder pain  Tendinopathy of right rotator cuff  Subacromial impingement of right shoulder     Problem List Patient Active Problem List   Diagnosis Date Noted   Moderate episode of recurrent major depressive disorder ([place]) 10/29/2020   Right arm pain 10/03/2020   History of COVID-19 07/28/2020   Chest pain of uncertain etiology 71/21/9758   Dyspnea on exertion 10/22/2019   Palpitations 10/22/2019   PAC (premature atrial contraction) 10/22/2019   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   SVT (supraventricular tachycardia) ([place]) 08/15/2018   Thyroid nodule 10/28/2016   Ovarian cyst, right 09/30/2015   Endometriosis 08/10/2015   Status post vaginal hysterectomy 08/10/2015   Anxiety 08/10/2015   Obesity (BMI 30.0-34.9) 08/10/2015   Vitamin D deficiency 05/26/2015   Weight gain 02/22/2015   Menopause 09/02/2014   Visit for preventive health examination 08/24/2013   Snoring 08/24/2013   Symptoms, such as flushing, sleeplessness, headache, lack of concentration, associated with the menopause 08/24/2013    Encounter for drug screening 08/13/2013   Migraine headache 08/26/2012   Panic disorder without agoraphobia with mild panic attacks 08/26/2012   Bronchitis, chronic ([place]) 05/09/2012   History of diverticulitis of colon    GERD (gastroesophageal reflux disease)    Hyperlipidemia    Irritable bowel syndrome    Essential hypertension    Herniated intervertebral disc    Tobacco use 03/04/2012   Bradly Chris PT, DPT  02/12/2021, 6:40 PM  [facility] [address], [place], [address] Phone: [phone]   Fax:  [phone]  Name: AFIA MESSENGER MRN: [id number] Date of Birth: 1960-11-19

## 2021-02-13 ENCOUNTER — Other Ambulatory Visit: Payer: Self-pay | Admitting: Nurse Practitioner

## 2021-02-14 ENCOUNTER — Ambulatory Visit: Payer: BC Managed Care – PPO | Admitting: Physical Therapy

## 2021-02-14 DIAGNOSIS — G8929 Other chronic pain: Secondary | ICD-10-CM | POA: Diagnosis not present

## 2021-02-14 DIAGNOSIS — M7541 Impingement syndrome of right shoulder: Secondary | ICD-10-CM

## 2021-02-14 DIAGNOSIS — M67911 Unspecified disorder of synovium and tendon, right shoulder: Secondary | ICD-10-CM

## 2021-02-14 DIAGNOSIS — M25511 Pain in right shoulder: Secondary | ICD-10-CM | POA: Diagnosis not present

## 2021-02-14 NOTE — Therapy (Signed)
[facility] [address], [place], [address] Phone: [phone]   Fax:  [phone]  Physical Therapy Treatment  Patient Details  Name: Colleen Washington MRN: [id number] Date of Birth: 1961/02/10 Referring Provider (PT): tullo,teresa   Encounter Date: 02/14/2021   PT End of Session - 02/14/21 1722     Visit Number 12    Number of Visits 17    Date for PT Re-Evaluation 02/22/21    PT Start Time 2878    PT Stop Time 1800    PT Time Calculation (min) 45 min    Activity Tolerance Patient tolerated treatment well;Patient limited by pain    Behavior During Therapy [facility] for tasks assessed/performed             Past Medical History:  Diagnosis Date   allergic rhinitis    Anxiety    AR (allergic rhinitis)    Asthma    Depression    Endometriosis    GERD (gastroesophageal reflux disease)    H/O migraine    Herniated intervertebral disc    History of diverticulitis of colon    Hyperlipidemia    Hypertension    IBS (irritable bowel syndrome)    Irritable bowel syndrome    Nonobstructive CAD (coronary artery disease)    a. 11/2019 Cor CTA: Cor Ca2+ = 141 (95th %'ile). LAD 25-49p.   Psoriasis    Tobacco abuse     Past Surgical History:  Procedure Laterality Date   ABDOMINAL HYSTERECTOMY  2003   tvhuso   BREAST BIOPSY Left 08/2014   benign-BENIGN BREAST TISSUE WITH FIBROSIS   DILATION AND CURETTAGE OF UTERUS      There were no vitals filed for this visit.   Subjective Assessment - 02/14/21 1716     Subjective Pt reports that she feels like her ROM is better but she is still experiencing pain when bringing her arm behind her back and reaching forward.    Pertinent History Reduction in rom and onset of pain coincides with lifting 4 weeks ago. Concern for shoulder impingement and/or rotator cuff injury. We will trial aggressive icing regimen, voltaren gel and if no improvement consult orthopedics. Pain  closer to elbow itself raises suspicion for with overuse syndrome , question lateral epicondylitis. Pending XRays. Follow up in 6 weeks (10/03/20)    Limitations Other (comment)   Laying down on shoulder or picking up heavy object   How long can you sit comfortably? No problems sitting    How long can you stand comfortably? No problems standing    How long can you walk comfortably? No problems walking    Diagnostic tests X-ray no remarkable results    Patient Stated Goals Wants to avoid shoulder surgery and place pressure on arm without feeling pain and liftt without feeling pain.    Pain Score 3     Pain Location Shoulder    Pain Orientation Right    Pain Type Chronic pain    Pain Onset More than a month ago            THEREX:  Wall Ys 2 x 7  -VC to maintain elbow extension   Seated Rows with Black TB 3 x 10   Seated T's without resistance 2 x 10  -VC to maintain elbow extension   Biceps Stretch to relieve biceps pain 2 x 30 sec    Updated HEP and educated patient on changes to exercises and addition  of new exercises and removal of old exercises.        PT Education - 02/14/21 1732     Education Details form/technique for exercise    Person(s) Educated Patient    Methods Explanation;Demonstration;Verbal cues    Comprehension Verbalized understanding;Verbal cues required;Returned demonstration              PT Short Term Goals - 02/14/21 1733       PT SHORT TERM GOAL #1   Title Pt will demonstrate understanding of HEP.    Time 2    Period Weeks    Status Achieved    Target Date 01/11/21               PT Long Term Goals - 02/14/21 1733       PT LONG TERM GOAL #1   Title Pt will improve bilateral shoulder flexion AROM to >=140 degrees to be able to perform overhead activities for job and for self-grooming.    Baseline 6/16: 115 bilateral 7/11: R flex 130, L 140 7/27: R/L abduction 140/140    Time 8    Period Weeks    Status Achieved      PT LONG  TERM GOAL #2   Title Pt will improve combined IR of right shoulder to be symmetrical with left shoulder in order to perform self grooming activities.    Baseline 7/11: L T12, R PSIS 7/27: L T12, R PSIS    Time 8    Period Weeks    Status Partially Met      PT LONG TERM GOAL #3   Title Pt will improve shoulder ER of right shoulder to be symmetrical with left shoulder to be able to perform overhead reaching activities for her job.    Baseline 6/16: Right shoulder ER 30 7/18: R ER 50 L IR 80 7/27: R/L 40/50    Time 8    Period Weeks    Status Partially Met      PT LONG TERM GOAL #4   Title Pt will improve FOTO score to target score to indicate clinically significant improvement with functional ability.    Baseline 6/16: 56, target score of 67 7/11: 56, target is 67 7/27: 63/67    Time 8    Period Weeks    Status Partially Met                   Plan - 02/14/21 1732     Clinical Impression Statement Pt presents for f/u for R shoulder pain 2/2 to SAIS and possible biceps tendinopathy. Pt exhibits increased periscapular strength with ability to complete scapular rows with increased resistance and ability to completed seated Ts without compensation. She does however continue to experience increased pain with R shoulder IR which is likely exacerbating her SAIS. She will continue to benefit from skilled PT to decrease her R shoulder pain and increase R shoulder IR to return to self-care activities.    Personal Factors and Comorbidities Time since onset of injury/illness/exacerbation;Comorbidity 1    Comorbidities Depression    Examination-Activity Limitations Bed Mobility;Dressing;Sleep;Lift    Examination-Participation Restrictions Occupation    Stability/Clinical Decision Making Stable/Uncomplicated    Rehab Potential Good    PT Frequency 2x / week    PT Duration 8 weeks    PT Next Visit Plan Progress resisted shoulder ER at 90 degrees and add chest press    PT [place] code [place]  and Agree with Plan of Care Patient            HEP includes following exercises:  Access Code: [email or web address] URL: [email or web address] Date: 02/14/2021 Prepared by: Bradly Chris  Exercises Seated Upper Trapezius Stretch - 1 x daily - 7 x weekly - 1 sets - 5 reps - 30 hold Bicep Stretch at Table - 1 x daily - 3 x weekly - 1 sets - 5 reps - 30 hold Seated Shoulder Row with Anchored Resistance - 1 x daily - 3 x weekly - 3 sets - 10 reps Standing Shoulder Horizontal Abduction with Resistance - 1 x daily - 3 x weekly - 2 sets - 10 reps Standing Lat Pull Down with Resistance - Elbows Bent - 1 x daily - 3 x weekly - 2 sets - 10 reps Standing Shoulder External Rotation with Resistance at 45 Degrees of Abduction - 1 x daily - 3 x weekly - 2 sets - 10 reps Standing Shoulder Flexion with Resistance - 1 x daily - 3 x weekly - 2 sets - 10 reps Wall Push Up with Plus - 1 x daily - 3 x weekly - 2 sets - 10 reps Shoulder Abduction with Dumbbells - Thumbs Up - 1 x daily - 3 x weekly - 2 sets - 10 reps Low Trap Setting at [place] - 1 x daily - 3 x weekly - 2 sets - 7 reps   Patient will benefit from skilled therapeutic intervention in order to improve the following deficits and impairments:  Decreased range of motion, Decreased strength, Pain  Visit Diagnosis: Chronic right shoulder pain  Tendinopathy of right rotator cuff  Subacromial impingement of right shoulder     Problem List Patient Active Problem List   Diagnosis Date Noted   Moderate episode of recurrent major depressive disorder ([place]) 10/29/2020   Right arm pain 10/03/2020   History of COVID-19 07/28/2020   Chest pain of uncertain etiology 27/78/2423   Dyspnea on exertion 10/22/2019   Palpitations 10/22/2019   PAC (premature atrial contraction) 10/22/2019   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   SVT (supraventricular tachycardia) ([place]) 08/15/2018   Thyroid nodule  10/28/2016   Ovarian cyst, right 09/30/2015   Endometriosis 08/10/2015   Status post vaginal hysterectomy 08/10/2015   Anxiety 08/10/2015   Obesity (BMI 30.0-34.9) 08/10/2015   Vitamin D deficiency 05/26/2015   Weight gain 02/22/2015   Menopause 09/02/2014   Visit for preventive health examination 08/24/2013   Snoring 08/24/2013   Symptoms, such as flushing, sleeplessness, headache, lack of concentration, associated with the menopause 08/24/2013   Encounter for drug screening 08/13/2013   Migraine headache 08/26/2012   Panic disorder without agoraphobia with mild panic attacks 08/26/2012   Bronchitis, chronic ([place]) 05/09/2012   History of diverticulitis of colon    GERD (gastroesophageal reflux disease)    Hyperlipidemia    Irritable bowel syndrome    Essential hypertension    Herniated intervertebral disc    Tobacco use 03/04/2012   Bradly Chris PT, DPT  02/14/2021, 5:53 PM  [facility] [address], [place], [address] Phone: [phone]   Fax:  [phone]  Name: Colleen Washington MRN: [id number] Date of Birth: 1961/07/05

## 2021-02-19 ENCOUNTER — Other Ambulatory Visit
Admission: RE | Admit: 2021-02-19 | Discharge: 2021-02-19 | Disposition: A | Discharge status: Home / Self Care | Payer: BC Managed Care – PPO | Attending: Dermatology | Admitting: Dermatology

## 2021-02-19 ENCOUNTER — Ambulatory Visit: Payer: BC Managed Care – PPO | Admitting: Physical Therapy

## 2021-02-19 DIAGNOSIS — M67911 Unspecified disorder of synovium and tendon, right shoulder: Secondary | ICD-10-CM | POA: Diagnosis not present

## 2021-02-19 DIAGNOSIS — M7541 Impingement syndrome of right shoulder: Secondary | ICD-10-CM

## 2021-02-19 DIAGNOSIS — L409 Psoriasis, unspecified: Secondary | ICD-10-CM | POA: Diagnosis not present

## 2021-02-19 DIAGNOSIS — M25511 Pain in right shoulder: Secondary | ICD-10-CM | POA: Diagnosis not present

## 2021-02-19 DIAGNOSIS — Z79899 Other long term (current) drug therapy: Secondary | ICD-10-CM | POA: Insufficient documentation

## 2021-02-19 DIAGNOSIS — G8929 Other chronic pain: Secondary | ICD-10-CM

## 2021-02-19 NOTE — Therapy (Signed)
[facility] [address], [place], [address] Phone: [phone]   Fax:  [phone]  Physical Therapy Treatment  Patient Details  Name: Colleen Washington MRN: [id number] Date of Birth: 12-30-1960 Referring Provider (PT): tullo,teresa   Encounter Date: 02/19/2021   PT End of Session - 02/19/21 1756     Visit Number 13    Number of Visits 17    Date for PT Re-Evaluation 02/22/21    PT Start Time 6270    PT Stop Time 1800    PT Time Calculation (min) 45 min    Activity Tolerance Patient tolerated treatment well;Patient limited by pain    Behavior During Therapy [facility] for tasks assessed/performed             Past Medical History:  Diagnosis Date   allergic rhinitis    Anxiety    AR (allergic rhinitis)    Asthma    Depression    Endometriosis    GERD (gastroesophageal reflux disease)    H/O migraine    Herniated intervertebral disc    History of diverticulitis of colon    Hyperlipidemia    Hypertension    IBS (irritable bowel syndrome)    Irritable bowel syndrome    Nonobstructive CAD (coronary artery disease)    a. 11/2019 Cor CTA: Cor Ca2+ = 141 (95th %'ile). LAD 25-49p.   Psoriasis    Tobacco abuse     Past Surgical History:  Procedure Laterality Date   ABDOMINAL HYSTERECTOMY  2003   tvhuso   BREAST BIOPSY Left 08/2014   benign-BENIGN BREAST TISSUE WITH FIBROSIS   DILATION AND CURETTAGE OF UTERUS      There were no vitals filed for this visit.   Subjective Assessment - 02/19/21 1719     Subjective Pt reports being able to pick up object for work and she did not feel any pain. She has been able to do exercises without any difficulty.    Pertinent History Reduction in rom and onset of pain coincides with lifting 4 weeks ago. Concern for shoulder impingement and/or rotator cuff injury. We will trial aggressive icing regimen, voltaren gel and if no improvement consult orthopedics. Pain closer to  elbow itself raises suspicion for with overuse syndrome , question lateral epicondylitis. Pending XRays. Follow up in 6 weeks (10/03/20)    Limitations Other (comment)   Laying down on shoulder or picking up heavy object   How long can you sit comfortably? No problems sitting    How long can you stand comfortably? No problems standing    How long can you walk comfortably? No problems walking    Diagnostic tests X-ray no remarkable results    Patient Stated Goals Wants to avoid shoulder surgery and place pressure on arm without feeling pain and liftt without feeling pain.    Currently in Pain? No/denies    Pain Onset More than a month ago            THEREX:  Lower Trap Wall Banded Wall V's w/ YTB 2 x 10  Push Up with Plus 2 x 10 -VC to push until shoulders are arching out  Omega Lat Pull Down #20 2 x 10  Omega Seated Rows #20 2 x 10  Omega Chest Press #20 2 x 10   Chest Press with Red TB 2 x 10    Updated HEP and educated patient on changes to exercises and addition of new exercises  PT Short Term Goals - 02/14/21 1733       PT SHORT TERM GOAL #1   Title Pt will demonstrate understanding of HEP.    Time 2    Period Weeks    Status Achieved    Target Date 01/11/21               PT Long Term Goals - 02/14/21 1733       PT LONG TERM GOAL #1   Title Pt will improve bilateral shoulder flexion AROM to >=140 degrees to be able to perform overhead activities for job and for self-grooming.    Baseline 6/16: 115 bilateral 7/11: R flex 130, L 140 7/27: R/L abduction 140/140    Time 8    Period Weeks    Status Achieved      PT LONG TERM GOAL #2   Title Pt will improve combined IR of right shoulder to be symmetrical with left shoulder in order to perform self grooming activities.    Baseline 7/11: L T12, R PSIS 7/27: L T12, R PSIS    Time 8    Period Weeks    Status Partially Met      PT LONG TERM GOAL #3   Title Pt will improve shoulder ER of right shoulder to  be symmetrical with left shoulder to be able to perform overhead reaching activities for her job.    Baseline 6/16: Right shoulder ER 30 7/18: R ER 50 L IR 80 7/27: R/L 40/50    Time 8    Period Weeks    Status Partially Met      PT LONG TERM GOAL #4   Title Pt will improve FOTO score to target score to indicate clinically significant improvement with functional ability.    Baseline 6/16: 56, target score of 67 7/11: 56, target is 67 7/27: 63/67    Time 8    Period Weeks    Status Partially Met            HEP includes following exercises:   Access Code: [email or web address] URL: [email or web address] Date: 02/19/2021 Prepared by: Bradly Chris  Exercises Seated Upper Trapezius Stretch - 1 x daily - 7 x weekly - 1 sets - 5 reps - 30 hold Bicep Stretch at Table - 1 x daily - 3 x weekly - 1 sets - 5 reps - 30 hold Seated Shoulder Row with Anchored Resistance - 1 x daily - 3 x weekly - 3 sets - 10 reps Standing Shoulder Horizontal Abduction with Resistance - 1 x daily - 3 x weekly - 2 sets - 10 reps Standing Lat Pull Down with Resistance - Elbows Bent - 1 x daily - 3 x weekly - 2 sets - 10 reps Standing Shoulder External Rotation with Resistance at 45 Degrees of Abduction - 1 x daily - 3 x weekly - 2 sets - 10 reps Standing Shoulder Flexion with Resistance - 1 x daily - 3 x weekly - 2 sets - 10 reps Wall Push Up with Plus - 1 x daily - 3 x weekly - 2 sets - 10 reps Shoulder Abduction with Dumbbells - Thumbs Up - 1 x daily - 3 x weekly - 2 sets - 10 reps Standing Low Trap Setting with Resistance at Wall - 1 x daily - 3 x weekly - 2 sets - 10 reps Chest Press with Resistance - 1 x daily - 3 x weekly - 2 sets - 10 reps  Plan - 02/19/21 1721     Clinical Impression Statement Pt presents for f/u for R shoulder pain 2/2 to SAIS and possible biceps tendinopathy. Pt demonstrates increased shoulder and periscapular strenght with ability to complete exercises with increased  resistance using omega machine and without experiencing increased pain. She will continue to benefit from skilled PT to increase her R shoulder ROM and strength to complete lifting jobs for her job and overhead self-care tasks such as combing her hair.    Personal Factors and Comorbidities Time since onset of injury/illness/exacerbation;Comorbidity 1    Comorbidities Depression    Examination-Activity Limitations Bed Mobility;Dressing;Sleep;Lift    Examination-Participation Restrictions Occupation    Stability/Clinical Decision Making Stable/Uncomplicated    Rehab Potential Good    PT Frequency 2x / week    PT Duration 8 weeks    PT Next Visit Plan Progress shoulder ER exercise and shoulder 4 ways    PT Home Exercise Plan Med[employer] code [place] and Agree with Plan of Care Patient             Patient will benefit from skilled therapeutic intervention in order to improve the following deficits and impairments:  Decreased range of motion, Decreased strength, Pain  Visit Diagnosis: Chronic right shoulder pain  Tendinopathy of right rotator cuff  Subacromial impingement of right shoulder     Problem List Patient Active Problem List   Diagnosis Date Noted   Moderate episode of recurrent major depressive disorder ([place]) 10/29/2020   Right arm pain 10/03/2020   History of COVID-19 07/28/2020   Chest pain of uncertain etiology 50/56/7889   Dyspnea on exertion 10/22/2019   Palpitations 10/22/2019   PAC (premature atrial contraction) 10/22/2019   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   SVT (supraventricular tachycardia) ([place]) 08/15/2018   Thyroid nodule 10/28/2016   Ovarian cyst, right 09/30/2015   Endometriosis 08/10/2015   Status post vaginal hysterectomy 08/10/2015   Anxiety 08/10/2015   Obesity (BMI 30.0-34.9) 08/10/2015   Vitamin D deficiency 05/26/2015   Weight gain 02/22/2015   Menopause 09/02/2014   Visit for preventive health examination 08/24/2013    Snoring 08/24/2013   Symptoms, such as flushing, sleeplessness, headache, lack of concentration, associated with the menopause 08/24/2013   Encounter for drug screening 08/13/2013   Migraine headache 08/26/2012   Panic disorder without agoraphobia with mild panic attacks 08/26/2012   Bronchitis, chronic ([place]) 05/09/2012   History of diverticulitis of colon    GERD (gastroesophageal reflux disease)    Hyperlipidemia    Irritable bowel syndrome    Essential hypertension    Herniated intervertebral disc    Tobacco use 03/04/2012   Bradly Chris PT, DPT  02/19/2021, 6:01 PM  [facility] [address], [place], [address] Phone: [phone]   Fax:  [phone]  Name: Colleen Washington MRN: [id number] Date of Birth: 1961-07-01

## 2021-02-20 ENCOUNTER — Other Ambulatory Visit: Payer: Self-pay | Admitting: Internal Medicine

## 2021-02-21 ENCOUNTER — Ambulatory Visit: Payer: BC Managed Care – PPO | Admitting: Physical Therapy

## 2021-02-21 DIAGNOSIS — M7541 Impingement syndrome of right shoulder: Secondary | ICD-10-CM | POA: Diagnosis not present

## 2021-02-21 DIAGNOSIS — M25511 Pain in right shoulder: Secondary | ICD-10-CM | POA: Diagnosis not present

## 2021-02-21 DIAGNOSIS — M67911 Unspecified disorder of synovium and tendon, right shoulder: Secondary | ICD-10-CM | POA: Diagnosis not present

## 2021-02-21 DIAGNOSIS — G8929 Other chronic pain: Secondary | ICD-10-CM

## 2021-02-21 NOTE — Addendum Note (Signed)
Addended by: Daneil Dan on: 02/21/2021 06:07 PM   Modules accepted: Orders

## 2021-02-21 NOTE — Therapy (Addendum)
[facility] [address], [place], [address] Phone: [phone]   Fax:  [phone]  Physical Therapy Treatment/ Re-certification  Patient Details  Name: Colleen Washington MRN: [id number] Date of Birth: 12-Apr-1961 Referring Provider (PT): tullo,teresa   Encounter Date: 02/21/2021    Past Medical History:  Diagnosis Date   allergic rhinitis    Anxiety    AR (allergic rhinitis)    Asthma    Depression    Endometriosis    GERD (gastroesophageal reflux disease)    H/O migraine    Herniated intervertebral disc    History of diverticulitis of colon    Hyperlipidemia    Hypertension    IBS (irritable bowel syndrome)    Irritable bowel syndrome    Nonobstructive CAD (coronary artery disease)    a. 11/2019 Cor CTA: Cor Ca2+ = 141 (95th %'ile). LAD 25-49p.   Psoriasis    Tobacco abuse     Past Surgical History:  Procedure Laterality Date   ABDOMINAL HYSTERECTOMY  2003   tvhuso   BREAST BIOPSY Left 08/2014   benign-BENIGN BREAST TISSUE WITH FIBROSIS   DILATION AND CURETTAGE OF UTERUS      There were no vitals filed for this visit.   Subjective Assessment - 02/21/21 1715     Subjective Pt reports that she feels a little sore from exercises, but she notices in here ability to use shoulder.    Pertinent History Reduction in rom and onset of pain coincides with lifting 4 weeks ago. Concern for shoulder impingement and/or rotator cuff injury. We will trial aggressive icing regimen, voltaren gel and if no improvement consult orthopedics. Pain closer to elbow itself raises suspicion for with overuse syndrome , question lateral epicondylitis. Pending XRays. Follow up in 6 weeks (10/03/20)    Limitations Other (comment)   Laying down on shoulder or picking up heavy object   How long can you sit comfortably? No problems sitting    How long can you stand comfortably? No problems standing    How long can you walk  comfortably? No problems walking    Diagnostic tests X-ray no remarkable results    Patient Stated Goals Wants to avoid shoulder surgery and place pressure on arm without feeling pain and liftt without feeling pain.    Currently in Pain? No/denies    Pain Onset More than a month ago             THEREX:  Face Pulls #5 3 x 10  -VC to pull weight towards eyes   Lat Pulldown #20 3 x 10  -VC to bring bar down to chest    Seated Rows #25 3 x 10  -TC to for shoulder squeeze   Standing Shoulder Front Raise to T's 2 x 10  -VC to maintain elbow extension   Standing Shoulder Abduction #2 DB 2 x 10   Standing Should Abduction Red TB 1 x 10    Push Up with Plus 2 x 10    Updated HEP and educated patient on changes to exercises and addition of new exercises        PT Short Term Goals - 02/21/21 1759       PT SHORT TERM GOAL #1   Title Pt will demonstrate understanding of HEP.    Time 2    Period Weeks    Status Achieved    Target Date 01/11/21  PT Long Term Goals - 02/21/21 1759       PT LONG TERM GOAL #1   Title Pt will improve bilateral shoulder flexion AROM to >=140 degrees to be able to perform overhead activities for job and for self-grooming.    Baseline 6/16: 115 bilateral 7/11: R flex 130, L 140 7/27: R/L abduction 140/140    Time 8    Period Weeks    Status Achieved      PT LONG TERM GOAL #2   Title Pt will improve combined IR of right shoulder to be symmetrical with left shoulder in order to perform self grooming activities.    Baseline 7/11: L T12, R PSIS 7/27: L T12, R PSIS    Time 8    Period Weeks    Status Partially Met      PT LONG TERM GOAL #3   Title Pt will improve shoulder ER of right shoulder to be symmetrical with left shoulder to be able to perform overhead reaching activities for her job.    Baseline 6/16: Right shoulder ER 30 7/18: R ER 50 L IR 80 7/27: R/L 40/50    Time 8    Period Weeks    Status Partially Met       PT LONG TERM GOAL #4   Title Pt will improve FOTO score to target score to indicate clinically significant improvement with functional ability.    Baseline 6/16: 56, target score of 67 7/11: 56, target is 67 7/27: 63/67    Time 8    Period Weeks    Status Partially Met            HEP includes:  Access Code: [email or web address] URL: [email or web address] Date: 02/21/2021 Prepared by:    Exercises Seated Upper Trapezius Stretch - 1 x daily - 7 x weekly - 1 sets - 5 reps - 30 hold Bicep Stretch at Table - 1 x daily - 3 x weekly - 1 sets - 5 reps - 30 hold Seated Shoulder Row with Anchored Resistance - 1 x daily - 3 x weekly - 3 sets - 10 reps Standing Shoulder Horizontal Abduction with Resistance - 1 x daily - 3 x weekly - 2 sets - 10 reps Standing Lat Pull Down with Resistance - Elbows Bent - 1 x daily - 3 x weekly - 2 sets - 10 reps Standing Shoulder External Rotation with Resistance at 45 Degrees of Abduction - 1 x daily - 3 x weekly - 2 sets - 10 reps Standing Shoulder Flexion with Resistance - 1 x daily - 3 x weekly - 2 sets - 10 reps Wall Push Up with Plus - 1 x daily - 3 x weekly - 2 sets - 10 reps Standing Low Trap Setting with Resistance at Wall - 1 x daily - 3 x weekly - 2 sets - 10 reps Chest Press with Resistance - 1 x daily - 3 x weekly - 2 sets - 10 reps Standing Single Arm Shoulder Abduction with Resistance - 1 x daily - 3 x weekly - 2 sets - 10 reps        Plan - 02/21/21 1751     Clinical Impression Statement Pt presents for f/u for R shoulder pain 2/2 to SAIS and possible biceps tendinopathy. She continues to exhibit increased shoulder strength with ability to perform shoulder exercises at an increased resistance. Pt still exhibits limitations with ER above 90 degrees abduction on RUE. She will continue to benefit from skilled   PT to increase shoulder ROM and strength in order to perform job related and personal overhead tasks.    Personal  Factors and Comorbidities Time since onset of injury/illness/exacerbation;Comorbidity 1    Comorbidities Depression    Examination-Activity Limitations Bed Mobility;Dressing;Sleep;Lift    Examination-Participation Restrictions Occupation    Stability/Clinical Decision Making Stable/Uncomplicated    Rehab Potential Good    PT Frequency 2x / week    PT Duration 8 weeks    PT Next Visit Plan Shoulder 4 wyas. Progress shoulder ER exercise.    PT Home Exercise Plan Medbridge code [email or web address]    Consulted and Agree with Plan of Care Patient             Patient will benefit from skilled therapeutic intervention in order to improve the following deficits and impairments:  Decreased range of motion, Decreased strength, Pain  Visit Diagnosis: Chronic right shoulder pain  Tendinopathy of right rotator cuff  Subacromial impingement of right shoulder     Problem List Patient Active Problem List   Diagnosis Date Noted   Moderate episode of recurrent major depressive disorder (HCC) 10/29/2020   Right arm pain 10/03/2020   History of COVID-19 07/28/2020   Chest pain of uncertain etiology 10/22/2019   Dyspnea on exertion 10/22/2019   Palpitations 10/22/2019   PAC (premature atrial contraction) 10/22/2019   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   SVT (supraventricular tachycardia) (HCC) 08/15/2018   Thyroid nodule 10/28/2016   Ovarian cyst, right 09/30/2015   Endometriosis 08/10/2015   Status post vaginal hysterectomy 08/10/2015   Anxiety 08/10/2015   Obesity (BMI 30.0-34.9) 08/10/2015   Vitamin D deficiency 05/26/2015   Weight gain 02/22/2015   Menopause 09/02/2014   Visit for preventive health examination 08/24/2013   Snoring 08/24/2013   Symptoms, such as flushing, sleeplessness, headache, lack of concentration, associated with the menopause 08/24/2013   Encounter for drug screening 08/13/2013   Migraine headache 08/26/2012   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 [place], [place], [address] Phone: [phone]   Fax:  [phone]  Name: Colleen Washington MRN: [id number] Date of Birth: 11/14/1960    

## 2021-02-25 LAB — QUANTIFERON-TB GOLD PLUS (RQFGPL)
QuantiFERON Mitogen Value: >10 IU/mL
QuantiFERON Nil Value: 0.04 IU/mL
QuantiFERON TB1 Ag Value: 0.15 IU/mL
QuantiFERON TB2 Ag Value: 0.1 IU/mL

## 2021-02-25 LAB — QUANTIFERON-TB GOLD PLUS: QuantiFERON-TB Gold Plus: NEGATIVE

## 2021-02-26 ENCOUNTER — Other Ambulatory Visit: Payer: Self-pay | Admitting: Nurse Practitioner

## 2021-02-27 ENCOUNTER — Ambulatory Visit: Payer: BC Managed Care – PPO | Admitting: Dermatology

## 2021-02-28 ENCOUNTER — Ambulatory Visit: Payer: BC Managed Care – PPO | Admitting: Physical Therapy

## 2021-02-28 DIAGNOSIS — M25511 Pain in right shoulder: Secondary | ICD-10-CM

## 2021-02-28 DIAGNOSIS — G8929 Other chronic pain: Secondary | ICD-10-CM | POA: Diagnosis not present

## 2021-02-28 DIAGNOSIS — M7541 Impingement syndrome of right shoulder: Secondary | ICD-10-CM

## 2021-02-28 DIAGNOSIS — M67911 Unspecified disorder of synovium and tendon, right shoulder: Secondary | ICD-10-CM | POA: Diagnosis not present

## 2021-02-28 NOTE — Therapy (Signed)
[facility] [address], [place], [address] Phone: [phone]   Fax:  [phone]  Physical Therapy Treatment  Patient Details  Name: Colleen Washington MRN: [id number] Date of Birth: 08-14-1960 Referring Provider (PT): tullo,teresa   Encounter Date: 02/28/2021   PT End of Session - 02/28/21 1637     Visit Number 15    Number of Visits 17    Date for PT Re-Evaluation 02/22/21    PT Start Time 30    PT Stop Time 1710    PT Time Calculation (min) 40 min    Activity Tolerance Patient tolerated treatment well;Patient limited by pain    Behavior During Therapy [facility] for tasks assessed/performed             Past Medical History:  Diagnosis Date   allergic rhinitis    Anxiety    AR (allergic rhinitis)    Asthma    Depression    Endometriosis    GERD (gastroesophageal reflux disease)    H/O migraine    Herniated intervertebral disc    History of diverticulitis of colon    Hyperlipidemia    Hypertension    IBS (irritable bowel syndrome)    Irritable bowel syndrome    Nonobstructive CAD (coronary artery disease)    a. 11/2019 Cor CTA: Cor Ca2+ = 141 (95th %'ile). LAD 25-49p.   Psoriasis    Tobacco abuse     Past Surgical History:  Procedure Laterality Date   ABDOMINAL HYSTERECTOMY  2003   tvhuso   BREAST BIOPSY Left 08/2014   benign-BENIGN BREAST TISSUE WITH FIBROSIS   DILATION AND CURETTAGE OF UTERUS      There were no vitals filed for this visit.   Subjective Assessment - 02/28/21 1632     Subjective Pt reports her right shoulder feels tighter and that her massage therapist recommended accupunture. She felt a muscle spasm in her right arm after trying to pick up an object with both hands.    Pertinent History Reduction in rom and onset of pain coincides with lifting 4 weeks ago. Concern for shoulder impingement and/or rotator cuff injury. We will trial aggressive icing regimen, voltaren gel  and if no improvement consult orthopedics. Pain closer to elbow itself raises suspicion for with overuse syndrome , question lateral epicondylitis. Pending XRays. Follow up in 6 weeks (10/03/20)    Limitations Other (comment)   Laying down on shoulder or picking up heavy object   How long can you sit comfortably? No problems sitting    How long can you stand comfortably? No problems standing    How long can you walk comfortably? No problems walking    Diagnostic tests X-ray no remarkable results    Patient Stated Goals Wants to avoid shoulder surgery and place pressure on arm without feeling pain and liftt without feeling pain.    Currently in Pain? No/denies    Pain Onset More than a month ago             THEREX:  Upper Trap Stretch 2 x 30 sec  Biceps Stretch with RUE 30 sec  OMEGA Face Pulls with 5 lbs 3 x 10  OMEGA Seated Rows with 25 lbs 3 x 10  OMEGA Lat Pulldowns with 25 lbs 3 x 10   9.5 lb Box Lifts onto Table with Platform measuring 49.5 inches 1 x 10   16 cone placement to mimic stocking shelves  at various levels  Banded Shoulder Reaches Yellow Theraband 2 x 10    Updated HEP and educated patient on changes to exercises and addition of new exercises          PT Short Term Goals - 02/21/21 1759       PT SHORT TERM GOAL #1   Title Pt will demonstrate understanding of HEP.    Time 2    Period Weeks    Status Achieved    Target Date 01/11/21               PT Long Term Goals - 02/21/21 1759       PT LONG TERM GOAL #1   Title Pt will improve bilateral shoulder flexion AROM to >=140 degrees to be able to perform overhead activities for job and for self-grooming.    Baseline 6/16: 115 bilateral 7/11: R flex 130, L 140 7/27: R/L abduction 140/140    Time 8    Period Weeks    Status Achieved      PT LONG TERM GOAL #2   Title Pt will improve combined IR of right shoulder to be symmetrical with left shoulder in order to perform self grooming  activities.    Baseline 7/11: L T12, R PSIS 7/27: L T12, R PSIS    Time 8    Period Weeks    Status Partially Met      PT LONG TERM GOAL #3   Title Pt will improve shoulder ER of right shoulder to be symmetrical with left shoulder to be able to perform overhead reaching activities for her job.    Baseline 6/16: Right shoulder ER 30 7/18: R ER 50 L IR 80 7/27: R/L 40/50    Time 8    Period Weeks    Status Partially Met      PT LONG TERM GOAL #4   Title Pt will improve FOTO score to target score to indicate clinically significant improvement with functional ability.    Baseline 6/16: 56, target score of 67 7/11: 56, target is 67 7/27: 63/67    Time 8    Period Weeks    Status Partially Met                   Plan - 02/28/21 1638     Personal Factors and Comorbidities Time since onset of injury/illness/exacerbation;Comorbidity 1    Comorbidities Depression    Examination-Activity Limitations Bed Mobility;Dressing;Sleep;Lift    Examination-Participation Restrictions Occupation    Stability/Clinical Decision Making Stable/Uncomplicated    Rehab Potential Good    PT Frequency 2x / week    PT Duration 8 weeks    PT Next Visit Plan Shoulder 4 wyas. Progress shoulder ER exercise.    PT [place] code [email or web address]    Consulted and Agree with Plan of Care Patient            HEP includes following:  Access Code: [email or web address] URL: [email or web address] Date: 03/01/2021 Prepared by: Bradly Chris  Exercises Seated Upper Trapezius Stretch - 1 x daily - 7 x weekly - 1 sets - 5 reps - 30 hold Bicep Stretch at Table - 1 x daily - 3 x weekly - 1 sets - 5 reps - 30 hold Seated Shoulder Row with Anchored Resistance - 1 x daily - 3 x weekly - 3 sets - 10 reps Standing Shoulder Horizontal Abduction with Resistance - 1 x daily - 3 x weekly - 2 sets - 10 reps Standing Lat  Pull Down with Resistance - Elbows Bent - 1 x daily - 3 x weekly - 2 sets - 10  reps Standing Shoulder External Rotation with Resistance at 45 Degrees of Abduction - 1 x daily - 3 x weekly - 2 sets - 10 reps Standing Shoulder Flexion with Resistance - 1 x daily - 3 x weekly - 2 sets - 10 reps Wall Push Up with Plus - 1 x daily - 3 x weekly - 2 sets - 10 reps Standing Low Trap Setting with Resistance at [place] - 1 x daily - 3 x weekly - 2 sets - 10 reps Chest Press with Resistance - 1 x daily - 3 x weekly - 2 sets - 10 reps Standing Single Arm Shoulder Abduction with Resistance - 1 x daily - 3 x weekly - 2 sets - 10 reps    Patient will benefit from skilled therapeutic intervention in order to improve the following deficits and impairments:  Decreased range of motion, Decreased strength, Pain  Visit Diagnosis: No diagnosis found.     Problem List Patient Active Problem List   Diagnosis Date Noted   Moderate episode of recurrent major depressive disorder ([place]) 10/29/2020   Right arm pain 10/03/2020   History of COVID-19 07/28/2020   Chest pain of uncertain etiology 84/46/5207   Dyspnea on exertion 10/22/2019   Palpitations 10/22/2019   PAC (premature atrial contraction) 10/22/2019   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   SVT (supraventricular tachycardia) ([place]) 08/15/2018   Thyroid nodule 10/28/2016   Ovarian cyst, right 09/30/2015   Endometriosis 08/10/2015   Status post vaginal hysterectomy 08/10/2015   Anxiety 08/10/2015   Obesity (BMI 30.0-34.9) 08/10/2015   Vitamin D deficiency 05/26/2015   Weight gain 02/22/2015   Menopause 09/02/2014   Visit for preventive health examination 08/24/2013   Snoring 08/24/2013   Symptoms, such as flushing, sleeplessness, headache, lack of concentration, associated with the menopause 08/24/2013   Encounter for drug screening 08/13/2013   Migraine headache 08/26/2012   Panic disorder without agoraphobia with mild panic attacks 08/26/2012   Bronchitis, chronic ([place]) 05/09/2012   History of diverticulitis of colon     GERD (gastroesophageal reflux disease)    Hyperlipidemia    Irritable bowel syndrome    Essential hypertension    Herniated intervertebral disc    Tobacco use 03/04/2012   Bradly Chris PT, DPT  02/28/2021, 4:39 PM  [facility] [address] [place], [address] Phone: [phone]   Fax:  [phone]  Name: Colleen Washington MRN: [id number] Date of Birth: [birth year]

## 2021-03-06 ENCOUNTER — Ambulatory Visit: Payer: BC Managed Care – PPO | Admitting: Physical Therapy

## 2021-03-06 DIAGNOSIS — M7541 Impingement syndrome of right shoulder: Secondary | ICD-10-CM

## 2021-03-06 DIAGNOSIS — M25511 Pain in right shoulder: Secondary | ICD-10-CM | POA: Diagnosis not present

## 2021-03-06 DIAGNOSIS — G8929 Other chronic pain: Secondary | ICD-10-CM

## 2021-03-06 DIAGNOSIS — M67911 Unspecified disorder of synovium and tendon, right shoulder: Secondary | ICD-10-CM | POA: Diagnosis not present

## 2021-03-06 NOTE — Therapy (Signed)
[facility] [address], [place], [address] Phone: [phone]   Fax:  [phone]  Physical Therapy Treatment  Patient Details  Name: Colleen Washington MRN: [id number] Date of Birth: 1960-12-29 Referring Provider (PT): tullo,teresa   Encounter Date: 03/06/2021   PT End of Session - 03/06/21 2831     Visit Number 16    Number of Visits 17    Date for PT Re-Evaluation 02/22/21    PT Start Time 5176    PT Stop Time 1800    PT Time Calculation (min) 45 min    Activity Tolerance Patient tolerated treatment well;Patient limited by pain    Behavior During Therapy [facility] for tasks assessed/performed             Past Medical History:  Diagnosis Date   allergic rhinitis    Anxiety    AR (allergic rhinitis)    Asthma    Depression    Endometriosis    GERD (gastroesophageal reflux disease)    H/O migraine    Herniated intervertebral disc    History of diverticulitis of colon    Hyperlipidemia    Hypertension    IBS (irritable bowel syndrome)    Irritable bowel syndrome    Nonobstructive CAD (coronary artery disease)    a. 11/2019 Cor CTA: Cor Ca2+ = 141 (95th %'ile). LAD 25-49p.   Psoriasis    Tobacco abuse     Past Surgical History:  Procedure Laterality Date   ABDOMINAL HYSTERECTOMY  2003   tvhuso   BREAST BIOPSY Left 08/2014   benign-BENIGN BREAST TISSUE WITH FIBROSIS   DILATION AND CURETTAGE OF UTERUS      There were no vitals filed for this visit.   Subjective Assessment - 03/06/21 1721     Subjective Pt reports that exercises have been going well and that she has been doing well at work. She has been limiting her lifting at work.    Pertinent History Reduction in rom and onset of pain coincides with lifting 4 weeks ago. Concern for shoulder impingement and/or rotator cuff injury. We will trial aggressive icing regimen, voltaren gel and if no improvement consult orthopedics. Pain closer to  elbow itself raises suspicion for with overuse syndrome , question lateral epicondylitis. Pending XRays. Follow up in 6 weeks (10/03/20)    Limitations Other (comment)   Laying down on shoulder or picking up heavy object   How long can you sit comfortably? No problems sitting    How long can you stand comfortably? No problems standing    How long can you walk comfortably? No problems walking    Diagnostic tests X-ray no remarkable results    Patient Stated Goals Wants to avoid shoulder surgery and place pressure on arm without feeling pain and liftt without feeling pain.    Currently in Pain? No/denies    Pain Onset More than a month ago            THEREX:  Seated Shoulder Left ER at 60 degrees abduction with #3  3 x 10  Shoulder Flexion with #3 DB 3 x 10  Shoulder Abduction with #2 DB 3 x 10   Discussion about plan of care   Updated HEP and educated patient on changes to exercises and addition of new exercises         PT Education - 03/06/21 1729     Education Details form/technique for exercise    Person(s) Educated  Patient    Methods Explanation;Demonstration;Verbal cues;Handout    Comprehension Returned demonstration;Verbalized understanding;Verbal cues required              PT Short Term Goals - 03/06/21 1732       PT SHORT TERM GOAL #1   Title Pt will demonstrate understanding of HEP.    Time 2    Period Weeks    Status Achieved    Target Date 01/11/21               PT Long Term Goals - 03/06/21 1732       PT LONG TERM GOAL #1   Title Pt will improve bilateral shoulder flexion AROM to >=140 degrees to be able to perform overhead activities for job and for self-grooming.    Baseline 6/16: 115 bilateral 7/11: R flex 130, L 140 7/27: R/L abduction 140/140    Time 8    Period Weeks    Status Achieved      PT LONG TERM GOAL #2   Title Pt will improve combined IR of right shoulder to be symmetrical with left shoulder in order to perform self  grooming activities.    Baseline 7/11: L T12, R PSIS 7/27: L T12, R PSIS    Time 8    Period Weeks    Status Partially Met      PT LONG TERM GOAL #3   Title Pt will improve shoulder ER of right shoulder to be symmetrical with left shoulder to be able to perform overhead reaching activities for her job.    Baseline 6/16: Right shoulder ER 30 7/18: R ER 50 L IR 80 7/27: R/L 40/50    Time 8    Period Weeks    Status Partially Met      PT LONG TERM GOAL #4   Title Pt will improve FOTO score to target score to indicate clinically significant improvement with functional ability.    Baseline 6/16: 56, target score of 67 7/11: 56, target is 67 7/27: 63/67    Time 8    Period Weeks    Status Partially Met                   Plan - 03/06/21 1731     Clinical Impression Statement Pt presents for f/u for R shoulder pain 2/2 to SAIS and possible biceps tendinopathy. She demonstrates improvements with shoulder ROM and strength with ability to complete shoulder ER at 60 degrees for an increased number of reps and without compensation and ability to complete shoulder abduction and flexion for increased reps and without shoulder elevation. Pt is nearing discharge and she will be reassessed next session.    Personal Factors and Comorbidities Time since onset of injury/illness/exacerbation;Comorbidity 1    Comorbidities Depression    Examination-Activity Limitations Bed Mobility;Dressing;Sleep;Lift    Examination-Participation Restrictions Occupation    Stability/Clinical Decision Making Stable/Uncomplicated    Rehab Potential Good    PT Frequency 2x / week    PT Duration 8 weeks    PT Next Visit Plan Reassessment of goals, FOTO, final HEP    PT Home [place] code [email or web address] and Agree with Plan of Care Patient             Patient will benefit from skilled therapeutic intervention in order to improve the following deficits and impairments:  Decreased range of  motion, Decreased strength, Pain  Visit Diagnosis: Chronic right shoulder pain  Tendinopathy of right rotator cuff  Subacromial impingement of right shoulder     Problem List Patient Active Problem List   Diagnosis Date Noted   Moderate episode of recurrent major depressive disorder ([place]) 10/29/2020   Right arm pain 10/03/2020   History of COVID-19 07/28/2020   Chest pain of uncertain etiology 58/72/7618   Dyspnea on exertion 10/22/2019   Palpitations 10/22/2019   PAC (premature atrial contraction) 10/22/2019   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   SVT (supraventricular tachycardia) ([place]) 08/15/2018   Thyroid nodule 10/28/2016   Ovarian cyst, right 09/30/2015   Endometriosis 08/10/2015   Status post vaginal hysterectomy 08/10/2015   Anxiety 08/10/2015   Obesity (BMI 30.0-34.9) 08/10/2015   Vitamin D deficiency 05/26/2015   Weight gain 02/22/2015   Menopause 09/02/2014   Visit for preventive health examination 08/24/2013   Snoring 08/24/2013   Symptoms, such as flushing, sleeplessness, headache, lack of concentration, associated with the menopause 08/24/2013   Encounter for drug screening 08/13/2013   Migraine headache 08/26/2012   Panic disorder without agoraphobia with mild panic attacks 08/26/2012   Bronchitis, chronic ([place]) 05/09/2012   History of diverticulitis of colon    GERD (gastroesophageal reflux disease)    Hyperlipidemia    Irritable bowel syndrome    Essential hypertension    Herniated intervertebral disc    Tobacco use 03/04/2012   Bradly Chris PT, DPT  03/06/2021, 6:04 PM  [facility] [address] [place], [address] Phone: [phone]   Fax:  [phone]  Name: HENRETTER PIEKARSKI MRN: [id number] Date of Birth: Jan 05, 1961

## 2021-03-13 ENCOUNTER — Ambulatory Visit: Payer: BC Managed Care – PPO | Admitting: Physical Therapy

## 2021-03-13 DIAGNOSIS — M67911 Unspecified disorder of synovium and tendon, right shoulder: Secondary | ICD-10-CM | POA: Diagnosis not present

## 2021-03-13 DIAGNOSIS — M25511 Pain in right shoulder: Secondary | ICD-10-CM | POA: Diagnosis not present

## 2021-03-13 DIAGNOSIS — M7541 Impingement syndrome of right shoulder: Secondary | ICD-10-CM | POA: Diagnosis not present

## 2021-03-13 DIAGNOSIS — G8929 Other chronic pain: Secondary | ICD-10-CM | POA: Diagnosis not present

## 2021-03-13 NOTE — Therapy (Signed)
[facility] [address], [place], [address] Phone: [phone]   Fax:  [phone]  Physical Therapy Treatment/Discharge Summary  Patient Details  Name: Colleen Washington MRN: [id number] Date of Birth: Jul 17, 1960 Referring Provider (PT): tullo,teresa   Encounter Date: 03/13/2021   PT End of Session - 03/13/21 1652     Visit Number 17    Number of Visits 17    Date for PT Re-Evaluation 02/22/21    PT Start Time 1635    PT Stop Time 6433    PT Time Calculation (min) 40 min    Activity Tolerance Patient tolerated treatment well;Patient limited by pain    Behavior During Therapy [facility] for tasks assessed/performed             Past Medical History:  Diagnosis Date   allergic rhinitis    Anxiety    AR (allergic rhinitis)    Asthma    Depression    Endometriosis    GERD (gastroesophageal reflux disease)    H/O migraine    Herniated intervertebral disc    History of diverticulitis of colon    Hyperlipidemia    Hypertension    IBS (irritable bowel syndrome)    Irritable bowel syndrome    Nonobstructive CAD (coronary artery disease)    a. 11/2019 Cor CTA: Cor Ca2+ = 141 (95th %'ile). LAD 25-49p.   Psoriasis    Tobacco abuse     Past Surgical History:  Procedure Laterality Date   ABDOMINAL HYSTERECTOMY  2003   tvhuso   BREAST BIOPSY Left 08/2014   benign-BENIGN BREAST TISSUE WITH FIBROSIS   DILATION AND CURETTAGE OF UTERUS      There were no vitals filed for this visit.   Subjective Assessment - 03/13/21 1641     Subjective Pt reports that she has not been lifting much at work because of spasm. She has been able to do all her exercises and she can lift higher than she could before.    Pertinent History Reduction in rom and onset of pain coincides with lifting 4 weeks ago. Concern for shoulder impingement and/or rotator cuff injury. We will trial aggressive icing regimen, voltaren gel and if no  improvement consult orthopedics. Pain closer to elbow itself raises suspicion for with overuse syndrome , question lateral epicondylitis. Pending XRays. Follow up in 6 weeks (10/03/20)    Limitations Other (comment)   Laying down on shoulder or picking up heavy object   How long can you sit comfortably? No problems sitting    How long can you stand comfortably? No problems standing    How long can you walk comfortably? No problems walking    Diagnostic tests X-ray no remarkable results    Patient Stated Goals Wants to avoid shoulder surgery and place pressure on arm without feeling pain and liftt without feeling pain.    Currently in Pain? No/denies    Pain Onset More than a month ago            PHYSICAL PERFORMANCE   ROM: AROM and PROM   Shoulder IR R/L: L3*, T12  Shoulder ER AT 90 deg abduction R/L: 25*/50  Took FOTO    THEREX    Reviewed home exercise plan and discussed exercise progression and provided patient with therabands for increased resistance if needed.   Demonstrated doorway pec stretch for patient and added to her HEP given shoulder ER restrictions.      PT Short  Term Goals - 03/13/21 1642       PT SHORT TERM GOAL #1   Title Pt will demonstrate understanding of HEP.    Time 2    Period Weeks    Status Achieved    Target Date 01/11/21               PT Long Term Goals - 03/13/21 1642       PT LONG TERM GOAL #1   Title Pt will improve bilateral shoulder flexion AROM to >=140 degrees to be able to perform overhead activities for job and for self-grooming.    Baseline 6/16: 115 bilateral 7/11: R flex 130, L 140 7/27: R/L abduction 140/140    Time 8    Period Weeks    Status Achieved      PT LONG TERM GOAL #2   Title Pt will improve combined IR of right shoulder to be symmetrical with left shoulder in order to perform self grooming activities.    Baseline 7/11: L T12, R PSIS 7/27: L T12, R PSIS 8/30: L T12 R L3    Time 8    Period Weeks     Status Not Met      PT LONG TERM GOAL #3   Title Pt will improve shoulder ER of right shoulder to be symmetrical with left shoulder to be able to perform overhead reaching activities for her job.    Baseline 6/16: Right shoulder ER 30 7/18: R ER 50 L IR 80 7/27: R/L 40/50  8/30: R/L 25/50    Time 8    Period Weeks    Status Not Met      PT LONG TERM GOAL #4   Title Pt will improve FOTO score to target score to indicate clinically significant improvement with functional ability.    Baseline 6/16: 56, target score of 67 7/11: 56, target is 67 7/27: 63/67 8/30: 74/67    Time 8    Period Weeks    Status Achieved                   Plan - 03/13/21 1652     Personal Factors and Comorbidities Time since onset of injury/illness/exacerbation;Comorbidity 1    Comorbidities Depression    Examination-Activity Limitations Bed Mobility;Dressing;Sleep;Lift    Examination-Participation Restrictions Occupation    Stability/Clinical Decision Making Stable/Uncomplicated    Rehab Potential Good    PT Frequency 2x / week    PT Duration 8 weeks    PT Next Visit Plan Reassessment of goals, FOTO, final HEP    PT [place] code [place] and Agree with Plan of Care Patient            HEP includes following:  Access Code: [email or web address] URL: [email or web address] Date: 03/13/2021 Prepared by: Bradly Chris  Exercises Seated Upper Trapezius Stretch - 1 x daily - 7 x weekly - 1 sets - 5 reps - 30 hold Bicep Stretch at Table - 1 x daily - 3 x weekly - 1 sets - 5 reps - 30 hold Seated Shoulder Row with Anchored Resistance - 1 x daily - 3 x weekly - 3 sets - 10 reps Standing Shoulder Horizontal Abduction with Resistance - 1 x daily - 3 x weekly - 2 sets - 10 reps Standing Shoulder External Rotation with Resistance at 45 Degrees of Abduction - 1 x daily - 3 x weekly - 2 sets - 10 reps Standing Shoulder Flexion with  Resistance - 1 x daily - 3 x weekly  - 2 sets - 10 reps Wall Push Up with Plus - 1 x daily - 3 x weekly - 2 sets - 10 reps Standing Low Trap Setting with Resistance at [place] - 1 x daily - 3 x weekly - 2 sets - 10 reps Chest Press with Resistance - 1 x daily - 3 x weekly - 2 sets - 10 reps Standing Single Arm Shoulder Abduction with Resistance - 1 x daily - 3 x weekly - 2 sets - 10 reps Standing Lat Pull Down with Resistance-Elbows Extension - 1 x daily - 3 x weekly - 3 sets - 10 reps   Patient will benefit from skilled therapeutic intervention in order to improve the following deficits and impairments:  Decreased range of motion, Decreased strength, Pain  Visit Diagnosis: Chronic right shoulder pain  Tendinopathy of right rotator cuff     Problem List Patient Active Problem List   Diagnosis Date Noted   Moderate episode of recurrent major depressive disorder ([place]) 10/29/2020   Right arm pain 10/03/2020   History of COVID-19 07/28/2020   Chest pain of uncertain etiology 93/79/0240   Dyspnea on exertion 10/22/2019   Palpitations 10/22/2019   PAC (premature atrial contraction) 10/22/2019   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   SVT (supraventricular tachycardia) ([place]) 08/15/2018   Thyroid nodule 10/28/2016   Ovarian cyst, right 09/30/2015   Endometriosis 08/10/2015   Status post vaginal hysterectomy 08/10/2015   Anxiety 08/10/2015   Obesity (BMI 30.0-34.9) 08/10/2015   Vitamin D deficiency 05/26/2015   Weight gain 02/22/2015   Menopause 09/02/2014   Visit for preventive health examination 08/24/2013   Snoring 08/24/2013   Symptoms, such as flushing, sleeplessness, headache, lack of concentration, associated with the menopause 08/24/2013   Encounter for drug screening 08/13/2013   Migraine headache 08/26/2012   Panic disorder without agoraphobia with mild panic attacks 08/26/2012   Bronchitis, chronic ([place]) 05/09/2012   History of diverticulitis of colon    GERD (gastroesophageal reflux disease)     Hyperlipidemia    Irritable bowel syndrome    Essential hypertension    Herniated intervertebral disc    Tobacco use 03/04/2012   Bradly Chris PT, DPT  03/13/2021, 5:18 PM  [facility] [address] [place], [address] Phone: [phone]   Fax:  [phone]  Name: PARADISE VENSEL MRN: [id number] Date of Birth: 1961-03-20

## 2021-03-15 ENCOUNTER — Telehealth: Payer: Self-pay

## 2021-03-15 NOTE — Telephone Encounter (Signed)
Spoke with patient regarding pharmacy issues. Patient's insurance is not accepting that she needs the second loading dose (week 4). I spoke with pharmacy and insurance yesterday and was advised a Plan Limitation authorization will be needed. Patient is due to for injection next week. Advised patient we could provide sample to get her through this loop and be on maintenance medication.

## 2021-03-19 ENCOUNTER — Encounter (INDEPENDENT_AMBULATORY_CARE_PROVIDER_SITE_OTHER): Payer: Self-pay

## 2021-03-22 ENCOUNTER — Other Ambulatory Visit: Payer: Self-pay | Admitting: Internal Medicine

## 2021-03-26 ENCOUNTER — Ambulatory Visit: Payer: BC Managed Care – PPO | Admitting: Dermatology

## 2021-03-26 ENCOUNTER — Other Ambulatory Visit: Payer: Self-pay

## 2021-03-26 ENCOUNTER — Encounter: Payer: Self-pay | Admitting: Dermatology

## 2021-03-26 DIAGNOSIS — L409 Psoriasis, unspecified: Secondary | ICD-10-CM | POA: Diagnosis not present

## 2021-03-26 DIAGNOSIS — L309 Dermatitis, unspecified: Secondary | ICD-10-CM | POA: Diagnosis not present

## 2021-03-26 MED ORDER — VTAMA 1 % EX CREA: 1.0000 "application " | TOPICAL_CREAM | Freq: Every day | CUTANEOUS | 2 refills | Status: DC

## 2021-03-26 NOTE — Patient Instructions (Signed)
Triamcinolone cream - Apply to rash on abdomen twice daily until improved. Avoid face, groin, underarms.   If you have any questions or concerns for your doctor, please call our main line at [phone] and press option 4 to reach your doctor's medical assistant. If no one answers, please leave a voicemail as directed and we will return your call as soon as possible. Messages left after 4 pm will be answered the following business day.   You may also send [place] a message via [place]. We typically respond to MyChart messages within 1-2 business days.  For prescription refills, please ask your pharmacy to contact our office. Our fax number is [phone].  If you have an urgent issue when the clinic is closed that cannot wait until the next business day, you can page your doctor at the number below.    Please note that while we do our best to be available for urgent issues outside of office hours, we are not available 24/7.   If you have an urgent issue and are unable to reach [place], you may choose to seek medical care at your doctor's office, retail clinic, urgent care center, or emergency room.  If you have a medical emergency, please immediately call 911 or go to the emergency department.  Pager Numbers  - Dr. Nehemiah Massed: [phone]  - Dr. Laurence Ferrari: [phone]  - Dr. Nicole Kindred: [phone]  In the event of inclement weather, please call our main line at [phone] for an update on the status of any delays or closures.  Dermatology Medication Tips: Please keep the boxes that topical medications come in in order to help keep track of the instructions about where and how to use these. Pharmacies typically print the medication instructions only on the boxes and not directly on the medication tubes.   If your medication is too expensive, please contact our office at [phone] option 4 or send [place] a message through [place].   We are unable to tell what your co-pay for medications will be in  advance as this is different depending on your insurance coverage. However, we may be able to find a substitute medication at lower cost or fill out paperwork to get insurance to cover a needed medication.   If a prior authorization is required to get your medication covered by your insurance company, please allow [place] 1-2 business days to complete this process.  Drug prices often vary depending on where the prescription is filled and some pharmacies may offer cheaper prices.  The website www.goodrx.com contains coupons for medications through different pharmacies. The prices here do not account for what the cost may be with help from insurance (it may be cheaper with your insurance), but the website can give you the price if you did not use any insurance.  - You can print the associated coupon and take it with your prescription to the pharmacy.  - You may also stop by our office during regular business hours and pick up a GoodRx coupon card.  - If you need your prescription sent electronically to a different pharmacy, notify our office through [facility] or by phone at [phone] option 4.

## 2021-03-26 NOTE — Progress Notes (Signed)
   Follow-Up Visit   Subjective  Colleen Washington is a 60 y.o. female who presents for the following: Rash (Right lower abdomen, noticed last night. Itchy and burns. She has used Calamine lotion for itch. She didn't notice any sensation in this area prior to rash. She is concerned rash could be shingles or a reaction from [place].). She has had the 2 loading doses of Skyrizi, no injection problems. She has noticed some improvement, but still has a lot of peeling of the palms and soles. She is also using Tazorac 0.1% Cream, Enstilar Foam, and Halobetasol Cream.   The following portions of the chart were reviewed this encounter and updated as appropriate:       Review of Systems:  No other skin or systemic complaints except as noted in HPI or Assessment and Plan.  Objective  Well appearing patient in no apparent distress; mood and affect are within normal limits.  A focused examination was performed including abdomen. Relevant physical exam findings are noted in the Assessment and Plan.  hands, feet Mild erythema with more extensive hyperkeratosis of the heels; mild erythema and mild hyperkeratosis of the palms.  Right Abdomen Pink patch.   Assessment & Plan  Psoriasis hands, feet  Some improvement on Skirizi, not at goal  Psoriasis is a chronic non-curable, but treatable genetic/hereditary disease that may have other systemic features affecting other organ systems such as joints (Psoriatic Arthritis). It is associated with an increased risk of inflammatory bowel disease, heart disease, non-alcoholic fatty liver disease, and depression.    Continue Skyrizi injections q 12 weeks for maintenance.  Start Vtama Cream Apply to AA psoriasis QD Patient will d/c Enstilar, Tazorac, and halobetasol while starting Vtama cream to see if there is improvement.   Reviewed risks of biologics including immunosuppression, infections, injection site reaction, and failure to improve condition. Goal is  control of skin condition, not cure.  Some older biologics such as Humira and Enbrel may slightly increase risk of malignancy and may worsen congestive heart failure. The use of biologics requires long term medication management, including periodic office visits and monitoring of blood work.   Tapinarof (VTAMA) 1 % CREA - hands, feet Apply 1 application topically daily. To affected areas psoriasis.  Related Medications tazarotene (TAZORAC) 0.1 % cream Apply to hands and feet every night as needed for psoriasis.  acitretin (SORIATANE) 25 MG capsule Take 1 capsule (25 mg total) by mouth daily before breakfast.  halobetasol (ULTRAVATE) 0.05 % cream Apply together with tazarotene 0.1% cream 1-2 times a day as needed.  Risankizumab-rzaa (SKYRIZI) 150 MG/ML SOSY Inject 150 mg into the skin as directed. Every 12 weeks for maintenance.  Calcipotriene-Betameth Diprop (ENSTILAR) 0.005-0.064 % FOAM APPLY TO AFFECTED AREA OF PALMS AND SUNS ONCE DAILY TO TWICE DAILY UNTIL IMPROVED. AVOID FACE, GROIN, AND UNDERARMS  Dermatitis Right Abdomen  Start TMC 0.1% Cream Apply BID to AA rash until improved. Pt has at home. Avoid face, groin, axilla.   Return as scheduled.  IJamesetta Orleans, CMA, am acting as scribe for Brendolyn Patty, MD .  Documentation: I have reviewed the above documentation for accuracy and completeness, and I agree with the above.  Brendolyn Patty MD

## 2021-03-29 ENCOUNTER — Encounter: Payer: Self-pay | Admitting: Dermatology

## 2021-03-29 DIAGNOSIS — B029 Zoster without complications: Secondary | ICD-10-CM

## 2021-03-29 MED ORDER — VALACYCLOVIR HCL 1 G PO TABS: 1000.0000 mg | ORAL_TABLET | Freq: Three times a day (TID) | ORAL | 0 refills | Status: AC

## 2021-03-29 NOTE — Telephone Encounter (Signed)
Please advise patient - Unfortunately it is hard to tell from photos. I think this is likely not shingles since Dr. Nicole Kindred did not think it was on the exam from earlier this week, but I cannot tell from looking at the photos for sure if this is dermatitis/eczema with excoriations (scratches) or if it could be shingles. We can treat with valacyclovir 100 mg q8 hours x 7 days to cover for shingles just to be safe. It should be taken with a large glass of water and can sometimes cause dizziness. Thank you!

## 2021-04-02 ENCOUNTER — Other Ambulatory Visit: Payer: Self-pay | Admitting: Dermatology

## 2021-04-02 DIAGNOSIS — L409 Psoriasis, unspecified: Secondary | ICD-10-CM

## 2021-04-06 ENCOUNTER — Other Ambulatory Visit: Payer: Self-pay | Admitting: Nurse Practitioner

## 2021-04-16 DIAGNOSIS — M5134 Other intervertebral disc degeneration, thoracic region: Secondary | ICD-10-CM | POA: Diagnosis not present

## 2021-04-16 DIAGNOSIS — M9901 Segmental and somatic dysfunction of cervical region: Secondary | ICD-10-CM | POA: Diagnosis not present

## 2021-04-16 DIAGNOSIS — M9902 Segmental and somatic dysfunction of thoracic region: Secondary | ICD-10-CM | POA: Diagnosis not present

## 2021-04-16 DIAGNOSIS — M62830 Muscle spasm of back: Secondary | ICD-10-CM | POA: Diagnosis not present

## 2021-05-03 ENCOUNTER — Other Ambulatory Visit: Payer: Self-pay | Admitting: Internal Medicine

## 2021-05-07 ENCOUNTER — Encounter: Payer: Self-pay | Admitting: Dermatology

## 2021-05-07 ENCOUNTER — Other Ambulatory Visit: Payer: Self-pay | Admitting: Internal Medicine

## 2021-05-07 DIAGNOSIS — Z1231 Encounter for screening mammogram for malignant neoplasm of breast: Secondary | ICD-10-CM

## 2021-05-08 ENCOUNTER — Other Ambulatory Visit: Payer: Self-pay

## 2021-05-08 MED ORDER — SKYRIZI PEN 150 MG/ML ~~LOC~~ SOAJ: 150.0000 mg | SUBCUTANEOUS | 2 refills | Status: DC

## 2021-05-17 ENCOUNTER — Encounter: Payer: Self-pay | Admitting: Internal Medicine

## 2021-05-17 ENCOUNTER — Ambulatory Visit: Payer: BC Managed Care – PPO | Admitting: Internal Medicine

## 2021-05-17 ENCOUNTER — Other Ambulatory Visit: Payer: Self-pay

## 2021-05-17 VITALS — BP 100/74 | HR 83 | Ht 67.5 in | Wt 191.0 lb

## 2021-05-17 DIAGNOSIS — I2510 Atherosclerotic heart disease of native coronary artery without angina pectoris: Secondary | ICD-10-CM | POA: Diagnosis not present

## 2021-05-17 DIAGNOSIS — I10 Essential (primary) hypertension: Secondary | ICD-10-CM

## 2021-05-17 DIAGNOSIS — I491 Atrial premature depolarization: Secondary | ICD-10-CM

## 2021-05-17 DIAGNOSIS — R002 Palpitations: Secondary | ICD-10-CM

## 2021-05-17 DIAGNOSIS — R600 Localized edema: Secondary | ICD-10-CM | POA: Diagnosis not present

## 2021-05-17 DIAGNOSIS — I471 Supraventricular tachycardia: Secondary | ICD-10-CM

## 2021-05-17 DIAGNOSIS — E785 Hyperlipidemia, unspecified: Secondary | ICD-10-CM

## 2021-05-17 MED ORDER — ATORVASTATIN CALCIUM 40 MG PO TABS: 40.0000 mg | ORAL_TABLET | Freq: Every day | ORAL | 0 refills | Status: DC

## 2021-05-17 MED ORDER — ASPIRIN EC 81 MG PO TBEC: 81.0000 mg | DELAYED_RELEASE_TABLET | Freq: Every day | ORAL | Status: DC

## 2021-05-17 NOTE — Progress Notes (Signed)
Follow-up Outpatient Visit Date: 05/17/2021  Primary Care Provider: Crecencio Mc, MD [place] [address]  Chief Complaint: Follow-up coronary artery disease  HPI:  Ms. Petrucci is a 60 y.o. female with history of hypertension, hyperlipidemia, irritable bowel syndrome, diverticulitis, endometriosis, depression, anxiety, and GERD, who presents for follow-up of chest pain and nonobstructive coronary artery disease.  She was last seen in our office in 12/2019 by Ignacia Bayley, NP, after preceding coronary CTA showed mild-moderate proximal LAD disease with less than 50% stenosis.  She has been treated with statin therapy to prevent progression of disease and has felt well without chest pain.  She initially had some muscle aches that she attributed to atorvastatin, though her myalgias have resolved with dietary changes while remaining on statin therapy.  She denies shortness of breath, palpitations, and lightheadedness.  She was recently started on spironolactone by Dr. Derrel Nip for a little "puffiness" in her legs.  Ms. Akridge has not needed to use her as needed propranolol for a long time.  She recently started a new diet and has already lost about 15 pounds over the last 5 weeks.  She hopes to achieve a weight of around 165 pounds.  --------------------------------------------------------------------------------------------------  Past Medical History:  Diagnosis Date   allergic rhinitis    Anxiety    AR (allergic rhinitis)    Asthma    Depression    Endometriosis    GERD (gastroesophageal reflux disease)    H/O migraine    Herniated intervertebral disc    History of diverticulitis of colon    Hyperlipidemia    Hypertension    IBS (irritable bowel syndrome)    Irritable bowel syndrome    Nonobstructive CAD (coronary artery disease)    a. 11/2019 Cor CTA: Cor Ca2+ = 141 (95th %'ile). LAD 25-49p.   Psoriasis    Tobacco abuse    Past Surgical History:  Procedure  Laterality Date   ABDOMINAL HYSTERECTOMY  2003   tvhuso   BREAST BIOPSY Left 08/2014   benign-BENIGN BREAST TISSUE WITH FIBROSIS   DILATION AND CURETTAGE OF UTERUS       Current Meds  Medication Sig   albuterol (VENTOLIN HFA) 108 (90 Base) MCG/ACT inhaler INHALE 1-2 PUFFS INTO THE LUNGS EVERY 4 HOURS AS NEEDED FOR WHEEZING OR SHORTNESS OF BREATH.   aspirin EC 81 MG tablet Take 1 tablet (81 mg total) by mouth daily. Swallow whole.   atorvastatin (LIPITOR) 40 MG tablet Take 1 tablet (40 mg total) by mouth daily.   buPROPion (WELLBUTRIN XL) 300 MG 24 hr tablet TAKE 1 TABLET BY MOUTH EVERY DAY   busPIRone (BUSPAR) 15 MG tablet Take 1 tablet (15 mg total) by mouth 2 (two) times daily as needed.   Calcipotriene-Betameth Diprop (ENSTILAR) 0.005-0.064 % FOAM APPLY TO AFFECTED AREA OF PALMS AND SUNS ONCE DAILY TO TWICE DAILY UNTIL IMPROVED. AVOID FACE, GROIN, AND UNDERARMS   cetirizine (ZYRTEC) 10 MG tablet Take 10 mg by mouth daily. Reported on 09/12/2015   famotidine (PEPCID) 40 MG tablet TAKE 1 TABLET BY MOUTH EVERYDAY AT BEDTIME   fluticasone (FLONASE) 50 MCG/ACT nasal spray USE 2 SPRAYS EACH NOSTRIL DAILY   halobetasol (ULTRAVATE) 0.05 % cream Apply together with tazarotene 0.1% cream 1-2 times a day as needed.   hydrOXYzine (VISTARIL) 25 MG capsule Take 1 capsule (25 mg total) by mouth every 8 (eight) hours as needed.   pantoprazole (PROTONIX) 40 MG tablet TAKE 1 TABLET BY MOUTH EVERY DAY  promethazine (PHENERGAN) 12.5 MG tablet TAKE ONE TABLET BY MOUTH EVERY 6 HOURS AS NEEDED FOR NAUSEA AND VOMITING.   propranolol (INDERAL) 10 MG tablet TAKE 1 TABLET (10 MG TOTAL) BY MOUTH 3 (THREE) TIMES DAILY. AS NEEDED FOR RAPID HEART RATE   Risankizumab-rzaa (SKYRIZI PEN) 150 MG/ML SOAJ Inject 150 mg into the skin as directed. Every 12 weeks for maintenance.   spironolactone (ALDACTONE) 25 MG tablet Take 1 tablet (25 mg total) by mouth daily.   Tapinarof (VTAMA) 1 % CREA Apply 1 application topically  daily. To affected areas psoriasis.   tazarotene (AVAGE) 0.1 % cream APPLY TO HANDS AND FEET EVERY NIGHT AS NEEDED FOR PSORIASIS.   Vitamin D, Ergocalciferol, (DRISDOL) 1.25 MG (50000 UNIT) CAPS capsule TAKE 1 CAPSULE BY MOUTH ONE TIME PER WEEK    Allergies: Compazine [prochlorperazine]  Social History   Tobacco Use   Smoking status: Former    Packs/day: 0.25    Years: 20.00    Pack years: 5.00    Types: Cigarettes   Smokeless tobacco: Never   Tobacco comments:    2-3 cigarettes daily   Vaping Use   Vaping Use: Never used  Substance Use Topics   Alcohol use: Yes    Alcohol/week: 0.0 standard drinks    Comment: occas   Drug use: No    Family History  Problem Relation Age of Onset   Arthritis Mother    Hyperlipidemia Mother    Cancer Father        colon   Arthritis Maternal Grandmother    Hyperlipidemia Maternal Grandfather    Diabetes Brother    Breast cancer Paternal Aunt 55   Heart disease Neg Hx    Colon cancer Neg Hx     Review of Systems: A 12-system review of systems was performed and was negative except as noted in the HPI.  --------------------------------------------------------------------------------------------------  Physical Exam: BP 100/74 (BP Location: Left Arm, Patient Position: Sitting, Cuff Size: Large)   Pulse 83   Ht 5' 7.5" (1.715 m)   Wt 191 lb (86.6 kg)   SpO2 98%   BMI 29.47 kg/m   General:  NAD. Neck: No JVD or HJR. Lungs: Clear to auscultation bilaterally without wheezes or crackles. Heart: Regular rate and rhythm without murmurs, rubs, or gallops. Abdomen: Soft, nontender, nondistended. Extremities: No lower extremity edema.  EKG: Normal sinus rhythm with low voltage and poor R wave progression, likely due to lead placement.  No significant change from prior tracing on 12/16/2019.  Lab Results  Component Value Date   WBC 5.3 06/28/2020   HGB 13.3 06/28/2020   HCT 39.5 06/28/2020   MCV 87.6 06/28/2020   PLT 167.0 06/28/2020     Lab Results  Component Value Date   NA 140 12/06/2020   K 3.8 12/06/2020   CL 104 12/06/2020   CO2 28 12/06/2020   BUN 13 12/06/2020   CREATININE 0.88 12/06/2020   GLUCOSE 99 12/06/2020   ALT 17 12/06/2020    Lab Results  Component Value Date   CHOL 171 06/28/2020   HDL 52.30 06/28/2020   LDLCALC 92 06/28/2020   TRIG 131.0 06/28/2020   CHOLHDL 3 06/28/2020    --------------------------------------------------------------------------------------------------  ASSESSMENT AND PLAN: Coronary artery disease: No chest pain reported with coronary CTA last year demonstrating mild-moderate LAD disease.  We have agreed to continue with atorvastatin 40 mg daily, with most recent LDL being reasonable at 92.  I am hopeful that with her recent lifestyle modifications to help  lose weight and continued statin therapy, that her lipids will improve even further.  We discussed risks and benefits of low-dose aspirin in the setting of moderate CAD and have agreed to add aspirin 81 mg daily.  Palpitations: No palpitations reported.  Ms. Wermuth can continue to use propranolol on an as-needed basis were palpitations to recur.  Leg edema: Ms. Schindler notes that she has had some mild puffiness at times, prompting Dr. Derrel Nip to add spironolactone.  I see no significant edema today.  I encouraged Ms. Markie to speak with Dr. Derrel Nip about the need to continue this medication long-term, as well as appropriate follow-up testing to ensure potassium and renal function remained stable.  Hypertension: Blood pressure low normal today.  As above, it may be worthwhile to discontinue standing spironolactone.  I will defer this to Dr. Derrel Nip.  Hyperlipidemia: Lipids reasonable on last check.  Continue atorvastatin 40 mg daily and ongoing lifestyle modifications.  Follow-up: Return to clinic in 1 year.  Nelva Bush, MD 05/18/2021 6:53 AM

## 2021-05-17 NOTE — Patient Instructions (Signed)
Medication Instructions:   Your physician has recommended you make the following change in your medication:   START Aspirin 81 mg daily - you may purchase over-the-counter  *If you need a refill on your cardiac medications before your next appointment, please call your pharmacy*   Lab Work:  None ordered  Testing/Procedures:  None ordered   Follow-Up: At [facility], you and your health needs are our priority.  As part of our continuing mission to provide you with exceptional heart care, we have created designated Provider Care Teams.  These Care Teams include your primary Cardiologist (physician) and Advanced Practice Providers (APPs -  Physician Assistants and Nurse Practitioners) who all work together to provide you with the care you need, when you need it.  We recommend signing up for the patient portal called "MyChart".  Sign up information is provided on this After Visit Summary.  MyChart is used to connect with patients for Virtual Visits (Telemedicine).  Patients are able to view lab/test results, encounter notes, upcoming appointments, etc.  Non-urgent messages can be sent to your provider as well.   To learn more about what you can do with MyChart, go to [email or web address].    Your next appointment:   1 year(s)  The format for your next appointment:   In Person  Provider:   You may see Nelva Bush, MD or one of the following Advanced Practice Providers on your designated Care Team:   Murray Hodgkins, NP Christell Faith, PA-C Marrianne Mood, PA-C Cadence Kathlen Mody, [place]   Other Instructions  Please discuss with your primary care provider whether you need to stay on Spironolactone.

## 2021-05-18 ENCOUNTER — Encounter: Payer: Self-pay | Admitting: Internal Medicine

## 2021-05-18 DIAGNOSIS — R600 Localized edema: Secondary | ICD-10-CM | POA: Insufficient documentation

## 2021-05-18 DIAGNOSIS — I2510 Atherosclerotic heart disease of native coronary artery without angina pectoris: Secondary | ICD-10-CM | POA: Insufficient documentation

## 2021-05-28 DIAGNOSIS — M9902 Segmental and somatic dysfunction of thoracic region: Secondary | ICD-10-CM | POA: Diagnosis not present

## 2021-05-28 DIAGNOSIS — M9901 Segmental and somatic dysfunction of cervical region: Secondary | ICD-10-CM | POA: Diagnosis not present

## 2021-05-28 DIAGNOSIS — M5134 Other intervertebral disc degeneration, thoracic region: Secondary | ICD-10-CM | POA: Diagnosis not present

## 2021-05-28 DIAGNOSIS — M62830 Muscle spasm of back: Secondary | ICD-10-CM | POA: Diagnosis not present

## 2021-05-31 DIAGNOSIS — M5134 Other intervertebral disc degeneration, thoracic region: Secondary | ICD-10-CM | POA: Diagnosis not present

## 2021-05-31 DIAGNOSIS — M62830 Muscle spasm of back: Secondary | ICD-10-CM | POA: Diagnosis not present

## 2021-05-31 DIAGNOSIS — M9901 Segmental and somatic dysfunction of cervical region: Secondary | ICD-10-CM | POA: Diagnosis not present

## 2021-05-31 DIAGNOSIS — M9902 Segmental and somatic dysfunction of thoracic region: Secondary | ICD-10-CM | POA: Diagnosis not present

## 2021-06-22 ENCOUNTER — Encounter: Payer: BLUE CROSS/BLUE SHIELD | Admitting: Obstetrics and Gynecology

## 2021-06-25 ENCOUNTER — Ambulatory Visit: Payer: BC Managed Care – PPO | Admitting: Dermatology

## 2021-07-04 ENCOUNTER — Other Ambulatory Visit: Payer: Self-pay | Admitting: Internal Medicine

## 2021-07-04 MED ORDER — PROMETHAZINE HCL 12.5 MG PO TABS: 12.5000 mg | ORAL_TABLET | ORAL | 0 refills | Status: DC | PRN

## 2021-07-06 ENCOUNTER — Other Ambulatory Visit: Payer: Self-pay | Admitting: Internal Medicine

## 2021-07-10 DIAGNOSIS — M5134 Other intervertebral disc degeneration, thoracic region: Secondary | ICD-10-CM | POA: Diagnosis not present

## 2021-07-10 DIAGNOSIS — M62830 Muscle spasm of back: Secondary | ICD-10-CM | POA: Diagnosis not present

## 2021-07-10 DIAGNOSIS — M9902 Segmental and somatic dysfunction of thoracic region: Secondary | ICD-10-CM | POA: Diagnosis not present

## 2021-07-10 DIAGNOSIS — M9901 Segmental and somatic dysfunction of cervical region: Secondary | ICD-10-CM | POA: Diagnosis not present

## 2021-07-18 ENCOUNTER — Other Ambulatory Visit: Payer: Self-pay

## 2021-07-18 ENCOUNTER — Encounter: Payer: Self-pay | Admitting: Obstetrics and Gynecology

## 2021-07-18 ENCOUNTER — Ambulatory Visit: Payer: BC Managed Care – PPO | Admitting: Obstetrics and Gynecology

## 2021-07-18 VITALS — BP 117/85 | HR 90 | Ht 67.5 in | Wt 190.0 lb

## 2021-07-18 DIAGNOSIS — Z01419 Encounter for gynecological examination (general) (routine) without abnormal findings: Secondary | ICD-10-CM

## 2021-07-18 NOTE — Progress Notes (Signed)
HPI:      [name] is a 61 y.o. [id number] who LMP was No LMP recorded. Patient has had a hysterectomy.  Subjective:   She presents today for her annual examination.  She states that over the last 6 months she has experienced a little bit more urinary incontinence especially at night.  She is finding that she has to wear a pad.  Coincident with this is a change in her drinking habits.  She is consuming large amounts of water daily as part of a weight loss diet. (Of significant note she has lost more than 15 pounds in the last 2 to 3 months).  She reports that she does not have urine loss during the day. Patient has had a prior hysterectomy for endometriosis.  She still has both ovaries.    Hx: The following portions of the patient's history were reviewed and updated as appropriate:             She  has a past medical history of allergic rhinitis, Anxiety, AR (allergic rhinitis), Asthma, Depression, Endometriosis, GERD (gastroesophageal reflux disease), H/O migraine, Herniated intervertebral disc, History of diverticulitis of colon, Hyperlipidemia, Hypertension, IBS (irritable bowel syndrome), Irritable bowel syndrome, Nonobstructive CAD (coronary artery disease), Psoriasis, and Tobacco abuse. She does not have any pertinent problems on file. She  has a past surgical history that includes Abdominal hysterectomy (2003); Dilation and curettage of uterus; and Breast biopsy (Left, 08/2014). Her family history includes Arthritis in her maternal grandmother and mother; Breast cancer (age of onset: 58) in her paternal aunt; Cancer in her father; Diabetes in her brother; Hyperlipidemia in her maternal grandfather and mother. She  reports that she has been smoking cigarettes. She has a 5.00 pack-year smoking history. She has never used smokeless tobacco. She reports current alcohol use. She reports that she does not use drugs. She has a current medication list which includes the following prescription(s):  albuterol, aspirin ec, atorvastatin, bupropion, buspirone, enstilar, cetirizine, famotidine, fluticasone, halobetasol, hydroxyzine, pantoprazole, promethazine, propranolol, skyrizi pen, spironolactone, vtama, tazarotene, and vitamin d (ergocalciferol). She is allergic to compazine [prochlorperazine].       Review of Systems:  Review of Systems  Constitutional: Denied constitutional symptoms, night sweats, recent illness, fatigue, fever, insomnia and weight loss.  Eyes: Denied eye symptoms, eye pain, photophobia, vision change and visual disturbance.  Ears/Nose/Throat/Neck: Denied ear, nose, throat or neck symptoms, hearing loss, nasal discharge, sinus congestion and sore throat.  Cardiovascular: Denied cardiovascular symptoms, arrhythmia, chest pain/pressure, edema, exercise intolerance, orthopnea and palpitations.  Respiratory: Denied pulmonary symptoms, asthma, pleuritic pain, productive sputum, cough, dyspnea and wheezing.  Gastrointestinal: Denied, gastro-esophageal reflux, melena, nausea and vomiting.  Genitourinary: See HPI for additional information.  Musculoskeletal: Denied musculoskeletal symptoms, stiffness, swelling, muscle weakness and myalgia.  Dermatologic: Denied dermatology symptoms, rash and scar.  Neurologic: Denied neurology symptoms, dizziness, headache, neck pain and syncope.  Psychiatric: Denied psychiatric symptoms, anxiety and depression.  Endocrine: Denied endocrine symptoms including hot flashes and night sweats.   Meds:   Current Outpatient Medications on File Prior to Visit  Medication Sig Dispense Refill   albuterol (VENTOLIN HFA) 108 (90 Base) MCG/ACT inhaler INHALE 1-2 PUFFS INTO THE LUNGS EVERY 4 HOURS AS NEEDED FOR WHEEZING OR SHORTNESS OF BREATH. 18 each 2   aspirin EC 81 MG tablet Take 1 tablet (81 mg total) by mouth daily. Swallow whole.     atorvastatin (LIPITOR) 40 MG tablet Take 1 tablet (40 mg total) by mouth daily. 90 tablet  0   buPROPion (WELLBUTRIN  XL) 300 MG 24 hr tablet TAKE 1 TABLET BY MOUTH EVERY DAY 90 tablet 1   busPIRone (BUSPAR) 15 MG tablet Take 1 tablet (15 mg total) by mouth 2 (two) times daily as needed. 180 tablet 2   Calcipotriene-Betameth Diprop (ENSTILAR) 0.005-0.064 % FOAM APPLY TO AFFECTED AREA OF PALMS AND SUNS ONCE DAILY TO TWICE DAILY UNTIL IMPROVED. AVOID FACE, GROIN, AND UNDERARMS 60 g 3   cetirizine (ZYRTEC) 10 MG tablet Take 10 mg by mouth daily. Reported on 09/12/2015     famotidine (PEPCID) 40 MG tablet TAKE 1 TABLET BY MOUTH EVERYDAY AT BEDTIME 90 tablet 1   fluticasone (FLONASE) 50 MCG/ACT nasal spray USE 2 SPRAYS EACH NOSTRIL DAILY 16 g 3   halobetasol (ULTRAVATE) 0.05 % cream Apply together with tazarotene 0.1% cream 1-2 times a day as needed. 50 g 2   hydrOXYzine (VISTARIL) 25 MG capsule Take 1 capsule (25 mg total) by mouth every 8 (eight) hours as needed. 60 capsule 0   pantoprazole (PROTONIX) 40 MG tablet TAKE 1 TABLET BY MOUTH EVERY DAY 90 tablet 3   promethazine (PHENERGAN) 12.5 MG tablet Take 1 tablet (12.5 mg total) by mouth every 4 (four) hours as needed for nausea or vomiting. 30 tablet 0   propranolol (INDERAL) 10 MG tablet TAKE 1 TABLET (10 MG TOTAL) BY MOUTH 3 (THREE) TIMES DAILY. AS NEEDED FOR RAPID HEART RATE 30 tablet 0   Risankizumab-rzaa (SKYRIZI PEN) 150 MG/ML SOAJ Inject 150 mg into the skin as directed. Every 12 weeks for maintenance. 1 mL 2   spironolactone (ALDACTONE) 25 MG tablet Take 1 tablet (25 mg total) by mouth daily. 90 tablet 3   Tapinarof (VTAMA) 1 % CREA Apply 1 application topically daily. To affected areas psoriasis. 60 g 2   tazarotene (AVAGE) 0.1 % cream APPLY TO HANDS AND FEET EVERY NIGHT AS NEEDED FOR PSORIASIS. 60 g 2   Vitamin D, Ergocalciferol, (DRISDOL) 1.25 MG (50000 UNIT) CAPS capsule TAKE 1 CAPSULE BY MOUTH ONE TIME PER WEEK (Patient not taking: Reported on 07/18/2021) 12 capsule 0   No current facility-administered medications on file prior to visit.     Objective:      Vitals:   07/18/21 0811  BP: 117/85  Pulse: 90    Filed Weights   07/18/21 0811  Weight: 190 lb (86.2 kg)              Physical examination General NAD, Conversant  HEENT Atraumatic; Op clear with mmm.  Normo-cephalic. Pupils reactive. Anicteric sclerae  Thyroid/Neck Smooth without nodularity or enlargement. Normal ROM.  Neck Supple.  Skin No rashes, lesions or ulceration. Normal palpated skin turgor. No nodularity.  Breasts: No masses or discharge.  Symmetric.  No axillary adenopathy.  Lungs: Clear to auscultation.No rales or wheezes. Normal Respiratory effort, no retractions.  Heart: NSR.  No murmurs or rubs appreciated. No periferal edema  Abdomen: Soft.  Non-tender.  No masses.  No HSM. No hernia  Extremities: Moves all appropriately.  Normal ROM for age. No lymphadenopathy.  Neuro: Oriented to PPT.  Normal mood. Normal affect.     Pelvic:   Vulva: Normal appearance.  No lesions.   Vagina: No lesions or abnormalities noted.  Support: Second-degree cystocele and rectocele  Urethra No masses tenderness or scarring.  Meatus Normal size without lesions or prolapse.  Cervix: Surgically absent   Anus: Normal exam.  No lesions.  Perineum: Normal exam.  No lesions.  Bimanual   Uterus: Surgically absent   Adnexae: No masses.  Non-tender to palpation.  Cul-de-sac: Negative for abnormality.     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   SVT (supraventricular  tachycardia) ([place]) 08/15/2018   Thyroid nodule 10/28/2016   Ovarian cyst, right 09/30/2015   Endometriosis 08/10/2015   Status post vaginal hysterectomy 08/10/2015   Anxiety 08/10/2015   Obesity (BMI 30.0-34.9) 08/10/2015   Vitamin D deficiency 05/26/2015   Weight gain 02/22/2015   Menopause 09/02/2014   Visit for preventive health examination 08/24/2013   Snoring 08/24/2013   Symptoms, such as flushing, sleeplessness, headache, lack of concentration, associated with the menopause 08/24/2013   Encounter for drug screening 08/13/2013   Migraine headache 08/26/2012   Panic disorder without agoraphobia with mild panic attacks 08/26/2012   Bronchitis, chronic ([place]) 05/09/2012   History of diverticulitis of colon    GERD (gastroesophageal reflux disease)    Hyperlipidemia    Irritable bowel syndrome    Essential hypertension    Herniated intervertebral disc    Tobacco use 03/04/2012     1. Well woman exam with routine gynecological exam     Some nocturia/urine loss noted to increased recently.   Plan:            1. Basic Screening Recommendations The basic screening recommendations for asymptomatic women were discussed with the patient during her visit.  The age-appropriate recommendations were discussed with her and the rational for the tests reviewed.  When I am informed by the patient that another primary care physician has previously obtained the age-appropriate tests and they are up-to-date, only outstanding tests are ordered and referrals given as necessary.  Abnormal results of tests will be discussed with her when all of her results are completed.  Routine preventative health maintenance measures emphasized: Exercise/Diet/Weight control, Tobacco Warnings, Alcohol/Substance use risks and Stress Management Patient encouraged to get her mammogram as ordered 2.  Discussed nighttime urine loss and restriction of fluids during during the evening.  I think it possible that her  increase in hydration especially in the evening has worsened her urine loss.  We have discussed the restriction in the evening and she is [place] for the next few months.  If she continues to experience urine loss possibility of medications for irritable bladder discussed.  Orders No orders of the defined types were placed in this encounter.   No orders of the defined types were placed in this encounter.        F/U  Return in about 1 year (around 07/18/2022) for Annual Physical.  Finis Bud, M.D. 07/18/2021 8:50 AM

## 2021-07-26 ENCOUNTER — Encounter: Payer: Self-pay | Admitting: Family Medicine

## 2021-07-26 ENCOUNTER — Telehealth (INDEPENDENT_AMBULATORY_CARE_PROVIDER_SITE_OTHER): Payer: BC Managed Care – PPO | Admitting: Family Medicine

## 2021-07-26 DIAGNOSIS — R059 Cough, unspecified: Secondary | ICD-10-CM

## 2021-07-26 MED ORDER — BENZONATATE 100 MG PO CAPS: ORAL_CAPSULE | ORAL | 0 refills | Status: DC

## 2021-07-26 MED ORDER — PREDNISONE 20 MG PO TABS: 40.0000 mg | ORAL_TABLET | Freq: Every day | ORAL | 0 refills | Status: DC

## 2021-07-26 NOTE — Patient Instructions (Signed)
-  I sent the medication(s) we discussed to your pharmacy: Meds ordered this encounter  Medications   predniSONE (DELTASONE) 20 MG tablet    Sig: Take 2 tablets (40 mg total) by mouth daily with breakfast.    Dispense:  8 tablet    Refill:  0   benzonatate (TESSALON PERLES) 100 MG capsule    Sig: 1-2 capsules twice daily as needed for cough.    Dispense:  30 capsule    Refill:  0     I hope you are feeling better soon!  Seek in person care promptly if your symptoms worsen, new concerns arise or you are not improving with treatment.  It was nice to meet you today. I help [facility] out with telemedicine visits on Tuesdays and Thursdays and am happy to help if you need a virtual follow up visit on those days. Otherwise, if you have any concerns or questions following this visit please schedule a follow up visit with your Primary Care office or seek care at a local urgent care clinic to avoid delays in care

## 2021-07-26 NOTE — Progress Notes (Signed)
Virtual Visit via Video Note  I connected with Colleen Washington  on 07/26/21 at  4:40 PM EST by a video enabled telemedicine application and verified that I am speaking with the correct person using two identifiers.  Location patient: [place] Location provider:work or home office Persons participating in the virtual visit: patient, provider  I discussed the limitations and requested verbal permission for telemedicine visit. The patient expressed understanding and agreed to proceed.   HPI:  Acute telemedicine visit for : -Onset:2 weeks ago -Symptoms include:cough, tickle in the throat, sneezing, coughing fit, nasal congestion -better except the hacking cough at night, clear mucus, alb helps minimally but doesn 't last long -Denies: fevers, CP, SOB, NVD -Has tried:musinex, advil, robitussin -Pertinent past medical history: see below, allergies, asthma -Pertinent medication allergies: Allergies  Allergen Reactions   Compazine [Prochlorperazine] Swelling  -COVID-19 vaccine status:  Immunization History  Administered Date(s) Administered   Influenza Nasal 04/06/2014   Influenza Split 04/06/2012   Influenza-Unspecified 04/08/2013, 04/23/2015, 04/07/2017, 03/15/2018, 07/18/2018, 05/15/2020, 05/11/2021   PFIZER(Purple Top)SARS-COV-2 Vaccination 07/16/2019, 08/07/2019, 08/29/2020   Tdap 02/13/2020     ROS: See pertinent positives and negatives per HPI.  Past Medical History:  Diagnosis Date   allergic rhinitis    Anxiety    AR (allergic rhinitis)    Asthma    Depression    Endometriosis    GERD (gastroesophageal reflux disease)    H/O migraine    Herniated intervertebral disc    History of diverticulitis of colon    Hyperlipidemia    Hypertension    IBS (irritable bowel syndrome)    Irritable bowel syndrome    Nonobstructive CAD (coronary artery disease)    a. 11/2019 Cor CTA: Cor Ca2+ = 141 (95th %'ile). LAD 25-49p.   Psoriasis    Tobacco abuse     Past Surgical History:  Procedure  Laterality Date   ABDOMINAL HYSTERECTOMY  2003   [facility]   BREAST BIOPSY Left 08/2014   benign-BENIGN BREAST TISSUE WITH FIBROSIS   DILATION AND CURETTAGE OF UTERUS       Current Outpatient Medications:    albuterol (VENTOLIN HFA) 108 (90 Base) MCG/ACT inhaler, INHALE 1-2 PUFFS INTO THE LUNGS EVERY 4 HOURS AS NEEDED FOR WHEEZING OR SHORTNESS OF BREATH., Disp: 18 each, Rfl: 2   aspirin EC 81 MG tablet, Take 1 tablet (81 mg total) by mouth daily. Swallow whole., Disp: , Rfl:    atorvastatin (LIPITOR) 40 MG tablet, Take 1 tablet (40 mg total) by mouth daily., Disp: 90 tablet, Rfl: 0   benzonatate (TESSALON PERLES) 100 MG capsule, 1-2 capsules twice daily as needed for cough., Disp: 30 capsule, Rfl: 0   buPROPion (WELLBUTRIN XL) 300 MG 24 hr tablet, TAKE 1 TABLET BY MOUTH EVERY DAY, Disp: 90 tablet, Rfl: 1   busPIRone (BUSPAR) 15 MG tablet, Take 1 tablet (15 mg total) by mouth 2 (two) times daily as needed., Disp: 180 tablet, Rfl: 2   Calcipotriene-Betameth Diprop (ENSTILAR) 0.005-0.064 % FOAM, APPLY TO AFFECTED AREA OF PALMS AND SUNS ONCE DAILY TO TWICE DAILY UNTIL IMPROVED. AVOID FACE, GROIN, AND UNDERARMS, Disp: 60 g, Rfl: 3   cetirizine (ZYRTEC) 10 MG tablet, Take 10 mg by mouth daily. Reported on 09/12/2015, Disp: , Rfl:    famotidine (PEPCID) 40 MG tablet, TAKE 1 TABLET BY MOUTH EVERYDAY AT BEDTIME, Disp: 90 tablet, Rfl: 1   fluticasone (FLONASE) 50 MCG/ACT nasal spray, USE 2 SPRAYS EACH NOSTRIL DAILY, Disp: 16 g, Rfl: 3   halobetasol (ULTRAVATE)  0.05 % cream, Apply together with tazarotene 0.1% cream 1-2 times a day as needed., Disp: 50 g, Rfl: 2   hydrOXYzine (VISTARIL) 25 MG capsule, Take 1 capsule (25 mg total) by mouth every 8 (eight) hours as needed., Disp: 60 capsule, Rfl: 0   Multiple Vitamins-Minerals (AIR[place]ORNE PO), Take by mouth., Disp: , Rfl:    Multiple Vitamins-Minerals (ALIVE MULTI-VITAMIN PO), Take by mouth., Disp: , Rfl:    pantoprazole (PROTONIX) 40 MG tablet, TAKE 1 TABLET BY  MOUTH EVERY DAY, Disp: 90 tablet, Rfl: 3   predniSONE (DELTASONE) 20 MG tablet, Take 2 tablets (40 mg total) by mouth daily with breakfast., Disp: 8 tablet, Rfl: 0   promethazine (PHENERGAN) 12.5 MG tablet, Take 1 tablet (12.5 mg total) by mouth every 4 (four) hours as needed for nausea or vomiting., Disp: 30 tablet, Rfl: 0   propranolol (INDERAL) 10 MG tablet, TAKE 1 TABLET (10 MG TOTAL) BY MOUTH 3 (THREE) TIMES DAILY. AS NEEDED FOR RAPID HEART RATE, Disp: 30 tablet, Rfl: 0   Risankizumab-rzaa (SKYRIZI PEN) 150 MG/ML SOAJ, Inject 150 mg into the skin as directed. Every 12 weeks for maintenance., Disp: 1 mL, Rfl: 2   spironolactone (ALDACTONE) 25 MG tablet, Take 1 tablet (25 mg total) by mouth daily., Disp: 90 tablet, Rfl: 3   Tapinarof (VTAMA) 1 % CREA, Apply 1 application topically daily. To affected areas psoriasis., Disp: 60 g, Rfl: 2   tazarotene (AVAGE) 0.1 % cream, APPLY TO HANDS AND FEET EVERY NIGHT AS NEEDED FOR PSORIASIS., Disp: 60 g, Rfl: 2  EXAM:  VITALS per patient if applicable:  GENERAL: alert, oriented, appears well and in no acute distress  HEENT: atraumatic, conjunttiva clear, no obvious abnormalities on inspection of external nose and ears  NECK: normal movements of the head and neck  LUNGS: on inspection no signs of respiratory distress, breathing rate appears normal, no obvious gross SOB, gasping or wheezing  CV: no obvious cyanosis  MS: moves all visible extremities without noticeable abnormality  PSYCH/NEURO: pleasant and cooperative, no obvious depression or anxiety, speech and thought processing grossly intact  ASSESSMENT AND PLAN:  Discussed the following assessment and plan:  Cough, unspecified type  -we discussed possible serious and likely etiologies, options for evaluation and workup, limitations of telemedicine visit vs in person visit, treatment, treatment risks and precautions. Pt is agreeable to treatment via telemedicine at this moment. Query viral  induces bronchitis vs other. She has opted to try prednisone and Tessalon rx for cough.  Advised to seek prompt virtual visit or in person care if worsening, new symptoms arise, or if is not improving with treatment as expected per our conversation of expected course. Discussed options for follow up care. Did let this patient know that I do telemedicine on Tuesdays and Thursdays for [facility] and those are the days I am logged into the system. Advised to schedule follow up visit with PCP, [place] virtual visits or [facility] if any further questions or concerns to avoid delays in care.   I discussed the assessment and treatment plan with the patient. The patient was provided an opportunity to ask questions and all were answered. The patient agreed with the plan and demonstrated an understanding of the instructions.     Lucretia Kern, DO

## 2021-07-28 ENCOUNTER — Other Ambulatory Visit: Payer: Self-pay | Admitting: Internal Medicine

## 2021-08-07 ENCOUNTER — Other Ambulatory Visit: Payer: Self-pay

## 2021-08-07 ENCOUNTER — Ambulatory Visit: Payer: BC Managed Care – PPO | Admitting: Dermatology

## 2021-08-07 DIAGNOSIS — Z79899 Other long term (current) drug therapy: Secondary | ICD-10-CM | POA: Diagnosis not present

## 2021-08-07 DIAGNOSIS — L0100 Impetigo, unspecified: Secondary | ICD-10-CM | POA: Diagnosis not present

## 2021-08-07 DIAGNOSIS — L403 Pustulosis palmaris et plantaris: Secondary | ICD-10-CM | POA: Diagnosis not present

## 2021-08-07 DIAGNOSIS — L814 Other melanin hyperpigmentation: Secondary | ICD-10-CM

## 2021-08-07 DIAGNOSIS — D1800 Hemangioma unspecified site: Secondary | ICD-10-CM

## 2021-08-07 DIAGNOSIS — L409 Psoriasis, unspecified: Secondary | ICD-10-CM | POA: Diagnosis not present

## 2021-08-07 DIAGNOSIS — D229 Melanocytic nevi, unspecified: Secondary | ICD-10-CM

## 2021-08-07 MED ORDER — MUPIROCIN 2 % EX OINT: 1.0000 "application " | TOPICAL_OINTMENT | Freq: Two times a day (BID) | CUTANEOUS | 0 refills | Status: AC

## 2021-08-07 NOTE — Patient Instructions (Signed)
Reviewed risks of biologics including immunosuppression, infections, injection site reaction, and failure to improve condition. Goal is control of skin condition, not cure.  Some older biologics such as Humira and Enbrel may slightly increase risk of malignancy and may worsen congestive heart failure. The use of biologics requires long term medication management, including periodic office visits and monitoring of blood work.  If You Need Anything After Your Visit  If you have any questions or concerns for your doctor, please call our main line at [phone] and press option 4 to reach your doctor's medical assistant. If no one answers, please leave a voicemail as directed and we will return your call as soon as possible. Messages left after 4 pm will be answered the following business day.   You may also send [place] a message via [place]. We typically respond to MyChart messages within 1-2 business days.  For prescription refills, please ask your pharmacy to contact our office. Our fax number is [phone].  If you have an urgent issue when the clinic is closed that cannot wait until the next business day, you can page your doctor at the number below.    Please note that while we do our best to be available for urgent issues outside of office hours, we are not available 24/7.   If you have an urgent issue and are unable to reach [place], you may choose to seek medical care at your doctor's office, retail clinic, urgent care center, or emergency room.  If you have a medical emergency, please immediately call 911 or go to the emergency department.  Pager Numbers  - Dr. Nehemiah Massed: [phone]  - Dr. Laurence Ferrari: [phone]  - Dr. Nicole Kindred: [phone]  In the event of inclement weather, please call our main line at [phone] for an update on the status of any delays or closures.  Dermatology Medication Tips: Please keep the boxes that topical medications come in in order to help keep track of the  instructions about where and how to use these. Pharmacies typically print the medication instructions only on the boxes and not directly on the medication tubes.   If your medication is too expensive, please contact our office at [phone] option 4 or send [place] a message through [place].   We are unable to tell what your co-pay for medications will be in advance as this is different depending on your insurance coverage. However, we may be able to find a substitute medication at lower cost or fill out paperwork to get insurance to cover a needed medication.   If a prior authorization is required to get your medication covered by your insurance company, please allow [place] 1-2 business days to complete this process.  Drug prices often vary depending on where the prescription is filled and some pharmacies may offer cheaper prices.  The website www.goodrx.com contains coupons for medications through different pharmacies. The prices here do not account for what the cost may be with help from insurance (it may be cheaper with your insurance), but the website can give you the price if you did not use any insurance.  - You can print the associated coupon and take it with your prescription to the pharmacy.  - You may also stop by our office during regular business hours and pick up a GoodRx coupon card.  - If you need your prescription sent electronically to a different pharmacy, notify our office through [facility] or by phone at [phone] option 4.     Si  Usted Necesita Algo Despus de Su Visita  Tambin puede enviarnos un mensaje a travs de Pharmacist, community. Por lo general respondemos a los mensajes de MyChart en el transcurso de 1 a 2 das hbiles.  Para renovar recetas, por favor pida a su farmacia que se ponga en contacto con nuestra oficina. Harland Dingwall de fax es [place] [phone].  Si tiene un asunto urgente cuando la clnica est cerrada y que no puede esperar hasta el siguiente da hbil,  puede llamar/localizar a su doctor(a) al nmero que aparece a continuacin.   Por favor, tenga en cuenta que aunque hacemos todo lo posible para estar disponibles para asuntos urgentes fuera del horario de [place], no estamos disponibles las 24 horas del da, los 7 das de la [place].   Si tiene un problema urgente y no puede comunicarse con nosotros, puede optar por buscar atencin mdica  en el consultorio de su doctor(a), en una clnica privada, en un centro de atencin urgente o en una sala de emergencias.  Si tiene Engineering geologist, por favor llame inmediatamente al 911 o vaya a la sala de emergencias.  Nmeros de bper  - Dr. Nehemiah Massed: [phone]  - Dra. Moye: [phone]  - Dra. Nicole Kindred: [phone]  En caso de inclemencias del [place], por favor llame a Johnsie Kindred principal al [phone] para una actualizacin sobre el [place] de cualquier retraso o cierre.  Consejos para la medicacin en dermatologa: Por favor, guarde las cajas en las que vienen los medicamentos de uso tpico para ayudarle a seguir las instrucciones sobre dnde y cmo usarlos. Las farmacias generalmente imprimen las instrucciones del medicamento slo en las cajas y no directamente en los tubos del [place].   Si su medicamento es muy caro, por favor, pngase en contacto con Zigmund Daniel llamando al [phone] y presione la opcin 4 o envenos un mensaje a travs de Pharmacist, community.   No podemos decirle cul ser su copago por los medicamentos por adelantado ya que esto es diferente dependiendo de la cobertura de su seguro. Sin embargo, es posible que podamos encontrar un medicamento sustituto a Electrical engineer un formulario para que el seguro cubra el medicamento que se considera necesario.   Si se requiere una autorizacin previa para que su compaa de seguros [place] su medicamento, por favor permtanos de 1 a 2 das hbiles para completar este proceso.  Los precios de los medicamentos varan con  frecuencia dependiendo del Environmental consultant de dnde se surte la receta y alguna farmacias pueden ofrecer precios ms baratos.  El sitio web www.goodrx.com tiene cupones para medicamentos de Airline pilot. Los precios aqu no tienen en cuenta lo que podra costar con la ayuda del seguro (puede ser ms barato con su seguro), pero el sitio web puede darle el precio si no utiliz Research scientist (physical sciences).  - Puede imprimir el cupn correspondiente y llevarlo con su receta a la farmacia.  - Tambin puede pasar por nuestra oficina durante el horario de atencin regular y Charity fundraiser una tarjeta de cupones de GoodRx.  - Si necesita que su receta se enve electrnicamente a una farmacia diferente, informe a nuestra oficina a travs de MyChart de [place] o por telfono llamando al [phone] y presione la opcin 4.

## 2021-08-07 NOTE — Progress Notes (Signed)
Follow-Up Visit   Subjective  Colleen Washington is a 61 y.o. female who presents for the following: Psoriasis (Patient here today for psoriasis follow up. Psoriasis at feet and hands well controlled on Skyrizi. She does use topicals as needed. ).  Patient switched to [facility] July 2022.  Patient has had upper respiratory symptoms for about 1 month and still has some coughing. She is due for her shot at home.   The following portions of the chart were reviewed this encounter and updated as appropriate:       Review of Systems:  No other skin or systemic complaints except as noted in HPI or Assessment and Plan.  Objective  Well appearing patient in no apparent distress; mood and affect are within normal limits.  A focused examination was performed including back, feet, hands. Relevant physical exam findings are noted in the Assessment and Plan.  feet, hands Mild erythema with focal hyperkeratosis at plantar foot and heel bilateral. Hands are clear.  Nose Crusts inside nostrils    Assessment & Plan  Psoriasis feet, hands  Palmarplantar- Improving on Skyrizi  Psoriasis - severe on systemic biologic treatment injections.  Psoriasis is a chronic non-curable, but treatable genetic/hereditary disease that may have other systemic features affecting other organ systems such as joints (Psoriatic Arthritis).  It is linked with heart disease, inflammatory bowel disease, non-alcoholic fatty liver disease, and depression. Significant skin psoriasis and/or psoriatic arthritis may have significant symptoms and affects activities of daily activity and often benefits from systemic biologic injection treatments.  These biologic treatments have some potential side effects including immunosuppression and require pre-treatment laboratory screening and periodic laboratory monitoring and periodic in person evaluation and monitoring by the attending dermatologist physician (long term medication  management).   Continue Skyrizi SQ Q12 weeks but wait for another week to do injection due to upper respiratory symptoms. Patient recently on prednisone and advised that psoriasis can worsen when coming off of oral steroids.   Start Vtama once daily. Was sent in at last visit but patient did not fill prescription.   Will check labs at next visit.   Related Medications halobetasol (ULTRAVATE) 0.05 % cream Apply together with tazarotene 0.1% cream 1-2 times a day as needed.  Calcipotriene-Betameth Diprop (ENSTILAR) 0.005-0.064 % FOAM APPLY TO AFFECTED AREA OF PALMS AND SUNS ONCE DAILY TO TWICE DAILY UNTIL IMPROVED. AVOID FACE, GROIN, AND UNDERARMS  Tapinarof (VTAMA) 1 % CREA Apply 1 application topically daily. To affected areas psoriasis.  tazarotene (AVAGE) 0.1 % cream APPLY TO HANDS AND FEET EVERY NIGHT AS NEEDED FOR PSORIASIS.  Impetigo Nose  Start mupirocin twice daily for 7 days.  Pt just started Navage nasal rinse- will continue  mupirocin ointment (BACTROBAN) 2 % - Nose Apply 1 application topically 2 (two) times daily for 7 days.  Lentigines - Scattered tan macules - Due to sun exposure - Benign-appering, observe - Recommend daily broad spectrum sunscreen SPF 30+ to sun-exposed areas, reapply every 2 hours as needed. - Call for any changes  Hemangiomas - Red papules - Discussed benign nature - Observe - Call for any changes  Melanocytic Nevi - Tan-brown and/or pink-flesh-colored symmetric macules and papules - Benign appearing on exam today - Observation - Call clinic for new or changing moles - Recommend daily use of broad spectrum spf 30+ sunscreen to sun-exposed areas.   Return in about 6 months (around 02/04/2022) for Psoriasis, TBSE.  Graciella Belton, RMA, am acting as scribe for Brendolyn Patty, MD .  Documentation: I have reviewed the above documentation for accuracy and completeness, and I agree with the above.  Brendolyn Patty MD

## 2021-08-15 ENCOUNTER — Encounter: Payer: Self-pay | Admitting: Internal Medicine

## 2021-08-15 DIAGNOSIS — M545 Low back pain, unspecified: Secondary | ICD-10-CM | POA: Diagnosis not present

## 2021-08-22 DIAGNOSIS — M955 Acquired deformity of pelvis: Secondary | ICD-10-CM | POA: Diagnosis not present

## 2021-08-22 DIAGNOSIS — M9903 Segmental and somatic dysfunction of lumbar region: Secondary | ICD-10-CM | POA: Diagnosis not present

## 2021-08-22 DIAGNOSIS — M9905 Segmental and somatic dysfunction of pelvic region: Secondary | ICD-10-CM | POA: Diagnosis not present

## 2021-08-22 DIAGNOSIS — M62830 Muscle spasm of back: Secondary | ICD-10-CM | POA: Diagnosis not present

## 2021-08-24 ENCOUNTER — Other Ambulatory Visit: Payer: Self-pay | Admitting: Internal Medicine

## 2021-08-27 DIAGNOSIS — M62830 Muscle spasm of back: Secondary | ICD-10-CM | POA: Diagnosis not present

## 2021-08-27 DIAGNOSIS — M955 Acquired deformity of pelvis: Secondary | ICD-10-CM | POA: Diagnosis not present

## 2021-08-27 DIAGNOSIS — M9903 Segmental and somatic dysfunction of lumbar region: Secondary | ICD-10-CM | POA: Diagnosis not present

## 2021-08-27 DIAGNOSIS — M9905 Segmental and somatic dysfunction of pelvic region: Secondary | ICD-10-CM | POA: Diagnosis not present

## 2021-08-29 DIAGNOSIS — M545 Low back pain, unspecified: Secondary | ICD-10-CM | POA: Diagnosis not present

## 2021-09-04 DIAGNOSIS — M9903 Segmental and somatic dysfunction of lumbar region: Secondary | ICD-10-CM | POA: Diagnosis not present

## 2021-09-04 DIAGNOSIS — M955 Acquired deformity of pelvis: Secondary | ICD-10-CM | POA: Diagnosis not present

## 2021-09-04 DIAGNOSIS — M9905 Segmental and somatic dysfunction of pelvic region: Secondary | ICD-10-CM | POA: Diagnosis not present

## 2021-09-04 DIAGNOSIS — M62830 Muscle spasm of back: Secondary | ICD-10-CM | POA: Diagnosis not present

## 2021-09-17 DIAGNOSIS — M9903 Segmental and somatic dysfunction of lumbar region: Secondary | ICD-10-CM | POA: Diagnosis not present

## 2021-09-17 DIAGNOSIS — M62830 Muscle spasm of back: Secondary | ICD-10-CM | POA: Diagnosis not present

## 2021-09-17 DIAGNOSIS — M955 Acquired deformity of pelvis: Secondary | ICD-10-CM | POA: Diagnosis not present

## 2021-09-17 DIAGNOSIS — M9905 Segmental and somatic dysfunction of pelvic region: Secondary | ICD-10-CM | POA: Diagnosis not present

## 2021-09-28 ENCOUNTER — Other Ambulatory Visit: Payer: Self-pay

## 2021-09-28 ENCOUNTER — Ambulatory Visit
Admission: RE | Admit: 2021-09-28 | Discharge: 2021-09-28 | Disposition: A | Discharge status: Home / Self Care | Payer: BC Managed Care – PPO | Source: Ambulatory Visit | Attending: Internal Medicine | Admitting: Internal Medicine

## 2021-09-28 DIAGNOSIS — Z1231 Encounter for screening mammogram for malignant neoplasm of breast: Secondary | ICD-10-CM | POA: Diagnosis not present

## 2021-10-02 DIAGNOSIS — M9903 Segmental and somatic dysfunction of lumbar region: Secondary | ICD-10-CM | POA: Diagnosis not present

## 2021-10-02 DIAGNOSIS — M955 Acquired deformity of pelvis: Secondary | ICD-10-CM | POA: Diagnosis not present

## 2021-10-02 DIAGNOSIS — M9905 Segmental and somatic dysfunction of pelvic region: Secondary | ICD-10-CM | POA: Diagnosis not present

## 2021-10-02 DIAGNOSIS — M62830 Muscle spasm of back: Secondary | ICD-10-CM | POA: Diagnosis not present

## 2021-10-16 DIAGNOSIS — M9903 Segmental and somatic dysfunction of lumbar region: Secondary | ICD-10-CM | POA: Diagnosis not present

## 2021-10-16 DIAGNOSIS — M955 Acquired deformity of pelvis: Secondary | ICD-10-CM | POA: Diagnosis not present

## 2021-10-16 DIAGNOSIS — M62830 Muscle spasm of back: Secondary | ICD-10-CM | POA: Diagnosis not present

## 2021-10-16 DIAGNOSIS — M9905 Segmental and somatic dysfunction of pelvic region: Secondary | ICD-10-CM | POA: Diagnosis not present

## 2021-10-30 DIAGNOSIS — M9905 Segmental and somatic dysfunction of pelvic region: Secondary | ICD-10-CM | POA: Diagnosis not present

## 2021-10-30 DIAGNOSIS — M62830 Muscle spasm of back: Secondary | ICD-10-CM | POA: Diagnosis not present

## 2021-10-30 DIAGNOSIS — M955 Acquired deformity of pelvis: Secondary | ICD-10-CM | POA: Diagnosis not present

## 2021-10-30 DIAGNOSIS — M9903 Segmental and somatic dysfunction of lumbar region: Secondary | ICD-10-CM | POA: Diagnosis not present

## 2021-10-31 DIAGNOSIS — J0140 Acute pansinusitis, unspecified: Secondary | ICD-10-CM | POA: Diagnosis not present

## 2021-10-31 DIAGNOSIS — H6692 Otitis media, unspecified, left ear: Secondary | ICD-10-CM | POA: Diagnosis not present

## 2021-11-26 DIAGNOSIS — R053 Chronic cough: Secondary | ICD-10-CM | POA: Diagnosis not present

## 2021-11-28 ENCOUNTER — Telehealth (INDEPENDENT_AMBULATORY_CARE_PROVIDER_SITE_OTHER): Payer: BC Managed Care – PPO | Admitting: Internal Medicine

## 2021-11-28 ENCOUNTER — Encounter: Payer: Self-pay | Admitting: Internal Medicine

## 2021-11-28 DIAGNOSIS — J440 Chronic obstructive pulmonary disease with acute lower respiratory infection: Secondary | ICD-10-CM | POA: Diagnosis not present

## 2021-11-28 DIAGNOSIS — J209 Acute bronchitis, unspecified: Secondary | ICD-10-CM

## 2021-11-28 MED ORDER — AZITHROMYCIN 500 MG PO TABS: 500.0000 mg | ORAL_TABLET | Freq: Every day | ORAL | 0 refills | Status: DC

## 2021-11-28 MED ORDER — PREDNISONE 10 MG PO TABS: ORAL_TABLET | ORAL | 0 refills | Status: DC

## 2021-11-28 MED ORDER — CHERATUSSIN AC 100-10 MG/5ML PO SOLN: 5.0000 mL | Freq: Three times a day (TID) | ORAL | 0 refills | Status: DC | PRN

## 2021-11-28 MED ORDER — FLUTICASONE-SALMETEROL 100-50 MCG/ACT IN AEPB: 1.0000 | INHALATION_SPRAY | Freq: Two times a day (BID) | RESPIRATORY_TRACT | 0 refills | Status: DC

## 2021-11-28 NOTE — Progress Notes (Signed)
Virtual Visit via [place] Note  This visit type was conducted due to national recommendations for restrictions regarding the COVID-19 pandemic (e.g. social distancing).  This format is felt to be most appropriate for this patient at this time.  All issues noted in this document were discussed and addressed.  No physical exam was performed (except for noted visual exam findings with Video Visits).   I connected withNAME@ on 11/28/21 at  4:45 PM EDT by a video enabled telemedicine application and verified that I am speaking with the correct person using two identifiers. Location patient: home Location provider: work or home office Persons participating in the virtual visit: patient, provider  I discussed the limitations, risks, security and privacy concerns of performing an evaluation and management service by telephone and the availability of in person appointments. I also discussed with the patient that there may be a patient responsible charge related to this service. The patient expressed understanding and agreed to proceed.   Reason for visit: bronchitis  HPI:  61 yr old female with history of chronic bronchitis and ongoing  tobacco abuse presents with one week history of persistent cough.  Cough has become paroxysmal and resulting in gagging.  Some chest tightness/wheezing  using albuterol MDI.  Has tried all oTC meds without improvement.  Not sleeping due to cough   No fevers , no sinus pain , body aches.  Covid negative x 2. Granddaughter was sick with same symptoms during a recent trip [place] together.    ROS: See pertinent positives and negatives per HPI.  Past Medical History:  Diagnosis Date   allergic rhinitis    Anxiety    AR (allergic rhinitis)    Asthma    Depression    Endometriosis    GERD (gastroesophageal reflux disease)    H/O migraine    Herniated intervertebral disc    History of diverticulitis of colon    Hyperlipidemia    Hypertension    IBS  (irritable bowel syndrome)    Irritable bowel syndrome    Nonobstructive CAD (coronary artery disease)    a. 11/2019 Cor CTA: Cor Ca2+ = 141 (95th %'ile). LAD 25-49p.   Psoriasis    Tobacco abuse     Past Surgical History:  Procedure Laterality Date   ABDOMINAL HYSTERECTOMY  2003   tvhuso   BREAST BIOPSY Left 08/2014   benign-BENIGN BREAST TISSUE WITH FIBROSIS   DILATION AND CURETTAGE OF UTERUS      Family History  Problem Relation Age of Onset   Arthritis Mother    Hyperlipidemia Mother    Cancer Father        colon   Diabetes Brother    Breast cancer Paternal Aunt 93   Arthritis Maternal Grandmother    Hyperlipidemia Maternal Grandfather    Heart disease Neg Hx    Colon cancer Neg Hx     SOCIAL HX:  reports that she quit smoking about 2 weeks ago. Her smoking use included cigarettes. She has a 5.00 pack-year smoking history. She has never used smokeless tobacco. She reports current alcohol use. She reports that she does not use drugs.    Current Outpatient Medications:    albuterol (VENTOLIN HFA) 108 (90 Base) MCG/ACT inhaler, INHALE 1-2 PUFFS INTO THE LUNGS EVERY 4 HOURS AS NEEDED FOR WHEEZING OR SHORTNESS OF BREATH., Disp: 18 each, Rfl: 2   aspirin EC 81 MG tablet, Take 1 tablet (81 mg total) by mouth daily. Swallow whole., Disp: , Rfl:  atorvastatin (LIPITOR) 40 MG tablet, TAKE 1 TABLET BY MOUTH EVERY DAY, Disp: 90 tablet, Rfl: 2   azithromycin (ZITHROMAX) 500 MG tablet, Take 1 tablet (500 mg total) by mouth daily., Disp: 7 tablet, Rfl: 0   buPROPion (WELLBUTRIN XL) 300 MG 24 hr tablet, TAKE 1 TABLET BY MOUTH EVERY DAY, Disp: 90 tablet, Rfl: 1   busPIRone (BUSPAR) 15 MG tablet, TAKE 1 TABLET (15 MG TOTAL) BY MOUTH 2 (TWO) TIMES DAILY AS NEEDED., Disp: 180 tablet, Rfl: 2   Calcipotriene-Betameth Diprop (ENSTILAR) 0.005-0.064 % FOAM, APPLY TO AFFECTED AREA OF PALMS AND SUNS ONCE DAILY TO TWICE DAILY UNTIL IMPROVED. AVOID FACE, GROIN, AND UNDERARMS, Disp: 60 g, Rfl: 3    cetirizine (ZYRTEC) 10 MG tablet, Take 10 mg by mouth daily. Reported on 09/12/2015, Disp: , Rfl:    famotidine (PEPCID) 40 MG tablet, TAKE 1 TABLET BY MOUTH EVERYDAY AT BEDTIME, Disp: 90 tablet, Rfl: 1   fluticasone (FLONASE) 50 MCG/ACT nasal spray, USE 2 SPRAYS EACH NOSTRIL DAILY, Disp: 16 g, Rfl: 3   fluticasone-salmeterol (ADVAIR) 100-50 MCG/ACT AEPB, Inhale 1 puff into the lungs 2 (two) times daily., Disp: 60 each, Rfl: 0   guaiFENesin-codeine (CHERATUSSIN AC) 100-10 MG/5ML syrup, Take 5 mLs by mouth 3 (three) times daily as needed for cough., Disp: 180 mL, Rfl: 0   halobetasol (ULTRAVATE) 0.05 % cream, Apply together with tazarotene 0.1% cream 1-2 times a day as needed., Disp: 50 g, Rfl: 2   hydrOXYzine (VISTARIL) 25 MG capsule, Take 1 capsule (25 mg total) by mouth every 8 (eight) hours as needed., Disp: 60 capsule, Rfl: 0   Multiple Vitamins-Minerals (AIRBORNE PO), Take by mouth., Disp: , Rfl:    Multiple Vitamins-Minerals (ALIVE MULTI-VITAMIN PO), Take by mouth., Disp: , Rfl:    pantoprazole (PROTONIX) 40 MG tablet, TAKE 1 TABLET BY MOUTH EVERY DAY, Disp: 90 tablet, Rfl: 3   predniSONE (DELTASONE) 10 MG tablet, 6 tablets on Day 1 , then reduce by 1 tablet daily until gone, Disp: 21 tablet, Rfl: 0   promethazine (PHENERGAN) 12.5 MG tablet, Take 1 tablet (12.5 mg total) by mouth every 4 (four) hours as needed for nausea or vomiting., Disp: 30 tablet, Rfl: 0   propranolol (INDERAL) 10 MG tablet, TAKE 1 TABLET (10 MG TOTAL) BY MOUTH 3 (THREE) TIMES DAILY. AS NEEDED FOR RAPID HEART RATE, Disp: 30 tablet, Rfl: 0   Risankizumab-rzaa (SKYRIZI PEN) 150 MG/ML SOAJ, Inject 150 mg into the skin as directed. Every 12 weeks for maintenance., Disp: 1 mL, Rfl: 2   spironolactone (ALDACTONE) 25 MG tablet, TAKE 1 TABLET (25 MG TOTAL) BY MOUTH DAILY., Disp: 90 tablet, Rfl: 3   Tapinarof (VTAMA) 1 % CREA, Apply 1 application topically daily. To affected areas psoriasis., Disp: 60 g, Rfl: 2   tazarotene (AVAGE)  0.1 % cream, APPLY TO HANDS AND FEET EVERY NIGHT AS NEEDED FOR PSORIASIS., Disp: 60 g, Rfl: 2   molnupiravir EUA (LAGEVRIO) 200 mg CAPS capsule, Take 4 capsules (800 mg total) by mouth 2 (two) times daily for 5 days., Disp: 40 capsule, Rfl: 0  EXAM:  VITALS per patient if applicable:  GENERAL: alert, oriented, appears well and in no acute distress  HEENT: atraumatic, conjunttiva clear, no obvious abnormalities on inspection of external nose and ears  NECK: normal movements of the head and neck  LUNGS: on inspection no signs of respiratory distress, breathing rate appears normal, no obvious gross SOB, gasping or wheezing  CV: no obvious cyanosis  MS:  moves all visible extremities without noticeable abnormality  PSYCH/NEURO: pleasant and cooperative, no obvious depression or anxiety, speech and thought processing grossly intact  ASSESSMENT AND PLAN:  Discussed the following assessment and plan:  Acute bronchitis with COPD (HCC)  Acute bronchitis with COPD (HCC) prednisone taper, cheratussin  and azithromycin  prescribed along with a probiotic .    molnupirovir added 24 hours later for positive COVID test     I discussed the assessment and treatment plan with the patient. The patient was provided an opportunity to ask questions and all were answered. The patient agreed with the plan and demonstrated an understanding of the instructions.   The patient was advised to call back or seek an in-person evaluation if the symptoms worsen or if the condition fails to improve as anticipated.   I spent 20 minutes dedicated to the care of this patient on the date of this encounter to include pre-visit review of her medical history,  Face-to-face time with the patient , and post visit ordering of testing and therapeutics.    Crecencio Mc, MD

## 2021-11-29 ENCOUNTER — Encounter: Payer: Self-pay | Admitting: Internal Medicine

## 2021-11-29 MED ORDER — MOLNUPIRAVIR EUA 200MG CAPSULE: 4.0000 | ORAL_CAPSULE | Freq: Two times a day (BID) | ORAL | 0 refills | Status: DC

## 2021-12-01 NOTE — Assessment & Plan Note (Signed)
prednisone taper, cheratussin  and azithromycin  prescribed along with a probiotic .    molnupirovir added 24 hours later for positive COVID test

## 2021-12-13 ENCOUNTER — Other Ambulatory Visit: Payer: Self-pay | Admitting: Dermatology

## 2021-12-13 DIAGNOSIS — L409 Psoriasis, unspecified: Secondary | ICD-10-CM

## 2021-12-18 ENCOUNTER — Telehealth (INDEPENDENT_AMBULATORY_CARE_PROVIDER_SITE_OTHER): Payer: BC Managed Care – PPO | Admitting: Internal Medicine

## 2021-12-18 ENCOUNTER — Encounter: Payer: Self-pay | Admitting: Internal Medicine

## 2021-12-18 VITALS — Ht 67.5 in | Wt 193.0 lb

## 2021-12-18 DIAGNOSIS — R7301 Impaired fasting glucose: Secondary | ICD-10-CM

## 2021-12-18 DIAGNOSIS — E559 Vitamin D deficiency, unspecified: Secondary | ICD-10-CM

## 2021-12-18 DIAGNOSIS — K2100 Gastro-esophageal reflux disease with esophagitis, without bleeding: Secondary | ICD-10-CM

## 2021-12-18 DIAGNOSIS — I10 Essential (primary) hypertension: Secondary | ICD-10-CM

## 2021-12-18 DIAGNOSIS — E7800 Pure hypercholesterolemia, unspecified: Secondary | ICD-10-CM | POA: Diagnosis not present

## 2021-12-18 DIAGNOSIS — K449 Diaphragmatic hernia without obstruction or gangrene: Secondary | ICD-10-CM | POA: Insufficient documentation

## 2021-12-18 MED ORDER — PANTOPRAZOLE SODIUM 40 MG PO TBEC: 40.0000 mg | DELAYED_RELEASE_TABLET | Freq: Two times a day (BID) | ORAL | 2 refills | Status: DC

## 2021-12-18 NOTE — Assessment & Plan Note (Signed)
Per patient, found on previous EGD done prior to 2011 .  Reviewed lifestyle changes recommended to prevent esophagitis from [facility]

## 2021-12-18 NOTE — Telephone Encounter (Signed)
Pt was scheduled for today at 11:30.

## 2021-12-18 NOTE — Assessment & Plan Note (Signed)
recent symptoms are uncontrolled .  Advised to increase protonix twice daily and use a wedge pillow .  If no improvement  Refer to GI

## 2021-12-18 NOTE — Progress Notes (Signed)
Virtual Visit via [place] Note  This visit type was conducted due to national recommendations for restrictions regarding the COVID-19 pandemic (e.g. social distancing).  This format is felt to be most appropriate for this patient at this time.  All issues noted in this document were discussed and addressed.  No physical exam was performed (except for noted visual exam findings with Video Visits).   I connected withNAME@ on 12/18/21 at 11:30 AM EDT by a video enabled telemedicine application and verified that I am speaking with the correct person using two identifiers. Location patient: home Location provider: work or home office Persons participating in the virtual visit: patient, provider  I discussed the limitations, risks, security and privacy concerns of performing an evaluation and management service by telephone and the availability of in person appointments. I also discussed with the patient that there may be a patient responsible charge related to this service. The patient expressed understanding and agreed to proceed.  Reason for visit: esophageal reflux    HPI:  61 yr old female with history of GERD managed with daily pantoprazole and famotidine  presents with persistent symptoms described as  a burning sensation accompanied by bitter taste in mouth that wakes her up at night.  Also occurring  during the day but not as often.  She has been taking dual therapy:  she takes protonix in the morning once she is at work,  with water  (prior to drinking her coffee)  and takes famotidine 40 mg before  bedtime   Eats dinner by 6:30 pm.  Stays Active at night doing chores. Rarely lies on the couch.  She does not snack at night.  Symptoms occurring during sleep , not related to certain foods.  Has increased her water intake, and stopped using flavor packets (sugar free) .  She does not sleep on the wedge .  Has a small HH by prior EGD  done over 11 years ago. Allen Norris).     ROS: See pertinent  positives and negatives per HPI.  Past Medical History:  Diagnosis Date   allergic rhinitis    Anxiety    AR (allergic rhinitis)    Asthma    Depression    Endometriosis    GERD (gastroesophageal reflux disease)    H/O migraine    Herniated intervertebral disc    History of diverticulitis of colon    Hyperlipidemia    Hypertension    IBS (irritable bowel syndrome)    Irritable bowel syndrome    Nonobstructive CAD (coronary artery disease)    a. 11/2019 Cor CTA: Cor Ca2+ = 141 (95th %'ile). LAD 25-49p.   Psoriasis    Tobacco abuse     Past Surgical History:  Procedure Laterality Date   ABDOMINAL HYSTERECTOMY  2003   tvhuso   BREAST BIOPSY Left 08/2014   benign-BENIGN BREAST TISSUE WITH FIBROSIS   DILATION AND CURETTAGE OF UTERUS      Family History  Problem Relation Age of Onset   Arthritis Mother    Hyperlipidemia Mother    Cancer Father        colon   Diabetes Brother    Breast cancer Paternal Aunt 107   Arthritis Maternal Grandmother    Hyperlipidemia Maternal Grandfather    Heart disease Neg Hx    Colon cancer Neg Hx     SOCIAL HX:  reports that she quit smoking about 4 weeks ago. Her smoking use included cigarettes. She has a 5.00 pack-year smoking history. She  has never used smokeless tobacco. She reports current alcohol use. She reports that she does not use drugs.    Current Outpatient Medications:    albuterol (VENTOLIN HFA) 108 (90 Base) MCG/ACT inhaler, INHALE 1-2 PUFFS INTO THE LUNGS EVERY 4 HOURS AS NEEDED FOR WHEEZING OR SHORTNESS OF BREATH., Disp: 18 each, Rfl: 2   aspirin EC 81 MG tablet, Take 1 tablet (81 mg total) by mouth daily. Swallow whole., Disp: , Rfl:    atorvastatin (LIPITOR) 40 MG tablet, TAKE 1 TABLET BY MOUTH EVERY DAY, Disp: 90 tablet, Rfl: 2   buPROPion (WELLBUTRIN XL) 300 MG 24 hr tablet, TAKE 1 TABLET BY MOUTH EVERY DAY, Disp: 90 tablet, Rfl: 1   busPIRone (BUSPAR) 15 MG tablet, TAKE 1 TABLET (15 MG TOTAL) BY MOUTH 2 (TWO) TIMES  DAILY AS NEEDED., Disp: 180 tablet, Rfl: 2   Calcipotriene-Betameth Diprop (ENSTILAR) 0.005-0.064 % FOAM, APPLY TO AFFECTED AREA OF PALMS AND SUNS ONCE DAILY TO TWICE DAILY UNTIL IMPROVED. AVOID FACE, GROIN, AND UNDERARMS, Disp: 60 g, Rfl: 3   cetirizine (ZYRTEC) 10 MG tablet, Take 10 mg by mouth daily. Reported on 09/12/2015, Disp: , Rfl:    famotidine (PEPCID) 40 MG tablet, TAKE 1 TABLET BY MOUTH EVERYDAY AT BEDTIME, Disp: 90 tablet, Rfl: 1   fluticasone (FLONASE) 50 MCG/ACT nasal spray, USE 2 SPRAYS EACH NOSTRIL DAILY, Disp: 16 g, Rfl: 3   fluticasone-salmeterol (ADVAIR) 100-50 MCG/ACT AEPB, Inhale 1 puff into the lungs 2 (two) times daily., Disp: 60 each, Rfl: 0   halobetasol (ULTRAVATE) 0.05 % cream, Apply together with tazarotene 0.1% cream 1-2 times a day as needed., Disp: 50 g, Rfl: 2   hydrOXYzine (VISTARIL) 25 MG capsule, Take 1 capsule (25 mg total) by mouth every 8 (eight) hours as needed., Disp: 60 capsule, Rfl: 0   Multiple Vitamins-Minerals (AIRBORNE PO), Take by mouth., Disp: , Rfl:    Multiple Vitamins-Minerals (ALIVE MULTI-VITAMIN PO), Take by mouth., Disp: , Rfl:    promethazine (PHENERGAN) 12.5 MG tablet, Take 1 tablet (12.5 mg total) by mouth every 4 (four) hours as needed for nausea or vomiting., Disp: 30 tablet, Rfl: 0   propranolol (INDERAL) 10 MG tablet, TAKE 1 TABLET (10 MG TOTAL) BY MOUTH 3 (THREE) TIMES DAILY. AS NEEDED FOR RAPID HEART RATE, Disp: 30 tablet, Rfl: 0   Risankizumab-rzaa (SKYRIZI PEN) 150 MG/ML SOAJ, Inject 150 mg into the skin as directed. Every 12 weeks for maintenance., Disp: 1 mL, Rfl: 2   Tapinarof (VTAMA) 1 % CREA, Apply 1 application topically daily. To affected areas psoriasis., Disp: 60 g, Rfl: 2   tazarotene (AVAGE) 0.1 % cream, APPLY TO HANDS AND FEET EVERY NIGHT AS NEEDED FOR PSORIASIS., Disp: 60 g, Rfl: 2   pantoprazole (PROTONIX) 40 MG tablet, Take 1 tablet (40 mg total) by mouth 2 (two) times daily. 30 minutes before meals, Disp: 60 tablet, Rfl:  2  EXAM:  VITALS per patient if applicable:  GENERAL: alert, oriented, appears well and in no acute distress  HEENT: atraumatic, conjunttiva clear, no obvious abnormalities on inspection of external nose and ears  NECK: normal movements of the head and neck  LUNGS: on inspection no signs of respiratory distress, breathing rate appears normal, no obvious gross SOB, gasping or wheezing  CV: no obvious cyanosis  MS: moves all visible extremities without noticeable abnormality  PSYCH/NEURO: pleasant and cooperative, no obvious depression or anxiety, speech and thought processing grossly intact  ASSESSMENT AND PLAN:  Discussed the following assessment and  plan:  Essential hypertension - Plan: Comprehensive metabolic panel  Pure hypercholesterolemia - Plan: Lipid panel  Vitamin D deficiency  Impaired fasting glucose - Plan: Hemoglobin A1c  Gastroesophageal reflux disease with esophagitis without hemorrhage  Esophageal hiatal hernia  GERD (gastroesophageal reflux disease) recent symptoms are uncontrolled .  Advised to increase protonix twice daily and use a wedge pillow .  If no improvement  Refer to GI   Esophageal hiatal hernia Per patient, found on previous EGD done prior to 2011 .  Reviewed lifestyle changes recommended to prevent esophagitis from [facility]     I discussed the assessment and treatment plan with the patient. The patient was provided an opportunity to ask questions and all were answered. The patient agreed with the plan and demonstrated an understanding of the instructions.   The patient was advised to call back or seek an in-person evaluation if the symptoms worsen or if the condition fails to improve as anticipated.   I spent 20 minutes dedicated to the care of this patient on the date of this encounter to include pre-visit review of her medical history,  Face-to-face time with the patient , and post visit ordering of testing and therapeutics.    Crecencio Mc, MD

## 2021-12-18 NOTE — Patient Instructions (Signed)
STop the famotidine  Increase the pantoprazole to 2 times daily ON AN EMPTY STOMCH (30 MINTUES PRIOR,  OR 2 HOURS AFTER A MEAL)  YOU NEED TO ELEVATE YOUR TORSO WHEN SLEEPING USING A WEDGE PILLOW (AMAZON)  You are overdue for labs  Stop the spironolactone

## 2021-12-19 ENCOUNTER — Telehealth: Payer: Self-pay | Admitting: Internal Medicine

## 2021-12-19 NOTE — Telephone Encounter (Signed)
Please change lab orders to [facility]. Thank you.

## 2021-12-19 NOTE — Telephone Encounter (Signed)
Labs have been changed and pt is aware.

## 2021-12-19 NOTE — Addendum Note (Signed)
Addended by: Adair Laundry on: 12/19/2021 05:42 PM   Modules accepted: Orders

## 2021-12-20 ENCOUNTER — Other Ambulatory Visit
Admission: RE | Admit: 2021-12-20 | Discharge: 2021-12-20 | Disposition: A | Discharge status: Home / Self Care | Payer: BC Managed Care – PPO | Attending: Internal Medicine | Admitting: Internal Medicine

## 2021-12-20 DIAGNOSIS — I10 Essential (primary) hypertension: Secondary | ICD-10-CM | POA: Insufficient documentation

## 2021-12-20 DIAGNOSIS — E7800 Pure hypercholesterolemia, unspecified: Secondary | ICD-10-CM | POA: Insufficient documentation

## 2021-12-20 DIAGNOSIS — R7301 Impaired fasting glucose: Secondary | ICD-10-CM | POA: Diagnosis not present

## 2021-12-20 LAB — HEMOGLOBIN A1C
Hgb A1c MFr Bld: 5.5 % (ref 4.8–5.6)
Mean Plasma Glucose: 111.15 mg/dL

## 2021-12-20 LAB — COMPREHENSIVE METABOLIC PANEL
ALT: 18 U/L (ref 0–44)
AST: 20 U/L (ref 15–41)
Albumin: 4 g/dL (ref 3.5–5.0)
Alkaline Phosphatase: 58 U/L (ref 38–126)
Anion gap: 4 — ABNORMAL LOW (ref 5–15)
BUN: 13 mg/dL (ref 6–20)
CO2: 25 mmol/L (ref 22–32)
Calcium: 9.1 mg/dL (ref 8.9–10.3)
Chloride: 112 mmol/L — ABNORMAL HIGH (ref 98–111)
Creatinine, Ser: 0.8 mg/dL (ref 0.44–1.00)
GFR, Estimated: >60 mL/min (ref >60)
Glucose, Bld: 109 mg/dL — ABNORMAL HIGH (ref 70–99)
Potassium: 3.8 mmol/L (ref 3.5–5.1)
Sodium: 141 mmol/L (ref 135–145)
Total Bilirubin: 0.3 mg/dL (ref 0.3–1.2)
Total Protein: 7.4 g/dL (ref 6.5–8.1)

## 2021-12-20 LAB — LIPID PANEL
Cholesterol: 181 mg/dL (ref 0–200)
HDL: 54 mg/dL (ref >40)
LDL Cholesterol: 104 mg/dL — ABNORMAL HIGH (ref 0–99)
Total CHOL/HDL Ratio: 3.4 RATIO
Triglycerides: 115 mg/dL (ref <150)
VLDL: 23 mg/dL (ref 0–40)

## 2021-12-24 ENCOUNTER — Other Ambulatory Visit: Payer: Self-pay | Admitting: Internal Medicine

## 2022-01-08 DIAGNOSIS — M62830 Muscle spasm of back: Secondary | ICD-10-CM | POA: Diagnosis not present

## 2022-01-08 DIAGNOSIS — M9903 Segmental and somatic dysfunction of lumbar region: Secondary | ICD-10-CM | POA: Diagnosis not present

## 2022-01-08 DIAGNOSIS — M9905 Segmental and somatic dysfunction of pelvic region: Secondary | ICD-10-CM | POA: Diagnosis not present

## 2022-01-08 DIAGNOSIS — M955 Acquired deformity of pelvis: Secondary | ICD-10-CM | POA: Diagnosis not present

## 2022-01-11 ENCOUNTER — Encounter: Payer: Self-pay | Admitting: Internal Medicine

## 2022-01-13 ENCOUNTER — Other Ambulatory Visit: Payer: Self-pay | Admitting: Internal Medicine

## 2022-02-01 ENCOUNTER — Other Ambulatory Visit: Payer: Self-pay | Admitting: Dermatology

## 2022-02-04 ENCOUNTER — Ambulatory Visit: Payer: BC Managed Care – PPO | Admitting: Dermatology

## 2022-02-04 DIAGNOSIS — D2372 Other benign neoplasm of skin of left lower limb, including hip: Secondary | ICD-10-CM

## 2022-02-04 DIAGNOSIS — D229 Melanocytic nevi, unspecified: Secondary | ICD-10-CM

## 2022-02-04 DIAGNOSIS — D2271 Melanocytic nevi of right lower limb, including hip: Secondary | ICD-10-CM

## 2022-02-04 DIAGNOSIS — Z1283 Encounter for screening for malignant neoplasm of skin: Secondary | ICD-10-CM | POA: Diagnosis not present

## 2022-02-04 DIAGNOSIS — D1800 Hemangioma unspecified site: Secondary | ICD-10-CM

## 2022-02-04 DIAGNOSIS — L814 Other melanin hyperpigmentation: Secondary | ICD-10-CM

## 2022-02-04 DIAGNOSIS — Z79899 Other long term (current) drug therapy: Secondary | ICD-10-CM | POA: Diagnosis not present

## 2022-02-04 DIAGNOSIS — L738 Other specified follicular disorders: Secondary | ICD-10-CM

## 2022-02-04 DIAGNOSIS — L409 Psoriasis, unspecified: Secondary | ICD-10-CM | POA: Diagnosis not present

## 2022-02-04 DIAGNOSIS — L578 Other skin changes due to chronic exposure to nonionizing radiation: Secondary | ICD-10-CM

## 2022-02-04 DIAGNOSIS — D239 Other benign neoplasm of skin, unspecified: Secondary | ICD-10-CM

## 2022-02-04 DIAGNOSIS — L821 Other seborrheic keratosis: Secondary | ICD-10-CM

## 2022-02-04 MED ORDER — ENSTILAR 0.005-0.064 % EX FOAM: CUTANEOUS | 6 refills | Status: DC

## 2022-02-04 MED ORDER — TAZAROTENE 0.1 % EX CREA: TOPICAL_CREAM | CUTANEOUS | 6 refills | Status: DC

## 2022-02-04 NOTE — Progress Notes (Signed)
Follow-Up Visit   Subjective  Colleen Washington is a 61 y.o. female who presents for the following: Total body skin exam, Psoriasis (Feet, hands, 68mf/u, Skyrizi sq injections q 12 wks, started ~ 02/2021, Enstilar foam qd, Tarazotene cr 1x/wk), and check spots (Face, ~310m No side effects [place] very satisfied with results- working well.  The patient presents for Total-Body Skin Exam (TBSE) for skin cancer screening and mole check.  The patient has spots, moles and lesions to be evaluated, some may be new or changing and the patient has concerns that these could be cancer.   The following portions of the chart were reviewed this encounter and updated as appropriate:       Review of Systems:  No other skin or systemic complaints except as noted in HPI or Assessment and Plan.  Objective  Well appearing patient in no apparent distress; mood and affect are within normal limits.  A full examination was performed including scalp, head, eyes, ears, nose, lips, neck, chest, axillae, abdomen, back, buttocks, bilateral upper extremities, bilateral lower extremities, hands, feet, fingers, toes, fingernails, and toenails. All findings within normal limits unless otherwise noted below.  hands, feet Mild erythema and scale R palm, bil plantar feet, L elbow  R foot dorsum  R foot dorsum 2.51m56med brown macule  L post ankle Firm pink/brown papulenodule with dimple sign.     Assessment & Plan   Lentigines - Scattered tan macules - Due to sun exposure - Benign-appearing, observe - Recommend daily broad spectrum sunscreen SPF 30+ to sun-exposed areas, reapply every 2 hours as needed. - Call for any changes - back  Melanocytic Nevi - Tan-brown and/or pink-flesh-colored symmetric macules and papules - Benign appearing on exam today - Observation - Call clinic for new or changing moles - Recommend daily use of broad spectrum spf 30+ sunscreen to sun-exposed areas.  -  back  Hemangiomas - Red papules - Discussed benign nature - Observe - Call for any changes - back, abdomen  Actinic Damage - Chronic condition, secondary to cumulative UV/sun exposure - diffuse scaly erythematous macules with underlying dyspigmentation - Recommend daily broad spectrum sunscreen SPF 30+ to sun-exposed areas, reapply every 2 hours as needed.  - Staying in the shade or wearing long sleeves, sun glasses (UVA+UVB protection) and wide brim hats (4-inch brim around the entire circumference of the hat) are also recommended for sun protection.  - Call for new or changing lesions. - chest  Skin cancer screening performed today.  Sebaceous Hyperplasia - Small yellow papules with a central dell - Benign - Observe  - glabella, nose  Psoriasis hands, feet  Chronic condition with duration or expected duration over one year. Currently well-controlled on Skyrizi  Psoriasis - severe on systemic "biologic" treatment injections.  Psoriasis is a chronic non-curable, but treatable genetic/hereditary disease that may have other systemic features affecting other organ systems such as joints (Psoriatic Arthritis).  It is linked with heart disease, inflammatory bowel disease, non-alcoholic fatty liver disease, and depression. Significant skin psoriasis and/or psoriatic arthritis may have significant symptoms and affects activities of daily activity and often benefits from systemic "biologic" injection treatments.  These "biologic" treatments have some potential side effects including immunosuppression and require pre-treatment laboratory screening and periodic laboratory monitoring and periodic in person evaluation and monitoring by the attending dermatologist physician (long term medication management).   Recent labs reviewed, needs annual TB test Cont Skyrizi sq injections q 12 wks Cont Tazarotene  0.1% cr 1x/wk Cont Enstilar foam qd aa hands, feet, elbows Restart Amlactin cream qd (not  at same time as Dispensing optician)  Calcipotriene-Betameth Diprop (ENSTILAR) 0.005-0.064 % FOAM - hands, feet Qd to aa hands, feet, elbows until clear, then prn flares  tazarotene (AVAGE) 0.1 % cream - hands, feet Qhs to hands and feet prn psoriasis flares  QuantiFERON-TB Gold Plus - hands, feet  Nevus R foot dorsum  Benign-appearing.  Observation.  Call clinic for new or changing lesions.  Recommend daily use of broad spectrum spf 30+ sunscreen to sun-exposed areas.    Dermatofibroma L post ankle  A dermatofibroma is a benign growth possibly related to trauma, such as an insect bite or inflamed acne-type bump.  Discussed removal (shave vrs excision) with resulting scar and risk of recurrence.  Since not bothersome, will observe for now.  Benign, observe   Return in about 6 months (around 08/07/2022) for Psoriasis f/u.  I, Othelia Pulling, RMA, am acting as scribe for Brendolyn Patty, MD .  Documentation: I have reviewed the above documentation for accuracy and completeness, and I agree with the above.  Brendolyn Patty MD

## 2022-02-04 NOTE — Patient Instructions (Addendum)
Recommend starting moisturizer with exfoliant (Urea, Salicylic acid, or Lactic acid) one to two times daily to help smooth rough and bumpy skin.  OTC options include Cetaphil Rough and Bumpy lotion (Urea), Eucerin Roughness Relief lotion or spot treatment cream (Urea), CeraVe SA lotion/cream for Rough and Bumpy skin (Sal Acid), Gold Bond Rough and Bumpy cream (Sal Acid), and AmLactin 12% lotion/cream (Lactic Acid).  If applying in morning, also apply sunscreen to sun-exposed areas, since these exfoliating moisturizers can increase sensitivity to sun.   Don't use at same time as using Enstilar foam      Due to recent changes in healthcare laws, you may see results of your pathology and/or laboratory studies on MyChart before the doctors have had a chance to review them. We understand that in some cases there may be results that are confusing or concerning to you. Please understand that not all results are received at the same time and often the doctors may need to interpret multiple results in order to provide you with the best plan of care or course of treatment. Therefore, we ask that you please give [place] 2 business days to thoroughly review all your results before contacting the office for clarification. Should we see a critical lab result, you will be contacted sooner.   If You Need Anything After Your Visit  If you have any questions or concerns for your doctor, please call our main line at [phone] and press option 4 to reach your doctor's medical assistant. If no one answers, please leave a voicemail as directed and we will return your call as soon as possible. Messages left after 4 pm will be answered the following business day.   You may also send [place] a message via [place]. We typically respond to MyChart messages within 1-2 business days.  For prescription refills, please ask your pharmacy to contact our office. Our fax number is [phone].  If you have an urgent issue when the clinic  is closed that cannot wait until the next business day, you can page your doctor at the number below.    Please note that while we do our best to be available for urgent issues outside of office hours, we are not available 24/7.   If you have an urgent issue and are unable to reach [place], you may choose to seek medical care at your doctor's office, retail clinic, urgent care center, or emergency room.  If you have a medical emergency, please immediately call 911 or go to the emergency department.  Pager Numbers  - Dr. Nehemiah Massed: [phone]  - Dr. Laurence Ferrari: [phone]  - Dr. Nicole Kindred: [phone]  In the event of inclement weather, please call our main line at [phone] for an update on the status of any delays or closures.  Dermatology Medication Tips: Please keep the boxes that topical medications come in in order to help keep track of the instructions about where and how to use these. Pharmacies typically print the medication instructions only on the boxes and not directly on the medication tubes.   If your medication is too expensive, please contact our office at [phone] option 4 or send [place] a message through [place].   We are unable to tell what your co-pay for medications will be in advance as this is different depending on your insurance coverage. However, we may be able to find a substitute medication at lower cost or fill out paperwork to get insurance to cover a needed medication.   If a prior  authorization is required to get your medication covered by your insurance company, please allow [place] 1-2 business days to complete this process.  Drug prices often vary depending on where the prescription is filled and some pharmacies may offer cheaper prices.  The website www.goodrx.com contains coupons for medications through different pharmacies. The prices here do not account for what the cost may be with help from insurance (it may be cheaper with your insurance), but the website  can give you the price if you did not use any insurance.  - You can print the associated coupon and take it with your prescription to the pharmacy.  - You may also stop by our office during regular business hours and pick up a GoodRx coupon card.  - If you need your prescription sent electronically to a different pharmacy, notify our office through [facility] or by phone at [phone] option 4.     Si Usted Necesita Algo Despus de Su Visita  Tambin puede enviarnos un mensaje a travs de Pharmacist, community. Por lo general respondemos a los mensajes de MyChart en el transcurso de 1 a 2 das hbiles.  Para renovar recetas, por favor pida a su farmacia que se ponga en contacto con nuestra oficina. Harland Dingwall de fax es [place] [phone].  Si tiene un asunto urgente cuando la clnica est cerrada y que no puede esperar hasta el siguiente da hbil, puede llamar/localizar a su doctor(a) al nmero que aparece a continuacin.   Por favor, tenga en cuenta que aunque hacemos todo lo posible para estar disponibles para asuntos urgentes fuera del horario de [place], no estamos disponibles las 24 horas del da, los 7 das de la [place].   Si tiene un problema urgente y no puede comunicarse con nosotros, puede optar por buscar atencin mdica  en el consultorio de su doctor(a), en una clnica privada, en un centro de atencin urgente o en una sala de emergencias.  Si tiene Engineering geologist, por favor llame inmediatamente al 911 o vaya a la sala de emergencias.  Nmeros de bper  - Dr. Nehemiah Massed: [phone]  - Dra. Moye: [phone]  - Dra. Nicole Kindred: [phone]  En caso de inclemencias del [place], por favor llame a Johnsie Kindred principal al [phone] para una actualizacin sobre el [place] de cualquier retraso o cierre.  Consejos para la medicacin en dermatologa: Por favor, guarde las cajas en las que vienen los medicamentos de uso tpico para ayudarle a seguir las instrucciones  sobre dnde y cmo usarlos. Las farmacias generalmente imprimen las instrucciones del medicamento slo en las cajas y no directamente en los tubos del [place].   Si su medicamento es muy caro, por favor, pngase en contacto con Zigmund Daniel llamando al [phone] y presione la opcin 4 o envenos un mensaje a travs de Pharmacist, community.   No podemos decirle cul ser su copago por los medicamentos por adelantado ya que esto es diferente dependiendo de la cobertura de su seguro. Sin embargo, es posible que podamos encontrar un medicamento sustituto a Electrical engineer un formulario para que el seguro cubra el medicamento que se considera necesario.   Si se requiere una autorizacin previa para que su compaa de seguros [place] su medicamento, por favor permtanos de 1 a 2 das hbiles para completar este proceso.  Los precios de los medicamentos varan con frecuencia dependiendo del Environmental consultant de dnde se surte la receta y alguna farmacias pueden ofrecer precios ms baratos.  El sitio web www.goodrx.com tiene cupones para  para medicamentos de diferentes farmacias. Los precios aqu no tienen en cuenta lo que podra costar con la ayuda del seguro (puede ser ms barato con su seguro), pero el sitio web puede darle el precio si no utiliz ningn seguro.  - Puede imprimir el cupn correspondiente y llevarlo con su receta a la farmacia.  - Tambin puede pasar por nuestra oficina durante el horario de atencin regular y recoger una tarjeta de cupones de GoodRx.  - Si necesita que su receta se enve electrnicamente a una farmacia diferente, informe a nuestra oficina a travs de MyChart de [place] o por telfono llamando al [phone] y presione la opcin 4.  

## 2022-02-12 DIAGNOSIS — M9903 Segmental and somatic dysfunction of lumbar region: Secondary | ICD-10-CM | POA: Diagnosis not present

## 2022-02-12 DIAGNOSIS — M62830 Muscle spasm of back: Secondary | ICD-10-CM | POA: Diagnosis not present

## 2022-02-12 DIAGNOSIS — M9905 Segmental and somatic dysfunction of pelvic region: Secondary | ICD-10-CM | POA: Diagnosis not present

## 2022-02-12 DIAGNOSIS — M955 Acquired deformity of pelvis: Secondary | ICD-10-CM | POA: Diagnosis not present

## 2022-02-19 ENCOUNTER — Other Ambulatory Visit: Payer: Self-pay

## 2022-02-19 MED ORDER — SKYRIZI PEN 150 MG/ML ~~LOC~~ SOAJ: 150.0000 mg | SUBCUTANEOUS | 2 refills | Status: DC

## 2022-02-19 NOTE — Telephone Encounter (Signed)
Pt called requesting a refill of Skyrizi injection, okay Skyrizi sent to [facility]

## 2022-03-12 DIAGNOSIS — M955 Acquired deformity of pelvis: Secondary | ICD-10-CM | POA: Diagnosis not present

## 2022-03-12 DIAGNOSIS — M9905 Segmental and somatic dysfunction of pelvic region: Secondary | ICD-10-CM | POA: Diagnosis not present

## 2022-03-12 DIAGNOSIS — M9903 Segmental and somatic dysfunction of lumbar region: Secondary | ICD-10-CM | POA: Diagnosis not present

## 2022-03-12 DIAGNOSIS — M62830 Muscle spasm of back: Secondary | ICD-10-CM | POA: Diagnosis not present

## 2022-04-03 DIAGNOSIS — M955 Acquired deformity of pelvis: Secondary | ICD-10-CM | POA: Diagnosis not present

## 2022-04-03 DIAGNOSIS — M9905 Segmental and somatic dysfunction of pelvic region: Secondary | ICD-10-CM | POA: Diagnosis not present

## 2022-04-03 DIAGNOSIS — M9903 Segmental and somatic dysfunction of lumbar region: Secondary | ICD-10-CM | POA: Diagnosis not present

## 2022-04-03 DIAGNOSIS — M62830 Muscle spasm of back: Secondary | ICD-10-CM | POA: Diagnosis not present

## 2022-05-08 DIAGNOSIS — M62830 Muscle spasm of back: Secondary | ICD-10-CM | POA: Diagnosis not present

## 2022-05-08 DIAGNOSIS — M9905 Segmental and somatic dysfunction of pelvic region: Secondary | ICD-10-CM | POA: Diagnosis not present

## 2022-05-08 DIAGNOSIS — M955 Acquired deformity of pelvis: Secondary | ICD-10-CM | POA: Diagnosis not present

## 2022-05-08 DIAGNOSIS — M9903 Segmental and somatic dysfunction of lumbar region: Secondary | ICD-10-CM | POA: Diagnosis not present

## 2022-05-15 ENCOUNTER — Other Ambulatory Visit: Payer: Self-pay | Admitting: Internal Medicine

## 2022-05-23 DIAGNOSIS — M9905 Segmental and somatic dysfunction of pelvic region: Secondary | ICD-10-CM | POA: Diagnosis not present

## 2022-05-23 DIAGNOSIS — M955 Acquired deformity of pelvis: Secondary | ICD-10-CM | POA: Diagnosis not present

## 2022-05-23 DIAGNOSIS — M9903 Segmental and somatic dysfunction of lumbar region: Secondary | ICD-10-CM | POA: Diagnosis not present

## 2022-05-23 DIAGNOSIS — M62830 Muscle spasm of back: Secondary | ICD-10-CM | POA: Diagnosis not present

## 2022-05-26 ENCOUNTER — Other Ambulatory Visit: Payer: Self-pay | Admitting: Internal Medicine

## 2022-05-27 NOTE — Telephone Encounter (Signed)
Refilled: 07/04/2021 Last OV: 12/18/2021 Next OV: not scheduled

## 2022-06-04 DIAGNOSIS — M62830 Muscle spasm of back: Secondary | ICD-10-CM | POA: Diagnosis not present

## 2022-06-04 DIAGNOSIS — M9903 Segmental and somatic dysfunction of lumbar region: Secondary | ICD-10-CM | POA: Diagnosis not present

## 2022-06-04 DIAGNOSIS — M9905 Segmental and somatic dysfunction of pelvic region: Secondary | ICD-10-CM | POA: Diagnosis not present

## 2022-06-04 DIAGNOSIS — M955 Acquired deformity of pelvis: Secondary | ICD-10-CM | POA: Diagnosis not present

## 2022-07-05 DIAGNOSIS — L409 Psoriasis, unspecified: Secondary | ICD-10-CM | POA: Diagnosis not present

## 2022-07-07 ENCOUNTER — Other Ambulatory Visit: Payer: Self-pay | Admitting: Internal Medicine

## 2022-07-11 LAB — QUANTIFERON-TB GOLD PLUS
QuantiFERON Mitogen Value: >10 IU/mL
QuantiFERON Nil Value: 0.05 IU/mL
QuantiFERON TB1 Ag Value: 0.14 IU/mL
QuantiFERON TB2 Ag Value: 0.1 IU/mL
QuantiFERON-TB Gold Plus: NEGATIVE

## 2022-07-16 ENCOUNTER — Telehealth: Payer: Self-pay

## 2022-07-16 NOTE — Telephone Encounter (Signed)
Patient advised TB negative, continue Skyrizi. Keep appt next month. Lurlean Horns., RMA

## 2022-07-16 NOTE — Telephone Encounter (Signed)
-----   Message from Brendolyn Patty, MD sent at 07/11/2022  9:27 AM EST ----- TB negative, continue Skyrizi, pt has appt in Feb.  Can send in rf if needed before appt  - please call patient

## 2022-07-16 NOTE — Telephone Encounter (Signed)
Left pt msg to call for lab result/sh

## 2022-07-17 DIAGNOSIS — M9905 Segmental and somatic dysfunction of pelvic region: Secondary | ICD-10-CM | POA: Diagnosis not present

## 2022-07-17 DIAGNOSIS — M9903 Segmental and somatic dysfunction of lumbar region: Secondary | ICD-10-CM | POA: Diagnosis not present

## 2022-07-17 DIAGNOSIS — M62830 Muscle spasm of back: Secondary | ICD-10-CM | POA: Diagnosis not present

## 2022-07-17 DIAGNOSIS — M955 Acquired deformity of pelvis: Secondary | ICD-10-CM | POA: Diagnosis not present

## 2022-07-19 ENCOUNTER — Encounter: Payer: Self-pay | Admitting: Obstetrics and Gynecology

## 2022-07-19 ENCOUNTER — Ambulatory Visit (INDEPENDENT_AMBULATORY_CARE_PROVIDER_SITE_OTHER): Payer: BC Managed Care – PPO | Admitting: Obstetrics and Gynecology

## 2022-07-19 VITALS — BP 141/82 | HR 88 | Ht 67.5 in | Wt 201.2 lb

## 2022-07-19 DIAGNOSIS — Z01419 Encounter for gynecological examination (general) (routine) without abnormal findings: Secondary | ICD-10-CM

## 2022-07-19 DIAGNOSIS — R351 Nocturia: Secondary | ICD-10-CM

## 2022-07-19 DIAGNOSIS — N951 Menopausal and female climacteric states: Secondary | ICD-10-CM

## 2022-07-19 DIAGNOSIS — Z1231 Encounter for screening mammogram for malignant neoplasm of breast: Secondary | ICD-10-CM

## 2022-07-19 MED ORDER — ESTRADIOL 1 MG PO TABS: 1.0000 mg | ORAL_TABLET | Freq: Every day | ORAL | 0 refills | Status: DC

## 2022-07-19 NOTE — Progress Notes (Signed)
Patients presents for annual exam today. She states still having trouble with night time incontinence, wearing a pad at night for protection. Patient also reports experiencing hot flashes, night sweats, moodiness and trouble concentrating at work. History of hysterectomy. She is due for mammogram in March, ordered. Annual labs are ordered. Patient states no other questions or concerns at this time.

## 2022-07-19 NOTE — Progress Notes (Signed)
HPI:      [name] is a 62 y.o. [id number] who LMP was No LMP recorded. Patient has had a hysterectomy.  Subjective:   She presents today for her annual examination.  She states that she continues to have problems with leaking urine at night but she has developed a system and is using pads and does not want surgery or anything done about it.  She has settled in and feels fine about it. A new issue for her is that she has begun to have significant hot flashes during the day and night sweats.  With a prior hysterectomy she is unsure when she went into menopause but believes it happened " within the last year because that is when my symptoms started".     Hx: The following portions of the patient's history were reviewed and updated as appropriate:             She  has a past medical history of allergic rhinitis, Anxiety, AR (allergic rhinitis), Asthma, Depression, Endometriosis, GERD (gastroesophageal reflux disease), H/O migraine, Herniated intervertebral disc, History of diverticulitis of colon, Hyperlipidemia, Hypertension, IBS (irritable bowel syndrome), Irritable bowel syndrome, Nonobstructive CAD (coronary artery disease), Psoriasis, and Tobacco abuse. She does not have any pertinent problems on file. She  has a past surgical history that includes Abdominal hysterectomy (2003); Dilation and curettage of uterus; and Breast biopsy (Left, 08/2014). Her family history includes Arthritis in her maternal grandmother and mother; Breast cancer (age of onset: 35) in her paternal aunt; Cancer in her father; Diabetes in her brother; Hyperlipidemia in her maternal grandfather and mother. She  reports that she quit smoking about 8 months ago. Her smoking use included cigarettes. She has a 5.00 pack-year smoking history. She has never used smokeless tobacco. She reports current alcohol use. She reports that she does not use drugs. She has a current medication list which includes the following  prescription(s): albuterol, aspirin ec, atorvastatin, bupropion, buspirone, enstilar, cetirizine, estradiol, fluticasone, fluticasone-salmeterol, hydroxyzine, multiple vitamins-minerals, multiple vitamins-minerals, pantoprazole, promethazine, propranolol, skyrizi pen, and tazarotene. She is allergic to compazine [prochlorperazine].       Review of Systems:  Review of Systems  Constitutional: Denied constitutional symptoms, night sweats, recent illness, fatigue, fever, insomnia and weight loss.  Eyes: Denied eye symptoms, eye pain, photophobia, vision change and visual disturbance.  Ears/Nose/Throat/Neck: Denied ear, nose, throat or neck symptoms, hearing loss, nasal discharge, sinus congestion and sore throat.  Cardiovascular: Denied cardiovascular symptoms, arrhythmia, chest pain/pressure, edema, exercise intolerance, orthopnea and palpitations.  Respiratory: Denied pulmonary symptoms, asthma, pleuritic pain, productive sputum, cough, dyspnea and wheezing.  Gastrointestinal: Denied, gastro-esophageal reflux, melena, nausea and vomiting.  Genitourinary: See HPI for additional information.  Musculoskeletal: Denied musculoskeletal symptoms, stiffness, swelling, muscle weakness and myalgia.  Dermatologic: Denied dermatology symptoms, rash and scar.  Neurologic: Denied neurology symptoms, dizziness, headache, neck pain and syncope.  Psychiatric: Denied psychiatric symptoms, anxiety and depression.  Endocrine: See HPI for additional information.   Meds:   Current Outpatient Medications on File Prior to Visit  Medication Sig Dispense Refill   albuterol (VENTOLIN HFA) 108 (90 Base) MCG/ACT inhaler INHALE 1-2 PUFFS INTO THE LUNGS EVERY 4 HOURS AS NEEDED FOR WHEEZING OR SHORTNESS OF BREATH. 18 each 2   aspirin EC 81 MG tablet Take 1 tablet (81 mg total) by mouth daily. Swallow whole.     atorvastatin (LIPITOR) 40 MG tablet TAKE 1 TABLET BY MOUTH EVERY DAY 90 tablet 2   buPROPion (WELLBUTRIN XL) 300  MG 24 hr tablet TAKE 1 TABLET BY MOUTH EVERY DAY 90 tablet 1   busPIRone (BUSPAR) 15 MG tablet TAKE 1 TABLET (15 MG TOTAL) BY MOUTH 2 (TWO) TIMES DAILY AS NEEDED. 180 tablet 2   Calcipotriene-Betameth Diprop (ENSTILAR) 0.005-0.064 % FOAM Qd to aa hands, feet, elbows until clear, then prn flares 60 g 6   cetirizine (ZYRTEC) 10 MG tablet Take 10 mg by mouth daily. Reported on 09/12/2015     fluticasone (FLONASE) 50 MCG/ACT nasal spray USE 2 SPRAYS EACH NOSTRIL DAILY 16 g 3   fluticasone-salmeterol (ADVAIR) 100-50 MCG/ACT AEPB Inhale 1 puff into the lungs 2 (two) times daily. 60 each 0   hydrOXYzine (VISTARIL) 25 MG capsule Take 1 capsule (25 mg total) by mouth every 8 (eight) hours as needed. 60 capsule 0   Multiple Vitamins-Minerals (AIRBORNE PO) Take by mouth.     Multiple Vitamins-Minerals (ALIVE MULTI-VITAMIN PO) Take by mouth.     pantoprazole (PROTONIX) 40 MG tablet TAKE 1 TABLET (40 MG TOTAL) BY MOUTH 2 (TWO) TIMES DAILY. 30 MINUTES BEFORE MEALS 180 tablet 0   promethazine (PHENERGAN) 12.5 MG tablet TAKE 1 TABLET (12.5 MG TOTAL) BY MOUTH EVERY 4 HOURS AS NEEDED FOR NAUSEA AND VOMITING 30 tablet 0   propranolol (INDERAL) 10 MG tablet TAKE 1 TABLET (10 MG TOTAL) BY MOUTH 3 (THREE) TIMES DAILY. AS NEEDED FOR RAPID HEART RATE 30 tablet 0   Risankizumab-rzaa (SKYRIZI PEN) 150 MG/ML SOAJ Inject 150 mg into the skin as directed. Every 12 weeks for maintenance. 1 mL 2   tazarotene (AVAGE) 0.1 % cream Qhs to hands and feet prn psoriasis flares 60 g 6   No current facility-administered medications on file prior to visit.     Objective:     Vitals:   07/19/22 0904  BP: (!) 141/82  Pulse: 88    Filed Weights   07/19/22 0904  Weight: 201 lb 3.2 oz (91.3 kg)        Physical examination General NAD, Conversant  HEENT Atraumatic; Op clear with mmm.  Normo-cephalic. Pupils reactive. Anicteric sclerae  Thyroid/Neck Smooth without nodularity or enlargement. Normal ROM.  Neck Supple.  Skin No  rashes, lesions or ulceration. Normal palpated skin turgor. No nodularity.  Breasts: No masses or discharge.  Symmetric.  No axillary adenopathy.  Lungs: Clear to auscultation.No rales or wheezes. Normal Respiratory effort, no retractions.  Heart: NSR.  No murmurs or rubs appreciated. No periferal edema  Abdomen: Soft.  Non-tender.  No masses.  No HSM. No hernia  Extremities: Moves all appropriately.  Normal ROM for age. No lymphadenopathy.  Neuro: Oriented to PPT.  Normal mood. Normal affect.     Pelvic:   Vulva: Normal appearance.  No lesions.   Vagina: No lesions or abnormalities noted.  Atrophy noted  Support: Second-degree cystocele  Urethra No masses tenderness or scarring.  Meatus Normal size without lesions or prolapse.  Cervix: Surgically absent   Anus: Normal exam.  No lesions.  Perineum: Normal exam.  No lesions.        Bimanual   Uterus: Surgically absent   Adnexae: No masses.  Non-tender to palpation.  Cul-de-sac: Negative for abnormality.     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   SVT (supraventricular tachycardia) 08/15/2018   Acute bronchitis with COPD ([place]) 10/21/2017   Thyroid nodule 10/28/2016   Ovarian cyst, right 09/30/2015   Endometriosis 08/10/2015   Status post vaginal hysterectomy 08/10/2015   Anxiety 08/10/2015   Obesity (BMI 30.0-34.9) 08/10/2015   Vitamin D deficiency 05/26/2015   Weight gain 02/22/2015    Menopause 09/02/2014   Visit for preventive health examination 08/24/2013   Snoring 08/24/2013   Symptoms, such as flushing, sleeplessness, headache, lack of concentration, associated with the menopause 08/24/2013   Encounter for drug screening 08/13/2013   Migraine headache 08/26/2012   Panic disorder without agoraphobia with mild panic attacks 08/26/2012   Bronchitis, chronic ([place]) 05/09/2012   History of diverticulitis of colon    GERD (gastroesophageal reflux disease)    Hyperlipidemia    Irritable bowel syndrome    Essential hypertension    Herniated intervertebral disc    Tobacco use 03/04/2012     1. Well woman exam with routine gynecological exam   2. Screening mammogram for breast cancer   3. Nocturia   4. Symptomatic menopausal or female climacteric states     Patient with symptomatic menopause.  She says that her night sweats and hot flashes are sometimes disabling-interfering with her day.  She reports her nocturia is stable and she does not want anything done about it.   Plan:            1.  Basic Screening Recommendations The basic screening recommendations for asymptomatic women were discussed with the patient during her visit.  The age-appropriate recommendations were discussed with her and the rational for the tests reviewed.  When I am informed by the patient that another primary care physician has previously obtained the age-appropriate tests and they are up-to-date, only outstanding tests are ordered and referrals given as necessary.  Abnormal results of tests will be discussed with her when all of her results are completed.  Routine preventative health maintenance measures emphasized: Exercise/Diet/Weight control, Tobacco Warnings, Alcohol/Substance use risks and Stress Management  Blood work ordered HRT I have discussed HRT with the patient in detail.  The risk/benefits of it were reviewed.  She understands that during menopause Estrogen decreases dramatically  and that this results in an increased risk of cardiovascular disease as well as osteoporosis.  We have also discussed the fact that hot flashes often result from a decrease in Estrogen, and that by replacing Estrogen, they can often be alleviated.  We have discussed skin, vaginal and urinary tract changes that may also take place from this drop in Estrogen.  Emotional changes have also been linked to Estrogen and we have briefly discussed this.  The benefits of HRT including decrease in hot flashes, vaginal dryness, and osteoporosis were discussed.  The emotional benefit and a possible change in her cardiovascular risk profile was also reviewed.  The risks associated with Hormone Replacement Therapy were also reviewed.  The use of unopposed Estrogen and its relationship to endometrial cancer was discussed.  The addition of Progesterone and its beneficial effect on endometrial cancer was also noted.  The fact that there has been no consistent definitive studies showing an increase in breast cancer in women who use HRT was discussed with the  patient.  The possible side effects including breast tenderness, fluid retention, mood changes and vaginal bleeding were discussed.  The patient was informed that this is an elective medication and that she may choose not to take Hormone Replacement Therapy.  Literature on HRT was made available, and I believe that after answering all of the patient's questions.  Special emphasis on the WHI study, as well as several studies since that pertaining to the risks and benefits of estrogen replacement therapy were compared.  The possible limitations of these studies were discussed including the age stratification of the WHI study.  The possible increased role of Progesterone in these studies was discussed in detail.  Different types of hormone formulation and methods of taking hormone replacement therapy were discussed. Literature on HRT was made available, and I believe that after  answering all of the patient's questions she has an adequate and informed understanding of the risks and benefits of HRT. She would like to begin pills -will start Estrace Orders Orders Placed This Encounter  Procedures   MM DIGITAL SCREENING BILATERAL   Basic metabolic panel   CBC   TSH   Lipid panel   Hemoglobin A1c     Meds ordered this encounter  Medications   estradiol (ESTRACE) 1 MG tablet    Sig: Take 1 tablet (1 mg total) by mouth daily.    Dispense:  90 tablet    Refill:  0         F/U  Return in about 6 weeks (around 08/30/2022).  Finis Bud, M.D. 07/19/2022 9:44 AM

## 2022-07-20 LAB — LIPID PANEL
Chol/HDL Ratio: 4 ratio (ref 0.0–4.4)
Cholesterol, Total: 221 mg/dL — ABNORMAL HIGH (ref 100–199)
HDL: 55 mg/dL (ref >39)
LDL Chol Calc (NIH): 144 mg/dL — ABNORMAL HIGH (ref 0–99)
Triglycerides: 124 mg/dL (ref 0–149)
VLDL Cholesterol Cal: 22 mg/dL (ref 5–40)

## 2022-07-20 LAB — CBC
Hematocrit: 41.2 % (ref 34.0–46.6)
Hemoglobin: 13.8 g/dL (ref 11.1–15.9)
MCH: 29.3 pg (ref 26.6–33.0)
MCHC: 33.5 g/dL (ref 31.5–35.7)
MCV: 88 fL (ref 79–97)
Platelets: 187 10*3/uL (ref 150–450)
RBC: 4.71 x10E6/uL (ref 3.77–5.28)
RDW: 12.7 % (ref 11.7–15.4)
WBC: 4.8 10*3/uL (ref 3.4–10.8)

## 2022-07-20 LAB — BASIC METABOLIC PANEL
BUN/Creatinine Ratio: 16 (ref 12–28)
BUN: 13 mg/dL (ref 8–27)
CO2: 22 mmol/L (ref 20–29)
Calcium: 9.3 mg/dL (ref 8.7–10.3)
Chloride: 104 mmol/L (ref 96–106)
Creatinine, Ser: 0.83 mg/dL (ref 0.57–1.00)
Glucose: 89 mg/dL (ref 70–99)
Potassium: 4.5 mmol/L (ref 3.5–5.2)
Sodium: 141 mmol/L (ref 134–144)
eGFR: 81 mL/min/{1.73_m2} (ref >59)

## 2022-07-20 LAB — TSH: TSH: 1.95 u[IU]/mL (ref 0.450–4.500)

## 2022-07-20 LAB — HEMOGLOBIN A1C
Est. average glucose Bld gHb Est-mCnc: 114 mg/dL
Hgb A1c MFr Bld: 5.6 % (ref 4.8–5.6)

## 2022-07-20 NOTE — Progress Notes (Signed)
Colleen Washington: Please let her know about her elevated cholesterol.   Low cholesterol diet with FU labs in 3 months  Vs FU with primary care for possible statin. Thanks

## 2022-07-22 ENCOUNTER — Encounter: Payer: Self-pay | Admitting: Internal Medicine

## 2022-07-29 NOTE — Progress Notes (Signed)
Follow-up Outpatient Visit Date: 07/31/2022  Primary Care Provider: Crecencio Mc, MD [address] [place] [address]  Chief Complaint: Palpitations and hot flashes  HPI:  Ms. Buffalo is a 62 y.o. female with history of nonobstructive coronary artery disease, hypertension, hyperlipidemia, irritable bowel syndrome, diverticulitis, endometriosis, depression, anxiety, and GERD, who presents for follow-up of coronary artery disease and hyperlipidemia.  I last saw her in 05/2021, at which time she was feeling well.  We agreed to add aspirin 81 mg daily and continue atorvastatin 40 mg daily to prevent progression of disease.  Today, Ms. Pecha reports that she began experiencing "hot flashes" couple months ago.  She would sometimes wake up hot and sweaty and at other times feel very cold.  She was prescribed estradiol by her gynecologist but did not tolerate this medication.  She also notes occasional palpitations over the last 6 months during which it feels like her heart is racing.  Episodes usually last about 20 minutes and seem to coincide with anxiety.  Episodes usually stop after she tries to calm herself down.  She also tried taking propranolol once with rapid improvement in the palpitations.  She sometimes will take diphenhydramine to see if that helps, though she only intermittently notices improvement in the palpitations with this.  She has not had any chest pain, shortness of breath, or lightheadedness.  She had lost 12 pounds with a strict diet but unfortunately regained this weight.  She is planning to start exercising more and change her diet again in an effort to lose weight again.  She is also working on quitting smoking.  --------------------------------------------------------------------------------------------------  Past Medical History:  Diagnosis Date   allergic rhinitis    Anxiety    AR (allergic rhinitis)    Asthma    Depression    Endometriosis    GERD  (gastroesophageal reflux disease)    H/O migraine    Herniated intervertebral disc    History of diverticulitis of colon    Hyperlipidemia    Hypertension    IBS (irritable bowel syndrome)    Irritable bowel syndrome    Nonobstructive CAD (coronary artery disease)    a. 11/2019 Cor CTA: Cor Ca2+ = 141 (95th %'ile). LAD 25-49p.   Psoriasis    Tobacco abuse    Past Surgical History:  Procedure Laterality Date   ABDOMINAL HYSTERECTOMY  2003   [id number]   BREAST BIOPSY Left 08/2014   benign-BENIGN BREAST TISSUE WITH FIBROSIS   DILATION AND CURETTAGE OF UTERUS      Current Meds  Medication Sig   albuterol (VENTOLIN HFA) 108 (90 Base) MCG/ACT inhaler INHALE 1-2 PUFFS INTO THE LUNGS EVERY 4 HOURS AS NEEDED FOR WHEEZING OR SHORTNESS OF BREATH.   aspirin EC 81 MG tablet Take 1 tablet (81 mg total) by mouth daily. Swallow whole.   atorvastatin (LIPITOR) 40 MG tablet TAKE 1 TABLET BY MOUTH EVERY DAY   buPROPion (WELLBUTRIN XL) 300 MG 24 hr tablet TAKE 1 TABLET BY MOUTH EVERY DAY   busPIRone (BUSPAR) 15 MG tablet TAKE 1 TABLET (15 MG TOTAL) BY MOUTH 2 (TWO) TIMES DAILY AS NEEDED.   Calcipotriene-Betameth Diprop (ENSTILAR) 0.005-0.064 % FOAM Qd to aa hands, feet, elbows until clear, then prn flares   cetirizine (ZYRTEC) 10 MG tablet Take 10 mg by mouth daily. Reported on 09/12/2015   fluticasone (FLONASE) 50 MCG/ACT nasal spray USE 2 SPRAYS EACH NOSTRIL DAILY   hydrOXYzine (VISTARIL) 25 MG capsule Take 1 capsule (25  mg total) by mouth every 8 (eight) hours as needed.   Multiple Vitamins-Minerals (AIRBORNE PO) Take by mouth.   Multiple Vitamins-Minerals (ALIVE MULTI-VITAMIN PO) Take by mouth.   pantoprazole (PROTONIX) 40 MG tablet TAKE 1 TABLET (40 MG TOTAL) BY MOUTH 2 (TWO) TIMES DAILY. 30 MINUTES BEFORE MEALS   promethazine (PHENERGAN) 12.5 MG tablet TAKE 1 TABLET (12.5 MG TOTAL) BY MOUTH EVERY 4 HOURS AS NEEDED FOR NAUSEA AND VOMITING   propranolol (INDERAL) 10 MG tablet TAKE 1 TABLET (10 MG  TOTAL) BY MOUTH 3 (THREE) TIMES DAILY. AS NEEDED FOR RAPID HEART RATE   Risankizumab-rzaa (SKYRIZI PEN) 150 MG/ML SOAJ Inject 150 mg into the skin as directed. Every 12 weeks for maintenance.   tazarotene (AVAGE) 0.1 % cream Qhs to hands and feet prn psoriasis flares    Allergies: Compazine [prochlorperazine]  Social History   Tobacco Use   Smoking status: Former    Packs/day: 0.25    Years: 20.00    Total pack years: 5.00    Types: Cigarettes    Quit date: 11/14/2021    Years since quitting: 0.7   Smokeless tobacco: Never   Tobacco comments:    2-3 cigarettes daily   Vaping Use   Vaping Use: Never used  Substance Use Topics   Alcohol use: Yes    Alcohol/week: 0.0 standard drinks of alcohol    Comment: occas   Drug use: No    Family History  Problem Relation Age of Onset   Arthritis Mother    Hyperlipidemia Mother    Cancer Father        colon   Diabetes Brother    Breast cancer Paternal Aunt 32   Arthritis Maternal Grandmother    Hyperlipidemia Maternal Grandfather    Heart disease Neg Hx    Colon cancer Neg Hx     Review of Systems: A 12-system review of systems was performed and was negative except as noted in the HPI.  --------------------------------------------------------------------------------------------------  Physical Exam: BP 130/88 (BP Location: Left Arm, Patient Position: Sitting, Cuff Size: Normal)   Pulse 76   Ht 5' 7.5" (1.715 m)   Wt 200 lb 3.2 oz (90.8 kg)   SpO2 98%   BMI 30.89 kg/m   General:  NAD. Neck: No JVD or HJR. Lungs: Clear to auscultation bilaterally without wheezes or crackles. Heart: Regular rate and rhythm without murmurs, rubs, or gallops. Abdomen: Soft, nontender, nondistended. Extremities: No lower extremity edema.  EKG: Normal sinus rhythm with low voltage and poor R wave progression.  No significant change from prior tracing on 12/16/2019.  Lab Results  Component Value Date   WBC 4.8 07/19/2022   HGB 13.8  07/19/2022   HCT 41.2 07/19/2022   MCV 88 07/19/2022   PLT 187 07/19/2022    Lab Results  Component Value Date   NA 141 07/19/2022   K 4.5 07/19/2022   CL 104 07/19/2022   CO2 22 07/19/2022   BUN 13 07/19/2022   CREATININE 0.83 07/19/2022   GLUCOSE 89 07/19/2022   ALT 18 12/20/2021    Lab Results  Component Value Date   CHOL 221 (H) 07/19/2022   HDL 55 07/19/2022   LDLCALC 144 (H) 07/19/2022   TRIG 124 07/19/2022   CHOLHDL 4.0 07/19/2022    --------------------------------------------------------------------------------------------------  ASSESSMENT AND PLAN: Palpitations: Episodes are sporadic and seem to coincide with anxiety.  Given that they happen fairly infrequently, we have agreed to defer ambulatory cardiac monitoring.  I have asked Ms. Ovid Curd  to try to capture an episode using the EKG function of her Apple Watch and to forward the tracing to [place] through [place].  She can continue to use as needed propranolol.  Coronary artery disease without angina: Continue aspirin and statin therapy to prevent progression of disease.  Given elevated LDL on recent labs, we will increase atorvastatin to 80 mg daily.  Hyperlipidemia: LDL suboptimally controlled on last check 2 weeks ago and higher than on prior assay.  Will increase atorvastatin to 80 mg daily with plans to recheck a lipid panel and ALT in about 3 months.  Lifestyle modifications also reinforced.  Hypertension: Blood pressure borderline elevated today.  Defer medication changes in favor of lifestyle modifications.  Follow-up: Return to clinic in 12 months.  Nelva Bush, MD 07/31/2022 11:03 AM

## 2022-07-31 ENCOUNTER — Ambulatory Visit: Payer: BC Managed Care – PPO | Attending: Internal Medicine | Admitting: Internal Medicine

## 2022-07-31 ENCOUNTER — Encounter: Payer: Self-pay | Admitting: Internal Medicine

## 2022-07-31 VITALS — BP 130/88 | HR 76 | Ht 67.5 in | Wt 200.2 lb

## 2022-07-31 DIAGNOSIS — R002 Palpitations: Secondary | ICD-10-CM

## 2022-07-31 DIAGNOSIS — Z79899 Other long term (current) drug therapy: Secondary | ICD-10-CM

## 2022-07-31 DIAGNOSIS — I10 Essential (primary) hypertension: Secondary | ICD-10-CM | POA: Diagnosis not present

## 2022-07-31 DIAGNOSIS — E7800 Pure hypercholesterolemia, unspecified: Secondary | ICD-10-CM | POA: Diagnosis not present

## 2022-07-31 DIAGNOSIS — I2510 Atherosclerotic heart disease of native coronary artery without angina pectoris: Secondary | ICD-10-CM | POA: Diagnosis not present

## 2022-07-31 MED ORDER — ATORVASTATIN CALCIUM 80 MG PO TABS: 80.0000 mg | ORAL_TABLET | Freq: Every day | ORAL | 3 refills | Status: DC

## 2022-07-31 NOTE — Patient Instructions (Signed)
Medication Instructions:  Your physician recommends the following medication changes.  INCREASE: Atorvastatin to 80 mg by mouth daily   *If you need a refill on your cardiac medications before your next appointment, please call your pharmacy*   Lab Work: Your provider would like for you to return in April, 2024 to have the following labs drawn: (Lipid, ALT)  Please go to the [facility] entrance and check in at the front desk.  You do not need an appointment.  They are open from 7am-6 pm.  You will need to be fasting.  If you have labs (blood work) drawn today and your tests are completely normal, you will receive your results only by: [place] (if you have MyChart) OR A paper copy in the mail If you have any lab test that is abnormal or we need to change your treatment, we will call you to review the results.   Testing/Procedures: None ordered today   Follow-Up: At [facility], you and your health needs are our priority.  As part of our continuing mission to provide you with exceptional heart care, we have created designated Provider Care Teams.  These Care Teams include your primary Cardiologist (physician) and Advanced Practice Providers (APPs -  Physician Assistants and Nurse Practitioners) who all work together to provide you with the care you need, when you need it.  We recommend signing up for the patient portal called "MyChart".  Sign up information is provided on this After Visit Summary.  MyChart is used to connect with patients for Virtual Visits (Telemedicine).  Patients are able to view lab/test results, encounter notes, upcoming appointments, etc.  Non-urgent messages can be sent to your provider as well.   To learn more about what you can do with MyChart, go to [email or web address].    Your next appointment:   1 year(s)  Provider:   You may see Nelva Bush, MD or one of the following Advanced Practice Providers on your designated Care  Team:   Murray Hodgkins, NP Christell Faith, PA-C Cadence Kathlen Mody, PA-C Gerrie Nordmann, NP

## 2022-07-31 NOTE — Addendum Note (Signed)
Addended by: James Ivanoff D on: 07/31/2022 02:52 PM   Modules accepted: Orders

## 2022-08-15 ENCOUNTER — Encounter: Payer: Self-pay | Admitting: Obstetrics and Gynecology

## 2022-08-19 ENCOUNTER — Ambulatory Visit: Payer: BC Managed Care – PPO | Admitting: Dermatology

## 2022-08-19 DIAGNOSIS — D2239 Melanocytic nevi of other parts of face: Secondary | ICD-10-CM | POA: Diagnosis not present

## 2022-08-19 DIAGNOSIS — L409 Psoriasis, unspecified: Secondary | ICD-10-CM | POA: Diagnosis not present

## 2022-08-19 DIAGNOSIS — L820 Inflamed seborrheic keratosis: Secondary | ICD-10-CM | POA: Diagnosis not present

## 2022-08-19 DIAGNOSIS — L738 Other specified follicular disorders: Secondary | ICD-10-CM

## 2022-08-19 DIAGNOSIS — Z79899 Other long term (current) drug therapy: Secondary | ICD-10-CM

## 2022-08-19 DIAGNOSIS — D229 Melanocytic nevi, unspecified: Secondary | ICD-10-CM

## 2022-08-19 DIAGNOSIS — L988 Other specified disorders of the skin and subcutaneous tissue: Secondary | ICD-10-CM

## 2022-08-19 MED ORDER — SKYRIZI PEN 150 MG/ML ~~LOC~~ SOAJ: 150.0000 mg | SUBCUTANEOUS | 2 refills | Status: DC

## 2022-08-19 NOTE — Patient Instructions (Signed)
Cryotherapy Aftercare  Wash gently with soap and water everyday.   Apply Vaseline and Band-Aid daily until healed.   Basic OTC daily skin care regimen to prevent photoaging:   Recommend facial moisturizer with sunscreen SPF 30 every morning (OTC brands include CeraVe AM, Neutrogena, Eucerin, Cetaphil, Aveeno, La Roche Posay).  Can also apply a topical Vit C serum which is an antioxidant (OTC brands include CeraVe, La Roche Posay, and The Ordinary) underneath sunscreen in morning. If you are outside during the day in the summer for extended periods, especially swimming and/or sweating, make sure you apply a water resistant facial sunscreen lotion spf 30 or higher.   At night recommend a cream with retinol (a vitamin A derivative which stimulates collagen production) like CeraVe skin renewing retinol serum or ROC retinol correxion cream or Neutrogena rapid wrinkle repair cream. Retinol may cause skin irritation in people with sensitive skin.  Can use it every other day and/or apply on top of a hyaluronic acid (HA) moisturizer/serum (Neutrogena Hydroboost water cream) if better tolerated that way.  Retinol may also help with lightening brown spots.   Our office sells high quality, medically tested skin care lines such as Elta MD sunscreens (with Zinc), and Alastin skin care products, which are very effective in treating photoaging. The Alastin line includes cosmeceutical grade Vit.C serum, HA serum, Elastin stimulating moisturizers/serums, lightening serum, and sunscreens.  If you want prescription treatment, then you would need an appointment (Rx tretinoin and fade creams, Botox, filler injections, laser treatments, etc.) These prescriptions and procedures are not covered by insurance but work very well.  Due to recent changes in healthcare laws, you may see results of your pathology and/or laboratory studies on MyChart before the doctors have had a chance to review them. We understand that in some  cases there may be results that are confusing or concerning to you. Please understand that not all results are received at the same time and often the doctors may need to interpret multiple results in order to provide you with the best plan of care or course of treatment. Therefore, we ask that you please give [place] 2 business days to thoroughly review all your results before contacting the office for clarification. Should we see a critical lab result, you will be contacted sooner.   If You Need Anything After Your Visit  If you have any questions or concerns for your doctor, please call our main line at [phone] and press option 4 to reach your doctor's medical assistant. If no one answers, please leave a voicemail as directed and we will return your call as soon as possible. Messages left after 4 pm will be answered the following business day.   You may also send [place] a message via [place]. We typically respond to MyChart messages within 1-2 business days.  For prescription refills, please ask your pharmacy to contact our office. Our fax number is [phone].  If you have an urgent issue when the clinic is closed that cannot wait until the next business day, you can page your doctor at the number below.    Please note that while we do our best to be available for urgent issues outside of office hours, we are not available 24/7.   If you have an urgent issue and are unable to reach [place], you may choose to seek medical care at your doctor's office, retail clinic, urgent care center, or emergency room.  If you have a medical emergency, please immediately call 911  or go to the emergency department.  Pager Numbers  - Dr. Nehemiah Massed: [phone]  - Dr. Laurence Ferrari: [phone]  - Dr. Nicole Kindred: [phone]  In the event of inclement weather, please call our main line at [phone] for an update on the status of any delays or closures.  Dermatology Medication Tips: Please keep the boxes that  topical medications come in in order to help keep track of the instructions about where and how to use these. Pharmacies typically print the medication instructions only on the boxes and not directly on the medication tubes.   If your medication is too expensive, please contact our office at [phone] option 4 or send [place] a message through [place].   We are unable to tell what your co-pay for medications will be in advance as this is different depending on your insurance coverage. However, we may be able to find a substitute medication at lower cost or fill out paperwork to get insurance to cover a needed medication.   If a prior authorization is required to get your medication covered by your insurance company, please allow [place] 1-2 business days to complete this process.  Drug prices often vary depending on where the prescription is filled and some pharmacies may offer cheaper prices.  The website www.goodrx.com contains coupons for medications through different pharmacies. The prices here do not account for what the cost may be with help from insurance (it may be cheaper with your insurance), but the website can give you the price if you did not use any insurance.  - You can print the associated coupon and take it with your prescription to the pharmacy.  - You may also stop by our office during regular business hours and pick up a GoodRx coupon card.  - If you need your prescription sent electronically to a different pharmacy, notify our office through [facility] or by phone at [phone] option 4.     Si Usted Necesita Algo Despus de Su Visita  Tambin puede enviarnos un mensaje a travs de Pharmacist, community. Por lo general respondemos a los mensajes de MyChart en el transcurso de 1 a 2 das hbiles.  Para renovar recetas, por favor pida a su farmacia que se ponga en contacto con nuestra oficina. Harland Dingwall de fax es [place] [phone].  Si tiene un asunto urgente cuando la clnica  est cerrada y que no puede esperar hasta el siguiente da hbil, puede llamar/localizar a su doctor(a) al nmero que aparece a continuacin.   Por favor, tenga en cuenta que aunque hacemos todo lo posible para estar disponibles para asuntos urgentes fuera del horario de [place], no estamos disponibles las 24 horas del da, los 7 das de la [place].   Si tiene un problema urgente y no puede comunicarse con nosotros, puede optar por buscar atencin mdica  en el consultorio de su doctor(a), en una clnica privada, en un centro de atencin urgente o en una sala de emergencias.  Si tiene Engineering geologist, por favor llame inmediatamente al 911 o vaya a la sala de emergencias.  Nmeros de bper  - Dr. Nehemiah Massed: [phone]  - Dra. Moye: [phone]  - Dra. Nicole Kindred: [phone]  En caso de inclemencias del [place], por favor llame a Johnsie Kindred principal al [phone] para una actualizacin sobre el [place] de cualquier retraso o cierre.  Consejos para la medicacin en dermatologa: Por favor, guarde las cajas en las que vienen los medicamentos de uso tpico para ayudarle a seguir las instrucciones sobre dnde  y cmo usarlos. Las farmacias generalmente imprimen las instrucciones del medicamento slo en las cajas y no directamente en los tubos del [place].   Si su medicamento es muy caro, por favor, pngase en contacto con Zigmund Daniel llamando al [phone] y presione la opcin 4 o envenos un mensaje a travs de Pharmacist, community.   No podemos decirle cul ser su copago por los medicamentos por adelantado ya que esto es diferente dependiendo de la cobertura de su seguro. Sin embargo, es posible que podamos encontrar un medicamento sustituto a Electrical engineer un formulario para que el seguro cubra el medicamento que se considera necesario.   Si se requiere una autorizacin previa para que su compaa de seguros [place] su medicamento, por favor permtanos de 1 a 2 das hbiles para  completar este proceso.  Los precios de los medicamentos varan con frecuencia dependiendo del Environmental consultant de dnde se surte la receta y alguna farmacias pueden ofrecer precios ms baratos.  El sitio web www.goodrx.com tiene cupones para medicamentos de Airline pilot. Los precios aqu no tienen en cuenta lo que podra costar con la ayuda del seguro (puede ser ms barato con su seguro), pero el sitio web puede darle el precio si no utiliz Research scientist (physical sciences).  - Puede imprimir el cupn correspondiente y llevarlo con su receta a la farmacia.  - Tambin puede pasar por nuestra oficina durante el horario de atencin regular y Charity fundraiser una tarjeta de cupones de GoodRx.  - Si necesita que su receta se enve electrnicamente a una farmacia diferente, informe a nuestra oficina a travs de MyChart de [place] o por telfono llamando al [phone] y presione la opcin 4.

## 2022-08-19 NOTE — Progress Notes (Signed)
Follow-Up Visit   Subjective  Colleen Washington is a 62 y.o. female who presents for the following: Psoriasis (Patient here today for 6 month psoriasis follow up at feet and hands. Patient currently on Skyrizi sq injections q 12 wks, started ~ 02/2021, Enstilar foam, Tazarotene ).  Doing well.  No side effects.  Patient with a spot at left eye that is bothersome for patient and is growing.   She has other spots on the face to check that are bothersome.  The following portions of the chart were reviewed this encounter and updated as appropriate:       Review of Systems:  No other skin or systemic complaints except as noted in HPI or Assessment and Plan.  Objective  Well appearing patient in no apparent distress; mood and affect are within normal limits.  A focused examination was performed including face, hands, feet, elbows. Relevant physical exam findings are noted in the Assessment and Plan.  hands, feet Hyperkeratosis at bilateral plantar feet, mild erythema and scale at palms, elbows  Left Upper Eyelid Tiny erythematous stuck-on, waxy papule  left forehead 3 mm pink flesh papule  glabella yellow papule with a central dell.   face Rhytides and volume loss.     Assessment & Plan  Psoriasis hands, feet  Chronic condition with duration or expected duration over one year. Currently well-controlled.   Counseling and coordination of care for severe psoriasis on systemic treatment  psoriasis - severe on systemic treatment.  Psoriasis is a chronic non-curable, but treatable genetic/hereditary disease that may have other systemic features affecting other organ systems such as joints (Psoriatic Arthritis).  It is linked with heart disease, inflammatory bowel disease, non-alcoholic fatty liver disease, and depression. Significant skin psoriasis and/or psoriatic arthritis may have significant symptoms and affects activities of daily activity and often benefits from systemic  treatments.  These systemic treatments have some potential side effects including immunosuppression and require pre-treatment laboratory screening and periodic laboratory monitoring and periodic in person evaluation and monitoring by the attending dermatologist physician (long term medication management).   Cont Skyrizi sq injections q 12 wks Cont Tazarotene 0.1% cr 1x/wk Cont Enstilar foam qd aa hands, feet, elbows  Topical steroids (such as triamcinolone, fluocinolone, fluocinonide, mometasone, clobetasol, halobetasol, betamethasone, hydrocortisone) can cause thinning and lightening of the skin if they are used for too long in the same area. Your physician has selected the right strength medicine for your problem and area affected on the body. Please use your medication only as directed by your physician to prevent side effects.    Related Medications Calcipotriene-Betameth Diprop (ENSTILAR) 0.005-0.064 % FOAM Qd to aa hands, feet, elbows until clear, then prn flares  tazarotene (AVAGE) 0.1 % cream Qhs to hands and feet prn psoriasis flares  Inflamed seborrheic keratosis Left Upper Eyelid  Symptomatic, irritating, patient would like treated.   Destruction of lesion - Left Upper Eyelid  Destruction method: cryotherapy   Informed consent: discussed and consent obtained   Lesion destroyed using liquid nitrogen: Yes   Region frozen until ice ball extended beyond lesion: Yes   Outcome: patient tolerated procedure well with no complications   Post-procedure details: wound care instructions given   Additional details:  Prior to procedure, discussed risks of blister formation, small wound, skin dyspigmentation, or rare scar following cryotherapy. Recommend Vaseline ointment to treated areas while healing.   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  Plan biopsy on follow up  since bothersome.    Sebaceous hyperplasia glabella  Benign-appearing.   Observation.  Call clinic for new or changing lesions.    Will plan to treat with ED at follow up. Patient advised would be cosmetic and not covered by insurance.   Discussed cosmetic procedure, noncovered.  $60 for 1st lesion and $15 for each additional lesion if done on the same day.  Maximum charge $350.  One touch-up treatment included no charge. Discussed risks of treatment including dyspigmentation, small scar, and/or recurrence. Recommend daily broad spectrum sunscreen SPF 30+/photoprotection to treated areas once healed.   Elastosis of skin face  Basic OTC daily skin care regimen to prevent photoaging:   Recommend facial moisturizer with sunscreen SPF 30 every morning (OTC brands include CeraVe AM, Neutrogena, Eucerin, Cetaphil, Aveeno, La Roche Posay).  Can also apply a topical Vit C serum which is an antioxidant (OTC brands include CeraVe, La Roche Posay, and The Ordinary) underneath sunscreen in morning. If you are outside during the day in the summer for extended periods, especially swimming and/or sweating, make sure you apply a water resistant facial sunscreen lotion spf 30 or higher.   At night recommend a cream with retinol (a vitamin A derivative which stimulates collagen production) like CeraVe skin renewing retinol serum or ROC retinol correxion cream or Neutrogena rapid wrinkle repair cream. Retinol may cause skin irritation in people with sensitive skin.  Can use it every other day and/or apply on top of a hyaluronic acid (HA) moisturizer/serum (Neutrogena Hydroboost water cream) if better tolerated that way.  Retinol may also help with lightening brown spots.   Our office sells high quality, medically tested skin care lines such as Elta MD sunscreens (with Zinc), and Alastin skin care products, which are very effective in treating photoaging. The Alastin line includes cosmeceutical grade Vit.C serum, HA serum, Elastin stimulating moisturizers/serums, lightening serum, and  sunscreens.  If you want prescription treatment, then you would need an appointment (Rx tretinoin and fade creams, Botox, filler injections, laser treatments, etc.) These prescriptions and procedures are not covered by insurance but work very well.    Return in about 6 months (around 02/17/2023) for Psoriasis, 1 month for bx and treat sebaceous hyperplasia.  Graciella Belton, RMA, am acting as scribe for Brendolyn Patty, MD .  Documentation: I have reviewed the above documentation for accuracy and completeness, and I agree with the above.  Brendolyn Patty MD

## 2022-08-26 ENCOUNTER — Ambulatory Visit
Admission: RE | Admit: 2022-08-26 | Discharge: 2022-08-26 | Disposition: A | Discharge status: Home / Self Care | Payer: BC Managed Care – PPO | Source: Ambulatory Visit | Attending: Urgent Care | Admitting: Urgent Care

## 2022-08-26 VITALS — BP 142/90 | HR 71 | Temp 97.9°F | Resp 16

## 2022-08-26 DIAGNOSIS — J0100 Acute maxillary sinusitis, unspecified: Secondary | ICD-10-CM

## 2022-08-26 DIAGNOSIS — B3731 Acute candidiasis of vulva and vagina: Secondary | ICD-10-CM | POA: Diagnosis not present

## 2022-08-26 MED ORDER — FLUCONAZOLE 150 MG PO TABS: 150.0000 mg | ORAL_TABLET | Freq: Once | ORAL | 0 refills | Status: AC

## 2022-08-26 MED ORDER — PREDNISONE 20 MG PO TABS: ORAL_TABLET | ORAL | 0 refills | Status: AC

## 2022-08-26 MED ORDER — AMOXICILLIN-POT CLAVULANATE 875-125 MG PO TABS: 1.0000 | ORAL_TABLET | Freq: Two times a day (BID) | ORAL | 0 refills | Status: DC

## 2022-08-26 NOTE — Discharge Instructions (Addendum)
Follow up here or with your primary care provider if your symptoms are worsening or not improving with treatment.     

## 2022-08-26 NOTE — ED Provider Notes (Signed)
Colleen Washington    CSN: [id number] Arrival date & time: 08/26/22  1640      History   Chief Complaint Chief Complaint  Patient presents with   Headache    I have had headaches for a week off and on. My ears feel like I have pressure in them. Also a cough and acid reflex. - Entered by patient   Otalgia   Cough    HPI Colleen Washington is a 62 y.o. female.    Headache Associated symptoms: cough and ear pain   Otalgia Associated symptoms: cough and headaches   Cough Associated symptoms: ear pain and headaches     Presents to urgent care with complaint of cough, headache, bilateral ear pressure x 1 week.  She is using sudafed and ibuprofen for her symptoms.  Past medical history includes migraine, anxiety with panic disorder, CAD, chronic bronchitis (COPD)  Chart indicates she has taken prednisone in the past and she states she has tolerated it without side effect.  She requests prescription for fluconazole if an antibiotic is prescribed.  Past Medical History:  Diagnosis Date   allergic rhinitis    Anxiety    AR (allergic rhinitis)    Asthma    Depression    Endometriosis    GERD (gastroesophageal reflux disease)    H/O migraine    Herniated intervertebral disc    History of diverticulitis of colon    Hyperlipidemia    Hypertension    IBS (irritable bowel syndrome)    Irritable bowel syndrome    Nonobstructive CAD (coronary artery disease)    a. 11/2019 Cor CTA: Cor Ca2+ = 141 (95th %'ile). LAD 25-49p.   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   SVT (supraventricular tachycardia) 08/15/2018   Acute bronchitis with COPD ([place]) 10/21/2017   Thyroid nodule 10/28/2016   Ovarian cyst, right 09/30/2015   Endometriosis 08/10/2015   Status post vaginal hysterectomy 08/10/2015   Anxiety 08/10/2015   Obesity (BMI 30.0-34.9) 08/10/2015   Vitamin D deficiency 05/26/2015   Weight gain 02/22/2015   Menopause 09/02/2014   Visit for preventive health examination 08/24/2013   Snoring 08/24/2013   Symptoms, such as flushing, sleeplessness, headache, lack of concentration, associated with the menopause 08/24/2013   Encounter for drug screening 08/13/2013   Migraine headache 08/26/2012   Panic disorder without agoraphobia with mild panic attacks 08/26/2012   Bronchitis, chronic ([place]) 05/09/2012   History of diverticulitis of colon    GERD (gastroesophageal reflux disease)    Hyperlipidemia    Irritable bowel syndrome    Essential hypertension    Herniated intervertebral disc    Tobacco use 03/04/2012    Past Surgical History:  Procedure Laterality Date   ABDOMINAL HYSTERECTOMY  2003   [facility]   BREAST BIOPSY Left 08/2014   benign-BENIGN BREAST TISSUE WITH FIBROSIS   DILATION AND CURETTAGE OF UTERUS      OB History  Gravida  3   Para  2   Term  2   Preterm      AB  1   Living  2      SAB  1   IAB      Ectopic      Multiple      Live Births  2            Home Medications    Prior to Admission medications   Medication Sig Start Date End Date Taking? Authorizing Provider  albuterol (VENTOLIN HFA) 108 (90 Base) MCG/ACT inhaler INHALE 1-2 PUFFS INTO THE LUNGS EVERY 4 HOURS AS NEEDED FOR WHEEZING OR SHORTNESS OF BREATH. 01/22/21   Crecencio Mc, MD  aspirin EC 81 MG tablet Take 1 tablet (81 mg total) by mouth daily. Swallow whole. 05/17/21   End, Harrell Gave, MD   atorvastatin (LIPITOR) 80 MG tablet Take 1 tablet (80 mg total) by mouth daily. 07/31/22   End, Harrell Gave, MD  buPROPion (WELLBUTRIN XL) 300 MG 24 hr tablet TAKE 1 TABLET BY MOUTH EVERY DAY 05/15/22   Crecencio Mc, MD  busPIRone (BUSPAR) 15 MG tablet TAKE 1 TABLET (15 MG TOTAL) BY MOUTH 2 (TWO) TIMES DAILY AS NEEDED. 08/24/21   Dutch Quint B, FNP  Calcipotriene-Betameth Diprop (ENSTILAR) 0.005-0.064 % FOAM Qd to aa hands, feet, elbows until clear, then prn flares 02/04/22   Brendolyn Patty, MD  cetirizine (ZYRTEC) 10 MG tablet Take 10 mg by mouth daily. Reported on 09/12/2015    [provider]  estradiol (ESTRACE) 1 MG tablet Take 1 tablet (1 mg total) by mouth daily. Patient not taking: Reported on 07/31/2022 07/19/22 10/17/22  Harlin Heys, MD  fluticasone [facility]) 50 MCG/ACT nasal spray USE 2 SPRAYS EACH NOSTRIL DAILY 06/10/17   Crecencio Mc, MD  fluticasone-salmeterol (ADVAIR) 100-50 MCG/ACT AEPB Inhale 1 puff into the lungs 2 (two) times daily. Patient not taking: Reported on 07/31/2022 11/28/21   Crecencio Mc, MD  hydrOXYzine (VISTARIL) 25 MG capsule Take 1 capsule (25 mg total) by mouth every 8 (eight) hours as needed. 10/26/20   Crecencio Mc, MD  Multiple Vitamins-Minerals (AIRBORNE PO) Take by mouth.    [provider]  Multiple Vitamins-Minerals (ALIVE MULTI-VITAMIN PO) Take by mouth.    [provider]  pantoprazole (PROTONIX) 40 MG tablet TAKE 1 TABLET (40 MG TOTAL) BY MOUTH 2 (TWO) TIMES DAILY. [place] 07/08/22   Dutch Quint B, FNP  promethazine (PHENERGAN) 12.5 MG tablet TAKE 1 TABLET (12.5 MG TOTAL) BY MOUTH EVERY 4 HOURS AS NEEDED FOR NAUSEA AND VOMITING 05/27/22   Crecencio Mc, MD  propranolol (INDERAL) 10 MG tablet TAKE 1 TABLET (10 MG TOTAL) BY MOUTH 3 (THREE) TIMES DAILY. AS NEEDED FOR RAPID HEART RATE 06/23/19   Crecencio Mc, MD  Risankizumab-rzaa (SKYRIZI PEN) 150 MG/ML SOAJ Inject 150 mg into the skin as directed.  Every 12 weeks for maintenance. 08/19/22   Brendolyn Patty, MD  tazarotene (AVAGE) 0.1 % cream Qhs to hands and feet prn psoriasis flares 02/04/22   Brendolyn Patty, MD    Family History Family History  Problem Relation Age of Onset   Arthritis Mother    Hyperlipidemia Mother    Cancer Father        colon   Diabetes Brother    Breast cancer Paternal Aunt 47   Arthritis Maternal Grandmother    Hyperlipidemia Maternal Grandfather    Heart disease Neg Hx  Colon cancer Neg Hx     Social History Social History   Tobacco Use   Smoking status: Former    Packs/day: 0.25    Years: 20.00    Total pack years: 5.00    Types: Cigarettes    Quit date: 11/14/2021    Years since quitting: 0.7   Smokeless tobacco: Never   Tobacco comments:    2-3 cigarettes daily   Vaping Use   Vaping Use: Never used  Substance Use Topics   Alcohol use: Yes    Alcohol/week: 0.0 standard drinks of alcohol    Comment: occas   Drug use: No     Allergies   Compazine [prochlorperazine]   Review of Systems Review of Systems  HENT:  Positive for ear pain.   Respiratory:  Positive for cough.   Neurological:  Positive for headaches.     Physical Exam Triage Vital Signs ED Triage Vitals  Enc Vitals Group     BP      Pulse      Resp      Temp      Temp src      SpO2      Weight      Height      Head Circumference      Peak Flow      Pain Score      Pain Loc      Pain Edu?      Excl. in [place]?    No data found.  Updated Vital Signs There were no vitals taken for this visit.  Visual Acuity Right Eye Distance:   Left Eye Distance:   Bilateral Distance:    Right Eye Near:   Left Eye Near:    Bilateral Near:     Physical Exam Vitals reviewed.  Constitutional:      Appearance: She is well-developed.  Neurological:     Mental Status: She is alert and oriented to person, place, and time.  Psychiatric:        Mood and Affect: Mood normal.        Behavior: Behavior normal.      UC  Treatments / Results  Labs (all labs ordered are listed, but only abnormal results are displayed) Labs Reviewed - No data to display  EKG   Radiology No results found.  Procedures Procedures (including critical care time)  Medications Ordered in UC Medications - No data to display  Initial Impression / Assessment and Plan / UC Course  I have reviewed the triage vital signs and the nursing notes.  Pertinent labs & imaging results that were available during my care of the patient were reviewed by me and considered in my medical decision making (see chart for details).   Patient is afebrile here without recent antipyretics. Satting well on room air. Overall is ill appearing, well hydrated, without respiratory distress.   Suspect viral etiology for symptoms of sinusitis which is likely resulting in eustachian tube disorder.  Discussed prescription for anti-inflammatory prednisone with the patient which she states she would like to have.  Given her symptoms are now greater than 1 week, will prescribe a backup antibiotic should her symptoms not resolve before developing a secondary bacterial infection.  Will also prescribe fluconazole at her request.  Final Clinical Impressions(s) / UC Diagnoses   Final diagnoses:  None   Discharge Instructions   None    ED Prescriptions   None    PDMP not reviewed this encounter.  Rose Phi, [place] 08/26/22 1725

## 2022-08-26 NOTE — ED Triage Notes (Signed)
Patient presents to UC for cough, HA, bilateral ear pressure x 1 week. Takinh sudafed and ibuprofen.

## 2022-08-27 ENCOUNTER — Telehealth: Payer: BC Managed Care – PPO | Admitting: Obstetrics and Gynecology

## 2022-08-27 DIAGNOSIS — M955 Acquired deformity of pelvis: Secondary | ICD-10-CM | POA: Diagnosis not present

## 2022-08-27 DIAGNOSIS — M9905 Segmental and somatic dysfunction of pelvic region: Secondary | ICD-10-CM | POA: Diagnosis not present

## 2022-08-27 DIAGNOSIS — M9903 Segmental and somatic dysfunction of lumbar region: Secondary | ICD-10-CM | POA: Diagnosis not present

## 2022-08-27 DIAGNOSIS — M62830 Muscle spasm of back: Secondary | ICD-10-CM | POA: Diagnosis not present

## 2022-09-15 ENCOUNTER — Telehealth: Payer: BC Managed Care – PPO | Admitting: Family

## 2022-09-15 DIAGNOSIS — J069 Acute upper respiratory infection, unspecified: Secondary | ICD-10-CM

## 2022-09-15 MED ORDER — FLUTICASONE PROPIONATE 50 MCG/ACT NA SUSP: 2.0000 | Freq: Every day | NASAL | 6 refills | Status: AC

## 2022-09-15 MED ORDER — CETIRIZINE HCL 10 MG PO TABS: 10.0000 mg | ORAL_TABLET | Freq: Every day | ORAL | 1 refills | Status: AC

## 2022-09-15 NOTE — Progress Notes (Signed)
Virtual Visit Consent   Colleen Washington, you are scheduled for a virtual visit with a [facility] provider today. Just as with appointments in the office, your consent must be obtained to participate. Your consent will be active for this visit and any virtual visit you may have with one of our providers in the next 365 days. If you have a MyChart account, a copy of this consent can be sent to you electronically.  As this is a virtual visit, video technology does not allow for your provider to perform a traditional examination. This may limit your provider's ability to fully assess your condition. If your provider identifies any concerns that need to be evaluated in person or the need to arrange testing (such as labs, EKG, etc.), we will make arrangements to do so. Although advances in technology are sophisticated, we cannot ensure that it will always work on either your end or our end. If the connection with a video visit is poor, the visit may have to be switched to a telephone visit. With either a video or telephone visit, we are not always able to ensure that we have a secure connection.  By engaging in this virtual visit, you consent to the provision of healthcare and authorize for your insurance to be billed (if applicable) for the services provided during this visit. Depending on your insurance coverage, you may receive a charge related to this service.  I need to obtain your verbal consent now. Are you willing to proceed with your visit today? Colleen Washington has provided verbal consent on 09/15/2022 for a virtual visit (video or telephone). Evelina Dun, FNP  Date: 09/15/2022 1:25 PM  Virtual Visit via Video Note   I, Evelina Dun, connected with  Colleen Washington  ([id number], 12-26-1960) on 09/15/22 at  3:00 PM EST by a video-enabled telemedicine application and verified that I am speaking with the correct person using two identifiers.  Location: Patient: Virtual Visit Location Patient:  Home Provider: Virtual Visit Location Provider: Home Office   I discussed the limitations of evaluation and management by telemedicine and the availability of in person appointments. The patient expressed understanding and agreed to proceed.    History of Present Illness: Colleen Washington is a 62 y.o. who identifies as a female who was assigned female at birth, and is being seen today for recurrent ear pain and sinus infection. She was treated with Augmentin and prednisone on 08/26/22. Reports she was feeling, but started  09/10/22 she started with ear pain and sinus pressure. She took a home COVID test that was negative.   HPI: Sinusitis This is a new problem. The current episode started in the past 7 days. The problem has been gradually worsening since onset. There has been no fever. Her pain is at a severity of 0/10. She is experiencing no pain. Associated symptoms include congestion, coughing, headaches, a hoarse voice, sneezing and a sore throat. Pertinent negatives include no shortness of breath or sinus pressure (improved). Past treatments include acetaminophen. The treatment provided mild relief.    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   SVT (supraventricular tachycardia) 08/15/2018   Acute bronchitis with COPD ([place]) 10/21/2017   Thyroid nodule 10/28/2016   Ovarian cyst, right 09/30/2015   Endometriosis 08/10/2015   Status post vaginal hysterectomy 08/10/2015    Anxiety 08/10/2015   Obesity (BMI 30.0-34.9) 08/10/2015   Vitamin D deficiency 05/26/2015   Weight gain 02/22/2015   Menopause 09/02/2014   Visit for preventive health examination 08/24/2013   Snoring 08/24/2013   Symptoms, such as flushing, sleeplessness, headache, lack of concentration, associated with the menopause 08/24/2013   Encounter for drug screening 08/13/2013   Migraine headache 08/26/2012   Panic disorder without agoraphobia with mild panic attacks 08/26/2012   Bronchitis, chronic ([place]) 05/09/2012   History of diverticulitis of colon    GERD (gastroesophageal reflux disease)    Hyperlipidemia    Irritable bowel syndrome    Essential hypertension    Herniated intervertebral disc    Tobacco use 03/04/2012    Allergies:  Allergies  Allergen Reactions   Compazine [Prochlorperazine] Swelling   Medications:  Current Outpatient Medications:    cetirizine (ZYRTEC ALLERGY) 10 MG tablet, Take 1 tablet (10 mg total) by mouth daily., Disp: 90 tablet, Rfl: 1   fluticasone (FLONASE) 50 MCG/ACT nasal spray, Place 2 sprays into both nostrils daily., Disp: 16 g, Rfl: 6   albuterol (VENTOLIN HFA) 108 (90 Base) MCG/ACT inhaler, INHALE 1-2 PUFFS INTO THE LUNGS EVERY 4 HOURS AS NEEDED FOR WHEEZING OR SHORTNESS OF BREATH., Disp: 18 each, Rfl: 2   aspirin EC 81 MG tablet, Take 1 tablet (81 mg total) by mouth daily. Swallow whole., Disp: , Rfl:    atorvastatin (LIPITOR) 80 MG tablet, Take 1 tablet (80 mg total) by mouth daily., Disp: 90 tablet, Rfl: 3   buPROPion (WELLBUTRIN XL) 300 MG 24 hr tablet, TAKE 1 TABLET BY MOUTH EVERY DAY, Disp: 90 tablet, Rfl: 1   busPIRone (BUSPAR) 15 MG tablet, TAKE 1 TABLET (15 MG TOTAL) BY MOUTH 2 (TWO) TIMES DAILY AS NEEDED., Disp: 180 tablet, Rfl: 2   Calcipotriene-Betameth Diprop (ENSTILAR) 0.005-0.064 % FOAM, Qd to aa hands, feet, elbows until clear, then prn flares, Disp: 60 g, Rfl: 6   fluticasone-salmeterol (ADVAIR) 100-50 MCG/ACT AEPB, Inhale 1 puff into  the lungs 2 (two) times daily. (Patient not taking: Reported on 07/31/2022), Disp: 60 each, Rfl: 0   hydrOXYzine (VISTARIL) 25 MG capsule, Take 1 capsule (25 mg total) by mouth every 8 (eight) hours as needed., Disp: 60 capsule, Rfl: 0   Multiple Vitamins-Minerals (AIRBORNE PO), Take by mouth., Disp: , Rfl:    Multiple Vitamins-Minerals (ALIVE MULTI-VITAMIN PO), Take by mouth., Disp: , Rfl:    pantoprazole (PROTONIX) 40 MG tablet, TAKE 1 TABLET (40 MG TOTAL) BY MOUTH 2 (TWO) TIMES DAILY. 30 MINUTES BEFORE MEALS, Disp: 180 tablet, Rfl: 0   promethazine (PHENERGAN) 12.5 MG tablet, TAKE 1 TABLET (12.5 MG TOTAL) BY MOUTH EVERY 4 HOURS AS NEEDED FOR NAUSEA AND VOMITING, Disp: 30 tablet, Rfl: 0   propranolol (INDERAL) 10 MG tablet, TAKE 1 TABLET (10 MG TOTAL) BY MOUTH 3 (THREE) TIMES DAILY. AS NEEDED FOR RAPID HEART RATE, Disp: 30 tablet, Rfl: 0   Risankizumab-rzaa (SKYRIZI PEN) 150 MG/ML SOAJ, Inject 150 mg into the skin as directed. Every 12 weeks for maintenance., Disp: 1 mL, Rfl: 2   tazarotene (AVAGE) 0.1 % cream, Qhs to hands and feet prn psoriasis flares,  Disp: 60 g, Rfl: 6  Observations/Objective: Patient is well-developed, well-nourished in no acute distress.  Resting comfortably  at home.  Head is normocephalic, atraumatic.  No labored breathing.  Speech is clear and coherent with logical content.  Patient is alert and oriented at baseline.  Nasal congestion  Assessment and Plan: 1. Viral URI - cetirizine (ZYRTEC ALLERGY) 10 MG tablet; Take 1 tablet (10 mg total) by mouth daily.  Dispense: 90 tablet; Refill: 1 - fluticasone (FLONASE) 50 MCG/ACT nasal spray; Place 2 sprays into both nostrils daily.  Dispense: 16 g; Refill: 6  - Take meds as prescribed - Use a cool mist humidifier  -Use saline nose sprays frequently -Force fluids -For any cough or congestion  Use plain Mucinex- regular strength or max strength is fine -For fever or aces or pains- take tylenol or ibuprofen. -Throat  lozenges if help -Follow up if symptoms worsen or do not improve   Follow Up Instructions: I discussed the assessment and treatment plan with the patient. The patient was provided an opportunity to ask questions and all were answered. The patient agreed with the plan and demonstrated an understanding of the instructions.  A copy of instructions were sent to the patient via MyChart unless otherwise noted below.    The patient was advised to call back or seek an in-person evaluation if the symptoms worsen or if the condition fails to improve as anticipated.  Time:  I spent 8 minutes with the patient via telehealth technology discussing the above problems/concerns.    Evelina Dun, FNP

## 2022-09-17 ENCOUNTER — Other Ambulatory Visit: Payer: Self-pay | Admitting: Family

## 2022-09-17 MED ORDER — AMOXICILLIN-POT CLAVULANATE 875-125 MG PO TABS: 1.0000 | ORAL_TABLET | Freq: Two times a day (BID) | ORAL | 0 refills | Status: DC

## 2022-09-24 ENCOUNTER — Ambulatory Visit: Payer: BC Managed Care – PPO | Admitting: Dermatology

## 2022-09-24 DIAGNOSIS — Z79899 Other long term (current) drug therapy: Secondary | ICD-10-CM

## 2022-09-24 DIAGNOSIS — D3611 Benign neoplasm of peripheral nerves and autonomic nervous system of face, head, and neck: Secondary | ICD-10-CM

## 2022-09-24 DIAGNOSIS — D489 Neoplasm of uncertain behavior, unspecified: Secondary | ICD-10-CM

## 2022-09-24 DIAGNOSIS — L720 Epidermal cyst: Secondary | ICD-10-CM

## 2022-09-24 DIAGNOSIS — L409 Psoriasis, unspecified: Secondary | ICD-10-CM | POA: Diagnosis not present

## 2022-09-24 DIAGNOSIS — L738 Other specified follicular disorders: Secondary | ICD-10-CM

## 2022-09-24 NOTE — Progress Notes (Signed)
Follow-Up Visit   Subjective  Colleen Washington is a 62 y.o. female who presents for the following: Nevus (Patient here today for biopsy of nevus at left forehead and to treat sebaceous hyperplasia. ).   The following portions of the chart were reviewed this encounter and updated as appropriate:       Review of Systems:  No other skin or systemic complaints except as noted in HPI or Assessment and Plan.  Objective  Well appearing patient in no apparent distress; mood and affect are within normal limits.  A focused examination was performed including face. Relevant physical exam findings are noted in the Assessment and Plan.  glabella x 2, L upper chin x 1 (3) Small yellow papules with a central dell forehead. Flesh waxy papule with crusting L chin        Left Forehead 4 mm pink flesh papule      hands,feet Clear today    Assessment & Plan  Sebaceous hyperplasia glabella x 2, L upper chin x 1  Vrs SK L chin  Discussed cosmetic procedure, noncovered.  $60 for 1st lesion and $15 for each additional lesion if done on the same day.  Maximum charge $350.  One touch-up treatment included no charge. Discussed risks of treatment including dyspigmentation, small scar, and/or recurrence. Recommend daily broad spectrum sunscreen SPF 30+/photoprotection to treated areas once healed.   ED to 3 lesions today, $90  Destruction of lesion - glabella x 2, L upper chin x 1  Destruction method: electrodesiccation and curettage   Destruction method comment:  Electrodessication only, not curretage Informed consent: discussed and consent obtained   Timeout:  patient name, date of birth, surgical site, and procedure verified Procedure prep:  Patient was prepped and draped in usual sterile fashion Prep type:  Isopropyl alcohol Anesthesia: the lesion was anesthetized in a standard fashion   Anesthetic:  1% lidocaine w/ epinephrine 1-100,000 buffered w/ 8.4% NaHCO3 Outcome: patient  tolerated procedure well with no complications   Post-procedure details: wound care instructions given   Additional details:  Prior to procedure, discussed risks of blister formation, small wound, skin dyspigmentation, or rare scar following ED. Recommend Vaseline ointment to treated areas while healing.   Neoplasm of uncertain behavior Left Forehead  Epidermal / dermal shaving  Lesion diameter (cm):  0.4 Informed consent: discussed and consent obtained   Patient was prepped and draped in usual sterile fashion: area prepped with alcohol. Anesthesia: the lesion was anesthetized in a standard fashion   Anesthetic:  1% lidocaine w/ epinephrine 1-100,000 buffered w/ 8.4% NaHCO3 Instrument used: flexible razor blade   Hemostasis achieved with: pressure, aluminum chloride and electrodesiccation   Outcome: patient tolerated procedure well   Post-procedure details: wound care instructions given   Post-procedure details comment:  Ointment and small bandage applied  Specimen 1 - Surgical pathology Differential Diagnosis: Nevus with Cyst vs Neurofibroma r/o BCC  Check Margins: No 3 mm pink flesh papule  Psoriasis hands,feet  Chronic condition with duration or expected duration over one year. Currently well-controlled on Skyrizi.   Counseling and coordination of care for severe psoriasis on systemic treatment  psoriasis - severe on systemic treatment.  Psoriasis is a chronic non-curable, but treatable genetic/hereditary disease that may have other systemic features affecting other organ systems such as joints (Psoriatic Arthritis).  It is linked with heart disease, inflammatory bowel disease, non-alcoholic fatty liver disease, and depression. Significant skin psoriasis and/or psoriatic arthritis may have significant symptoms and affects activities  of daily activity and often benefits from systemic treatments.  These systemic treatments have some potential side effects including immunosuppression  and require pre-treatment laboratory screening and periodic laboratory monitoring and periodic in person evaluation and monitoring by the attending dermatologist physician (long term medication management).   Continue Skyrizi q 69mo  Related Medications Calcipotriene-Betameth Diprop (ENSTILAR) 0.005-0.064 % FOAM Qd to aa hands, feet, elbows until clear, then prn flares  tazarotene (AVAGE) 0.1 % cream Qhs to hands and feet prn psoriasis flares   Return for Psoriasis, as scheduled.  IGraciella Belton RMA, am acting as scribe for TBrendolyn Patty MD .  Documentation: I have reviewed the above documentation for accuracy and completeness, and I agree with the above.  TBrendolyn PattyMD

## 2022-09-24 NOTE — Patient Instructions (Signed)
Wound Care Instructions  Cleanse wound gently with soap and water once a day then pat dry with clean gauze. Apply a thin coat of Petrolatum (petroleum jelly, "Vaseline") over the wound (unless you have an allergy to this). We recommend that you use a new, sterile tube of Vaseline. Do not pick or remove scabs. Do not remove the yellow or white "healing tissue" from the base of the wound.  Cover the wound with fresh, clean, nonstick gauze and secure with paper tape. You may use Band-Aids in place of gauze and tape if the wound is small enough, but would recommend trimming much of the tape off as there is often too much. Sometimes Band-Aids can irritate the skin.  You should call the office for your biopsy report after 1 week if you have not already been contacted.  If you experience any problems, such as abnormal amounts of bleeding, swelling, significant bruising, significant pain, or evidence of infection, please call the office immediately.  FOR ADULT SURGERY PATIENTS: If you need something for pain relief you may take 1 extra strength Tylenol (acetaminophen) AND 2 Ibuprofen (200mg each) together every 4 hours as needed for pain. (do not take these if you are allergic to them or if you have a reason you should not take them.) Typically, you may only need pain medication for 1 to 3 days.     Due to recent changes in healthcare laws, you may see results of your pathology and/or laboratory studies on MyChart before the doctors have had a chance to review them. We understand that in some cases there may be results that are confusing or concerning to you. Please understand that not all results are received at the same time and often the doctors may need to interpret multiple results in order to provide you with the best plan of care or course of treatment. Therefore, we ask that you please give us 2 business days to thoroughly review all your results before contacting the office for clarification. Should  we see a critical lab result, you will be contacted sooner.   If You Need Anything After Your Visit  If you have any questions or concerns for your doctor, please call our main line at [phone] and press option 4 to reach your doctor's medical assistant. If no one answers, please leave a voicemail as directed and we will return your call as soon as possible. Messages left after 4 pm will be answered the following business day.   You may also send us a message via MyChart. We typically respond to MyChart messages within 1-2 business days.  For prescription refills, please ask your pharmacy to contact our office. Our fax number is [phone].  If you have an urgent issue when the clinic is closed that cannot wait until the next business day, you can page your doctor at the number below.    Please note that while we do our best to be available for urgent issues outside of office hours, we are not available 24/7.   If you have an urgent issue and are unable to reach us, you may choose to seek medical care at your doctor's office, retail clinic, urgent care center, or emergency room.  If you have a medical emergency, please immediately call 911 or go to the emergency department.  Pager Numbers  - Dr. Kowalski: [phone]  - Dr. Moye: [phone]  - Dr. Stewart: [phone]  In the event of inclement weather, please call our main line at   [phone] for an update on the status of any delays or closures.  Dermatology Medication Tips: Please keep the boxes that topical medications come in in order to help keep track of the instructions about where and how to use these. Pharmacies typically print the medication instructions only on the boxes and not directly on the medication tubes.   If your medication is too expensive, please contact our office at [phone] option 4 or send us a message through MyChart.   We are unable to tell what your co-pay for medications will be in  advance as this is different depending on your insurance coverage. However, we may be able to find a substitute medication at lower cost or fill out paperwork to get insurance to cover a needed medication.   If a prior authorization is required to get your medication covered by your insurance company, please allow us 1-2 business days to complete this process.  Drug prices often vary depending on where the prescription is filled and some pharmacies may offer cheaper prices.  The website www.goodrx.com contains coupons for medications through different pharmacies. The prices here do not account for what the cost may be with help from insurance (it may be cheaper with your insurance), but the website can give you the price if you did not use any insurance.  - You can print the associated coupon and take it with your prescription to the pharmacy.  - You may also stop by our office during regular business hours and pick up a GoodRx coupon card.  - If you need your prescription sent electronically to a different pharmacy, notify our office through [place] MyChart or by phone at [phone] option 4.     Si Usted Necesita Algo Despus de Su Visita  Tambin puede enviarnos un mensaje a travs de MyChart. Por lo general respondemos a los mensajes de MyChart en el transcurso de 1 a 2 das hbiles.  Para renovar recetas, por favor pida a su farmacia que se ponga en contacto con nuestra oficina. Nuestro nmero de fax es el [phone].  Si tiene un asunto urgente cuando la clnica est cerrada y que no puede esperar hasta el siguiente da hbil, puede llamar/localizar a su doctor(a) al nmero que aparece a continuacin.   Por favor, tenga en cuenta que aunque hacemos todo lo posible para estar disponibles para asuntos urgentes fuera del horario de oficina, no estamos disponibles las 24 horas del da, los 7 das de la semana.   Si tiene un problema urgente y no puede comunicarse con nosotros, puede  optar por buscar atencin mdica  en el consultorio de su doctor(a), en una clnica privada, en un centro de atencin urgente o en una sala de emergencias.  Si tiene una emergencia mdica, por favor llame inmediatamente al 911 o vaya a la sala de emergencias.  Nmeros de bper  - Dr. Kowalski: [phone]  - Dra. Moye: [phone]  - Dra. Stewart: [phone]  En caso de inclemencias del tiempo, por favor llame a nuestra lnea principal al [phone] para una actualizacin sobre el estado de cualquier retraso o cierre.  Consejos para la medicacin en dermatologa: Por favor, guarde las cajas en las que vienen los medicamentos de uso tpico para ayudarle a seguir las instrucciones sobre dnde y cmo usarlos. Las farmacias generalmente imprimen las instrucciones del medicamento slo en las cajas y no directamente en los tubos del medicamento.   Si su medicamento es muy caro, por favor, pngase en contacto con   nuestra oficina llamando al [phone] y presione la opcin 4 o envenos un mensaje a travs de MyChart.   No podemos decirle cul ser su copago por los medicamentos por adelantado ya que esto es diferente dependiendo de la cobertura de su seguro. Sin embargo, es posible que podamos encontrar un medicamento sustituto a menor costo o llenar un formulario para que el seguro cubra el medicamento que se considera necesario.   Si se requiere una autorizacin previa para que su compaa de seguros cubra su medicamento, por favor permtanos de 1 a 2 das hbiles para completar este proceso.  Los precios de los medicamentos varan con frecuencia dependiendo del lugar de dnde se surte la receta y alguna farmacias pueden ofrecer precios ms baratos.  El sitio web www.goodrx.com tiene cupones para medicamentos de diferentes farmacias. Los precios aqu no tienen en cuenta lo que podra costar con la ayuda del seguro (puede ser ms barato con su seguro), pero el sitio web puede darle el  precio si no utiliz ningn seguro.  - Puede imprimir el cupn correspondiente y llevarlo con su receta a la farmacia.  - Tambin puede pasar por nuestra oficina durante el horario de atencin regular y recoger una tarjeta de cupones de GoodRx.  - Si necesita que su receta se enve electrnicamente a una farmacia diferente, informe a nuestra oficina a travs de MyChart de [place] o por telfono llamando al [phone] y presione la opcin 4.  

## 2022-09-25 DIAGNOSIS — M9905 Segmental and somatic dysfunction of pelvic region: Secondary | ICD-10-CM | POA: Diagnosis not present

## 2022-09-25 DIAGNOSIS — M9903 Segmental and somatic dysfunction of lumbar region: Secondary | ICD-10-CM | POA: Diagnosis not present

## 2022-09-25 DIAGNOSIS — M955 Acquired deformity of pelvis: Secondary | ICD-10-CM | POA: Diagnosis not present

## 2022-09-25 DIAGNOSIS — M62830 Muscle spasm of back: Secondary | ICD-10-CM | POA: Diagnosis not present

## 2022-10-01 ENCOUNTER — Encounter: Payer: Self-pay | Admitting: Internal Medicine

## 2022-10-01 ENCOUNTER — Telehealth: Payer: Self-pay

## 2022-10-01 NOTE — Telephone Encounter (Signed)
Advised pt of bx results/sh ?

## 2022-10-01 NOTE — Telephone Encounter (Signed)
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

## 2022-10-07 ENCOUNTER — Other Ambulatory Visit: Payer: Self-pay | Admitting: Internal Medicine

## 2022-10-07 DIAGNOSIS — F4321 Adjustment disorder with depressed mood: Secondary | ICD-10-CM

## 2022-10-07 DIAGNOSIS — F4320 Adjustment disorder, unspecified: Secondary | ICD-10-CM | POA: Insufficient documentation

## 2022-10-07 MED ORDER — LORAZEPAM 0.5 MG PO TABS: 0.5000 mg | ORAL_TABLET | Freq: Two times a day (BID) | ORAL | 0 refills | Status: DC | PRN

## 2022-10-07 NOTE — Assessment & Plan Note (Signed)
Secondary to unexpected loss of mother.  Lorazepam prescribed

## 2022-10-17 ENCOUNTER — Other Ambulatory Visit: Payer: Self-pay | Admitting: Family

## 2022-10-17 ENCOUNTER — Other Ambulatory Visit: Payer: Self-pay | Admitting: Obstetrics and Gynecology

## 2022-10-17 DIAGNOSIS — N951 Menopausal and female climacteric states: Secondary | ICD-10-CM

## 2022-10-28 DIAGNOSIS — M62830 Muscle spasm of back: Secondary | ICD-10-CM | POA: Diagnosis not present

## 2022-10-28 DIAGNOSIS — M9903 Segmental and somatic dysfunction of lumbar region: Secondary | ICD-10-CM | POA: Diagnosis not present

## 2022-10-28 DIAGNOSIS — M9905 Segmental and somatic dysfunction of pelvic region: Secondary | ICD-10-CM | POA: Diagnosis not present

## 2022-10-28 DIAGNOSIS — M955 Acquired deformity of pelvis: Secondary | ICD-10-CM | POA: Diagnosis not present

## 2022-11-04 ENCOUNTER — Other Ambulatory Visit: Payer: Self-pay | Admitting: Internal Medicine

## 2022-11-05 NOTE — Telephone Encounter (Signed)
Hyperlipidemia: LDL suboptimally controlled on last check 2 weeks ago and higher than on prior assay.  Will increase atorvastatin to 80 mg daily with plans to recheck a lipid panel and ALT in about 3 months.  Lifestyle modifications also reinforced.  Patient has not had 3 month lab check and refill request is for .  Please advise of refill request.

## 2022-11-10 ENCOUNTER — Encounter: Payer: Self-pay | Admitting: Internal Medicine

## 2022-11-14 ENCOUNTER — Other Ambulatory Visit (HOSPITAL_COMMUNITY): Payer: Self-pay

## 2022-11-14 ENCOUNTER — Telehealth: Payer: Self-pay | Admitting: Pharmacy Technician

## 2022-11-14 NOTE — Telephone Encounter (Signed)
Per request for a PA, ran a test claim. Our system will only allow a 30 day supply for $10 due to contracting. Called [facility] where the rx was sent and he was able to fill the 90 day supply for $20. Relayed msg back to office.

## 2022-11-15 NOTE — Telephone Encounter (Signed)
Spoke with pt to let her know of the message below.

## 2022-11-25 ENCOUNTER — Ambulatory Visit: Payer: BC Managed Care – PPO | Admitting: Internal Medicine

## 2022-11-25 ENCOUNTER — Encounter: Payer: Self-pay | Admitting: Internal Medicine

## 2022-11-25 VITALS — BP 110/72 | HR 98 | Temp 97.4°F | Ht 67.5 in | Wt 207.0 lb

## 2022-11-25 DIAGNOSIS — R635 Abnormal weight gain: Secondary | ICD-10-CM

## 2022-11-25 DIAGNOSIS — M9905 Segmental and somatic dysfunction of pelvic region: Secondary | ICD-10-CM | POA: Diagnosis not present

## 2022-11-25 DIAGNOSIS — Z636 Dependent relative needing care at home: Secondary | ICD-10-CM | POA: Insufficient documentation

## 2022-11-25 DIAGNOSIS — M9903 Segmental and somatic dysfunction of lumbar region: Secondary | ICD-10-CM | POA: Diagnosis not present

## 2022-11-25 DIAGNOSIS — E7800 Pure hypercholesterolemia, unspecified: Secondary | ICD-10-CM

## 2022-11-25 DIAGNOSIS — R053 Chronic cough: Secondary | ICD-10-CM

## 2022-11-25 DIAGNOSIS — M955 Acquired deformity of pelvis: Secondary | ICD-10-CM | POA: Diagnosis not present

## 2022-11-25 DIAGNOSIS — E041 Nontoxic single thyroid nodule: Secondary | ICD-10-CM | POA: Diagnosis not present

## 2022-11-25 DIAGNOSIS — F331 Major depressive disorder, recurrent, moderate: Secondary | ICD-10-CM

## 2022-11-25 DIAGNOSIS — M62830 Muscle spasm of back: Secondary | ICD-10-CM | POA: Diagnosis not present

## 2022-11-25 DIAGNOSIS — K449 Diaphragmatic hernia without obstruction or gangrene: Secondary | ICD-10-CM

## 2022-11-25 DIAGNOSIS — R0609 Other forms of dyspnea: Secondary | ICD-10-CM

## 2022-11-25 DIAGNOSIS — Z1231 Encounter for screening mammogram for malignant neoplasm of breast: Secondary | ICD-10-CM

## 2022-11-25 LAB — COMPREHENSIVE METABOLIC PANEL
ALT: 13 U/L (ref 0–35)
AST: 14 U/L (ref 0–37)
Albumin: 4.1 g/dL (ref 3.5–5.2)
Alkaline Phosphatase: 78 U/L (ref 39–117)
BUN: 12 mg/dL (ref 6–23)
CO2: 28 mEq/L (ref 19–32)
Calcium: 9.7 mg/dL (ref 8.4–10.5)
Chloride: 104 mEq/L (ref 96–112)
Creatinine, Ser: 0.85 mg/dL (ref 0.40–1.20)
GFR: 74.35 mL/min (ref >60.00)
Glucose, Bld: 102 mg/dL — ABNORMAL HIGH (ref 70–99)
Potassium: 4.1 mEq/L (ref 3.5–5.1)
Sodium: 140 mEq/L (ref 135–145)
Total Bilirubin: 0.5 mg/dL (ref 0.2–1.2)
Total Protein: 6.9 g/dL (ref 6.0–8.3)

## 2022-11-25 LAB — LIPID PANEL
Cholesterol: 172 mg/dL (ref 0–200)
HDL: 45.6 mg/dL (ref >39.00)
LDL Cholesterol: 89 mg/dL (ref 0–99)
NonHDL: 126.54
Total CHOL/HDL Ratio: 4
Triglycerides: 187 mg/dL — ABNORMAL HIGH (ref 0.0–149.0)
VLDL: 37.4 mg/dL (ref 0.0–40.0)

## 2022-11-25 LAB — TSH: TSH: 1.58 u[IU]/mL (ref 0.35–5.50)

## 2022-11-25 MED ORDER — BUSPIRONE HCL 15 MG PO TABS: 15.0000 mg | ORAL_TABLET | Freq: Two times a day (BID) | ORAL | 2 refills | Status: DC | PRN

## 2022-11-25 MED ORDER — PANTOPRAZOLE SODIUM 40 MG PO TBEC: 40.0000 mg | DELAYED_RELEASE_TABLET | Freq: Two times a day (BID) | ORAL | 0 refills | Status: DC

## 2022-11-25 NOTE — Progress Notes (Signed)
Subjective:  Patient ID: Colleen Washington, female    DOB: 1961/06/21  Age: 62 y.o. MRN: [id number]  CC: The primary encounter diagnosis was Encounter for screening mammogram for malignant neoplasm of breast. Diagnoses of Thyroid nodule, Pure hypercholesterolemia, Chronic cough, Weight gain, Dyspnea on exertion, Esophageal hiatal hernia, Moderate episode of recurrent major depressive disorder (HCC), and Caregiver stress were also pertinent to this visit.   HPI MISCHELL LANGER presents for  Chief Complaint  Patient presents with   Medical Management of Chronic Issues    1) GERD: Last seen in June virtually.  GERD symptoms uncontrolled,  hiatal hernia.  Protonix increased to bid and wedge pillow recommended . Has not been taking protonix as directed due to mother's death in early Nov 04, 2022 and her increased responsibilities caring for step father   2) Chronic nonproductive cough mostly at night when supine.  Sleeping on a wedge. Taking zyrtec   3) Depression:  aggravated  by mother 's death .  Now taking care of stepfather who is helpless since  Mom died.  She wants to wean off of wellbutrin ,"IT'S MAKING ME SLEEPY"   4) Obesity  : tried contrave,  tried phentermine,  "didn't work "  not exercising   5) stress incontinence  for 6 months  didn't try the estrogen prescribed by GYN  6) GAD:  buspirone makes her feel weird .      7) Thyroid nodule overdue for follow up    Outpatient Medications Prior to Visit  Medication Sig Dispense Refill   albuterol (VENTOLIN HFA) 108 (90 Base) MCG/ACT inhaler INHALE 1-2 PUFFS INTO THE LUNGS EVERY 4 HOURS AS NEEDED FOR WHEEZING OR SHORTNESS OF BREATH. 18 each 2   aspirin EC 81 MG tablet Take 1 tablet (81 mg total) by mouth daily. Swallow whole.     atorvastatin (LIPITOR) 80 MG tablet Take 1 tablet (80 mg total) by mouth daily. 90 tablet 3   buPROPion (WELLBUTRIN XL) 300 MG 24 hr tablet TAKE 1 TABLET BY MOUTH EVERY DAY 90 tablet 1   Calcipotriene-Betameth Diprop  (ENSTILAR) 0.005-0.064 % FOAM Qd to aa hands, feet, elbows until clear, then prn flares 60 g 6   cetirizine (ZYRTEC ALLERGY) 10 MG tablet Take 1 tablet (10 mg total) by mouth daily. 90 tablet 1   fluticasone (FLONASE) 50 MCG/ACT nasal spray Place 2 sprays into both nostrils daily. 16 g 6   fluticasone-salmeterol (ADVAIR) 100-50 MCG/ACT AEPB Inhale 1 puff into the lungs 2 (two) times daily. 60 each 0   LORazepam (ATIVAN) 0.5 MG tablet Take 1 tablet (0.5 mg total) by mouth 2 (two) times daily as needed for anxiety. 30 tablet 0   Multiple Vitamins-Minerals (AIRBORNE PO) Take by mouth.     Multiple Vitamins-Minerals (ALIVE MULTI-VITAMIN PO) Take by mouth.     promethazine (PHENERGAN) 12.5 MG tablet TAKE 1 TABLET (12.5 MG TOTAL) BY MOUTH EVERY 4 HOURS AS NEEDED FOR NAUSEA AND VOMITING 30 tablet 0   propranolol (INDERAL) 10 MG tablet TAKE 1 TABLET (10 MG TOTAL) BY MOUTH 3 (THREE) TIMES DAILY. AS NEEDED FOR RAPID HEART RATE 30 tablet 0   Risankizumab-rzaa (SKYRIZI PEN) 150 MG/ML SOAJ Inject 150 mg into the skin as directed. Every 12 weeks for maintenance. 1 mL 2   tazarotene (AVAGE) 0.1 % cream Qhs to hands and feet prn psoriasis flares 60 g 6   busPIRone (BUSPAR) 15 MG tablet TAKE 1 TABLET (15 MG TOTAL) BY MOUTH 2 (TWO) TIMES DAILY  AS NEEDED. 180 tablet 2   pantoprazole (PROTONIX) 40 MG tablet TAKE 1 TABLET (40 MG TOTAL) BY MOUTH 2 (TWO) TIMES DAILY. 30 MINUTES BEFORE MEALS 180 tablet 0   amoxicillin-clavulanate (AUGMENTIN) 875-125 MG tablet Take 1 tablet by mouth 2 (two) times daily. (Patient not taking: Reported on 11/25/2022) 14 tablet 0   atorvastatin (LIPITOR) 80 MG tablet Take 1 tablet (80 mg total) by mouth daily. (Patient not taking: Reported on 11/25/2022) 90 tablet 3   hydrOXYzine (VISTARIL) 25 MG capsule Take 1 capsule (25 mg total) by mouth every 8 (eight) hours as needed. 60 capsule 0   No facility-administered medications prior to visit.    Review of Systems;  Patient denies headache,  fevers, malaise, unintentional weight loss, skin rash, eye pain, sinus congestion and sinus pain, sore throat, dysphagia,  hemoptysis , cough, dyspnea, wheezing, chest pain, palpitations, orthopnea, edema, abdominal pain, nausea, melena, diarrhea, constipation, flank pain, dysuria, hematuria, urinary  Frequency, nocturia, numbness, tingling, seizures,  Focal weakness, Loss of consciousness,  Tremor, insomnia, depression, anxiety, and suicidal ideation.      Objective:  BP 110/72   Pulse 98   Temp (!) 97.4 F (36.3 C) (Oral)   Ht 5' 7.5" (1.715 m)   Wt 207 lb (93.9 kg)   SpO2 95%   BMI 31.94 kg/m   BP Readings from Last 3 Encounters:  11/25/22 110/72  08/26/22 (!) 142/90  07/31/22 130/88    Wt Readings from Last 3 Encounters:  11/25/22 207 lb (93.9 kg)  07/31/22 200 lb 3.2 oz (90.8 kg)  07/19/22 201 lb 3.2 oz (91.3 kg)    Physical Exam Vitals reviewed.  Constitutional:      General: She is not in acute distress.    Appearance: Normal appearance. She is normal weight. She is not ill-appearing, toxic-appearing or diaphoretic.  HENT:     Head: Normocephalic.  Eyes:     General: No scleral icterus.       Right eye: No discharge.        Left eye: No discharge.     Conjunctiva/sclera: Conjunctivae normal.  Cardiovascular:     Rate and Rhythm: Normal rate and regular rhythm.     Heart sounds: Normal heart sounds.  Pulmonary:     Effort: Pulmonary effort is normal. No respiratory distress.     Breath sounds: Normal breath sounds.  Musculoskeletal:        General: Normal range of motion.  Skin:    General: Skin is warm and dry.  Neurological:     General: No focal deficit present.     Mental Status: She is alert and oriented to person, place, and time. Mental status is at baseline.  Psychiatric:        Mood and Affect: Mood normal.        Behavior: Behavior normal.        Thought Content: Thought content normal.        Judgment: Judgment normal.    Lab Results   Component Value Date   HGBA1C 5.6 07/19/2022   HGBA1C 5.5 12/20/2021   HGBA1C 5.2 10/14/2016    Lab Results  Component Value Date   CREATININE 0.85 11/25/2022   CREATININE 0.83 07/19/2022   CREATININE 0.80 12/20/2021    Lab Results  Component Value Date   WBC 4.8 07/19/2022   HGB 13.8 07/19/2022   HCT 41.2 07/19/2022   PLT 187 07/19/2022   GLUCOSE 102 (H) 11/25/2022   CHOL 172 11/25/2022  TRIG 187.0 (H) 11/25/2022   HDL 45.60 11/25/2022   LDLCALC 89 11/25/2022   ALT 13 11/25/2022   AST 14 11/25/2022   NA 140 11/25/2022   K 4.1 11/25/2022   CL 104 11/25/2022   CREATININE 0.85 11/25/2022   BUN 12 11/25/2022   CO2 28 11/25/2022   TSH 1.58 11/25/2022   HGBA1C 5.6 07/19/2022    No results found.  Assessment & Plan:  .Encounter for screening mammogram for malignant neoplasm of breast -     3D Screening Mammogram, Left and Right; Future  Thyroid nodule Assessment & Plan: She is overdue for annual surveillance by ultrasound   Orders: -     TSH -     US THYROID; Future  Pure hypercholesterolemia -     Lipid panel -     Comprehensive metabolic panel  Chronic cough -     DG Chest 2 View; Future  Weight gain Assessment & Plan: Secondary to tobacco cessation.  She has had difficulty losing weight due to increased appetite abuthas not lost weight with prior trial of Contrave and Phentermine.     Dyspnea on exertion Assessment & Plan: Deconditioning suggested    Esophageal hiatal hernia Assessment & Plan: With prolematic nocturnal cough.  Increase protonix to bid.    Moderate episode of recurrent major depressive disorder [facility]) Assessment & Plan: She is requesting a wean from welbutrin due to increased aggressiveness    Caregiver stress Assessment & Plan: Counselled about he assuming responsibility for her stepfather following her mother's death   Other orders -     busPIRone HCl; Take 1 tablet (15 mg total) by mouth 2 (two) times daily as  needed.  Dispense: 180 tablet; Refill: 2 -     Pantoprazole Sodium; Take 1 tablet (40 mg total) by mouth 2 (two) times daily. 30 minutes before meals  Dispense: 180 tablet; Refill: 0     I provided 30 minutes of face-to-face time during this encounter reviewing patient's last visit with me, previous  labs and imaging studies, counseling on currently addressed domestic issues,  and post visit ordering to diagnostics and therapeutics .   Follow-up: Return in about 3 months (around 02/25/2023).   Sherlene Shams, MD

## 2022-11-25 NOTE — Assessment & Plan Note (Signed)
With prolematic nocturnal cough.  Increase protonix to bid.

## 2022-11-25 NOTE — Assessment & Plan Note (Signed)
Deconditioning suggested

## 2022-11-25 NOTE — Assessment & Plan Note (Addendum)
She is requesting a wean from welbutrin due to increased aggressiveness

## 2022-11-25 NOTE — Assessment & Plan Note (Signed)
Counselled about he assuming responsibility for her stepfather following her mother's death

## 2022-11-25 NOTE — Patient Instructions (Addendum)
The cough is likely due to GERD or allergies.   Please Take the protonix  TWICE DAILY !  no more famotidine.   Increase zyrtec to twice daily    Wellbutrin should be weaned off as follows: REDUCE WELLBUTRIN TO EVERY OTHER DAY FOR 2 WEEKS,   THEN EVERY 2 DAYS  FOR 2 WEEKS ,   THEN STOP   For the urinary Stress incontinence:  practice Kegel exercises every time you urinate .  Resume estrogen     Your annual mammogram has been ordered AND IS DUE I now   [facility] will not allow [place] to schedule it for you,  so please  call to make your appointment [phone]    Thyroid ultrasound and chest x ray ordered

## 2022-11-25 NOTE — Assessment & Plan Note (Signed)
She is overdue for annual surveillance by ultrasound

## 2022-11-25 NOTE — Assessment & Plan Note (Signed)
Secondary to tobacco cessation.  She has had difficulty losing weight due to increased appetite abuthas not lost weight with prior trial of Contrave and Phentermine.

## 2022-11-29 ENCOUNTER — Ambulatory Visit
Admission: RE | Admit: 2022-11-29 | Discharge: 2022-11-29 | Disposition: A | Discharge status: Home / Self Care | Payer: BC Managed Care – PPO | Source: Ambulatory Visit | Attending: Internal Medicine | Admitting: Internal Medicine

## 2022-11-29 DIAGNOSIS — E041 Nontoxic single thyroid nodule: Secondary | ICD-10-CM

## 2022-11-30 NOTE — Assessment & Plan Note (Signed)
Repeat ultrasound May 2024 done:  no follow up needed

## 2022-12-04 DIAGNOSIS — M9903 Segmental and somatic dysfunction of lumbar region: Secondary | ICD-10-CM | POA: Diagnosis not present

## 2022-12-04 DIAGNOSIS — M62830 Muscle spasm of back: Secondary | ICD-10-CM | POA: Diagnosis not present

## 2022-12-04 DIAGNOSIS — M955 Acquired deformity of pelvis: Secondary | ICD-10-CM | POA: Diagnosis not present

## 2022-12-04 DIAGNOSIS — M9905 Segmental and somatic dysfunction of pelvic region: Secondary | ICD-10-CM | POA: Diagnosis not present

## 2022-12-23 DIAGNOSIS — M955 Acquired deformity of pelvis: Secondary | ICD-10-CM | POA: Diagnosis not present

## 2022-12-23 DIAGNOSIS — M62830 Muscle spasm of back: Secondary | ICD-10-CM | POA: Diagnosis not present

## 2022-12-23 DIAGNOSIS — M9905 Segmental and somatic dysfunction of pelvic region: Secondary | ICD-10-CM | POA: Diagnosis not present

## 2022-12-23 DIAGNOSIS — M9903 Segmental and somatic dysfunction of lumbar region: Secondary | ICD-10-CM | POA: Diagnosis not present

## 2023-01-20 DIAGNOSIS — M62830 Muscle spasm of back: Secondary | ICD-10-CM | POA: Diagnosis not present

## 2023-01-20 DIAGNOSIS — M9903 Segmental and somatic dysfunction of lumbar region: Secondary | ICD-10-CM | POA: Diagnosis not present

## 2023-01-20 DIAGNOSIS — M955 Acquired deformity of pelvis: Secondary | ICD-10-CM | POA: Diagnosis not present

## 2023-01-20 DIAGNOSIS — M9905 Segmental and somatic dysfunction of pelvic region: Secondary | ICD-10-CM | POA: Diagnosis not present

## 2023-01-23 ENCOUNTER — Other Ambulatory Visit: Payer: Self-pay | Admitting: Oncology

## 2023-01-23 DIAGNOSIS — Z006 Encounter for examination for normal comparison and control in clinical research program: Secondary | ICD-10-CM

## 2023-01-28 ENCOUNTER — Ambulatory Visit
Admission: RE | Admit: 2023-01-28 | Discharge: 2023-01-28 | Disposition: A | Discharge status: Home / Self Care | Payer: PRIVATE HEALTH INSURANCE | Source: Ambulatory Visit | Attending: Physician Assistant | Admitting: Physician Assistant

## 2023-01-28 ENCOUNTER — Other Ambulatory Visit: Payer: Self-pay | Admitting: Physician Assistant

## 2023-01-28 DIAGNOSIS — S8992XA Unspecified injury of left lower leg, initial encounter: Secondary | ICD-10-CM | POA: Diagnosis present

## 2023-01-30 DIAGNOSIS — A499 Bacterial infection, unspecified: Secondary | ICD-10-CM | POA: Diagnosis not present

## 2023-01-30 DIAGNOSIS — L608 Other nail disorders: Secondary | ICD-10-CM | POA: Diagnosis not present

## 2023-02-03 ENCOUNTER — Ambulatory Visit
Admission: EM | Admit: 2023-02-03 | Discharge: 2023-02-03 | Disposition: A | Discharge status: Home / Self Care | Payer: BC Managed Care – PPO | Attending: Emergency Medicine | Admitting: Emergency Medicine

## 2023-02-03 ENCOUNTER — Encounter: Payer: Self-pay | Admitting: Internal Medicine

## 2023-02-03 DIAGNOSIS — R1030 Lower abdominal pain, unspecified: Secondary | ICD-10-CM

## 2023-02-03 DIAGNOSIS — R300 Dysuria: Secondary | ICD-10-CM | POA: Diagnosis not present

## 2023-02-03 DIAGNOSIS — R350 Frequency of micturition: Secondary | ICD-10-CM | POA: Diagnosis not present

## 2023-02-03 DIAGNOSIS — R1032 Left lower quadrant pain: Secondary | ICD-10-CM

## 2023-02-03 DIAGNOSIS — M545 Low back pain, unspecified: Secondary | ICD-10-CM | POA: Diagnosis not present

## 2023-02-03 LAB — POCT URINALYSIS DIP (MANUAL ENTRY)
Bilirubin, UA: NEGATIVE
Glucose, UA: NEGATIVE mg/dL
Ketones, POC UA: NEGATIVE mg/dL
Leukocytes, UA: NEGATIVE
Nitrite, UA: NEGATIVE
Protein Ur, POC: NEGATIVE mg/dL
Spec Grav, UA: 1.01 (ref 1.010–1.025)
Urobilinogen, UA: 0.2 E.U./dL
pH, UA: 6 (ref 5.0–8.0)

## 2023-02-03 NOTE — ED Provider Notes (Signed)
Renaldo Fiddler    CSN: [id number] Arrival date & time: 02/03/23  1344      History   Chief Complaint Chief Complaint  Patient presents with   Fever   Back Pain    HPI Colleen Washington is a 62 y.o. female.  Patient presents with 1 day history of low-grade fever, chills, lower abdominal discomfort, low back pain, urinary frequency.  Tmax 99.2.  No OTC medications taken today.  She denies abdominal pain, hematuria, vomiting, diarrhea, constipation, or other symptoms.  Her medical history includes diverticulitis, GERD, IBS, hypertension, endometriosis, hysterectomy.  She reports remote history of kidney stone.  The history is provided by the patient and medical records.    Past Medical History:  Diagnosis Date   allergic rhinitis    Anxiety    AR (allergic rhinitis)    Asthma    Depression    Endometriosis    GERD (gastroesophageal reflux disease)    H/O migraine    Herniated intervertebral disc    History of diverticulitis of colon    Hyperlipidemia    Hypertension    IBS (irritable bowel syndrome)    Irritable bowel syndrome    Nonobstructive CAD (coronary artery disease)    a. 11/2019 Cor CTA: Cor Ca2+ = 141 (95th %'ile). LAD 25-49p.   Psoriasis    Tobacco abuse     Patient Active Problem List   Diagnosis Date Noted   Caregiver stress 11/25/2022   Grief reaction 10/07/2022   Esophageal hiatal hernia 12/18/2021   Coronary artery disease involving native coronary artery of native heart without angina pectoris 05/18/2021   Leg edema 05/18/2021   Moderate episode of recurrent major depressive disorder (HCC) 10/29/2020   Right arm pain 10/03/2020   History of COVID-19 07/28/2020   Chest pain of uncertain etiology 10/22/2019   Dyspnea on exertion 10/22/2019   Palpitations 10/22/2019   PAC (premature atrial contraction) 10/22/2019   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   SVT (supraventricular tachycardia) 08/15/2018   Thyroid nodule 10/28/2016   Ovarian  cyst, right 09/30/2015   Endometriosis 08/10/2015   Status post vaginal hysterectomy 08/10/2015   Anxiety 08/10/2015   Obesity (BMI 30.0-34.9) 08/10/2015   Vitamin D deficiency 05/26/2015   Weight gain 02/22/2015   Menopause 09/02/2014   Visit for preventive health examination 08/24/2013   Snoring 08/24/2013   Symptoms, such as flushing, sleeplessness, headache, lack of concentration, associated with the menopause 08/24/2013   Encounter for drug screening 08/13/2013   Migraine headache 08/26/2012   Panic disorder without agoraphobia with mild panic attacks 08/26/2012   Bronchitis, chronic (HCC) 05/09/2012   History of diverticulitis of colon    GERD (gastroesophageal reflux disease)    Hyperlipidemia    Irritable bowel syndrome    Essential hypertension    Herniated intervertebral disc    Tobacco use 03/04/2012    Past Surgical History:  Procedure Laterality Date   ABDOMINAL HYSTERECTOMY  2003   tvhuso   BREAST BIOPSY Left 08/2014   benign-BENIGN BREAST TISSUE WITH FIBROSIS   DILATION AND CURETTAGE OF UTERUS      OB History     Gravida  3   Para  2   Term  2   Preterm      AB  1   Living  2      SAB  1   IAB      Ectopic      Multiple      Live Births  2  Home Medications    Prior to Admission medications   Medication Sig Start Date End Date Taking? Authorizing Provider  albuterol (VENTOLIN HFA) 108 (90 Base) MCG/ACT inhaler INHALE 1-2 PUFFS INTO THE LUNGS EVERY 4 HOURS AS NEEDED FOR WHEEZING OR SHORTNESS OF BREATH. 01/22/21   Sherlene Shams, MD  aspirin EC 81 MG tablet Take 1 tablet (81 mg total) by mouth daily. Swallow whole. 05/17/21   End, Cristal Deer, MD  atorvastatin (LIPITOR) 80 MG tablet Take 1 tablet (80 mg total) by mouth daily. 11/05/22   End, Cristal Deer, MD  buPROPion (WELLBUTRIN XL) 300 MG 24 hr tablet TAKE 1 TABLET BY MOUTH EVERY DAY Patient not taking: Reported on 02/03/2023 11/05/22   Sherlene Shams, MD  busPIRone  (BUSPAR) 15 MG tablet Take 1 tablet (15 mg total) by mouth 2 (two) times daily as needed. 11/25/22   Sherlene Shams, MD  Calcipotriene-Betameth Diprop (ENSTILAR) 0.005-0.064 % FOAM Qd to aa hands, feet, elbows until clear, then prn flares 02/04/22   Willeen Niece, MD  cetirizine (ZYRTEC ALLERGY) 10 MG tablet Take 1 tablet (10 mg total) by mouth daily. 09/15/22   Junie Spencer, FNP  fluticasone (FLONASE) 50 MCG/ACT nasal spray Place 2 sprays into both nostrils daily. 09/15/22   Jannifer Rodney A, FNP  fluticasone-salmeterol (ADVAIR) 100-50 MCG/ACT AEPB Inhale 1 puff into the lungs 2 (two) times daily. 11/28/21   Sherlene Shams, MD  LORazepam (ATIVAN) 0.5 MG tablet Take 1 tablet (0.5 mg total) by mouth 2 (two) times daily as needed for anxiety. 10/07/22   Sherlene Shams, MD  Multiple Vitamins-Minerals (AIRBORNE PO) Take by mouth.    [provider]  Multiple Vitamins-Minerals (ALIVE MULTI-VITAMIN PO) Take by mouth.    [provider]  pantoprazole (PROTONIX) 40 MG tablet Take 1 tablet (40 mg total) by mouth 2 (two) times daily. 30 minutes before meals 11/25/22   Sherlene Shams, MD  promethazine (PHENERGAN) 12.5 MG tablet TAKE 1 TABLET (12.5 MG TOTAL) BY MOUTH EVERY 4 HOURS AS NEEDED FOR NAUSEA AND VOMITING 11/05/22   Sherlene Shams, MD  propranolol (INDERAL) 10 MG tablet TAKE 1 TABLET (10 MG TOTAL) BY MOUTH 3 (THREE) TIMES DAILY. AS NEEDED FOR RAPID HEART RATE 06/23/19   Sherlene Shams, MD  Risankizumab-rzaa (SKYRIZI PEN) 150 MG/ML SOAJ Inject 150 mg into the skin as directed. Every 12 weeks for maintenance. 08/19/22   Willeen Niece, MD  tazarotene (AVAGE) 0.1 % cream Qhs to hands and feet prn psoriasis flares 02/04/22   Willeen Niece, MD    Family History Family History  Problem Relation Age of Onset   Arthritis Mother    Hyperlipidemia Mother    Cancer Father        colon   Diabetes Brother    Breast cancer Paternal Aunt 26   Arthritis Maternal Grandmother    Hyperlipidemia  Maternal Grandfather    Heart disease Neg Hx    Colon cancer Neg Hx     Social History Social History   Tobacco Use   Smoking status: Some Days    Current packs/day: 0.00    Average packs/day: 0.3 packs/day for 20.0 years (5.0 ttl pk-yrs)    Types: Cigarettes    Start date: 11/14/2001    Last attempt to quit: 11/14/2021    Years since quitting: 1.2   Smokeless tobacco: Never   Tobacco comments:    2-3 cigarettes daily   Vaping Use   Vaping status: Never Used  Substance Use Topics   Alcohol use: Yes    Alcohol/week: 0.0 standard drinks of alcohol    Comment: occas   Drug use: No     Allergies   Compazine [prochlorperazine]   Review of Systems Review of Systems  Constitutional:  Positive for chills. Negative for fever.  Gastrointestinal:  Positive for abdominal pain. Negative for constipation, diarrhea, nausea and vomiting.  Genitourinary:  Positive for dysuria and frequency. Negative for flank pain and hematuria.  Musculoskeletal:  Positive for back pain. Negative for gait problem and joint swelling.     Physical Exam Triage Vital Signs ED Triage Vitals  Encounter Vitals Group     BP 02/03/23 1440 126/85     Systolic BP Percentile --      Diastolic BP Percentile --      Pulse Rate 02/03/23 1440 74     Resp 02/03/23 1440 18     Temp 02/03/23 1440 98.1 F (36.7 C)     Temp src --      SpO2 02/03/23 1440 98 %     Weight --      Height --      Head Circumference --      Peak Flow --      Pain Score 02/03/23 1435 0     Pain Loc --      Pain Education --      Exclude from Growth Chart --    No data found.  Updated Vital Signs BP 126/85   Pulse 74   Temp 98.1 F (36.7 C)   Resp 18   SpO2 98%   Visual Acuity Right Eye Distance:   Left Eye Distance:   Bilateral Distance:    Right Eye Near:   Left Eye Near:    Bilateral Near:     Physical Exam Vitals and nursing note reviewed.  Constitutional:      General: She is not in acute distress.     Appearance: She is well-developed.  HENT:     Mouth/Throat:     Mouth: Mucous membranes are moist.  Cardiovascular:     Rate and Rhythm: Normal rate and regular rhythm.     Heart sounds: Normal heart sounds.  Pulmonary:     Effort: Pulmonary effort is normal. No respiratory distress.     Breath sounds: Normal breath sounds.  Abdominal:     General: Bowel sounds are normal.     Palpations: Abdomen is soft.     Tenderness: There is no abdominal tenderness. There is no right CVA tenderness, left CVA tenderness, guarding or rebound.  Musculoskeletal:     Cervical back: Neck supple.  Skin:    General: Skin is warm and dry.  Neurological:     Mental Status: She is alert.  Psychiatric:        Mood and Affect: Mood normal.        Behavior: Behavior normal.      UC Treatments / Results  Labs (all labs ordered are listed, but only abnormal results are displayed) Labs Reviewed  POCT URINALYSIS DIP (MANUAL ENTRY) - Abnormal; Notable for the following components:      Result Value   Blood, UA trace-intact (*)    All other components within normal limits    EKG   Radiology No results found.  Procedures Procedures (including critical care time)  Medications Ordered in UC Medications - No data to display  Initial Impression / Assessment and Plan / UC Course  I  have reviewed the triage vital signs and the nursing notes.  Pertinent labs & imaging results that were available during my care of the patient were reviewed by me and considered in my medical decision making (see chart for details).    Dysuria, urinary frequency, low back pain, lower abdominal pain.  Vital signs are stable.  Abdomen is soft and nontender with good bowel sounds.  Urine does not indicate infection.  Instructed patient to follow-up with her PCP.  ED precautions given.  Education provided on dysuria, back pain, abdominal pain.  She agrees to plan of care.  Final Clinical Impressions(s) / UC Diagnoses    Final diagnoses:  Dysuria  Urinary frequency  Acute bilateral low back pain without sciatica  Lower abdominal pain     Discharge Instructions      Follow up with your primary care provider if your symptoms are not improving.    Go to the emergency department if you have persistent or worsening symptoms.     ED Prescriptions   None    PDMP not reviewed this encounter.   Mickie Bail, NP 02/03/23 [phone]

## 2023-02-03 NOTE — ED Triage Notes (Signed)
Patient to Urgent Care with complaints of fevers and cold chills/ back pain/ urinary frequency/ suprapubic pressure.  Symptoms started yesterday.

## 2023-02-03 NOTE — Discharge Instructions (Addendum)
Follow up with your primary care provider if your symptoms are not improving.    Go to the emergency department if you have persistent or worsening symptoms.

## 2023-02-04 ENCOUNTER — Ambulatory Visit: Payer: Self-pay

## 2023-02-04 NOTE — Telephone Encounter (Signed)
Pt was seen at UC yesterday.

## 2023-02-06 ENCOUNTER — Ambulatory Visit
Admission: RE | Admit: 2023-02-06 | Discharge: 2023-02-06 | Disposition: A | Discharge status: Home / Self Care | Payer: BC Managed Care – PPO | Source: Ambulatory Visit | Attending: Internal Medicine | Admitting: Internal Medicine

## 2023-02-06 DIAGNOSIS — R109 Unspecified abdominal pain: Secondary | ICD-10-CM | POA: Diagnosis not present

## 2023-02-06 DIAGNOSIS — R1032 Left lower quadrant pain: Secondary | ICD-10-CM | POA: Diagnosis not present

## 2023-02-06 DIAGNOSIS — K5730 Diverticulosis of large intestine without perforation or abscess without bleeding: Secondary | ICD-10-CM | POA: Diagnosis not present

## 2023-02-06 DIAGNOSIS — K449 Diaphragmatic hernia without obstruction or gangrene: Secondary | ICD-10-CM | POA: Diagnosis not present

## 2023-02-07 ENCOUNTER — Other Ambulatory Visit
Admission: RE | Admit: 2023-02-07 | Discharge: 2023-02-07 | Disposition: A | Discharge status: Home / Self Care | Payer: Self-pay | Source: Ambulatory Visit | Attending: Oncology | Admitting: Oncology

## 2023-02-07 DIAGNOSIS — Z006 Encounter for examination for normal comparison and control in clinical research program: Secondary | ICD-10-CM | POA: Insufficient documentation

## 2023-02-17 DIAGNOSIS — M62830 Muscle spasm of back: Secondary | ICD-10-CM | POA: Diagnosis not present

## 2023-02-17 DIAGNOSIS — M9905 Segmental and somatic dysfunction of pelvic region: Secondary | ICD-10-CM | POA: Diagnosis not present

## 2023-02-17 DIAGNOSIS — M9903 Segmental and somatic dysfunction of lumbar region: Secondary | ICD-10-CM | POA: Diagnosis not present

## 2023-02-17 DIAGNOSIS — M955 Acquired deformity of pelvis: Secondary | ICD-10-CM | POA: Diagnosis not present

## 2023-02-18 ENCOUNTER — Ambulatory Visit: Payer: BC Managed Care – PPO | Admitting: Dermatology

## 2023-02-19 ENCOUNTER — Ambulatory Visit
Admission: RE | Admit: 2023-02-19 | Discharge: 2023-02-19 | Disposition: A | Discharge status: Home / Self Care | Payer: BC Managed Care – PPO | Source: Ambulatory Visit | Attending: Internal Medicine | Admitting: Internal Medicine

## 2023-02-19 DIAGNOSIS — Z1231 Encounter for screening mammogram for malignant neoplasm of breast: Secondary | ICD-10-CM | POA: Diagnosis not present

## 2023-02-25 ENCOUNTER — Encounter: Payer: Self-pay | Admitting: Internal Medicine

## 2023-02-25 ENCOUNTER — Telehealth: Payer: BC Managed Care – PPO | Admitting: Internal Medicine

## 2023-02-25 VITALS — Ht 67.5 in | Wt 205.0 lb

## 2023-02-25 DIAGNOSIS — F331 Major depressive disorder, recurrent, moderate: Secondary | ICD-10-CM | POA: Diagnosis not present

## 2023-02-25 DIAGNOSIS — K2100 Gastro-esophageal reflux disease with esophagitis, without bleeding: Secondary | ICD-10-CM

## 2023-02-25 DIAGNOSIS — Z1211 Encounter for screening for malignant neoplasm of colon: Secondary | ICD-10-CM

## 2023-02-25 MED ORDER — BUPROPION HCL ER (XL) 150 MG PO TB24: 150.0000 mg | ORAL_TABLET | Freq: Every day | ORAL | 2 refills | Status: DC

## 2023-02-25 MED ORDER — PANTOPRAZOLE SODIUM 40 MG PO TBEC: 40.0000 mg | DELAYED_RELEASE_TABLET | Freq: Two times a day (BID) | ORAL | 1 refills | Status: DC

## 2023-02-25 NOTE — Assessment & Plan Note (Signed)
Nocturnal symptoms DESpite APPROPRIATE USE OF PANTOPRAZOLE BID, change to smaller meals,  last meal 4 hours before bedtime and use of wedge pillow.  Advised to avoid carbonated beverages and add mylanta prn bedtime .  Has small HH.  Referring to [facility] .

## 2023-02-25 NOTE — Assessment & Plan Note (Signed)
Symptoms have resolved on wellbutrin 300 mg daily. Reduce to 150 mg daily

## 2023-02-25 NOTE — Progress Notes (Unsigned)
Virtual Visit via Caregility   Note   This format is felt to be most appropriate for this patient at this time.  All issues noted in this document were discussed and addressed.  No physical exam was performed (except for noted visual exam findings with Video Visits).   I connected with Colleen Washington on  02/25/23 at  3:30 PM EDT by a video enabled telemedicine application  and verified that I am speaking with the correct person using two identifiers. Location patient: home Location provider: work or home office Persons participating in the virtual visit: patient, provider  I discussed the limitations, risks, security and privacy concerns of performing an evaluation and management service by telephone and the availability of in person appointments. I also discussed with the patient that there may be a patient responsible charge related to this service. The patient expressed understanding and agreed to proceed.   Reason for visit: 1) reflux esophagitis 2) depression 3) IBS   HPI:  \62 yr old female  with h/o MDD presents virtually to discuss wellbutrin wean which was recommended in May with every other day use of  Current dose of 300 mg making her anxious   Reviewed ER visit from July 22   IBS acting up    ROS: See pertinent positives and negatives per HPI.  Past Medical History:  Diagnosis Date   allergic rhinitis    Anxiety    AR (allergic rhinitis)    Asthma    Depression    Endometriosis    GERD (gastroesophageal reflux disease)    H/O migraine    Herniated intervertebral disc    History of diverticulitis of colon    Hyperlipidemia    Hypertension    IBS (irritable bowel syndrome)    Irritable bowel syndrome    Nonobstructive CAD (coronary artery disease)    a. 11/2019 Cor CTA: Cor Ca2+ = 141 (95th %'ile). LAD 25-49p.   Psoriasis    Tobacco abuse     Past Surgical History:  Procedure Laterality Date   ABDOMINAL HYSTERECTOMY  2003   tvhuso   BREAST BIOPSY Left 08/2014    benign-BENIGN BREAST TISSUE WITH FIBROSIS   DILATION AND CURETTAGE OF UTERUS      Family History  Problem Relation Age of Onset   Arthritis Mother    Hyperlipidemia Mother    Cancer Father        colon   Diabetes Brother    Breast cancer Paternal Aunt 10   Arthritis Maternal Grandmother    Hyperlipidemia Maternal Grandfather    Heart disease Neg Hx    Colon cancer Neg Hx     SOCIAL HX: ***   Current Outpatient Medications:    albuterol (VENTOLIN HFA) 108 (90 Base) MCG/ACT inhaler, INHALE 1-2 PUFFS INTO THE LUNGS EVERY 4 HOURS AS NEEDED FOR WHEEZING OR SHORTNESS OF BREATH., Disp: 18 each, Rfl: 2   aspirin EC 81 MG tablet, Take 1 tablet (81 mg total) by mouth daily. Swallow whole., Disp: , Rfl:    atorvastatin (LIPITOR) 80 MG tablet, Take 1 tablet (80 mg total) by mouth daily., Disp: 90 tablet, Rfl: 3   buPROPion (WELLBUTRIN XL) 300 MG 24 hr tablet, TAKE 1 TABLET BY MOUTH EVERY DAY (Patient taking differently: Take 150 mg by mouth daily.), Disp: 90 tablet, Rfl: 1   busPIRone (BUSPAR) 15 MG tablet, Take 1 tablet (15 mg total) by mouth 2 (two) times daily as needed., Disp: 180 tablet, Rfl: 2   Calcipotriene-Betameth Diprop (ENSTILAR)  0.005-0.064 % FOAM, Qd to aa hands, feet, elbows until clear, then prn flares, Disp: 60 g, Rfl: 6   cetirizine (ZYRTEC ALLERGY) 10 MG tablet, Take 1 tablet (10 mg total) by mouth daily., Disp: 90 tablet, Rfl: 1   fluticasone (FLONASE) 50 MCG/ACT nasal spray, Place 2 sprays into both nostrils daily., Disp: 16 g, Rfl: 6   fluticasone-salmeterol (ADVAIR) 100-50 MCG/ACT AEPB, Inhale 1 puff into the lungs 2 (two) times daily., Disp: 60 each, Rfl: 0   LORazepam (ATIVAN) 0.5 MG tablet, Take 1 tablet (0.5 mg total) by mouth 2 (two) times daily as needed for anxiety., Disp: 30 tablet, Rfl: 0   Multiple Vitamins-Minerals (ALIVE MULTI-VITAMIN PO), Take by mouth., Disp: , Rfl:    pantoprazole (PROTONIX) 40 MG tablet, Take 1 tablet (40 mg total) by mouth 2 (two) times  daily. 30 minutes before meals, Disp: 180 tablet, Rfl: 0   promethazine (PHENERGAN) 12.5 MG tablet, TAKE 1 TABLET (12.5 MG TOTAL) BY MOUTH EVERY 4 HOURS AS NEEDED FOR NAUSEA AND VOMITING, Disp: 30 tablet, Rfl: 0   propranolol (INDERAL) 10 MG tablet, TAKE 1 TABLET (10 MG TOTAL) BY MOUTH 3 (THREE) TIMES DAILY. AS NEEDED FOR RAPID HEART RATE, Disp: 30 tablet, Rfl: 0   Risankizumab-rzaa (SKYRIZI PEN) 150 MG/ML SOAJ, Inject 150 mg into the skin as directed. Every 12 weeks for maintenance., Disp: 1 mL, Rfl: 2   Multiple Vitamins-Minerals (AIRBORNE PO), Take by mouth. (Patient not taking: Reported on 02/25/2023), Disp: , Rfl:    tazarotene (AVAGE) 0.1 % cream, Qhs to hands and feet prn psoriasis flares (Patient not taking: Reported on 02/25/2023), Disp: 60 g, Rfl: 6  EXAM:  VITALS per patient if applicable:  GENERAL: alert, oriented, appears well and in no acute distress  HEENT: atraumatic, conjunttiva clear, no obvious abnormalities on inspection of external nose and ears  NECK: normal movements of the head and neck  LUNGS: on inspection no signs of respiratory distress, breathing rate appears normal, no obvious gross SOB, gasping or wheezing  CV: no obvious cyanosis  MS: moves all visible extremities without noticeable abnormality  PSYCH/NEURO: pleasant and cooperative, no obvious depression or anxiety, speech and thought processing grossly intact  ASSESSMENT AND PLAN: Colon cancer screening      I discussed the assessment and treatment plan with the patient. The patient was provided an opportunity to ask questions and all were answered. The patient agreed with the plan and demonstrated an understanding of the instructions.   The patient was advised to call back or seek an in-person evaluation if the symptoms worsen or if the condition fails to improve as anticipated.   I spent 30 minutes dedicated to the care of this patient on the date of this encounter to include pre-visit review of  his medical history,  Face-to-face time with the patient , and post visit ordering of testing and therapeutics.    Sherlene Shams, MD

## 2023-03-18 DIAGNOSIS — M9905 Segmental and somatic dysfunction of pelvic region: Secondary | ICD-10-CM | POA: Diagnosis not present

## 2023-03-18 DIAGNOSIS — M955 Acquired deformity of pelvis: Secondary | ICD-10-CM | POA: Diagnosis not present

## 2023-03-18 DIAGNOSIS — M62830 Muscle spasm of back: Secondary | ICD-10-CM | POA: Diagnosis not present

## 2023-03-18 DIAGNOSIS — M9903 Segmental and somatic dysfunction of lumbar region: Secondary | ICD-10-CM | POA: Diagnosis not present

## 2023-03-29 ENCOUNTER — Other Ambulatory Visit: Payer: Self-pay | Admitting: Internal Medicine

## 2023-03-31 ENCOUNTER — Other Ambulatory Visit: Payer: Self-pay

## 2023-03-31 DIAGNOSIS — L409 Psoriasis, unspecified: Secondary | ICD-10-CM

## 2023-03-31 MED ORDER — ENSTILAR 0.005-0.064 % EX FOAM: CUTANEOUS | 2 refills | Status: DC

## 2023-03-31 MED ORDER — SKYRIZI PEN 150 MG/ML ~~LOC~~ SOAJ: 150.0000 mg | SUBCUTANEOUS | 0 refills | Status: DC

## 2023-03-31 NOTE — Progress Notes (Signed)
Rx refills

## 2023-04-15 ENCOUNTER — Encounter: Payer: Self-pay | Admitting: Physician Assistant

## 2023-04-15 ENCOUNTER — Other Ambulatory Visit: Payer: Self-pay | Admitting: Physician Assistant

## 2023-04-15 ENCOUNTER — Ambulatory Visit: Payer: BC Managed Care – PPO | Admitting: Physician Assistant

## 2023-04-15 VITALS — BP 127/85 | HR 78 | Temp 98.1°F | Ht 67.5 in | Wt 212.0 lb

## 2023-04-15 DIAGNOSIS — M955 Acquired deformity of pelvis: Secondary | ICD-10-CM | POA: Diagnosis not present

## 2023-04-15 DIAGNOSIS — Z1211 Encounter for screening for malignant neoplasm of colon: Secondary | ICD-10-CM

## 2023-04-15 DIAGNOSIS — K219 Gastro-esophageal reflux disease without esophagitis: Secondary | ICD-10-CM | POA: Diagnosis not present

## 2023-04-15 DIAGNOSIS — M9903 Segmental and somatic dysfunction of lumbar region: Secondary | ICD-10-CM | POA: Diagnosis not present

## 2023-04-15 DIAGNOSIS — M62830 Muscle spasm of back: Secondary | ICD-10-CM | POA: Diagnosis not present

## 2023-04-15 DIAGNOSIS — M9905 Segmental and somatic dysfunction of pelvic region: Secondary | ICD-10-CM | POA: Diagnosis not present

## 2023-04-15 MED ORDER — RABEPRAZOLE SODIUM 20 MG PO TBEC
20.0000 mg | DELAYED_RELEASE_TABLET | Freq: Two times a day (BID) | ORAL | 11 refills | Status: DC
Start: 2023-04-15 — End: 2023-10-13

## 2023-04-15 NOTE — Progress Notes (Signed)
Celso Amy, PA-C [address]  [address]  [place], [place] [address]  Main: [phone]  Fax: [phone]   Gastroenterology Consultation  Referring Provider:     Sherlene Shams, MD Primary Care Physician:  Sherlene Shams, MD Primary Gastroenterologist:  Celso Amy, PA-C / Dr. Midge Minium   Reason for Consultation:     GERD, colonoscopy        HPI:   Colleen Washington is a 62 y.o. y/o female referred for consultation & management  by Sherlene Shams, MD.    Last colonoscopy 02/2011 showed diverticulosis, otherwise normal.  Patient is overdue for a 10-year repeat screening colonoscopy.  She vomited GoLytely in the past.  She is requesting a small volume prep.  Has history of GERD and IBS.  Takes pantoprazole 40 Mg twice daily.  No previous EGD.  History of tobacco use (smoking).  She reports being on PPI for many years.  She reports daily breakthrough heartburn on high-dose PPI, getting worse for 1 year.  Has nocturnal acid reflux.  Sour belches.  She stopped drinking soda.  She drinks 1 cup of coffee in the morning.  Denies dysphagia or weight loss.  Admits to weight gain.  She has history of IBS with occasional episodes of diarrhea.  Takes OTC Imodium as needed.  Denies rectal bleeding or other symptoms.  Past Medical History:  Diagnosis Date   allergic rhinitis    Anxiety    AR (allergic rhinitis)    Asthma    Depression    Endometriosis    GERD (gastroesophageal reflux disease)    H/O migraine    Herniated intervertebral disc    History of diverticulitis of colon    Hyperlipidemia    Hypertension    IBS (irritable bowel syndrome)    Irritable bowel syndrome    Nonobstructive CAD (coronary artery disease)    a. 11/2019 Cor CTA: Cor Ca2+ = 141 (95th %'ile). LAD 25-49p.   Psoriasis    Tobacco abuse     Past Surgical History:  Procedure Laterality Date   ABDOMINAL HYSTERECTOMY  2003   tvhuso   BREAST BIOPSY Left 08/2014   benign-BENIGN BREAST TISSUE WITH  FIBROSIS   DILATION AND CURETTAGE OF UTERUS      Prior to Admission medications   Medication Sig Start Date End Date Taking? Authorizing Provider  albuterol (VENTOLIN HFA) 108 (90 Base) MCG/ACT inhaler INHALE 1-2 PUFFS INTO THE LUNGS EVERY 4 HOURS AS NEEDED FOR WHEEZING OR SHORTNESS OF BREATH. 01/22/21   Sherlene Shams, MD  aspirin EC 81 MG tablet Take 1 tablet (81 mg total) by mouth daily. Swallow whole. 05/17/21   End, Cristal Deer, MD  atorvastatin (LIPITOR) 80 MG tablet Take 1 tablet (80 mg total) by mouth daily. 11/05/22   End, Cristal Deer, MD  buPROPion (WELLBUTRIN XL) 150 MG 24 hr tablet TAKE 1 TABLET BY MOUTH EVERY DAY 03/31/23   Sherlene Shams, MD  busPIRone (BUSPAR) 15 MG tablet Take 1 tablet (15 mg total) by mouth 2 (two) times daily as needed. 11/25/22   Sherlene Shams, MD  Calcipotriene-Betameth Diprop (ENSTILAR) 0.005-0.064 % FOAM Qd to aa hands, feet, elbows until clear, then prn flares 03/31/23   Willeen Niece, MD  cetirizine (ZYRTEC ALLERGY) 10 MG tablet Take 1 tablet (10 mg total) by mouth daily. 09/15/22   Junie Spencer, FNP  fluticasone (FLONASE) 50 MCG/ACT nasal spray Place 2 sprays into both nostrils daily. 09/15/22   Hawks,  Christy A, FNP  fluticasone-salmeterol (ADVAIR) 100-50 MCG/ACT AEPB Inhale 1 puff into the lungs 2 (two) times daily. 11/28/21   Sherlene Shams, MD  LORazepam (ATIVAN) 0.5 MG tablet Take 1 tablet (0.5 mg total) by mouth 2 (two) times daily as needed for anxiety. 10/07/22   Sherlene Shams, MD  Multiple Vitamins-Minerals (AIRBORNE PO) Take by mouth. Patient not taking: Reported on 02/25/2023    [provider]  Multiple Vitamins-Minerals (ALIVE MULTI-VITAMIN PO) Take by mouth.    [provider]  pantoprazole (PROTONIX) 40 MG tablet Take 1 tablet (40 mg total) by mouth 2 (two) times daily. 30 minutes before meals 02/25/23   Sherlene Shams, MD  promethazine (PHENERGAN) 12.5 MG tablet TAKE 1 TABLET (12.5 MG TOTAL) BY MOUTH EVERY 4 HOURS AS NEEDED  FOR NAUSEA AND VOMITING 11/05/22   Sherlene Shams, MD  propranolol (INDERAL) 10 MG tablet TAKE 1 TABLET (10 MG TOTAL) BY MOUTH 3 (THREE) TIMES DAILY. AS NEEDED FOR RAPID HEART RATE 06/23/19   Sherlene Shams, MD  risankizumab-rzaa (SKYRIZI PEN) 150 MG/ML pen Inject 1 mL (150 mg total) into the skin as directed. Every 12 weeks for maintenance. 03/31/23   Willeen Niece, MD    Family History  Problem Relation Age of Onset   Arthritis Mother    Hyperlipidemia Mother    Cancer Father        colon   Diabetes Brother    Breast cancer Paternal Aunt 33   Arthritis Maternal Grandmother    Hyperlipidemia Maternal Grandfather    Heart disease Neg Hx    Colon cancer Neg Hx      Social History   Tobacco Use   Smoking status: Some Days    Current packs/day: 0.00    Average packs/day: 0.3 packs/day for 20.0 years (5.0 ttl pk-yrs)    Types: Cigarettes    Start date: 11/14/2001    Last attempt to quit: 11/14/2021    Years since quitting: 1.4   Smokeless tobacco: Never   Tobacco comments:    2-3 cigarettes daily   Vaping Use   Vaping status: Never Used  Substance Use Topics   Alcohol use: Yes    Alcohol/week: 0.0 standard drinks of alcohol    Comment: occas   Drug use: No    Allergies as of 04/15/2023 - Review Complete 04/15/2023  Allergen Reaction Noted   Compazine [prochlorperazine] Swelling 03/04/2012    Review of Systems:    All systems reviewed and negative except where noted in HPI.   Physical Exam:  BP 127/85   Pulse 78   Temp 98.1 F (36.7 C)   Ht 5' 7.5" (1.715 m)   Wt 212 lb (96.2 kg)   BMI 32.71 kg/m  No LMP recorded. Patient has had a hysterectomy.  General:   Alert,  Well-developed, well-nourished, pleasant and cooperative in NAD Lungs:  Respirations even and unlabored.  Clear throughout to auscultation.   No wheezes, crackles, or rhonchi. No acute distress. Heart:  Regular rate and rhythm; no murmurs, clicks, rubs, or gallops. Abdomen:  Normal bowel sounds.  No  bruits.  Soft, and non-distended without masses, hepatosplenomegaly or hernias noted.  No Tenderness.  No guarding or rebound tenderness.    Neurologic:  Alert and oriented x3;  grossly normal neurologically. Psych:  Alert and cooperative. Normal mood and affect.  Imaging Studies: No results found.  Assessment and Plan:   Colleen Washington is a 62 y.o. y/o female has been  referred for:  1.  GERD; on high-dose PPI for many years; worsening breakthrough heartburn for 1 year not controlled on twice daily PPI.  Scheduling EGD to screen for Barrett's esophagus  Recommend Lifestyle Modifications to prevent Acid Reflux.  Rec. Avoid coffee, sodas, peppermint, citrus fruits, and spicey foods.  Avoid eating 2-3 hours before bedtime.   She has tried pantoprazole, omeprazole, Nexium, and famotidine which have not controlled her GERD.  Stop Pantoprazole which is not controlling her GERD.  Start Aciphex 20mg  1 tab twice daily before meals.  OK to also take OTC Pepcid 20 Mg twice daily and Tums antacid as needed.  If Aciphex does not work, Haematologist. Or Voquezna.   2.  Colon cancer screening: Normal colonoscopy 02/2011.  Due for 10-year repeat.  Scheduling Colonoscopy I discussed risks of colonoscopy with patient to include risk of bleeding, colon perforation, and risk of sedation.  Patient expressed understanding and agrees to proceed with colonoscopy.   Follow up based on procedure results and GI symptoms.  Celso Amy, PA-C

## 2023-05-06 ENCOUNTER — Encounter: Payer: Self-pay | Admitting: Dermatology

## 2023-05-22 ENCOUNTER — Encounter: Payer: Self-pay | Admitting: Gastroenterology

## 2023-05-22 ENCOUNTER — Ambulatory Visit
Admission: RE | Admit: 2023-05-22 | Discharge: 2023-05-22 | Disposition: A | Discharge status: Home / Self Care | Payer: BC Managed Care – PPO | Attending: Gastroenterology | Admitting: Gastroenterology

## 2023-05-22 ENCOUNTER — Ambulatory Visit: Payer: BC Managed Care – PPO | Admitting: Certified Registered"

## 2023-05-22 ENCOUNTER — Other Ambulatory Visit: Payer: Self-pay

## 2023-05-22 ENCOUNTER — Encounter
Admission: RE | Disposition: A | Discharge status: Home / Self Care | Payer: Self-pay | Source: Home / Self Care | Attending: Gastroenterology

## 2023-05-22 DIAGNOSIS — F32A Depression, unspecified: Secondary | ICD-10-CM | POA: Insufficient documentation

## 2023-05-22 DIAGNOSIS — I2510 Atherosclerotic heart disease of native coronary artery without angina pectoris: Secondary | ICD-10-CM | POA: Diagnosis not present

## 2023-05-22 DIAGNOSIS — L409 Psoriasis, unspecified: Secondary | ICD-10-CM | POA: Diagnosis not present

## 2023-05-22 DIAGNOSIS — D125 Benign neoplasm of sigmoid colon: Secondary | ICD-10-CM | POA: Diagnosis not present

## 2023-05-22 DIAGNOSIS — E785 Hyperlipidemia, unspecified: Secondary | ICD-10-CM | POA: Insufficient documentation

## 2023-05-22 DIAGNOSIS — K2100 Gastro-esophageal reflux disease with esophagitis, without bleeding: Secondary | ICD-10-CM

## 2023-05-22 DIAGNOSIS — K641 Second degree hemorrhoids: Secondary | ICD-10-CM

## 2023-05-22 DIAGNOSIS — J45909 Unspecified asthma, uncomplicated: Secondary | ICD-10-CM | POA: Insufficient documentation

## 2023-05-22 DIAGNOSIS — F419 Anxiety disorder, unspecified: Secondary | ICD-10-CM | POA: Insufficient documentation

## 2023-05-22 DIAGNOSIS — K635 Polyp of colon: Secondary | ICD-10-CM | POA: Diagnosis not present

## 2023-05-22 DIAGNOSIS — F17210 Nicotine dependence, cigarettes, uncomplicated: Secondary | ICD-10-CM | POA: Diagnosis not present

## 2023-05-22 DIAGNOSIS — Z1211 Encounter for screening for malignant neoplasm of colon: Secondary | ICD-10-CM

## 2023-05-22 DIAGNOSIS — K589 Irritable bowel syndrome without diarrhea: Secondary | ICD-10-CM | POA: Diagnosis not present

## 2023-05-22 DIAGNOSIS — K5730 Diverticulosis of large intestine without perforation or abscess without bleeding: Secondary | ICD-10-CM

## 2023-05-22 DIAGNOSIS — I10 Essential (primary) hypertension: Secondary | ICD-10-CM | POA: Insufficient documentation

## 2023-05-22 DIAGNOSIS — K449 Diaphragmatic hernia without obstruction or gangrene: Secondary | ICD-10-CM

## 2023-05-22 DIAGNOSIS — K219 Gastro-esophageal reflux disease without esophagitis: Secondary | ICD-10-CM

## 2023-05-22 HISTORY — DX: Polyp of colon: K63.5

## 2023-05-22 HISTORY — PX: COLONOSCOPY WITH PROPOFOL: SHX5780

## 2023-05-22 HISTORY — PX: ESOPHAGOGASTRODUODENOSCOPY (EGD) WITH PROPOFOL: SHX5813

## 2023-05-22 HISTORY — PX: POLYPECTOMY: SHX5525

## 2023-05-22 SURGERY — ESOPHAGOGASTRODUODENOSCOPY (EGD) WITH PROPOFOL
Anesthesia: General

## 2023-05-22 MED ORDER — IPRATROPIUM-ALBUTEROL 0.5-2.5 (3) MG/3ML IN SOLN
RESPIRATORY_TRACT | Status: AC
  Filled 2023-05-22: qty 3

## 2023-05-22 MED ORDER — SODIUM CHLORIDE 0.9 % IV SOLN: INTRAVENOUS | Status: DC

## 2023-05-22 MED ORDER — PROPOFOL 10 MG/ML IV BOLUS
INTRAVENOUS | Status: DC | PRN
  Administered 2023-05-22: 150 ug/kg/min via INTRAVENOUS
  Administered 2023-05-22: 100 mg via INTRAVENOUS

## 2023-05-22 MED ORDER — LIDOCAINE HCL (CARDIAC) PF 100 MG/5ML IV SOSY
PREFILLED_SYRINGE | INTRAVENOUS | Status: DC | PRN
  Administered 2023-05-22: 100 mg via INTRAVENOUS

## 2023-05-22 MED ORDER — LIDOCAINE HCL URETHRAL/MUCOSAL 2 % EX GEL
CUTANEOUS | Status: DC | PRN
  Administered 2023-05-22: 1 via TOPICAL

## 2023-05-22 MED ORDER — IPRATROPIUM-ALBUTEROL 20-100 MCG/ACT IN AERS
INHALATION_SPRAY | RESPIRATORY_TRACT | Status: DC | PRN
  Administered 2023-05-22: 1 via RESPIRATORY_TRACT

## 2023-05-22 NOTE — H&P (Signed)
Midge Minium, MD [facility] [address] [place], [place] [address] Phone:[phone] Fax : [phone]  Primary Care Physician:  Sherlene Shams, MD Primary Gastroenterologist:  Dr. Servando Snare  Pre-Procedure History & Physical: HPI:  Colleen Washington is a 62 y.o. female is here for an endoscopy and colonoscopy.   Past Medical History:  Diagnosis Date   allergic rhinitis    Anxiety    AR (allergic rhinitis)    Asthma    Depression    Endometriosis    GERD (gastroesophageal reflux disease)    H/O migraine    Herniated intervertebral disc    History of diverticulitis of colon    Hyperlipidemia    Hypertension    IBS (irritable bowel syndrome)    Irritable bowel syndrome    Nonobstructive CAD (coronary artery disease)    a. 11/2019 Cor CTA: Cor Ca2+ = 141 (95th %'ile). LAD 25-49p.   Psoriasis    Tobacco abuse     Past Surgical History:  Procedure Laterality Date   ABDOMINAL HYSTERECTOMY  2003   [id number]   BREAST BIOPSY Left 08/2014   benign-BENIGN BREAST TISSUE WITH FIBROSIS   DILATION AND CURETTAGE OF UTERUS      Prior to Admission medications   Medication Sig Start Date End Date Taking? Authorizing Provider  albuterol (VENTOLIN HFA) 108 (90 Base) MCG/ACT inhaler INHALE 1-2 PUFFS INTO THE LUNGS EVERY 4 HOURS AS NEEDED FOR WHEEZING OR SHORTNESS OF BREATH. 01/22/21  Yes Sherlene Shams, MD  aspirin EC 81 MG tablet Take 1 tablet (81 mg total) by mouth daily. Swallow whole. 05/17/21  Yes End, Cristal Deer, MD  atorvastatin (LIPITOR) 80 MG tablet Take 1 tablet (80 mg total) by mouth daily. 11/05/22  Yes End, Cristal Deer, MD  buPROPion (WELLBUTRIN XL) 150 MG 24 hr tablet TAKE 1 TABLET BY MOUTH EVERY DAY 03/31/23  Yes Sherlene Shams, MD  busPIRone (BUSPAR) 15 MG tablet Take 1 tablet (15 mg total) by mouth 2 (two) times daily as needed. 11/25/22  Yes Sherlene Shams, MD  LORazepam (ATIVAN) 0.5 MG tablet Take 1 tablet (0.5 mg total) by mouth 2 (two) times daily as needed for anxiety.  10/07/22  Yes Sherlene Shams, MD  Multiple Vitamins-Minerals (AIRBORNE PO) Take by mouth.   Yes [provider]  Multiple Vitamins-Minerals (ALIVE MULTI-VITAMIN PO) Take by mouth.   Yes [provider]  promethazine (PHENERGAN) 12.5 MG tablet TAKE 1 TABLET (12.5 MG TOTAL) BY MOUTH EVERY 4 HOURS AS NEEDED FOR NAUSEA AND VOMITING 11/05/22  Yes Sherlene Shams, MD  propranolol (INDERAL) 10 MG tablet TAKE 1 TABLET (10 MG TOTAL) BY MOUTH 3 (THREE) TIMES DAILY. AS NEEDED FOR RAPID HEART RATE 06/23/19  Yes Sherlene Shams, MD  cetirizine (ZYRTEC ALLERGY) 10 MG tablet Take 1 tablet (10 mg total) by mouth daily. 09/15/22   Junie Spencer, FNP  fluticasone (FLONASE) 50 MCG/ACT nasal spray Place 2 sprays into both nostrils daily. 09/15/22   Junie Spencer, FNP  RABEprazole (ACIPHEX) 20 MG tablet Take 1 tablet (20 mg total) by mouth 2 (two) times daily before a meal. 04/15/23 04/09/24  Celso Amy, PA-C  risankizumab-rzaa (SKYRIZI PEN) 150 MG/ML pen Inject 1 mL (150 mg total) into the skin as directed. Every 12 weeks for maintenance. 03/31/23   Willeen Niece, MD    Allergies as of 04/15/2023 - Review Complete 04/15/2023  Allergen Reaction Noted   Compazine [prochlorperazine] Swelling 03/04/2012    Family History  Problem  Relation Age of Onset   Arthritis Mother    Hyperlipidemia Mother    Cancer Father        colon   Diabetes Brother    Breast cancer Paternal Aunt 57   Arthritis Maternal Grandmother    Hyperlipidemia Maternal Grandfather    Heart disease Neg Hx    Colon cancer Neg Hx     Social History   Socioeconomic History   Marital status: Married    Spouse name: Not on file   Number of children: Not on file   Years of education: Not on file   Highest education level: Not on file  Occupational History   Not on file  Tobacco Use   Smoking status: Some Days    Current packs/day: 0.00    Average packs/day: 0.3 packs/day for 20.0 years (5.0 ttl pk-yrs)    Types:  Cigarettes    Start date: 11/14/2001    Last attempt to quit: 11/14/2021    Years since quitting: 1.5   Smokeless tobacco: Never   Tobacco comments:    2-3 cigarettes daily   Vaping Use   Vaping status: Never Used  Substance and Sexual Activity   Alcohol use: Yes    Alcohol/week: 0.0 standard drinks of alcohol    Comment: occas   Drug use: No   Sexual activity: Yes    Birth control/protection: Surgical  Other Topics Concern   Not on file  Social History Narrative   Not on file   Social Determinants of Health   Financial Resource Strain: Not on file  Food Insecurity: Not on file  Transportation Needs: Not on file  Physical Activity: Not on file  Stress: Not on file  Social Connections: Not on file  Intimate Partner Violence: Not on file    Review of Systems: See HPI, otherwise negative ROS  Physical Exam: BP (!) 146/70   Pulse 93   Temp (!) 96.7 F (35.9 C) (Temporal)   Resp 20   Ht 5' 7.5" (1.715 m)   Wt 95.2 kg   SpO2 99%   BMI 32.37 kg/m  General:   Alert,  pleasant and cooperative in NAD Head:  Normocephalic and atraumatic. Neck:  Supple; no masses or thyromegaly. Lungs:  Clear throughout to auscultation.    Heart:  Regular rate and rhythm. Abdomen:  Soft, nontender and nondistended. Normal bowel sounds, without guarding, and without rebound.   Neurologic:  Alert and  oriented x4;  grossly normal neurologically.  Impression/Plan: Colleen Washington is here for an endoscopy and colonoscopy to be performed for GERD and screening  Risks, benefits, limitations, and alternatives regarding  endoscopy and colonoscopy have been reviewed with the patient.  Questions have been answered.  All parties agreeable.   Midge Minium, MD  05/22/2023, 7:48 AM

## 2023-05-22 NOTE — Op Note (Signed)
[facility] Gastroenterology Patient Name: Colleen Washington Procedure Date: 05/22/2023 7:50 AM MRN: [id number] Account #: [id number] Date of Birth: 03/18/1961 Admit Type: Ambulatory Age: 62 Room: [facility] ENDO ROOM 4 Gender: Female Note Status: Finalized Instrument Name: Upper Endoscope [id number] Procedure:             Upper GI endoscopy Indications:           Heartburn Providers:             Midge Minium MD, MD Referring MD:          Duncan Dull, MD (Referring MD) Medicines:             Propofol per Anesthesia Complications:         No immediate complications. Procedure:             Pre-Anesthesia Assessment:                        - Prior to the procedure, a History and Physical was                         performed, and patient medications and allergies were                         reviewed. The patient's tolerance of previous                         anesthesia was also reviewed. The risks and benefits                         of the procedure and the sedation options and risks                         were discussed with the patient. All questions were                         answered, and informed consent was obtained. Prior                         Anticoagulants: The patient has taken no anticoagulant                         or antiplatelet agents. ASA Grade Assessment: II - A                         patient with mild systemic disease. After reviewing                         the risks and benefits, the patient was deemed in                         satisfactory condition to undergo the procedure.                        After obtaining informed consent, the endoscope was                         passed under direct vision. Throughout the procedure,  the patient's blood pressure, pulse, and oxygen                         saturations were monitored continuously. The Endoscope                         was introduced through the mouth, and advanced to the                          second part of duodenum. The upper GI endoscopy was                         accomplished without difficulty. The patient tolerated                         the procedure well. Findings:      A 5 cm hiatal hernia was present.      LA Grade C (one or more mucosal breaks continuous between tops of 2 or       more mucosal folds, less than 75% circumference) esophagitis with no       bleeding was found in the lower third of the esophagus.      The stomach was normal.      The examined duodenum was normal. Impression:            - 5 cm hiatal hernia.                        - LA Grade C reflux esophagitis with no bleeding.                        - Normal stomach.                        - Normal examined duodenum.                        - No specimens collected. Recommendation:        - Discharge patient to home.                        - Resume previous diet.                        - Continue present medications. Procedure Code(s):     --- Professional ---                        [phone], Esophagogastroduodenoscopy, flexible,                         transoral; diagnostic, including collection of                         specimen(s) by brushing or washing, when performed                         (separate procedure) Diagnosis Code(s):     --- Professional ---                        R12, Heartburn  K21.00, Gastro-esophageal reflux disease with                         esophagitis, without bleeding CPT copyright 2022 American Medical Association. All rights reserved. The codes documented in this report are preliminary and upon coder review may  be revised to meet current compliance requirements. Midge Minium MD, MD 05/22/2023 8:00:34 AM This report has been signed electronically. Number of Addenda: 0 Note Initiated On: 05/22/2023 7:50 AM Estimated Blood Loss:  Estimated blood loss: none.      [facility]

## 2023-05-22 NOTE — Anesthesia Postprocedure Evaluation (Signed)
Anesthesia Post Note  Patient: Colleen Washington  Procedure(s) Performed: ESOPHAGOGASTRODUODENOSCOPY (EGD) WITH PROPOFOL COLONOSCOPY WITH PROPOFOL POLYPECTOMY  Patient location during evaluation: Endoscopy Anesthesia Type: General Level of consciousness: awake and alert Pain management: pain level controlled Vital Signs Assessment: post-procedure vital signs reviewed and stable Respiratory status: spontaneous breathing, nonlabored ventilation, respiratory function stable and patient connected to nasal cannula oxygen Cardiovascular status: blood pressure returned to baseline and stable Postop Assessment: no apparent nausea or vomiting Anesthetic complications: no   There were no known notable events for this encounter.   Last Vitals:  Vitals:   05/22/23 0739 05/22/23 0825  BP: (!) 146/70 113/84  Pulse: 93 (!) 101  Resp: 20 20  Temp: (!) 35.9 C   SpO2: 99% 98%    Last Pain:  Vitals:   05/22/23 0825  TempSrc:   PainSc: Asleep                 Corinda Gubler

## 2023-05-22 NOTE — Anesthesia Preprocedure Evaluation (Signed)
Anesthesia Evaluation  Patient identified by MRN, date of birth, ID band Patient awake    Reviewed: Allergy & Precautions, NPO status , Patient's Chart, lab work & pertinent test results  History of Anesthesia Complications Negative for: history of anesthetic complications  Airway Mallampati: II  TM Distance: >3 FB Neck ROM: Full    Dental no notable dental hx. (+) Teeth Intact   Pulmonary asthma , neg sleep apnea, neg COPD, Current Smoker and Patient abstained from smoking.   Pulmonary exam normal breath sounds clear to auscultation       Cardiovascular Exercise Tolerance: Good METShypertension, Pt. on medications + CAD  (-) Past MI (-) dysrhythmias  Rhythm:Regular Rate:Normal - Systolic murmurs    Neuro/Psych  Headaches PSYCHIATRIC DISORDERS Anxiety Depression       GI/Hepatic ,GERD  ,,(+)     (-) substance abuse    Endo/Other  neg diabetes    Renal/GU negative Renal ROS     Musculoskeletal   Abdominal   Peds  Hematology   Anesthesia Other Findings Past Medical History: No date: allergic rhinitis No date: Anxiety No date: AR (allergic rhinitis) No date: Asthma No date: Depression No date: Endometriosis No date: GERD (gastroesophageal reflux disease) No date: H/O migraine No date: Herniated intervertebral disc No date: History of diverticulitis of colon No date: Hyperlipidemia No date: Hypertension No date: IBS (irritable bowel syndrome) No date: Irritable bowel syndrome No date: Nonobstructive CAD (coronary artery disease)     Comment:  a. 11/2019 Cor CTA: Cor Ca2+ = 141 (95th %'ile). LAD               25-49p. No date: Psoriasis No date: Tobacco abuse  Reproductive/Obstetrics                             Anesthesia Physical Anesthesia Plan  ASA: 2  Anesthesia Plan: General   Post-op Pain Management: Minimal or no pain anticipated   Induction: Intravenous  PONV Risk  Score and Plan: 2 and Propofol infusion, TIVA and Ondansetron  Airway Management Planned: Nasal Cannula  Additional Equipment: None  Intra-op Plan:   Post-operative Plan:   Informed Consent: I have reviewed the patients History and Physical, chart, labs and discussed the procedure including the risks, benefits and alternatives for the proposed anesthesia with the patient or authorized representative who has indicated his/her understanding and acceptance.     Dental advisory given  Plan Discussed with: CRNA and Surgeon  Anesthesia Plan Comments: (Discussed risks of anesthesia with patient, including possibility of difficulty with spontaneous ventilation under anesthesia necessitating airway intervention, PONV, and rare risks such as cardiac or respiratory or neurological events, and allergic reactions. Discussed the role of CRNA in patient's perioperative care. Patient understands. Patient counseled on benefits of smoking cessation, and increased perioperative risks associated with continued smoking. )       Anesthesia Quick Evaluation

## 2023-05-22 NOTE — Transfer of Care (Signed)
Immediate Anesthesia Transfer of Care Note  Patient: Colleen Washington  Procedure(s) Performed: ESOPHAGOGASTRODUODENOSCOPY (EGD) WITH PROPOFOL COLONOSCOPY WITH PROPOFOL POLYPECTOMY  Patient Location: PACU  Anesthesia Type:General  Level of Consciousness: drowsy and patient cooperative  Airway & Oxygen Therapy: Patient Spontanous Breathing and Patient connected to face mask oxygen  Post-op Assessment: Report given to RN and Post -op Vital signs reviewed and stable  Post vital signs: stable  Last Vitals:  Vitals Value Taken Time  BP 113/84 05/22/23 0825  Temp    Pulse 101 05/22/23 0825  Resp 20 05/22/23 0825  SpO2 98 % 05/22/23 0825    Last Pain:  Vitals:   05/22/23 0825  TempSrc:   PainSc: Asleep         Complications: No notable events documented.

## 2023-05-22 NOTE — Op Note (Signed)
[facility] Gastroenterology Patient Name: Colleen Washington Procedure Date: 05/22/2023 7:49 AM MRN: [id number] Account #: [id number] Date of Birth: 09-Mar-1961 Admit Type: Ambulatory Age: 62 Room: [facility] ENDO ROOM 4 Gender: Female Note Status: Finalized Instrument Name: Prentice Docker [id number] Procedure:             Colonoscopy Indications:           Screening for colorectal malignant neoplasm Providers:             Midge Minium MD, MD Referring MD:          Duncan Dull, MD (Referring MD) Medicines:             Propofol per Anesthesia Complications:         No immediate complications. Procedure:             Pre-Anesthesia Assessment:                        - Prior to the procedure, a History and Physical was                         performed, and patient medications and allergies were                         reviewed. The patient's tolerance of previous                         anesthesia was also reviewed. The risks and benefits                         of the procedure and the sedation options and risks                         were discussed with the patient. All questions were                         answered, and informed consent was obtained. Prior                         Anticoagulants: The patient has taken no anticoagulant                         or antiplatelet agents. ASA Grade Assessment: II - A                         patient with mild systemic disease. After reviewing                         the risks and benefits, the patient was deemed in                         satisfactory condition to undergo the procedure.                        After obtaining informed consent, the colonoscope was                         passed under direct vision. Throughout the procedure,  the patient's blood pressure, pulse, and oxygen                         saturations were monitored continuously. The                         Colonoscope was introduced through the  anus and                         advanced to the the cecum, identified by appendiceal                         orifice and ileocecal valve. The colonoscopy was                         performed without difficulty. The patient tolerated                         the procedure well. The quality of the bowel                         preparation was excellent. Findings:      The perianal and digital rectal examinations were normal.      A 4 mm polyp was found in the transverse colon. The polyp was sessile.       The polyp was removed with a cold snare. Resection and retrieval were       complete.      Two sessile polyps were found in the sigmoid colon. The polyps were 2 to       3 mm in size. These polyps were removed with a cold snare. Resection and       retrieval were complete.      A few small-mouthed diverticula were found in the entire colon.      Non-bleeding internal hemorrhoids were found during retroflexion. The       hemorrhoids were Grade II (internal hemorrhoids that prolapse but reduce       spontaneously). Impression:            - One 4 mm polyp in the transverse colon, removed with                         a cold snare. Resected and retrieved.                        - Two 2 to 3 mm polyps in the sigmoid colon, removed                         with a cold snare. Resected and retrieved.                        - Diverticulosis in the entire examined colon.                        - Non-bleeding internal hemorrhoids. Recommendation:        - Discharge patient to home.                        - Resume previous diet.                        -  Continue present medications.                        - Await pathology results.                        - If the pathology report reveals adenomatous tissue,                         then repeat the colonoscopy for surveillance in 5                         years. Procedure Code(s):     --- Professional ---                        [phone], Colonoscopy,  flexible; with removal of                         tumor(s), polyp(s), or other lesion(s) by snare                         technique Diagnosis Code(s):     --- Professional ---                        Z12.11, Encounter for screening for malignant neoplasm                         of colon                        D12.5, Benign neoplasm of sigmoid colon CPT copyright 2022 American Medical Association. All rights reserved. The codes documented in this report are preliminary and upon coder review may  be revised to meet current compliance requirements. Midge Minium MD, MD 05/22/2023 8:17:43 AM This report has been signed electronically. Number of Addenda: 0 Note Initiated On: 05/22/2023 7:49 AM Scope Withdrawal Time: 0 hours 6 minutes 21 seconds  Total Procedure Duration: 0 hours 14 minutes 46 seconds  Estimated Blood Loss:  Estimated blood loss: none.      [facility]

## 2023-05-23 ENCOUNTER — Encounter: Payer: Self-pay | Admitting: Gastroenterology

## 2023-05-23 LAB — SURGICAL PATHOLOGY

## 2023-05-26 ENCOUNTER — Encounter: Payer: Self-pay | Admitting: Gastroenterology

## 2023-05-26 DIAGNOSIS — M62830 Muscle spasm of back: Secondary | ICD-10-CM | POA: Diagnosis not present

## 2023-05-26 DIAGNOSIS — M9903 Segmental and somatic dysfunction of lumbar region: Secondary | ICD-10-CM | POA: Diagnosis not present

## 2023-05-26 DIAGNOSIS — M9905 Segmental and somatic dysfunction of pelvic region: Secondary | ICD-10-CM | POA: Diagnosis not present

## 2023-05-26 DIAGNOSIS — M955 Acquired deformity of pelvis: Secondary | ICD-10-CM | POA: Diagnosis not present

## 2023-06-02 ENCOUNTER — Telehealth: Payer: Self-pay | Admitting: Physician Assistant

## 2023-06-02 ENCOUNTER — Telehealth: Payer: Self-pay

## 2023-06-02 NOTE — Telephone Encounter (Signed)
Tried reaching patient- left message to return call to office. Patient has questions regarding medication Acifex and a referral to surgery for the hiatal hernia.

## 2023-06-02 NOTE — Telephone Encounter (Signed)
Patient called in to speak with Mrs. Gerre Pebbles nurse.

## 2023-06-03 ENCOUNTER — Telehealth: Payer: Self-pay

## 2023-06-03 NOTE — Telephone Encounter (Signed)
Patient stated Dr..Wohl told her she needed to see a surgeon but patient would like to try the Aciphex.  She would like to talk to you about the EGD that was done.  I asked patient to call her pharmacy and have the PA started for the Aciphex.

## 2023-06-04 ENCOUNTER — Telehealth: Payer: Self-pay

## 2023-06-04 MED ORDER — DEXLANSOPRAZOLE 60 MG PO CPDR: 60.0000 mg | DELAYED_RELEASE_CAPSULE | Freq: Every day | ORAL | 3 refills | Status: DC

## 2023-06-04 NOTE — Telephone Encounter (Signed)
Patient notified. Dexilant sent to pharmacy.[name]   ller: Unspecified (Yesterday, 10:09 AM) EGD showed 5 cm hiatal hernia and LA grade C lower esophagitis.  No evidence of Barrett's.  If patient does not want hiatal hernia repair surgery at this time, then we can try different medication for acid reflux.  Go ahead and try Aciphex 20 Mg twice daily.  If that is not approved or does not work, then we can try Dexilant 60 mg 1 tablet once daily, #30, 2 refills.  Please schedule follow-up office visit with me in 1 month. Celso Amy, PA-C

## 2023-06-04 NOTE — Telephone Encounter (Signed)
The patient called back she stated that her insurance said she needs a prior authorization for the new medication.

## 2023-06-04 NOTE — Telephone Encounter (Signed)
Caller: Unspecified (Yesterday, 10:09 AM) EGD showed 5 cm hiatal hernia and LA grade C lower esophagitis.  No evidence of Barrett's.  If patient does not want hiatal hernia repair surgery at this time, then we can try different medication for acid reflux.  Go ahead and try Aciphex 20 Mg twice daily.  If that is not approved or does not work, then we can try Dexilant 60 mg 1 tablet once daily, #30, 2 refills.  Please schedule follow-up office visit with me in 1 month. Celso Amy, PA-C

## 2023-06-05 ENCOUNTER — Telehealth: Payer: Self-pay

## 2023-06-05 NOTE — Telephone Encounter (Signed)
Submitted pa through cover my meds for patient Dexilant waiting on response from patient insurance company

## 2023-06-05 NOTE — Telephone Encounter (Signed)
Submitted PA through cover med meds

## 2023-06-23 DIAGNOSIS — M9903 Segmental and somatic dysfunction of lumbar region: Secondary | ICD-10-CM | POA: Diagnosis not present

## 2023-06-23 DIAGNOSIS — M62830 Muscle spasm of back: Secondary | ICD-10-CM | POA: Diagnosis not present

## 2023-06-23 DIAGNOSIS — M955 Acquired deformity of pelvis: Secondary | ICD-10-CM | POA: Diagnosis not present

## 2023-06-23 DIAGNOSIS — M9905 Segmental and somatic dysfunction of pelvic region: Secondary | ICD-10-CM | POA: Diagnosis not present

## 2023-06-25 ENCOUNTER — Telehealth: Payer: Self-pay

## 2023-06-25 NOTE — Telephone Encounter (Signed)
Pt called stating her last medication for her reflux got turned down by her insurance. She didn't say what medication. Pt is still having reflux. Pt is having nausea and bloating. Please return call for advise.

## 2023-06-25 NOTE — Telephone Encounter (Signed)
Pt called stating her last medication for her reflux got turned down by her insurance. She didn't say what medication. Pt is still having reflux. Pt is having nausea and bloating. Please return call for advise.   Please advise.  Patient wanted to let you know that Dexilant and Aciphex has been denied.  She is taking Protonix 40 mg twice daily and continues to have the gnawing feeling in her stomach- she has tried baking soda and water as well with no relief. She is sipping on Gingerale and it usually helps but has not done so today. Recent colonoscopy/ EGD  05/22/23.

## 2023-06-26 ENCOUNTER — Telehealth: Payer: Self-pay

## 2023-06-26 DIAGNOSIS — K449 Diaphragmatic hernia without obstruction or gangrene: Secondary | ICD-10-CM

## 2023-06-26 MED ORDER — OMEPRAZOLE 40 MG PO CPDR: 40.0000 mg | DELAYED_RELEASE_CAPSULE | Freq: Two times a day (BID) | ORAL | 5 refills | Status: DC

## 2023-06-26 NOTE — Telephone Encounter (Signed)
Referral to surgery sent. Omeprazole sent to pharmacy. Patient notified.

## 2023-06-27 ENCOUNTER — Other Ambulatory Visit: Payer: Self-pay | Admitting: Dermatology

## 2023-06-30 ENCOUNTER — Ambulatory Visit: Payer: BC Managed Care – PPO | Admitting: Surgery

## 2023-06-30 ENCOUNTER — Encounter: Payer: Self-pay | Admitting: Surgery

## 2023-06-30 VITALS — BP 137/82 | HR 106 | Temp 97.9°F | Ht 67.5 in | Wt 210.0 lb

## 2023-06-30 DIAGNOSIS — K449 Diaphragmatic hernia without obstruction or gangrene: Secondary | ICD-10-CM | POA: Diagnosis not present

## 2023-06-30 DIAGNOSIS — R1310 Dysphagia, unspecified: Secondary | ICD-10-CM | POA: Diagnosis not present

## 2023-06-30 NOTE — Patient Instructions (Addendum)
Your CT and Barium Swallow is scheduled for 07/10/2023 2 pm (arrive by 1:45 pm) at [facility]. Barium Swallow will follow CT. Nothing to eat or drink 4 hours prior.   Hiatal Hernia  A hiatal hernia occurs when part of the stomach slides above the muscle that separates the abdomen from the chest (diaphragm). A person can be born with a hiatal hernia (congenital), or it may develop over time. In almost all cases of hiatal hernia, only the top part of the stomach pushes through the diaphragm. Many people have a hiatal hernia with no symptoms. The larger the hernia, the more likely it is that you will have symptoms. In some cases, a hiatal hernia allows stomach acid to flow back into the tube that carries food from your mouth to your stomach (esophagus). This may cause heartburn symptoms. The development of heartburn symptoms may mean that you have a condition called gastroesophageal reflux disease (GERD). What are the causes? This condition is caused by a weakness in the opening (hiatus) where the esophagus passes through the diaphragm to attach to the upper part of the stomach. A person may be born with a weakness in the hiatus, or a weakness can develop over time. What increases the risk? This condition is more likely to develop in: Older people. Age is a major risk factor for a hiatal hernia, especially if you are over the age of 26. Pregnant women. People who are overweight. People who have frequent constipation. What are the signs or symptoms? Symptoms of this condition usually develop in the form of GERD symptoms. Symptoms include: Heartburn. Upset stomach (indigestion). Trouble swallowing. Coughing or wheezing. Wheezing is making high-pitched whistling sounds when you breathe. Sore throat. Chest pain. Nausea and vomiting. How is this diagnosed? This condition may be diagnosed during testing for GERD. Tests that may be done include: X-rays of your stomach or chest. An upper  gastrointestinal (GI) series. This is an X-ray exam of your GI tract that is taken after you swallow a chalky liquid that shows up clearly on the X-ray. Endoscopy. This is a procedure to look into your stomach using a thin, flexible tube that has a tiny camera and light on the end of it. How is this treated? This condition may be treated by: Dietary and lifestyle changes to help reduce GERD symptoms. Medicines. These may include: Over-the-counter antacids. Medicines that make your stomach empty more quickly. Medicines that block the production of stomach acid (H2 blockers). Stronger medicines to reduce stomach acid (proton pump inhibitors). Surgery to repair the hernia, if other treatments are not helping. If you have no symptoms, you may not need treatment. Follow these instructions at home: Lifestyle and activity Do not use any products that contain nicotine or tobacco. These products include cigarettes, chewing tobacco, and vaping devices, such as e-cigarettes. If you need help quitting, ask your health care provider. Try to achieve and maintain a healthy body weight. Avoid putting pressure on your abdomen. Anything that puts pressure on your abdomen increases the amount of acid that may be pushed up into your esophagus. Avoid bending over, especially after eating. Raise the head of your bed by putting blocks under the legs. This keeps your head and esophagus higher than your stomach. Do not wear tight clothing around your chest or stomach. Try not to strain when having a bowel movement, when urinating, or when lifting heavy objects. Eating and drinking Avoid foods that can worsen GERD symptoms. These may include: Fatty foods,  like fried foods. Citrus fruits, like oranges or lemon. Other foods and drinks that contain acid, like orange juice or tomatoes. Spicy food. Chocolate. Eat frequent small meals instead of three large meals a day. This helps prevent your stomach from getting too  full. Eat slowly. Do not lie down right after eating. Do not eat 1-2 hours before bed. Do not drink beverages with caffeine. These include cola, coffee, cocoa, and tea. Do not drink alcohol. General instructions Take over-the-counter and prescription medicines only as told by your health care provider. Keep all follow-up visits. Your health care provider will want to check that any new prescribed medicines are helping your symptoms. Contact a health care provider if: Your symptoms are not controlled with medicines or lifestyle changes. You are having trouble swallowing. You have coughing or wheezing that will not go away. Your pain is getting worse. Your pain spreads to your arms, neck, jaw, teeth, or back. You feel nauseous or you vomit. Get help right away if: You have shortness of breath. You vomit blood. You have bright red blood in your stools. You have black, tarry stools. These symptoms may be an emergency. Get help right away. Call 911. Do not wait to see if the symptoms will go away. Do not drive yourself to the hospital. Summary A hiatal hernia occurs when part of the stomach slides above the muscle that separates the abdomen from the chest. A person may be born with a weakness in the hiatus, or a weakness can develop over time. Symptoms of a hiatal hernia may include heartburn, trouble swallowing, or sore throat. Management of a hiatal hernia includes eating frequent small meals instead of three large meals a day. Get help right away if you vomit blood, have bright red blood in your stools, or have black, tarry stools. This information is not intended to replace advice given to you by your health care provider. Make sure you discuss any questions you have with your health care provider. Document Revised: 08/28/2021 Document Reviewed: 08/28/2021 Elsevier Patient Education  2024 [employer].

## 2023-07-01 ENCOUNTER — Encounter: Payer: Self-pay | Admitting: Surgery

## 2023-07-01 NOTE — Progress Notes (Addendum)
Surgical Consultation    Colleen Washington is an 62 y.o. female.   Chief Complaint  Patient presents with   New Patient (Initial Visit)    Hiatal hernia     HPI:  Colleen Washington is a 62 year old female seen in consultation at the request of Dr.Wohl.  She endorses significant reflux worsening when she lays down.  She also endorses heartburn and some retrosternal pain. She states that PPI only partially controls her reflux.  No evidence of dysphagia.   She did have a recent endoscopy by Dr.Wohl I have personally reviewed showing evidence of medium hiatal hernia esophagitis.  She also had a CT several months ago for kidney stone that I have personally reviewed showing evidence of a type III paraesophageal hernia moderate size.  He did have some dense of mild diverticulitis. She was here for [facility].  She is able to perform more than 4 METS of activity without any shortness of breath or chest pain.  She was recently placed on a GLP P1 Prior history of hysterectomy She is smokes about a quarter of a pack a day  Past Medical History:  Diagnosis Date   allergic rhinitis    Anxiety    AR (allergic rhinitis)    Asthma    Depression    Endometriosis    GERD (gastroesophageal reflux disease)    H/O migraine    Herniated intervertebral disc    History of diverticulitis of colon    Hyperlipidemia    Hypertension    IBS (irritable bowel syndrome)    Irritable bowel syndrome    Nonobstructive CAD (coronary artery disease)    a. 11/2019 Cor CTA: Cor Ca2+ = 141 (95th %'ile). LAD 25-49p.   Psoriasis    Tobacco abuse     Past Surgical History:  Procedure Laterality Date   ABDOMINAL HYSTERECTOMY  2003   tvhuso   BREAST BIOPSY Left 08/2014   benign-BENIGN BREAST TISSUE WITH FIBROSIS   COLONOSCOPY WITH PROPOFOL N/A 05/22/2023   Procedure: COLONOSCOPY WITH PROPOFOL;  Surgeon: Midge Minium, MD;  Location: [facility] ENDOSCOPY;  Service: Endoscopy;  Laterality: N/A;   DILATION AND CURETTAGE OF UTERUS      ESOPHAGOGASTRODUODENOSCOPY (EGD) WITH PROPOFOL N/A 05/22/2023   Procedure: ESOPHAGOGASTRODUODENOSCOPY (EGD) WITH PROPOFOL;  Surgeon: Midge Minium, MD;  Location: [facility] ENDOSCOPY;  Service: Endoscopy;  Laterality: N/A;   POLYPECTOMY  05/22/2023   Procedure: POLYPECTOMY;  Surgeon: Midge Minium, MD;  Location: [facility] ENDOSCOPY;  Service: Endoscopy;;    Family History  Problem Relation Age of Onset   Arthritis Mother    Hyperlipidemia Mother    Cancer Father        colon   Diabetes Brother    Breast cancer Paternal Aunt 37   Arthritis Maternal Grandmother    Hyperlipidemia Maternal Grandfather    Heart disease Neg Hx    Colon cancer Neg Hx     Social History:  reports that she has been smoking cigarettes. She started smoking about 21 years ago. She has a 5 pack-year smoking history. She has never used smokeless tobacco. She reports current alcohol use. She reports that she does not use drugs.  Allergies:  Allergies  Allergen Reactions   Compazine [Prochlorperazine] Swelling    Medications reviewed.     ROS Full ROS performed and is otherwise negative other than what is stated in the HPI    BP 137/82   Pulse (!) 106   Temp 97.9 F (36.6 C) (Oral)   Ht  5' 7.5" (1.715 m)   Wt 210 lb (95.3 kg)   SpO2 98%   BMI 32.41 kg/m   Physical Exam Vitals and nursing note reviewed. Exam conducted with a chaperone present.  Constitutional:      General: She is not in acute distress.    Appearance: Normal appearance.  Neck:     Vascular: No carotid bruit.  Cardiovascular:     Rate and Rhythm: Normal rate and regular rhythm.     Heart sounds: No murmur heard. Pulmonary:     Effort: Pulmonary effort is normal. No respiratory distress.     Breath sounds: Normal breath sounds. No stridor. No wheezing or rhonchi.  Abdominal:     General: Abdomen is flat. There is no distension.     Palpations: There is no mass.     Tenderness: There is no abdominal tenderness. There is no guarding  or rebound.     Hernia: No hernia is present.  Musculoskeletal:        General: No swelling or tenderness. Normal range of motion.     Cervical back: Normal range of motion and neck supple. No rigidity or tenderness.  Lymphadenopathy:     Cervical: No cervical adenopathy.  Skin:    General: Skin is warm.     Capillary Refill: Capillary refill takes less than 2 seconds.  Neurological:     General: No focal deficit present.     Mental Status: She is alert and oriented to person, place, and time.  Psychiatric:        Mood and Affect: Mood normal.        Behavior: Behavior normal.        Thought Content: Thought content normal.        Judgment: Judgment normal.    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  Discussed with the patient in detail about her disease process.  I do think that we can start further workup and we will obtain a CT scan of the abdomen pelvis as well as a swallow evaluation.  I will be happy to see her after worse and further discuss surgical options.  We did talk about different modalities for therapy to include robotic repair of paraesophageal hernia.  I do think that her anatomy is amenable for this procedure.  I did talk to her about the risk the benefits and the possible complications.  She seems to be interested in moving forward.  Please note that I spent 55 minutes in this encounter including personal review of medical records, personally reviewing imaging studies, placing orders, counseling the patient and performing documentation. A copy of this report was sent to the referring provider  Sterling Big, MD [facility] General Surgeon

## 2023-07-07 ENCOUNTER — Other Ambulatory Visit: Payer: BC Managed Care – PPO

## 2023-07-10 ENCOUNTER — Ambulatory Visit
Admission: RE | Admit: 2023-07-10 | Discharge: 2023-07-10 | Disposition: A | Discharge status: Home / Self Care | Payer: BC Managed Care – PPO | Source: Ambulatory Visit | Attending: Surgery | Admitting: Surgery

## 2023-07-10 DIAGNOSIS — R1310 Dysphagia, unspecified: Secondary | ICD-10-CM

## 2023-07-10 DIAGNOSIS — K440 Diaphragmatic hernia with obstruction, without gangrene: Secondary | ICD-10-CM | POA: Diagnosis not present

## 2023-07-10 DIAGNOSIS — Z90710 Acquired absence of both cervix and uterus: Secondary | ICD-10-CM | POA: Diagnosis not present

## 2023-07-10 DIAGNOSIS — K449 Diaphragmatic hernia without obstruction or gangrene: Secondary | ICD-10-CM | POA: Diagnosis not present

## 2023-07-10 DIAGNOSIS — K5730 Diverticulosis of large intestine without perforation or abscess without bleeding: Secondary | ICD-10-CM | POA: Diagnosis not present

## 2023-07-10 MED ORDER — IOHEXOL 300 MG/ML  SOLN
100.0000 mL | Freq: Once | INTRAMUSCULAR | Status: AC | PRN
  Administered 2023-07-10: 100 mL via INTRAVENOUS

## 2023-07-21 ENCOUNTER — Other Ambulatory Visit: Payer: Self-pay | Admitting: Internal Medicine

## 2023-07-21 DIAGNOSIS — M955 Acquired deformity of pelvis: Secondary | ICD-10-CM | POA: Diagnosis not present

## 2023-07-21 DIAGNOSIS — M9905 Segmental and somatic dysfunction of pelvic region: Secondary | ICD-10-CM | POA: Diagnosis not present

## 2023-07-21 DIAGNOSIS — M62830 Muscle spasm of back: Secondary | ICD-10-CM | POA: Diagnosis not present

## 2023-07-21 DIAGNOSIS — M9903 Segmental and somatic dysfunction of lumbar region: Secondary | ICD-10-CM | POA: Diagnosis not present

## 2023-07-22 MED ORDER — PROMETHAZINE HCL 12.5 MG PO TABS: ORAL_TABLET | ORAL | 0 refills | Status: DC

## 2023-07-23 DIAGNOSIS — R053 Chronic cough: Secondary | ICD-10-CM | POA: Diagnosis not present

## 2023-07-23 DIAGNOSIS — J690 Pneumonitis due to inhalation of food and vomit: Secondary | ICD-10-CM | POA: Diagnosis not present

## 2023-07-24 ENCOUNTER — Telehealth: Payer: Self-pay | Admitting: Physician Assistant

## 2023-07-24 ENCOUNTER — Other Ambulatory Visit: Payer: Self-pay

## 2023-07-24 NOTE — Telephone Encounter (Signed)
 The patient called in and left a voicemail wanting to know if she needs to reschedule her appointment because she has pneumonia.I called her letting her know we received her message, and I canceled the patient appointment. She asked if she needs a follow up since she go see Dr. Jordis for her Thalia. Please advised the patient on what to do.

## 2023-07-25 ENCOUNTER — Telehealth: Payer: Self-pay

## 2023-07-25 NOTE — Telephone Encounter (Signed)
 Spoke with patient- she is following up with Dr.Pabon fpr paraesophageal hernia- possibly surgery and wanted to let us know- if she needs to be seen here she will call and let us know.

## 2023-07-28 ENCOUNTER — Ambulatory Visit: Payer: BC Managed Care – PPO | Admitting: Physician Assistant

## 2023-08-04 ENCOUNTER — Ambulatory Visit: Payer: BC Managed Care – PPO | Admitting: Surgery

## 2023-08-04 ENCOUNTER — Encounter: Payer: Self-pay | Admitting: Surgery

## 2023-08-04 VITALS — BP 119/82 | HR 111 | Temp 98.0°F | Ht 67.5 in | Wt 212.0 lb

## 2023-08-04 DIAGNOSIS — K449 Diaphragmatic hernia without obstruction or gangrene: Secondary | ICD-10-CM | POA: Diagnosis not present

## 2023-08-04 NOTE — Patient Instructions (Addendum)
Hiatal Hernia  A hiatal hernia occurs when part of the stomach slides above the muscle that separates the abdomen from the chest (diaphragm). A person can be born with a hiatal hernia (congenital), or it may develop over time. In almost all cases of hiatal hernia, only the top part of the stomach pushes through the diaphragm. Many people have a hiatal hernia with no symptoms. The larger the hernia, the more likely it is that you will have symptoms. In some cases, a hiatal hernia allows stomach acid to flow back into the tube that carries food from your mouth to your stomach (esophagus). This may cause heartburn symptoms. The development of heartburn symptoms may mean that you have a condition called gastroesophageal reflux disease (GERD). What are the causes? This condition is caused by a weakness in the opening (hiatus) where the esophagus passes through the diaphragm to attach to the upper part of the stomach. A person may be born with a weakness in the hiatus, or a weakness can develop over time. What increases the risk? This condition is more likely to develop in: Older people. Age is a major risk factor for a hiatal hernia, especially if you are over the age of 74. Pregnant women. People who are overweight. People who have frequent constipation. What are the signs or symptoms? Symptoms of this condition usually develop in the form of GERD symptoms. Symptoms include: Heartburn. Upset stomach (indigestion). Trouble swallowing. Coughing or wheezing. Wheezing is making high-pitched whistling sounds when you breathe. Sore throat. Chest pain. Nausea and vomiting. How is this diagnosed? This condition may be diagnosed during testing for GERD. Tests that may be done include: X-rays of your stomach or chest. An upper gastrointestinal (GI) series. This is an X-ray exam of your GI tract that is taken after you swallow a chalky liquid that shows up clearly on the X-ray. Endoscopy. This is a  procedure to look into your stomach using a thin, flexible tube that has a tiny camera and light on the end of it. How is this treated? This condition may be treated by: Dietary and lifestyle changes to help reduce GERD symptoms. Medicines. These may include: Over-the-counter antacids. Medicines that make your stomach empty more quickly. Medicines that block the production of stomach acid (H2 blockers). Stronger medicines to reduce stomach acid (proton pump inhibitors). Surgery to repair the hernia, if other treatments are not helping. If you have no symptoms, you may not need treatment. Follow these instructions at home: Lifestyle and activity Do not use any products that contain nicotine or tobacco. These products include cigarettes, chewing tobacco, and vaping devices, such as e-cigarettes. If you need help quitting, ask your health care provider. Try to achieve and maintain a healthy body weight. Avoid putting pressure on your abdomen. Anything that puts pressure on your abdomen increases the amount of acid that may be pushed up into your esophagus. Avoid bending over, especially after eating. Raise the head of your bed by putting blocks under the legs. This keeps your head and esophagus higher than your stomach. Do not wear tight clothing around your chest or stomach. Try not to strain when having a bowel movement, when urinating, or when lifting heavy objects. Eating and drinking Avoid foods that can worsen GERD symptoms. These may include: Fatty foods, like fried foods. Citrus fruits, like oranges or lemon. Other foods and drinks that contain acid, like orange juice or tomatoes. Spicy food. Chocolate. Eat frequent small meals instead of three large meals a  day. This helps prevent your stomach from getting too full. Eat slowly. Do not lie down right after eating. Do not eat 1-2 hours before bed. Do not drink beverages with caffeine. These include cola, coffee, cocoa, and tea. Do  not drink alcohol. General instructions Take over-the-counter and prescription medicines only as told by your health care provider. Keep all follow-up visits. Your health care provider will want to check that any new prescribed medicines are helping your symptoms. Contact a health care provider if: Your symptoms are not controlled with medicines or lifestyle changes. You are having trouble swallowing. You have coughing or wheezing that will not go away. Your pain is getting worse. Your pain spreads to your arms, neck, jaw, teeth, or back. You feel nauseous or you vomit. Get help right away if: You have shortness of breath. You vomit blood. You have bright red blood in your stools. You have black, tarry stools. These symptoms may be an emergency. Get help right away. Call 911. Do not wait to see if the symptoms will go away. Do not drive yourself to the hospital. Summary A hiatal hernia occurs when part of the stomach slides above the muscle that separates the abdomen from the chest. A person may be born with a weakness in the hiatus, or a weakness can develop over time. Symptoms of a hiatal hernia may include heartburn, trouble swallowing, or sore throat. Management of a hiatal hernia includes eating frequent small meals instead of three large meals a day. Get help right away if you vomit blood, have bright red blood in your stools, or have black, tarry stools. This information is not intended to replace advice given to you by your health care provider. Make sure you discuss any questions you have with your health care provider. Document Revised: 08/28/2021 Document Reviewed: 08/28/2021 Elsevier Patient Education  2024 [employer].

## 2023-08-05 ENCOUNTER — Ambulatory Visit: Payer: BC Managed Care – PPO | Admitting: Dermatology

## 2023-08-05 DIAGNOSIS — Z79899 Other long term (current) drug therapy: Secondary | ICD-10-CM

## 2023-08-05 DIAGNOSIS — D1801 Hemangioma of skin and subcutaneous tissue: Secondary | ICD-10-CM

## 2023-08-05 DIAGNOSIS — L814 Other melanin hyperpigmentation: Secondary | ICD-10-CM

## 2023-08-05 DIAGNOSIS — L409 Psoriasis, unspecified: Secondary | ICD-10-CM

## 2023-08-05 DIAGNOSIS — D492 Neoplasm of unspecified behavior of bone, soft tissue, and skin: Secondary | ICD-10-CM

## 2023-08-05 DIAGNOSIS — Z5181 Encounter for therapeutic drug level monitoring: Secondary | ICD-10-CM

## 2023-08-05 NOTE — Progress Notes (Signed)
Follow-Up Visit   Subjective  Colleen Washington is a 63 y.o. female who presents for the following: Yearly f/u on Psoriasis on her hands and feet, taking Skyrizi injection q 12 weeks with a good response, psoriasis under good control.  No side effects. The patient has spots, moles and lesions to be evaluated, some may be new or changing and the patient may have concern these could be cancer. Check scalp spot that gets irritated.   The following portions of the chart were reviewed this encounter and updated as appropriate: medications, allergies, medical history  Review of Systems:  No other skin or systemic complaints except as noted in HPI or Assessment and Plan.  Objective  Well appearing patient in no apparent distress; mood and affect are within normal limits.   A focused examination was performed of the following areas:face,hands,feet    Relevant exam findings are noted in the Assessment and Plan.  left temporal scalp 4.0 mm crusted red papule    Assessment & Plan   PSORIASIS on Systemic Treatment Exam: erythema with few deep seated vesicles on left plantar feet  Mild erythema on the hands, plantar feet with mild erythema and xerosis Less 1 % BSA.  Chronic condition with duration or expected duration over one year. Currently well-controlled.   patient denies joint pain  Labs ordered  CBC CMP QuantiFERON-TB skin test   Psoriasis - severe on systemic treatment.  Psoriasis is a chronic non-curable, but treatable genetic/hereditary disease that may have other systemic features affecting other organ systems such as joints (Psoriatic Arthritis).  It is linked with heart disease, inflammatory bowel disease, non-alcoholic fatty liver disease, and depression. Significant skin psoriasis and/or psoriatic arthritis may have significant symptoms and affects activities of daily activity and often benefits from systemic treatments.  These systemic treatments have some potential side  effects including immunosuppression and require pre-treatment laboratory screening and periodic laboratory monitoring and periodic in person evaluation and monitoring by the attending dermatologist physician (long term medication management).   Reviewed risks of biologics including immunosuppression, infections, injection site reaction, and failure to improve condition. Goal is control of skin condition, not cure.  Some older biologics such as Humira and Enbrel may slightly increase risk of malignancy and may worsen congestive heart failure.  Taltz and Cosentyx may cause inflammatory bowel disease to flare. The use of biologics requires long term medication management, including periodic office visits and monitoring of blood work.   Treatment Plan: Pending labs, cont Skyrizi sq injections q 12 wks Cont Enstilar foam qd aa hands, feet, elbows prn flares  Long term medication management.  Patient is using long term (months to years) prescription medication  to control their dermatologic condition.  These medications require periodic monitoring to evaluate for efficacy and side effects and may require periodic laboratory monitoring.     LENTIGO Left cheek  Exam: tan macule Due to sun exposure Treatment Plan: Benign-appearing, observe. Recommend daily broad spectrum sunscreen SPF 30+ to sun-exposed areas, reapply every 2 hours as needed.  Call for any changes   HEMANGIOMA Exam: red papule(s) scalp Discussed benign nature. Recommend observation. Call for changes.  NEOPLASM OF SKIN left temporal scalp Epidermal / dermal shaving  Lesion diameter (cm):  0.4 Informed consent: discussed and consent obtained   Patient was prepped and draped in usual sterile fashion: area prepped with alcohol. Anesthesia: the lesion was anesthetized in a standard fashion   Anesthetic:  1% lidocaine w/ epinephrine 1-100,000 buffered w/ 8.4% NaHCO3 Instrument  used: flexible razor blade   Hemostasis achieved with:  pressure, aluminum chloride and electrodesiccation   Outcome: patient tolerated procedure well   Post-procedure details: wound care instructions given   Post-procedure details comment:  Ointment and small bandage applied Specimen 1 - Surgical pathology Differential Diagnosis: R/O Irritated Hemangioma vs other  Check Margins: No Irritated Hemangioma vs other  PSORIASIS   Related Procedures Comprehensive metabolic panel CBC with Differential/Platelet QuantiFERON-TB Gold Plus  Return in about 6 months (around 02/02/2024) for Psoriasis .  I, Angelique Holm, CMA, am acting as scribe for Willeen Niece, MD .   Documentation: I have reviewed the above documentation for accuracy and completeness, and I agree with the above.  Willeen Niece, MD

## 2023-08-05 NOTE — Patient Instructions (Addendum)

## 2023-08-06 NOTE — Progress Notes (Signed)
Outpatient Surgical Follow Up  MELONIA HAW is an 63 y.o. female.   Chief Complaint  Patient presents with   Follow-up    Hiatal hernia    HPI: [name] is a 63 year old female seen in F/u for H/H and GERD.  She endorses significant reflux worsening when she lays down.  She also endorses heartburn and some retrosternal pain. She states that PPI only partially controls her reflux.  No evidence of dysphagia.   She did have a recent endoscopy by Dr.Wohl I have personally reviewed showing evidence of medium hiatal hernia esophagitis.  Swallow confimred HH w/o strictures  She also completed recent CT  that I have personally reviewed showing evidence of a type III paraesophageal hernia moderate size. 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  She is able to perform more than 4 METS of activity without any shortness of breath or chest pain.  She was recently placed on a GLP P1 Prior history of hysterectomy She is smokes about a quarter of a pack a day Rexenrlty diagnosed with pnuemonia and responded to antibiotics  Past Medical History:  Diagnosis Date   allergic rhinitis    Anxiety    AR (allergic rhinitis)    Asthma    Depression    Endometriosis    GERD (gastroesophageal reflux disease)    H/O migraine    Herniated intervertebral disc    History of diverticulitis of colon    Hyperlipidemia    Hypertension    IBS (irritable bowel syndrome)    Irritable bowel syndrome    Nonobstructive CAD (coronary artery disease)    a. 11/2019 Cor CTA: Cor Ca2+ = 141 (95th %'ile). LAD 25-49p.   Psoriasis    Tobacco abuse     Past Surgical History:  Procedure Laterality Date   ABDOMINAL HYSTERECTOMY  2003   tvhuso   BREAST BIOPSY Left 08/2014   benign-BENIGN BREAST TISSUE WITH FIBROSIS   COLONOSCOPY WITH PROPOFOL N/A 05/22/2023   Procedure: COLONOSCOPY WITH PROPOFOL;  Surgeon: Midge Minium, MD;  Location: [facility] ENDOSCOPY;   Service: Endoscopy;  Laterality: N/A;   DILATION AND CURETTAGE OF UTERUS     ESOPHAGOGASTRODUODENOSCOPY (EGD) WITH PROPOFOL N/A 05/22/2023   Procedure: ESOPHAGOGASTRODUODENOSCOPY (EGD) WITH PROPOFOL;  Surgeon: Midge Minium, MD;  Location: [facility] ENDOSCOPY;  Service: Endoscopy;  Laterality: N/A;   POLYPECTOMY  05/22/2023   Procedure: POLYPECTOMY;  Surgeon: Midge Minium, MD;  Location: [facility] ENDOSCOPY;  Service: Endoscopy;;    Family History  Problem Relation Age of Onset   Arthritis Mother    Hyperlipidemia Mother    Cancer Father        colon   Diabetes Brother    Breast cancer Paternal Aunt 83   Arthritis Maternal Grandmother    Hyperlipidemia Maternal Grandfather    Heart disease Neg Hx    Colon cancer Neg Hx     Social History:  reports that she has been smoking cigarettes. She started smoking about 21 years ago. She has a 5 pack-year smoking history. She has never used smokeless tobacco. She reports current alcohol use. She reports that she does not use drugs.  Allergies:  Allergies  Allergen Reactions   Compazine [Prochlorperazine] Swelling    Medications reviewed.    ROS Full ROS performed and is otherwise negative other than what is stated in HPI   BP 119/82   Pulse (!) 111   Temp 98 F (36.7 C) (Oral)  Ht 5' 7.5" (1.715 m)   Wt 212 lb (96.2 kg)   SpO2 97%   BMI 32.71 kg/m   Physical Exam  Physical Exam Vitals and nursing note reviewed. Exam conducted with a chaperone present.  Constitutional:      General: She is not in acute distress.    Appearance: Normal appearance.  Neck:     Vascular: No carotid bruit.  Cardiovascular:     Rate and Rhythm: Normal rate and regular rhythm.     Heart sounds: No murmur heard. Pulmonary:     Effort: Pulmonary effort is normal. No respiratory distress.     Breath sounds: Normal breath sounds. No stridor. No wheezing or rhonchi.  Abdominal:     General: Abdomen is flat. There is no distension.     Palpations: There  is no mass.     Tenderness: There is no abdominal tenderness. There is no guarding or rebound.     Hernia: No hernia is present.  Musculoskeletal:        General: No swelling or tenderness. Normal range of motion.     Cervical back: Normal range of motion and neck supple. No rigidity or tenderness.  Lymphadenopathy:     Cervical: No cervical adenopathy.  Skin:    General: Skin is warm.     Capillary Refill: Capillary refill takes less than 2 seconds.  Neurological:     General: No focal deficit present.     Mental Status: She is alert and oriented to person, place, and time.  Psychiatric:        Mood and Affect: Mood normal.        Behavior: Behavior normal.        Thought Content: Thought content normal.        Judgment: Judgment normal.   Assessment/Plan:  63 year old female with type III paraesophageal hernia.  I Discussed with the patient in detail about her disease process. Work up home from hiatal hernia without any contraindication for repair.  I do think that she will be a good candidate for robotic repair and Nissen fundoplication .we did talk about different modalities for therapy to include robotic repair of paraesophageal hernia.  I do think that her anatomy is amenable for this procedure.  I did talk to her about the risk the benefits and the possible complications.  She seems to be interested in moving forward but she wishes to do it towards April.  We will see her back in the office in a couple months and at that time we will pencil down a final date Please note that I spent 30 minutes in this encounter including personal review of medical records, personally reviewing imaging studies, placing orders, counseling the patient and performing documentation. A copy of this report was sent to the referring provider Sterling Big, MD [facility] General Surgeon

## 2023-08-07 LAB — SURGICAL PATHOLOGY

## 2023-08-09 LAB — QUANTIFERON-TB GOLD PLUS
QuantiFERON Mitogen Value: >10 [IU]/mL
QuantiFERON Nil Value: 0.03 [IU]/mL
QuantiFERON TB1 Ag Value: 0.05 [IU]/mL
QuantiFERON TB2 Ag Value: 0.04 [IU]/mL
QuantiFERON-TB Gold Plus: NEGATIVE

## 2023-08-09 LAB — COMPREHENSIVE METABOLIC PANEL
ALT: 23 [IU]/L (ref 0–32)
AST: 21 [IU]/L (ref 0–40)
Albumin: 4.4 g/dL (ref 3.9–4.9)
Alkaline Phosphatase: 76 [IU]/L (ref 44–121)
BUN/Creatinine Ratio: 15 (ref 12–28)
BUN: 13 mg/dL (ref 8–27)
Bilirubin Total: 0.5 mg/dL (ref 0.0–1.2)
CO2: 24 mmol/L (ref 20–29)
Calcium: 9.3 mg/dL (ref 8.7–10.3)
Chloride: 103 mmol/L (ref 96–106)
Creatinine, Ser: 0.89 mg/dL (ref 0.57–1.00)
Globulin, Total: 2.5 g/dL (ref 1.5–4.5)
Glucose: 97 mg/dL (ref 70–99)
Potassium: 4.1 mmol/L (ref 3.5–5.2)
Sodium: 141 mmol/L (ref 134–144)
Total Protein: 6.9 g/dL (ref 6.0–8.5)
eGFR: 74 mL/min/{1.73_m2} (ref >59)

## 2023-08-09 LAB — CBC WITH DIFFERENTIAL/PLATELET
Basophils Absolute: 0 10*3/uL (ref 0.0–0.2)
Basos: 1 %
EOS (ABSOLUTE): 0.3 10*3/uL (ref 0.0–0.4)
Eos: 5 %
Hematocrit: 39.5 % (ref 34.0–46.6)
Hemoglobin: 13.1 g/dL (ref 11.1–15.9)
Immature Grans (Abs): 0 10*3/uL (ref 0.0–0.1)
Immature Granulocytes: 0 %
Lymphocytes Absolute: 2.7 10*3/uL (ref 0.7–3.1)
Lymphs: 44 %
MCH: 29.1 pg (ref 26.6–33.0)
MCHC: 33.2 g/dL (ref 31.5–35.7)
MCV: 88 fL (ref 79–97)
Monocytes Absolute: 0.6 10*3/uL (ref 0.1–0.9)
Monocytes: 9 %
Neutrophils Absolute: 2.5 10*3/uL (ref 1.4–7.0)
Neutrophils: 41 %
Platelets: 186 10*3/uL (ref 150–450)
RBC: 4.5 x10E6/uL (ref 3.77–5.28)
RDW: 12.4 % (ref 11.7–15.4)
WBC: 6 10*3/uL (ref 3.4–10.8)

## 2023-08-11 ENCOUNTER — Telehealth: Payer: Self-pay

## 2023-08-11 MED ORDER — SKYRIZI PEN 150 MG/ML ~~LOC~~ SOAJ: 150.0000 mg | SUBCUTANEOUS | 2 refills | Status: DC

## 2023-08-11 NOTE — Telephone Encounter (Signed)
Advised pt of bx results and lab results.  Refills of Skyrizi sent to pharmacy.Colleen Washington

## 2023-08-11 NOTE — Telephone Encounter (Signed)
-----   Message from Willeen Niece sent at 08/11/2023  1:18 PM EST ----- 1. Skin, left temporal scalp :       HEMANGIOMA  Benign  Labs ok, TB negative, send in 6 mos Skyrizi rfs please call patient

## 2023-08-12 DIAGNOSIS — M9905 Segmental and somatic dysfunction of pelvic region: Secondary | ICD-10-CM | POA: Diagnosis not present

## 2023-08-12 DIAGNOSIS — M62830 Muscle spasm of back: Secondary | ICD-10-CM | POA: Diagnosis not present

## 2023-08-12 DIAGNOSIS — M955 Acquired deformity of pelvis: Secondary | ICD-10-CM | POA: Diagnosis not present

## 2023-08-12 DIAGNOSIS — M9903 Segmental and somatic dysfunction of lumbar region: Secondary | ICD-10-CM | POA: Diagnosis not present

## 2023-09-08 DIAGNOSIS — M9903 Segmental and somatic dysfunction of lumbar region: Secondary | ICD-10-CM | POA: Diagnosis not present

## 2023-09-08 DIAGNOSIS — M9905 Segmental and somatic dysfunction of pelvic region: Secondary | ICD-10-CM | POA: Diagnosis not present

## 2023-09-08 DIAGNOSIS — M955 Acquired deformity of pelvis: Secondary | ICD-10-CM | POA: Diagnosis not present

## 2023-09-08 DIAGNOSIS — M62830 Muscle spasm of back: Secondary | ICD-10-CM | POA: Diagnosis not present

## 2023-10-02 DIAGNOSIS — M79652 Pain in left thigh: Secondary | ICD-10-CM | POA: Diagnosis not present

## 2023-10-02 DIAGNOSIS — R350 Frequency of micturition: Secondary | ICD-10-CM | POA: Diagnosis not present

## 2023-10-02 DIAGNOSIS — M79662 Pain in left lower leg: Secondary | ICD-10-CM | POA: Diagnosis not present

## 2023-10-06 ENCOUNTER — Encounter: Payer: Self-pay | Admitting: Surgery

## 2023-10-06 ENCOUNTER — Ambulatory Visit: Payer: BC Managed Care – PPO | Admitting: Surgery

## 2023-10-06 VITALS — BP 128/88 | HR 90 | Temp 98.0°F | Ht 67.5 in | Wt 217.6 lb

## 2023-10-06 DIAGNOSIS — M62830 Muscle spasm of back: Secondary | ICD-10-CM | POA: Diagnosis not present

## 2023-10-06 DIAGNOSIS — M9903 Segmental and somatic dysfunction of lumbar region: Secondary | ICD-10-CM | POA: Diagnosis not present

## 2023-10-06 DIAGNOSIS — M955 Acquired deformity of pelvis: Secondary | ICD-10-CM | POA: Diagnosis not present

## 2023-10-06 DIAGNOSIS — K449 Diaphragmatic hernia without obstruction or gangrene: Secondary | ICD-10-CM

## 2023-10-06 DIAGNOSIS — M9905 Segmental and somatic dysfunction of pelvic region: Secondary | ICD-10-CM | POA: Diagnosis not present

## 2023-10-06 NOTE — Patient Instructions (Signed)
 We have spoken today about repairing your Hiatal Hernia. Your surgery will be scheduled at [facility] with Dr. Everlene Farrier.  Plan to be in the hospital for 1-2 days if the minimally invasive surgery is completed without having to make a bigger incision. If the bigger incision is made, you will most likely need to be in the hospital 4-6 days. You will be on a soft diet and need to recover for 2 weeks following your surgery prior to doing any of your normal activities. At the 2 week mark, we will see you in the office and if you are doing ok we will advance your diet and activity level as you tolerate.  Please see your Blue (Pre-care) Sheet for more information regarding your surgery. Our surgery scheduler will call you to verify surgery date and to go over information  You will need to arrange to be out of work for approximately 1-2 weeks and then you may return with a lifting restriction for 4 more weeks. If you have FMLA or Disability paperwork that needs to be filled out, please have your company fax your paperwork to [phone] or you may drop this by either office. This paperwork will be filled out within 3 days after your surgery has been completed.  Please call our office with any questions or concerns that you have regarding your surgery and recovery.    Laparoscopic Nissen Fundoplication Laparoscopic Nissen fundoplication is surgery to relieve heartburn and other problems caused by gastric fluids flowing up into your esophagus. The esophagus is the tube that carries food and liquid from your throat to your stomach. Normally, the muscle that sits between your stomach and your esophagus (lower esophageal sphincter or LES) keeps stomach fluids in your stomach. In some people, the LES does not work properly, and stomach fluids flow up into the esophagus. This can happen when part of the stomach bulges through the LES (hiatal hernia). The backward flow of stomach fluids can cause a type of severe and  long-standing heartburn that is called gastroesophageal reflux disease (GERD). You may need this surgery if other treatments for GERD have not helped. In a laparoscopic Nissen fundoplication, the upper part of your stomach is wrapped around the lower part of your esophagus to strengthen the LES and prevent reflux. If you have a hiatal hernia, it will also be repaired with this surgery. The procedure is done through several small incisions in your abdomen. It is performed using a thin, telescopic instrument (laparoscope) and other instruments that can pass through the scope or through other small incisions. Tell a health care provider about: Any allergies you have. All medicines you are taking, including vitamins, herbs, eye drops, creams, and over-the-counter medicines. Any problems you or family members have had with anesthetic medicines. Any blood disorders you have. Any surgeries you have had. Any medical conditions you have. What are the risks? Generally, this is a safe procedure. However, problems may occur, including: Difficulty swallowing (dysphagia). Bloating. Nausea or vomiting. Damage to the lung, causing a collapsed lung. Infection or bleeding. What happens before the procedure? Ask your health care provider about: Changing or stopping your regular medicines. This is especially important if you are taking diabetes medicines or blood thinners. Taking medicines such as aspirin and ibuprofen. These medicines can thin your blood. Do not take these medicines before your procedure if your health care provider asks you not to. Follow your health care provider's instructions about eating or drinking restrictions. Plan to have someone take  you home after the procedure. What happens during the procedure? An IV tube will be inserted into one of your veins. It will be used to give you fluids and medicines during the procedure. You will be given a medicine that makes you fall asleep (general  anesthetic). Your abdomen will be cleaned with a germ-killing solution (antiseptic). The surgeon will make a small incision in your abdomen and insert a tube through the incision. Your abdomen will be filled with a gas. This helps the surgeon to see your organs more easily and it makes more space to work. The surgeon will insert the laparoscope through the incision. The scope has a camera that will send pictures to a monitor in the operating room. The surgeon will make several other small incisions in your abdomen to insert the other instruments that are needed during the procedure. Another instrument (dilator) will be passed through your mouth and down your esophagus into the upper part of your stomach. The dilator will prevent your LES from being closed too tightly during surgery. The surgeon will pass the top portion of your stomach behind the lower part of your esophagus and wrap it all the way around. This will be stitched into place. If you have a hiatal hernia, it will be repaired during this procedure. All instruments will be removed, and your incisions will be closed under your skin with stitches (sutures). Skin adhesive strips may also be used. A bandage (dressing) will be placed on your skin over the incisions. The procedure may vary among health care providers and hospitals. What happens after the procedure? You will be moved to a recovery area. Your blood pressure, heart rate, breathing rate, and blood oxygen level will be monitored often until the medicines you were given have worn off. You will be given pain medicine as needed. Your IV tube will be kept in until you are able to drink fluids. This information is not intended to replace advice given to you by your health care provider. Make sure you discuss any questions you have with your health care provider. Document Released: 07/22/2014 Document Revised: 12/07/2015 Document Reviewed: 03/02/2014 Elsevier Interactive Patient  Education  2017 Elsevier Inc.   Laparoscopic Nissen Fundoplication, Care After Refer to this sheet in the next few weeks. These instructions provide you with information about caring for yourself after your procedure. Your health care provider may also give you more specific instructions. Your treatment has been planned according to current medical practices, but problems sometimes occur. Call your health care provider if you have any problems or questions after your procedure. What can I expect after the procedure? After the procedure, it is common to have: Difficulty swallowing (dysphagia). Excess gas (bloating). Follow these instructions at home: Medicines  Take medicines only as directed by your health care provider. Do not drive or operate heavy machinery while taking pain medicine. Incision care  There are many different ways to close and cover an incision, including stitches (sutures), skin glue, and adhesive strips. Follow your health care provider's instructions about: Incision care. Bandage (dressing) changes and removal. Incision closure removal. Check your incision areas every day for signs of infection. Watch for: Redness, swelling, or pain. Fluid, blood, or pus. Do not take baths, swim, or use a hot tub until your health care provider approves. Take showers as directed by your health care provider. Eating and drinking  Follow your health care provider's instructions about eating. You may need to follow a very soft diet for 2  weeks, followed by a diet of more regular foods for 2 weeks, no breads. You should return to your usual diet gradually. Drink enough fluid to keep your urine clear or pale yellow. Activity  Return to your normal activities as directed by your health care provider. Ask your health care provider what activities are safe for you. Avoid strenuous exercise. Do not lift anything that is heavier than 10 lb (4.5 kg). Ask your health care provider when you  can: Return to sexual activity. Drive. Go back to work. Contact a health care provider if: You have a fever. Your pain gets worse or is not helped by medicine. You have frequent nausea or vomiting. You have continued abdominal bloating. You have an ongoing (persistent) cough. You have redness, swelling, or pain in any incision areas. You have fluid, blood, or pus coming from any incisions. Get help right away if: You have trouble breathing. You are unable to swallow. You have persistent vomiting. You have blood in your vomit. You have severe abdominal pain. This information is not intended to replace advice given to you by your health care provider. Make sure you discuss any questions you have with your health care provider. Document Released: 02/22/2004 Document Revised: 12/07/2015 Document Reviewed: 03/02/2014 Elsevier Interactive Patient Education  2017 Elsevier Inc.   Diet After Nissen Fundoplication Surgery This diet information is for patients who have recently had Nissen fundoplication surgery to correct reflux disease or to repair various types of hernias, such as hiatal hernia and intrathoracic stomach. This diet may also be used for other gastrointestinal surgeries, such as Heller myotomy and repair of achalasia. The diet will help control diarrhea, excess gas and swallowing problems, which may occur after this type of surgery. Keeping Your Stomach from Stretching Eat small, frequent meals (six to eight per day). This will help you consume the majority of the nutrients you need without causing your stomach to feel full or distended.  Drinking large amounts of fluids with meals can stretch your stomach. You may drink fluids between meals as often as you like, but limit fluids to 1/2 cup (4 fluid ounces) with meals and one cup (8 fluid ounces) with snacks.  Sit upright while eating and stay upright for 30 minutes after each meal. Gravity can help food move through your digestive  tract. Do not lie down after eating. Sit upright for 2 hours after your last meal or snack of the day.  Eat very slowly. Take your time when eating.  Take small bites and chew your food well to help aid in swallowing and digestion.  Avoid crusty breads and sticky, gummy foods, such as bananas, fresh doughy breads, rolls and doughnuts. These types of foods become sticky and difficult to swallow.  Toasted breads tend to be better tolerated.  Lastly, if you eat sweets, consume them at the end of your meal to avoid a group of symptoms referred to as "dumping syndrome". This describes the rapid emptying of foods from the stomach to the small intestine. Sweetened beverages, candy and desserts move more rapidly and dump quickly into the intestines. This can cause symptoms of nausea, weakness, cold sweats, cramps, diarrhea and dizzy spells.  Avoiding Gas Avoid drinking through a straw. Do not chew gum or tobacco. These actions cause you to swallow air, which produces excess gas in your stomach. Chew with your mouth closed.  Avoid any foods that cause stomach gas and distention. These foods include corn, dried beans, peas, lentils, onions, broccoli, cauliflower  and any food from the cabbage family.  Avoid carbonated drinks, alcohol, citrus and tomato products.  When will I be able to eat a soft diet? After Nissen fundoplication surgery, your diet will be advanced slowly by your surgeon. Generally, you will be on a clear liquid diet for the first few meals. Then you will advance to the full liquid diet for a meal or two and eventually to a Nissen soft diet. Please be aware that each patient's tolerance to food is different. Your doctor will advance your diet depending on how well you progress after surgery. Clear Liquid Diet  The first diet after surgery is the clear liquid diet. It includes the following liquids: Apple juice  Cranberry juice  Grape juice  Chicken broth  Beef broth  Flavored gelatin  (Jell-O)  Decaf tea and coffee  Caffeinated beverages are permitted based on tolerance  Popsicles  [place] ice Carbonated drinks (sodas) are not allowed for the first six to eight weeks after surgery. After this time you can try them again in small amounts.  Full Liquid Diet The full liquid diet contains anything on the clear liquid diet, plus: Milk, soy, rice and almond (no chocolate)  Cream of wheat, cream of rice, grits  Strained creamed soups (no tomato or broccoli)  Vanilla and strawberry-flavored ice cream  Sherbet  Blended, custard styled or whipped yogurt (plain or vanilla only)  Vanilla and butterscotch pudding (no chocolate or coconut)  Nutritional drinks including Ensure, Boost, Carnation Instant Breakfast (no chocolate-flavored) Note: Dairy products, such as milk, ice cream and pudding, may cause diarrhea in some people just after surgery. You may need to avoid milk products. If so, substitute them with lactose-free beverages, such as soy, rice, Lactaid or almond milks.  Nissen Soft Diet Food Category Foods to Choose Foods to Avoid  Beverages Milk, such as, whole, 2%, 1%, non-fat, or skim, soy, rice, almond  Caffeinated and decaf tea and coffee  Powdered drink mixes (in moderation)  Non-citrus juices (apple, grape, cranberry or blends of these)  Fruit nectars  Nutritional drinks including Boost, Ensure, Carnation Instant Breakfast Chocolate milk, cocoa or other chocolate-flavored drinks  Carbonated drinks  Alcohol  Citrus juices like orange, grapefruit, lemon and lime  Breads Crackers (saltine, butter, soda, graham, Goldfish and Cheese Nips)   Untoasted bread, bagels, [place] and hard rolls, English muffins  Crackers with nuts, seeds, fresh or dried fruit, coconut, or highly seasoned, such as garlic or onion-flavored  Sweet rolls, coffee cake or doughnuts  Cereals Well cooked cereals, such as oatmeal (plain or flavored)  Cold cereal (Cornflakes, Rice  Krispies, Cheerios, Special K plain, Rice Chex and puffed rice) Very coarse cereal, such as bran, shredded wheat  Any cereal with fresh or dried fruit, coconut, seeds or nuts  Desserts Eat in moderation and do not eat desserts or sweets by themselves. Plain cakes, cookies and cream-filled pies  Vanilla and butterscotch pudding or custard  Ice cream, ice milk, frozen yogurt and sherbet  Gelatin made from allowed foods  Fruit ices and popsicles Desserts containing chocolate, coconut, nuts, seeds, fresh or dried fruit, peppermint or spearmint  Eggs  Poached, hard boiled or scrambled Fried eggs and highly seasoned eggs (deviled eggs)  Fats Eat in moderation. Butter and margarine  Mayonnaise and vegetable oils  Mildly seasoned cream sauces and gravies  Plain cream cheese  Sour cream Highly seasoned salad dressings, cream sauces and gravies  Bacon, bacon fat, ham fat, lard and salt pork  Foy [place]  foods  Nuts  Fruits Fruit juice  Any canned or cooked fruit except those listed in the AVOID column ALL fresh fruits, such as citrus, apples, and pineapple  Canned pineapple  Dried fruits, such as raisins, berries  Fruits with seeds, such as berries, kiwi and figs  Meat, Fish, Poultry, and [employer] may be ground, minced or chopped to ease swallowing and digestion  Tender, well cooked and moist cuts of beef, chicken, [place] and pork  Veal and lamb  Flaky, cooked fish  Canned tuna  Cottage and ricotta cheeses  Mild cheese, such as American, brick, mozzarella and baby Swiss  Creamy peanut butter  Plain custard or blended fruit yogurt  Moist casseroles, such as macaroni & cheese, tuna noodle  Grilled or toasted cheese sandwich Tough meats with a lot of gristle  Fried, highly seasoned, smoked and fatty meat, fish or poultry, such as frankfurters, luncheon meats, sausage, bacon, spare ribs, beef brisket, sardines, anchovies, duck and goose  Chili and other entrees made with pepper or  chili pepper  Shellfish  Strongly flavored cheeses, such as sharp cheese, extra sharp cheddar, cheese containing peppers or other seasonings  Crunchy peanut butter  Any yogurt with nuts, seeds, coconut, strawberries or raspberries  Potatoes and Starches Peeled, mashed or boiled white or sweet potatoes  Oven-baked potatoes without skin  Well cooked white rice, enriched noodles, barley, spaghetti, macaroni and other pastas Fried potatoes, potato skins and potato chips  Hard and soft taco shells  Fried, brown or wild rice  Soups Mildly flavored meat stocks  Cream soups made from allowed foods Highly seasoned soups and tomato based soups, cream soups made with gas producing vegetables, such as broccoli, cauliflower, onion, etc.  Sweets and Snacks Use in moderation and do not eat large amounts of sweets by themselves. Syrup, honey, jelly and seedless jam  Plain hard candies and plain candies made with allowed ingredients  Molasses  Marshmallows  Other candy made from allowed ingredients  Thin pretzels Jam, marmalade and preserves  Chocolate in any form  Any candy containing nuts, coconut, seeds, peppermint, spearmint or dried or fresh fruit  Popcorn, potato chips, tortilla chips  Soft or hard thick pretzels, such as sourdough  Vegetables Well cooked soft vegetables without seeds or skins, such as asparagus tips, beets, carrots, green and wax beans, chopped spinach, tender canned baby peas, squash and pumpkin Raw vegetables, tomatoes, tomato juice, tomato sauce and V-8 juice(tomato based products can irritate the stomach)  Gas producing vegetables, such as broccoli, Brussel sprouts, cabbage, cauliflower, onions, corn, cucumber, green peppers, rutabagas, turnips, radishes and sauerkraut  Dried beans, peas and lentils  Miscellaneous Salt and spices in moderation  Mustard and vinegar in moderation Fried or highly seasoned foods  Coconut and seeds  Pickles and olives  Chili sauces, ketchup,  barbecue sauce, horseradish, black pepper, chili powder and onion and garlic seasonings  Any other strongly flavored seasoning, condiment, spice or herb not tolerated  Any food not tolerated

## 2023-10-07 ENCOUNTER — Telehealth: Payer: Self-pay | Admitting: Surgery

## 2023-10-07 NOTE — Telephone Encounter (Signed)
 Patient has been advised of Pre-Admission date/time, and Surgery date at [facility].  Surgery Date: 10/21/23 Preadmission Testing Date: 10/13/23 (phone 1p-4p)  Patient has been made aware to call [phone], between 1-3:00pm the day before surgery, to find out what time to arrive for surgery.

## 2023-10-09 NOTE — H&P (View-Only) (Signed)
 Outpatient Surgical Follow Up  10/09/2023  Colleen Washington is an 63 y.o. female.   Chief Complaint  Patient presents with   Follow-up    Hiatal hernia    HPI: Colleen Washington is a 63 year old female seen in F/u for H/H and GERD.  She endorses significant reflux worsening when she lays down.  She also endorses heartburn and some retrosternal pain. She states that PPI only partially controls her reflux.  No evidence of dysphagia.   She did have a recent endoscopy by Dr.Wohl I have personally reviewed showing evidence of medium hiatal hernia esophagitis.  Swallow confimred HH w/o strictures   She also completed recent CT  that I have personally reviewed showing evidence of a type III paraesophageal hernia moderate size. 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  She is able to perform more than 4 METS of activity without any shortness of breath or chest pain.  She was recently placed on a GLP P1 Prior history of hysterectomy She is smokes about a quarter of a pack a day  Past Medical History:  Diagnosis Date   allergic rhinitis    Anxiety    AR (allergic rhinitis)    Asthma    Depression    Endometriosis    GERD (gastroesophageal reflux disease)    H/O migraine    Herniated intervertebral disc    History of diverticulitis of colon    Hyperlipidemia    Hypertension    IBS (irritable bowel syndrome)    Irritable bowel syndrome    Nonobstructive CAD (coronary artery disease)    a. 11/2019 Cor CTA: Cor Ca2+ = 141 (95th %'ile). LAD 25-49p.   Psoriasis    Tobacco abuse     Past Surgical History:  Procedure Laterality Date   ABDOMINAL HYSTERECTOMY  2003   tvhuso   BREAST BIOPSY Left 08/2014   benign-BENIGN BREAST TISSUE WITH FIBROSIS   COLONOSCOPY WITH PROPOFOL N/A 05/22/2023   Procedure: COLONOSCOPY WITH PROPOFOL;  Surgeon: Midge Minium, MD;  Location: [facility] ENDOSCOPY;  Service: Endoscopy;  Laterality: N/A;   DILATION AND  CURETTAGE OF UTERUS     ESOPHAGOGASTRODUODENOSCOPY (EGD) WITH PROPOFOL N/A 05/22/2023   Procedure: ESOPHAGOGASTRODUODENOSCOPY (EGD) WITH PROPOFOL;  Surgeon: Midge Minium, MD;  Location: [facility] ENDOSCOPY;  Service: Endoscopy;  Laterality: N/A;   POLYPECTOMY  05/22/2023   Procedure: POLYPECTOMY;  Surgeon: Midge Minium, MD;  Location: [facility] ENDOSCOPY;  Service: Endoscopy;;    Family History  Problem Relation Age of Onset   Arthritis Mother    Hyperlipidemia Mother    Cancer Father        colon   Diabetes Brother    Breast cancer Paternal Aunt 52   Arthritis Maternal Grandmother    Hyperlipidemia Maternal Grandfather    Heart disease Neg Hx    Colon cancer Neg Hx     Social History:  reports that she has been smoking cigarettes. She started smoking about 21 years ago. She has a 5 pack-year smoking history. She has never used smokeless tobacco. She reports current alcohol use. She reports that she does not use drugs.  Allergies:  Allergies  Allergen Reactions   Compazine [Prochlorperazine] Swelling    Medications reviewed.    ROS Full ROS performed and is otherwise negative other than what is stated in HPI   BP 128/88   Pulse 90   Temp 98 F (36.7 C) (Oral)   Ht 5' 7.5" (1.715  m)   Wt 217 lb 9.6 oz (98.7 kg)   SpO2 97%   BMI 33.58 kg/m   Physical Exam   Physical Exam Vitals and nursing note reviewed. Exam conducted with a chaperone present.  Constitutional:      General: She is not in acute distress.    Appearance: Normal appearance.  Neck:     Vascular: No carotid bruit.  Cardiovascular:     Rate and Rhythm: Normal rate and regular rhythm.     Heart sounds: No murmur heard. Pulmonary:     Effort: Pulmonary effort is normal. No respiratory distress.     Breath sounds: Normal breath sounds. No stridor. No wheezing or rhonchi.  Abdominal:     General: Abdomen is flat. There is no distension.     Palpations: There is no mass.     Tenderness: There is no abdominal  tenderness. There is no guarding or rebound.     Hernia: No hernia is present.  Musculoskeletal:        General: No swelling or tenderness. Normal range of motion.     Cervical back: Normal range of motion and neck supple. No rigidity or tenderness.  Lymphadenopathy:     Cervical: No cervical adenopathy.  Skin:    General: Skin is warm.     Capillary Refill: Capillary refill takes less than 2 seconds.  Neurological:     General: No focal deficit present.     Mental Status: She is alert and oriented to person, place, and time.  Psychiatric:        Mood and Affect: Mood normal.        Behavior: Behavior normal.        Thought Content: Thought content normal.        Judgment: Judgment normal.     Assessment/Plan: 63 year old female with type III paraesophageal hernia.  I Discussed with the patient in detail about her disease process.  I do think that she will be a good candidate for robotic repair and Nissen fundoplication .we did talk about different modalities for therapy to include robotic repair of paraesophageal hernia.  I do think that her anatomy is amenable for this procedure.  I did talk to her about the risk the benefits and the possible complications.  She seems to be interested in moving forward , I did discuss in detail about diet modification post op.  Please note that I spent 40 minutes in this encounter including personal review of medical records, personally reviewing imaging studies, placing orders, counseling the patient and performing documentation  Sterling Big, MD [facility] General Surgeon

## 2023-10-09 NOTE — Progress Notes (Signed)
 Outpatient Surgical Follow Up  10/09/2023  Colleen Washington is an 63 y.o. female.   Chief Complaint  Patient presents with   Follow-up    Hiatal hernia    HPI: Colleen Washington is a 63 year old female seen in F/u for H/H and GERD.  She endorses significant reflux worsening when she lays down.  She also endorses heartburn and some retrosternal pain. She states that PPI only partially controls her reflux.  No evidence of dysphagia.   She did have a recent endoscopy by Dr.Wohl I have personally reviewed showing evidence of medium hiatal hernia esophagitis.  Swallow confimred HH w/o strictures   She also completed recent CT  that I have personally reviewed showing evidence of a type III paraesophageal hernia moderate size. 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  She is able to perform more than 4 METS of activity without any shortness of breath or chest pain.  She was recently placed on a GLP P1 Prior history of hysterectomy She is smokes about a quarter of a pack a day  Past Medical History:  Diagnosis Date   allergic rhinitis    Anxiety    AR (allergic rhinitis)    Asthma    Depression    Endometriosis    GERD (gastroesophageal reflux disease)    H/O migraine    Herniated intervertebral disc    History of diverticulitis of colon    Hyperlipidemia    Hypertension    IBS (irritable bowel syndrome)    Irritable bowel syndrome    Nonobstructive CAD (coronary artery disease)    a. 11/2019 Cor CTA: Cor Ca2+ = 141 (95th %'ile). LAD 25-49p.   Psoriasis    Tobacco abuse     Past Surgical History:  Procedure Laterality Date   ABDOMINAL HYSTERECTOMY  2003   tvhuso   BREAST BIOPSY Left 08/2014   benign-BENIGN BREAST TISSUE WITH FIBROSIS   COLONOSCOPY WITH PROPOFOL N/A 05/22/2023   Procedure: COLONOSCOPY WITH PROPOFOL;  Surgeon: Midge Minium, MD;  Location: [facility] END[facility];  Service: Endoscopy;  Laterality: N/A;   DILATION AND  CURETTAGE OF UTERUS     ESOPHAGOGASTRODUODENOSCOPY (EGD) WITH PROPOFOL N/A 05/22/2023   Procedure: ESOPHAGOGASTRODUODENOSCOPY (EGD) WITH PROPOFOL;  Surgeon: Midge Minium, MD;  Location: [facility];  Service: Endoscopy;  Laterality: N/A;   POLYPECTOMY  05/22/2023   Procedure: POLYPECTOMY;  Surgeon: Midge Minium, MD;  Location: [facility];  Service: Endoscopy;;    Family History  Problem Relation Age of Onset   Arthritis Mother    Hyperlipidemia Mother    Cancer Father        colon   Diabetes Brother    Breast cancer Paternal Aunt 52   Arthritis Maternal Grandmother    Hyperlipidemia Maternal Grandfather    Heart disease Neg Hx    Colon cancer Neg Hx     Social History:  reports that she has been smoking cigarettes. She started smoking about 21 years ago. She has a 5 pack-year smoking history. She has never used smokeless tobacco. She reports current alcohol use. She reports that she does not use drugs.  Allergies:  Allergies  Allergen Reactions   Compazine [Prochlorperazine] Swelling    Medications reviewed.    ROS Full ROS performed and is otherwise negative other than what is stated in HPI   BP 128/88   Pulse 90   Temp 98 F (36.7 C) (Oral)   Ht 5' 7.5" (1.715  m)   Wt 217 lb 9.6 oz (98.7 kg)   SpO2 97%   BMI 33.58 kg/m   Physical Exam   Physical Exam Vitals and nursing note reviewed. Exam conducted with a chaperone present.  Constitutional:      General: She is not in acute distress.    Appearance: Normal appearance.  Neck:     Vascular: No carotid bruit.  Cardiovascular:     Rate and Rhythm: Normal rate and regular rhythm.     Heart sounds: No murmur heard. Pulmonary:     Effort: Pulmonary effort is normal. No respiratory distress.     Breath sounds: Normal breath sounds. No stridor. No wheezing or rhonchi.  Abdominal:     General: Abdomen is flat. There is no distension.     Palpations: There is no mass.     Tenderness: There is no abdominal  tenderness. There is no guarding or rebound.     Hernia: No hernia is present.  Musculoskeletal:        General: No swelling or tenderness. Normal range of motion.     Cervical back: Normal range of motion and neck supple. No rigidity or tenderness.  Lymphadenopathy:     Cervical: No cervical adenopathy.  Skin:    General: Skin is warm.     Capillary Refill: Capillary refill takes less than 2 seconds.  Neurological:     General: No focal deficit present.     Mental Status: She is alert and oriented to person, place, and time.  Psychiatric:        Mood and Affect: Mood normal.        Behavior: Behavior normal.        Thought Content: Thought content normal.        Judgment: Judgment normal.     Assessment/Plan: 63 year old female with type III paraesophageal hernia.  I Discussed with the patient in detail about her disease process.  I do think that she will be a good candidate for robotic repair and Nissen fundoplication .we did talk about different modalities for therapy to include robotic repair of paraesophageal hernia.  I do think that her anatomy is amenable for this procedure.  I did talk to her about the risk the benefits and the possible complications.  She seems to be interested in moving forward , I did discuss in detail about diet modification post op.  Please note that I spent 40 minutes in this encounter including personal review of medical records, personally reviewing imaging studies, placing orders, counseling the patient and performing documentation  Sterling Big, MD [facility] General Surgeon

## 2023-10-13 ENCOUNTER — Other Ambulatory Visit: Payer: Self-pay

## 2023-10-13 ENCOUNTER — Encounter
Admission: RE | Admit: 2023-10-13 | Discharge: 2023-10-13 | Disposition: A | Discharge status: Home / Self Care | Source: Ambulatory Visit | Attending: Surgery | Admitting: Surgery

## 2023-10-13 VITALS — Ht 67.5 in | Wt 216.0 lb

## 2023-10-13 DIAGNOSIS — Z01810 Encounter for preprocedural cardiovascular examination: Secondary | ICD-10-CM

## 2023-10-13 DIAGNOSIS — I10 Essential (primary) hypertension: Secondary | ICD-10-CM

## 2023-10-13 HISTORY — DX: Unspecified chronic bronchitis: J42

## 2023-10-13 HISTORY — DX: Personal history of other diseases of the digestive system: Z87.19

## 2023-10-13 HISTORY — DX: Asymptomatic menopausal state: Z78.0

## 2023-10-13 NOTE — Patient Instructions (Addendum)
 Your procedure is scheduled [id number] April 8  Report to the Registration Desk on the 1st floor of the [facility] To find out your arrival time, please call [phone] between 1PM - 3PM on:  Monday April 7  If your arrival time is 6:00 am, do not arrive before that time as the [facility] entrance doors do not open until 6:00 am.  REMEMBER: Instructions that are not followed completely may result in serious medical risk, up to and including death; or upon the discretion of your surgeon and anesthesiologist your surgery may need to be rescheduled.  Do not eat food after midnight the night before surgery.  No gum chewing or hard candies.  You may however, drink CLEAR liquids up to 2 hours before you are scheduled to arrive for your surgery. Do not drink anything within 2 hours of your scheduled arrival time.  Clear liquids include: - water  - apple juice without pulp - gatorade (not RED colors) - black coffee or tea (Do NOT add milk or creamers to the coffee or tea) Do NOT drink anything that is not on this list.     One week prior to surgery:TUESDAY APRIL 1 Stop Anti-inflammatories (NSAIDS) such as Advil, Aleve, Ibuprofen, Motrin, Naproxen, Naprosyn and Aspirin based products such as Excedrin, Goody's Powder, BC Powder. Stop ANY OVER THE COUNTER supplements until after surgery. Vitamin D-Vitamin K  Multiple Vitamins-Minerals  You may however, continue to take Tylenol if needed for pain up until the day of surgery.  Continue taking all of your other prescription medications up until the day of surgery.  ON THE DAY OF SURGERY ONLY TAKE THESE MEDICATIONS WITH SIPS OF WATER:  buPROPion (WELLBUTRIN XL)  omeprazole (PRILOSEC)   Use inhalers on the day of surgery and bring to the hospital. albuterol (VENTOLIN HFA)   No Alcohol for 24 hours before or after surgery.  No Smoking including e-cigarettes for 24 hours before surgery.  No chewable tobacco products for at least 6  hours before surgery.  No nicotine patches on the day of surgery.  Do not use any "recreational" drugs for at least a week (preferably 2 weeks) before your surgery.  Please be advised that the combination of cocaine and anesthesia may have negative outcomes, up to and including death. If you test positive for cocaine, your surgery will be cancelled.  On the morning of surgery brush your teeth with toothpaste and water, you may rinse your mouth with mouthwash if you wish. Do not swallow any toothpaste or mouthwash.  Use CHG Soap as directed on instruction sheet.  Do not wear jewelry, make-up, hairpins, clips or nail polish.  For welded (permanent) jewelry: bracelets, anklets, waist bands, etc.  Please have this removed prior to surgery.  If it is not removed, there is a chance that hospital personnel will need to cut it off on the day of surgery.  Do not wear lotions, powders, or perfumes.   Do not shave body hair from the neck down 48 hours before surgery.  Contact lenses, hearing aids and dentures may not be worn into surgery.  Do not bring valuables to the hospital. [facility] is not responsible for any missing/lost belongings or valuables.   Notify your doctor if there is any change in your medical condition (cold, fever, infection).  Wear comfortable clothing (specific to your surgery type) to the hospital.  After surgery, you can help prevent lung complications by doing breathing exercises.  Take deep breaths and cough  every 1-2 hours.   When coughing or sneezing, hold a pillow firmly against your incision with both hands. This is called "splinting." Doing this helps protect your incision. It also decreases belly discomfort.   If you are being discharged the day of surgery, you will not be allowed to drive home. You will need a responsible individual to drive you home and stay with you for 24 hours after surgery.   If you are taking public transportation, you will need to  have a responsible individual with you.  Please call the Pre-admissions Testing Dept. at [phone] if you have any questions about these instructions.  Surgery Visitation Policy:  Patients having surgery or a procedure may have two visitors.  Children under the age of 74 must have an adult with them who is not the patient.       Preparing for Surgery with CHLORHEXIDINE GLUCONATE (CHG) Soap  Chlorhexidine Gluconate (CHG) Soap  o An antiseptic cleaner that kills germs and bonds with the skin to continue killing germs even after washing  o Used for showering the night before surgery and morning of surgery  Before surgery, you can play an important role by reducing the number of germs on your skin.  CHG (Chlorhexidine gluconate) soap is an antiseptic cleanser which kills germs and bonds with the skin to continue killing germs even after washing.  Please do not use if you have an allergy to CHG or antibacterial soaps. If your skin becomes reddened/irritated stop using the CHG.  1. Shower the NIGHT BEFORE SURGERY and the MORNING OF SURGERY with CHG soap.  2. If you choose to wash your hair, wash your hair first as usual with your normal shampoo.  3. After shampooing, rinse your hair and body thoroughly to remove the shampoo.  4. Use CHG as you would any other liquid soap. You can apply CHG directly to the skin and wash gently with a scrungie or a clean washcloth.  5. Apply the CHG soap to your body only from the neck down. Do not use on open wounds or open sores. Avoid contact with your eyes, ears, mouth, and genitals (private parts). Wash face and genitals (private parts) with your normal soap.  6. Wash thoroughly, paying special attention to the area where your surgery will be performed.  7. Thoroughly rinse your body with warm water.  8. Do not shower/wash with your normal soap after using and rinsing off the CHG soap.  9. Pat yourself dry with a clean towel.  10. Wear  clean pajamas to bed the night before surgery.  12. Place clean sheets on your bed the night of your first shower and do not sleep with pets.  13. Shower again with the CHG soap on the day of surgery prior to arriving at the hospital.  14. Do not apply any deodorants/lotions/powders.  15. Please wear clean clothes to the hospital.

## 2023-10-14 ENCOUNTER — Inpatient Hospital Stay: Admission: RE | Admit: 2023-10-14 | Source: Ambulatory Visit

## 2023-10-14 ENCOUNTER — Encounter
Admission: RE | Admit: 2023-10-14 | Discharge: 2023-10-14 | Disposition: A | Discharge status: Home / Self Care | Source: Ambulatory Visit | Attending: Surgery | Admitting: Surgery

## 2023-10-14 DIAGNOSIS — Z01818 Encounter for other preprocedural examination: Secondary | ICD-10-CM | POA: Diagnosis not present

## 2023-10-14 DIAGNOSIS — Z01810 Encounter for preprocedural cardiovascular examination: Secondary | ICD-10-CM | POA: Diagnosis not present

## 2023-10-14 DIAGNOSIS — I10 Essential (primary) hypertension: Secondary | ICD-10-CM | POA: Insufficient documentation

## 2023-10-21 ENCOUNTER — Observation Stay
Admission: RE | Admit: 2023-10-21 | Discharge: 2023-10-22 | Disposition: A | Discharge status: Home / Self Care | Source: Ambulatory Visit | Attending: Surgery | Admitting: Surgery

## 2023-10-21 ENCOUNTER — Other Ambulatory Visit: Payer: Self-pay

## 2023-10-21 ENCOUNTER — Ambulatory Visit: Admitting: General Practice

## 2023-10-21 ENCOUNTER — Encounter: Payer: Self-pay | Admitting: Surgery

## 2023-10-21 ENCOUNTER — Encounter: Admission: RE | Payer: Self-pay | Source: Ambulatory Visit

## 2023-10-21 DIAGNOSIS — F17210 Nicotine dependence, cigarettes, uncomplicated: Secondary | ICD-10-CM | POA: Diagnosis not present

## 2023-10-21 DIAGNOSIS — F1090 Alcohol use, unspecified, uncomplicated: Secondary | ICD-10-CM | POA: Insufficient documentation

## 2023-10-21 DIAGNOSIS — K219 Gastro-esophageal reflux disease without esophagitis: Secondary | ICD-10-CM | POA: Diagnosis not present

## 2023-10-21 DIAGNOSIS — D360 Benign neoplasm of lymph nodes: Secondary | ICD-10-CM | POA: Insufficient documentation

## 2023-10-21 DIAGNOSIS — K469 Unspecified abdominal hernia without obstruction or gangrene: Secondary | ICD-10-CM | POA: Insufficient documentation

## 2023-10-21 DIAGNOSIS — K449 Diaphragmatic hernia without obstruction or gangrene: Secondary | ICD-10-CM | POA: Diagnosis not present

## 2023-10-21 DIAGNOSIS — Z48815 Encounter for surgical aftercare following surgery on the digestive system: Secondary | ICD-10-CM | POA: Diagnosis not present

## 2023-10-21 DIAGNOSIS — Z79899 Other long term (current) drug therapy: Secondary | ICD-10-CM | POA: Diagnosis not present

## 2023-10-21 DIAGNOSIS — K4090 Unilateral inguinal hernia, without obstruction or gangrene, not specified as recurrent: Secondary | ICD-10-CM | POA: Insufficient documentation

## 2023-10-21 DIAGNOSIS — Z8719 Personal history of other diseases of the digestive system: Principal | ICD-10-CM

## 2023-10-21 DIAGNOSIS — R12 Heartburn: Secondary | ICD-10-CM | POA: Diagnosis not present

## 2023-10-21 HISTORY — PX: XI ROBOTIC ASSISTED PARAESOPHAGEAL HERNIA REPAIR: SHX6871

## 2023-10-21 HISTORY — PX: INSERTION OF MESH: SHX5868

## 2023-10-21 SURGERY — REPAIR, HERNIA, PARAESOPHAGEAL, ROBOT-ASSISTED
Anesthesia: General

## 2023-10-21 MED ORDER — ROCURONIUM BROMIDE 100 MG/10ML IV SOLN
INTRAVENOUS | Status: DC | PRN
  Administered 2023-10-21: 50 mg via INTRAVENOUS
  Administered 2023-10-21: 10 mg via INTRAVENOUS
  Administered 2023-10-21: 20 mg via INTRAVENOUS

## 2023-10-21 MED ORDER — ONDANSETRON HCL 4 MG/2ML IJ SOLN
INTRAMUSCULAR | Status: DC | PRN
  Administered 2023-10-21 (×2): 4 mg via INTRAVENOUS

## 2023-10-21 MED ORDER — ACETAMINOPHEN 500 MG PO TABS
ORAL_TABLET | ORAL | Status: AC
  Filled 2023-10-21: qty 2

## 2023-10-21 MED ORDER — BUPROPION HCL ER (XL) 150 MG PO TB24
150.0000 mg | ORAL_TABLET | Freq: Every day | ORAL | Status: DC
Start: 2023-10-22 — End: 2023-10-22

## 2023-10-21 MED ORDER — CHLORHEXIDINE GLUCONATE CLOTH 2 % EX PADS: 6.0000 | MEDICATED_PAD | Freq: Once | CUTANEOUS | Status: DC

## 2023-10-21 MED ORDER — ACETAMINOPHEN 500 MG PO TABS
1000.0000 mg | ORAL_TABLET | ORAL | Status: AC
  Administered 2023-10-21: 1000 mg via ORAL

## 2023-10-21 MED ORDER — ENOXAPARIN SODIUM 40 MG/0.4ML IJ SOSY
40.0000 mg | PREFILLED_SYRINGE | INTRAMUSCULAR | Status: DC
  Administered 2023-10-22: 40 mg via SUBCUTANEOUS
  Filled 2023-10-21: qty 0.4

## 2023-10-21 MED ORDER — BUPIVACAINE-EPINEPHRINE 0.25% -1:200000 IJ SOLN
INTRAMUSCULAR | Status: DC | PRN
  Administered 2023-10-21: 30 mL

## 2023-10-21 MED ORDER — KETOROLAC TROMETHAMINE 30 MG/ML IJ SOLN
INTRAMUSCULAR | Status: AC
  Filled 2023-10-21: qty 1

## 2023-10-21 MED ORDER — ALBUTEROL SULFATE HFA 108 (90 BASE) MCG/ACT IN AERS: 1.0000 | INHALATION_SPRAY | Freq: Four times a day (QID) | RESPIRATORY_TRACT | Status: DC | PRN

## 2023-10-21 MED ORDER — VISTASEAL 10 ML SINGLE DOSE KIT
PACK | CUTANEOUS | Status: AC
  Filled 2023-10-21: qty 10

## 2023-10-21 MED ORDER — GABAPENTIN 300 MG PO CAPS
ORAL_CAPSULE | ORAL | Status: AC
  Filled 2023-10-21: qty 1

## 2023-10-21 MED ORDER — GLYCOPYRROLATE 0.2 MG/ML IJ SOLN
INTRAMUSCULAR | Status: DC | PRN
  Administered 2023-10-21: .2 mg via INTRAVENOUS

## 2023-10-21 MED ORDER — SUCCINYLCHOLINE CHLORIDE 200 MG/10ML IV SOSY
PREFILLED_SYRINGE | INTRAVENOUS | Status: DC | PRN
  Administered 2023-10-21: 100 mg via INTRAVENOUS

## 2023-10-21 MED ORDER — EPHEDRINE SULFATE-NACL 50-0.9 MG/10ML-% IV SOSY
PREFILLED_SYRINGE | INTRAVENOUS | Status: DC | PRN
  Administered 2023-10-21 (×2): 5 mg via INTRAVENOUS

## 2023-10-21 MED ORDER — LORATADINE 10 MG PO TABS
10.0000 mg | ORAL_TABLET | Freq: Every day | ORAL | Status: DC
  Administered 2023-10-21: 10 mg via ORAL

## 2023-10-21 MED ORDER — OXYCODONE HCL 5 MG/5ML PO SOLN: 5.0000 mg | Freq: Once | ORAL | Status: AC | PRN

## 2023-10-21 MED ORDER — VISTASEAL 10 ML SINGLE DOSE KIT
PACK | CUTANEOUS | Status: DC | PRN
Start: 2023-10-21 — End: 2023-10-21
  Administered 2023-10-21: 10 mL via TOPICAL

## 2023-10-21 MED ORDER — MELATONIN 5 MG PO TABS: 5.0000 mg | ORAL_TABLET | Freq: Every evening | ORAL | Status: DC | PRN

## 2023-10-21 MED ORDER — CEFAZOLIN SODIUM-DEXTROSE 2-4 GM/100ML-% IV SOLN
2.0000 g | INTRAVENOUS | Status: AC
  Administered 2023-10-21: 2 g via INTRAVENOUS

## 2023-10-21 MED ORDER — LACTATED RINGERS IV SOLN: INTRAVENOUS | Status: DC

## 2023-10-21 MED ORDER — DIPHENHYDRAMINE HCL 50 MG/ML IJ SOLN: 12.5000 mg | Freq: Four times a day (QID) | INTRAMUSCULAR | Status: DC | PRN

## 2023-10-21 MED ORDER — DIPHENHYDRAMINE HCL 12.5 MG/5ML PO ELIX: 12.5000 mg | ORAL_SOLUTION | Freq: Four times a day (QID) | ORAL | Status: DC | PRN

## 2023-10-21 MED ORDER — CHLORHEXIDINE GLUCONATE 0.12 % MT SOLN
OROMUCOSAL | Status: AC
  Filled 2023-10-21: qty 15

## 2023-10-21 MED ORDER — CEFAZOLIN SODIUM-DEXTROSE 2-4 GM/100ML-% IV SOLN
INTRAVENOUS | Status: AC
  Filled 2023-10-21: qty 100

## 2023-10-21 MED ORDER — LORATADINE 10 MG PO TABS
ORAL_TABLET | ORAL | Status: AC
  Filled 2023-10-21: qty 1

## 2023-10-21 MED ORDER — MIDAZOLAM HCL 2 MG/2ML IJ SOLN
INTRAMUSCULAR | Status: DC | PRN
  Administered 2023-10-21: 2 mg via INTRAVENOUS

## 2023-10-21 MED ORDER — PHENYLEPHRINE HCL-NACL 20-0.9 MG/250ML-% IV SOLN
INTRAVENOUS | Status: DC | PRN
  Administered 2023-10-21: 25 ug/min via INTRAVENOUS

## 2023-10-21 MED ORDER — CELECOXIB 200 MG PO CAPS
200.0000 mg | ORAL_CAPSULE | ORAL | Status: AC
  Administered 2023-10-21: 200 mg via ORAL

## 2023-10-21 MED ORDER — ALBUTEROL SULFATE (2.5 MG/3ML) 0.083% IN NEBU: 2.5000 mg | INHALATION_SOLUTION | Freq: Four times a day (QID) | RESPIRATORY_TRACT | Status: DC | PRN

## 2023-10-21 MED ORDER — METHOCARBAMOL 500 MG PO TABS
ORAL_TABLET | ORAL | Status: AC
  Filled 2023-10-21: qty 1

## 2023-10-21 MED ORDER — DEXAMETHASONE SODIUM PHOSPHATE 10 MG/ML IJ SOLN
INTRAMUSCULAR | Status: DC | PRN
  Administered 2023-10-21: 20 mg via INTRAVENOUS

## 2023-10-21 MED ORDER — OXYCODONE HCL 5 MG PO TABS
5.0000 mg | ORAL_TABLET | Freq: Once | ORAL | Status: AC | PRN
  Administered 2023-10-21: 5 mg via ORAL

## 2023-10-21 MED ORDER — PHENYLEPHRINE 80 MCG/ML (10ML) SYRINGE FOR IV PUSH (FOR BLOOD PRESSURE SUPPORT)
PREFILLED_SYRINGE | INTRAVENOUS | Status: DC | PRN
  Administered 2023-10-21: 160 ug via INTRAVENOUS
  Administered 2023-10-21: 80 ug via INTRAVENOUS
  Administered 2023-10-21 (×3): 160 ug via INTRAVENOUS
  Administered 2023-10-21: 80 ug via INTRAVENOUS
  Administered 2023-10-21: 160 ug via INTRAVENOUS

## 2023-10-21 MED ORDER — CHLORHEXIDINE GLUCONATE 0.12 % MT SOLN
15.0000 mL | Freq: Once | OROMUCOSAL | Status: AC
  Administered 2023-10-21: 15 mL via OROMUCOSAL

## 2023-10-21 MED ORDER — SUGAMMADEX SODIUM 200 MG/2ML IV SOLN
INTRAVENOUS | Status: DC | PRN
  Administered 2023-10-21: 200 mg via INTRAVENOUS

## 2023-10-21 MED ORDER — MIDAZOLAM HCL 2 MG/2ML IJ SOLN
INTRAMUSCULAR | Status: AC
Start: 2023-10-21 — End: ?
  Filled 2023-10-21: qty 2

## 2023-10-21 MED ORDER — KETOROLAC TROMETHAMINE 30 MG/ML IJ SOLN
30.0000 mg | Freq: Four times a day (QID) | INTRAMUSCULAR | Status: DC
  Administered 2023-10-21 – 2023-10-22 (×4): 30 mg via INTRAVENOUS

## 2023-10-21 MED ORDER — LIDOCAINE HCL (CARDIAC) PF 100 MG/5ML IV SOSY
PREFILLED_SYRINGE | INTRAVENOUS | Status: DC | PRN
  Administered 2023-10-21: 100 mg via INTRAVENOUS

## 2023-10-21 MED ORDER — METHOCARBAMOL 1000 MG/10ML IJ SOLN: 500.0000 mg | Freq: Three times a day (TID) | INTRAMUSCULAR | Status: DC | PRN

## 2023-10-21 MED ORDER — BUPIVACAINE LIPOSOME 1.3 % IJ SUSP
INTRAMUSCULAR | Status: DC | PRN
  Administered 2023-10-21: 20 mL

## 2023-10-21 MED ORDER — ORAL CARE MOUTH RINSE: 15.0000 mL | Freq: Once | OROMUCOSAL | Status: AC

## 2023-10-21 MED ORDER — FENTANYL CITRATE (PF) 100 MCG/2ML IJ SOLN
INTRAMUSCULAR | Status: DC | PRN
  Administered 2023-10-21 (×2): 50 ug via INTRAVENOUS

## 2023-10-21 MED ORDER — MORPHINE SULFATE (PF) 4 MG/ML IV SOLN: 4.0000 mg | INTRAVENOUS | Status: DC | PRN

## 2023-10-21 MED ORDER — HYDRALAZINE HCL 20 MG/ML IJ SOLN: 10.0000 mg | INTRAMUSCULAR | Status: DC | PRN

## 2023-10-21 MED ORDER — SODIUM CHLORIDE 0.9 % IV SOLN: INTRAVENOUS | Status: AC

## 2023-10-21 MED ORDER — ACETAMINOPHEN 500 MG PO TABS
1000.0000 mg | ORAL_TABLET | Freq: Four times a day (QID) | ORAL | Status: DC
  Administered 2023-10-21 – 2023-10-22 (×4): 1000 mg via ORAL
  Filled 2023-10-21 (×3): qty 2

## 2023-10-21 MED ORDER — ONDANSETRON HCL 4 MG/2ML IJ SOLN
4.0000 mg | Freq: Four times a day (QID) | INTRAMUSCULAR | Status: DC | PRN
  Administered 2023-10-21 (×3): 4 mg via INTRAVENOUS
  Filled 2023-10-21 (×2): qty 2

## 2023-10-21 MED ORDER — GABAPENTIN 300 MG PO CAPS
300.0000 mg | ORAL_CAPSULE | ORAL | Status: AC
Start: 2023-10-21 — End: 2023-10-21
  Administered 2023-10-21: 300 mg via ORAL

## 2023-10-21 MED ORDER — OXYCODONE HCL 5 MG PO TABS
ORAL_TABLET | ORAL | Status: AC
Start: 2023-10-21 — End: ?
  Filled 2023-10-21: qty 1

## 2023-10-21 MED ORDER — DEXMEDETOMIDINE HCL IN NACL 200 MCG/50ML IV SOLN
INTRAVENOUS | Status: DC | PRN
  Administered 2023-10-21: 8 ug via INTRAVENOUS

## 2023-10-21 MED ORDER — ALBUTEROL SULFATE HFA 108 (90 BASE) MCG/ACT IN AERS
INHALATION_SPRAY | RESPIRATORY_TRACT | Status: DC | PRN
  Administered 2023-10-21: 4 via RESPIRATORY_TRACT

## 2023-10-21 MED ORDER — FLUTICASONE PROPIONATE 50 MCG/ACT NA SUSP
2.0000 | Freq: Every day | NASAL | Status: DC
  Filled 2023-10-21: qty 16

## 2023-10-21 MED ORDER — CELECOXIB 200 MG PO CAPS
ORAL_CAPSULE | ORAL | Status: AC
  Filled 2023-10-21: qty 1

## 2023-10-21 MED ORDER — ESMOLOL HCL 100 MG/10ML IV SOLN
INTRAVENOUS | Status: DC | PRN
Start: 2023-10-21 — End: 2023-10-21
  Administered 2023-10-21: 20 mg via INTRAVENOUS

## 2023-10-21 MED ORDER — CHLORHEXIDINE GLUCONATE CLOTH 2 % EX PADS
6.0000 | MEDICATED_PAD | Freq: Once | CUTANEOUS | Status: AC
  Administered 2023-10-21: 6 via TOPICAL

## 2023-10-21 MED ORDER — BUPIVACAINE LIPOSOME 1.3 % IJ SUSP
INTRAMUSCULAR | Status: AC
  Filled 2023-10-21: qty 20

## 2023-10-21 MED ORDER — FENTANYL CITRATE (PF) 100 MCG/2ML IJ SOLN
INTRAMUSCULAR | Status: AC
  Filled 2023-10-21: qty 2

## 2023-10-21 MED ORDER — ONDANSETRON HCL 4 MG/2ML IJ SOLN
INTRAMUSCULAR | Status: AC
  Filled 2023-10-21: qty 2

## 2023-10-21 MED ORDER — ONDANSETRON 4 MG PO TBDP
4.0000 mg | ORAL_TABLET | Freq: Four times a day (QID) | ORAL | Status: DC | PRN
Start: 2023-10-21 — End: 2023-10-22

## 2023-10-21 MED ORDER — CEFAZOLIN SODIUM-DEXTROSE 2-4 GM/100ML-% IV SOLN
2.0000 g | Freq: Three times a day (TID) | INTRAVENOUS | Status: AC
  Administered 2023-10-21 – 2023-10-22 (×3): 2 g via INTRAVENOUS
  Filled 2023-10-21 (×2): qty 100

## 2023-10-21 MED ORDER — PROPOFOL 1000 MG/100ML IV EMUL
INTRAVENOUS | Status: AC
  Filled 2023-10-21: qty 100

## 2023-10-21 MED ORDER — PROPOFOL 10 MG/ML IV BOLUS
INTRAVENOUS | Status: DC | PRN
  Administered 2023-10-21: 150 mg via INTRAVENOUS

## 2023-10-21 MED ORDER — HYDROMORPHONE HCL 1 MG/ML IJ SOLN
INTRAMUSCULAR | Status: DC | PRN
Start: 2023-10-21 — End: 2023-10-21
  Administered 2023-10-21: 1 mg via INTRAVENOUS

## 2023-10-21 MED ORDER — FENTANYL CITRATE (PF) 100 MCG/2ML IJ SOLN
25.0000 ug | INTRAMUSCULAR | Status: DC | PRN
  Administered 2023-10-21: 25 ug via INTRAVENOUS

## 2023-10-21 MED ORDER — BUPIVACAINE-EPINEPHRINE (PF) 0.25% -1:200000 IJ SOLN
INTRAMUSCULAR | Status: AC
  Filled 2023-10-21: qty 30

## 2023-10-21 MED ORDER — OXYCODONE HCL 5 MG PO TABS: 5.0000 mg | ORAL_TABLET | ORAL | Status: DC | PRN

## 2023-10-21 MED ORDER — METHOCARBAMOL 500 MG PO TABS
500.0000 mg | ORAL_TABLET | Freq: Three times a day (TID) | ORAL | Status: DC | PRN
  Administered 2023-10-21: 500 mg via ORAL

## 2023-10-21 MED ORDER — HYDROMORPHONE HCL 1 MG/ML IJ SOLN
INTRAMUSCULAR | Status: AC
  Filled 2023-10-21: qty 1

## 2023-10-21 SURGICAL SUPPLY — 51 items
APPLICATOR VISTASEAL 35 (MISCELLANEOUS) ×1 IMPLANT
CANNULA REDUCER 12-8 DVNC XI (CANNULA) ×2 IMPLANT
CLIP LIGATING HEM O LOK PURPLE (MISCELLANEOUS) ×1 IMPLANT
DERMABOND ADVANCED .7 DNX12 (GAUZE/BANDAGES/DRESSINGS) ×2 IMPLANT
DRAPE ARM DVNC X/XI (DISPOSABLE) ×8 IMPLANT
DRAPE COLUMN DVNC XI (DISPOSABLE) ×2 IMPLANT
ELECT REM PT RETURN 9FT ADLT (ELECTROSURGICAL) ×2 IMPLANT
ELECTRODE REM PT RTRN 9FT ADLT (ELECTROSURGICAL) ×2 IMPLANT
FORCEPS BPLR R/ABLATION 8 DVNC (INSTRUMENTS) ×2 IMPLANT
GLOVE BIO SURGEON STRL SZ7 (GLOVE) ×14 IMPLANT
GOWN STRL REUS W/ TWL LRG LVL3 (GOWN DISPOSABLE) ×11 IMPLANT
GRASPER LAPSCPC 5X45 DSP (INSTRUMENTS) ×2 IMPLANT
GRASPER TIP-UP FEN DVNC XI (INSTRUMENTS) ×2 IMPLANT
IRRIGATION STRYKERFLOW (MISCELLANEOUS) ×1 IMPLANT
IRRIGATOR STRYKERFLOW (MISCELLANEOUS) ×2 IMPLANT
IV NS 1000ML BAXH (IV SOLUTION) ×1 IMPLANT
KIT IMAGING PINPOINTPAQ (MISCELLANEOUS) ×2 IMPLANT
KIT PINK PAD W/HEAD ARE REST (MISCELLANEOUS) ×2 IMPLANT
KIT PINK PAD W/HEAD ARM REST (MISCELLANEOUS) ×1 IMPLANT
LABEL OR SOLS (LABEL) ×2 IMPLANT
MANIFOLD NEPTUNE II (INSTRUMENTS) ×2 IMPLANT
MESH BIO-A 7X10 SYN MAT (Mesh General) ×1 IMPLANT
NDL DRIVE SUT CUT DVNC (INSTRUMENTS) ×1 IMPLANT
NDL HYPO 22X1.5 SAFETY MO (MISCELLANEOUS) ×1 IMPLANT
NEEDLE DRIVE SUT CUT DVNC (INSTRUMENTS) ×2 IMPLANT
NEEDLE HYPO 22X1.5 SAFETY MO (MISCELLANEOUS) ×2 IMPLANT
OBTURATOR OPTICAL STND 8 DVNC (TROCAR) ×2 IMPLANT
OBTURATOR OPTICALSTD 8 DVNC (TROCAR) ×2 IMPLANT
PACK LAP CHOLECYSTECTOMY (MISCELLANEOUS) ×2 IMPLANT
SEAL UNIV 5-12 XI (MISCELLANEOUS) ×8 IMPLANT
SEALER VESSEL EXT DVNC XI (MISCELLANEOUS) ×2 IMPLANT
SOL ELECTROSURG ANTI STICK (MISCELLANEOUS) ×2 IMPLANT
SOLUTION ELECTROSURG ANTI STCK (MISCELLANEOUS) ×2 IMPLANT
SPIKE FLUID TRANSFER (MISCELLANEOUS) ×2 IMPLANT
SPONGE T-LAP 18X18 ~~LOC~~+RFID (SPONGE) ×2 IMPLANT
SUT MNCRL 4-0 27 PS-2 XMFL (SUTURE) ×2 IMPLANT
SUT SILK 2 0 SH (SUTURE) ×4 IMPLANT
SUT STRATA 2-0 23CM CT-2 (SUTURE) ×2 IMPLANT
SUT VIC AB 3-0 SH 27X BRD (SUTURE) IMPLANT
SUT VICRYL 0 UR6 27IN ABS (SUTURE) ×4 IMPLANT
SUTURE MNCRL 4-0 27XMF (SUTURE) ×2 IMPLANT
SYR 30ML LL (SYRINGE) ×2 IMPLANT
SYR TOOMEY IRRIG 70ML (MISCELLANEOUS) ×2 IMPLANT
SYRINGE TOOMEY IRRIG 70ML (MISCELLANEOUS) ×2 IMPLANT
SYS BAG RETRIEVAL 10MM (BASKET) ×2 IMPLANT
SYSTEM BAG RETRIEVAL 10MM (BASKET) ×1 IMPLANT
TRAP FLUID SMOKE EVACUATOR (MISCELLANEOUS) ×2 IMPLANT
TRAY FOLEY SLVR 16FR LF STAT (SET/KITS/TRAYS/PACK) ×2 IMPLANT
TROCAR Z-THREAD FIOS 5X100MM (TROCAR) ×2 IMPLANT
TUBING EVAC SMOKE HEATED PNEUM (TUBING) ×2 IMPLANT
WATER STERILE IRR 500ML POUR (IV SOLUTION) ×1 IMPLANT

## 2023-10-21 NOTE — Transfer of Care (Signed)
 Immediate Anesthesia Transfer of Care Note  Patient: Colleen Washington  Procedure(s) Performed: REPAIR, HERNIA, PARAESOPHAGEAL, ROBOT-ASSISTED INSERTION OF MESH  Patient Location: PACU  Anesthesia Type:General  Level of Consciousness: awake, drowsy, and patient cooperative  Airway & Oxygen Therapy: Patient Spontanous Breathing and Patient connected to face mask oxygen  Post-op Assessment: Report given to RN and Post -op Vital signs reviewed and stable  Post vital signs: Reviewed and stable  Last Vitals:  Vitals Value Taken Time  BP    Temp    Pulse    Resp    SpO2      Last Pain:  Vitals:   10/21/23 0635  TempSrc:   PainSc: 0-No pain         Complications: No notable events documented.

## 2023-10-21 NOTE — Anesthesia Procedure Notes (Signed)
 Procedure Name: Intubation Date/Time: 10/21/2023 7:38 AM  Performed by: Mohammed Kindle, CRNAPre-anesthesia Checklist: Patient identified, Emergency Drugs available, Suction available and Patient being monitored Patient Re-evaluated:Patient Re-evaluated prior to induction Oxygen Delivery Method: Circle system utilized Preoxygenation: Pre-oxygenation with 100% oxygen Induction Type: IV induction Ventilation: Mask ventilation without difficulty Laryngoscope Size: McGrath and 3 Grade View: Grade I Tube type: Oral Tube size: 6.5 mm Number of attempts: 1 Airway Equipment and Method: Stylet and Oral airway Placement Confirmation: ETT inserted through vocal cords under direct vision, positive ETCO2 and breath sounds checked- equal and bilateral Secured at: 21 cm Tube secured with: Tape Dental Injury: Teeth and Oropharynx as per pre-operative assessment

## 2023-10-21 NOTE — Discharge Instructions (Signed)
 In addition to included general post-operative instructions,  Diet: Recommend following Nissen diet restrictions x4 weeks minimum; hand out given  Activity: No heavy lifting >20 pounds (children, pets, laundry, garbage) or strenuous activity for 4 weeks, but light activity and walking are encouraged. Do not drive or drink alcohol if taking narcotic pain medications or having pain that might distract from driving.  Wound care: You may shower/get incision wet with soapy water and pat dry (do not rub incisions), but no baths or submerging incision underwater until follow-up.   Medications: Resume all home medications. For mild to moderate pain: acetaminophen (Tylenol) or ibuprofen/naproxen (if no kidney disease). Combining Tylenol with alcohol can substantially increase your risk of causing liver disease. Narcotic pain medications, if prescribed, can be used for severe pain, though may cause nausea, constipation, and drowsiness. Do not combine Tylenol and Percocet (or similar) within a 6 hour period as Percocet (and similar) contain(s) Tylenol. If you do not need the narcotic pain medication, you do not need to fill the prescription.  Call office [phone] / [phone]) at any time if any questions, worsening pain, fevers/chills, bleeding, drainage from incision site, or other concerns.

## 2023-10-21 NOTE — Op Note (Signed)
 Robotic assisted laparoscopic repair of  paraesophageal  hernia with Bio-A Mesh and Nissen fundoplication  Pre-operative Diagnosis: GERD, paraesophageal hernia  Post-operative Diagnosis: same  Procedure:  Robotic assisted laparoscopic repair of  paraesophageal  hernia with Bio-A Mesh and Nissen fundoplication  Surgeon: Sterling Big, MD FACS  Assistant: Gaston Islam, RNFA, Required due to the complexity of the case the need for exposure and lack of first assist.  Anesthesia: Gen. 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 The benefits, complications, treatment options, and expected outcomes were discussed with the patient. The risks of bleeding, infection, recurrence of symptoms, failure to resolve symptoms,  esophageal damage, Dysphagia, bowel injury, any of which could require further surgery were reviewed with the patient. The likelihood of improving the patient's symptoms with return to their baseline status is good.  The patient and/or family concurred with the proposed plan, giving informed consent.  The patient was taken to Operating Room, identified  and the procedure verified.  A Time Out was held and the above information confirmed.  Prior to the induction of general anesthesia, antibiotic prophylaxis was administered. VTE prophylaxis was in place. General endotracheal anesthesia was then administered and tolerated well. After the induction, the abdomen was prepped with Chloraprep and draped in the sterile fashion. The patient was positioned in the supine position.  Cut down technique was used to enter the abdominal cavity and a Hasson trochar was placed after two vicryl stitches were anchored to the fascia.  Pneumoperitoneum was then created with CO2 and tolerated well without any adverse changes in the patient's vital signs.  Three 8-mm ports were placed under direct vision. All skin incisions  were infiltrated with a local anesthetic agent before making the incision and placing the trocars. An additional 5 mm regular laparoscopic port was placed to assist with retraction and exposure.   The patient was positioned  in reverse Trendelenburg, robot was brought to the surgical field and docked in the standard fashion.  We made sure all the instrumentation was kept indirect view at all times and that there were no collision between the arms. I scrubbed out and went to the console.  I used a robotic arm to retract the liver, the vessel sealer on my right hand and a forced bipolar grasper on my left hand.  There is along the extra 5 mm port allow me ample exposure and the ability to perform meticulous dissection  We Started dividing the lesser omentum via the pars flaccida.  We Were able to dissect the lesser curvature of the stomach and  dissected the fundus free from the right and left crus.  We circumferentially dissected the GE junction.  The hernia sac was also completely reduced and we were able to bring the stomach into the intra-abdominal position.  Attention then was turned to the greater curvature where the short gastrics were divided with sealer device.  We were able to identify the left crus and again were able to make sure there was a good circumferential dissection and that the hernia sac was completely excised.  We did perform a good dissection within the mediastinum to allow a complete reduction of the sac,  gain esophageal length and a to completely allow an intra-abdominal fundoplication.  Using two strips of Bio-A as pledgets we approximated the crus with a 2-0V Stratafix suture. A bio-A 10x7 cm mesh was inserted and secured using Vistaseal.   We Asked anesthesia to initially placed ICG via the  OG, no evidence of esophageal or gastric perforation were seen, Anesthesia also placed sequential Bougies from 30 to a 50 French bougie and they went easily.  We also observe trajectory of the bougies. 360 degree Nissen fundoplication was created with multiple 2-0 silk sutures and we placed 3 stitches taking some of the esophagus within that bite.  The fundoplication measured approximately 3-1/2 cm and he was floppy. I was very happy with the way the fundoplication laid and the repair of the hernia.   Inspection of the  upper quadrant was performed. No bleeding, bile  Or esophageal injuries leaks, or bowel injuries were noted. Robotic instruments and robotic arms were undocked in the standard fashion. All the needles were removed under direct visualization.   I scrubbed back in.  Pneumoperitoneum was released.  The periumbilical port site was closed with interrumpted 0 Vicryl sutures. 4-0 subcuticular Monocryl was used to close the skin. Liposomal marcaine was injected to all the incisions sites.  Dermabond was  applied.  The patient was then extubated and brought to the recovery room in stable condition. Sponge, lap, and needle counts were correct at closure and at the conclusion of the case.               Sterling Big, MD, FACS

## 2023-10-21 NOTE — Interval H&P Note (Signed)
 History and Physical Interval Note:  10/21/2023 7:22 AM  Colleen Washington  has presented today for surgery, with the diagnosis of Paraesophageal hernia.  The various methods of treatment have been discussed with the patient and family. After consideration of risks, benefits and other options for treatment, the patient has consented to  Procedure(s): REPAIR, HERNIA, PARAESOPHAGEAL, ROBOT-ASSISTED (N/A) INSERTION OF MESH as a surgical intervention.  The patient's history has been reviewed, patient examined, no change in status, stable for surgery.  I have reviewed the patient's chart and labs.  Questions were answered to the patient's satisfaction.     Haskell Rihn F Leul Narramore

## 2023-10-21 NOTE — Anesthesia Preprocedure Evaluation (Signed)
 Anesthesia Evaluation  Patient identified by MRN, date of birth, ID band Patient awake    Reviewed: Allergy & Precautions, NPO status , Patient's Chart, lab work & pertinent test results  History of Anesthesia Complications Negative for: history of anesthetic complications  Airway Mallampati: II  TM Distance: >3 FB Neck ROM: Full    Dental no notable dental hx. (+) Teeth Intact   Pulmonary asthma , neg sleep apnea, neg COPD, Current Smoker and Patient abstained from smoking.   Pulmonary exam normal breath sounds clear to auscultation       Cardiovascular Exercise Tolerance: Good hypertension, Pt. on medications + CAD  (-) Past MI (-) dysrhythmias  Rhythm:Regular Rate:Normal - Systolic murmurs    Neuro/Psych  Headaches PSYCHIATRIC DISORDERS Anxiety Depression       GI/Hepatic ,GERD  ,,(+)     (-) substance abuse    Endo/Other  neg diabetes    Renal/GU      Musculoskeletal   Abdominal   Peds  Hematology   Anesthesia Other Findings Past Medical History: No date: allergic rhinitis No date: Anxiety No date: AR (allergic rhinitis) No date: Asthma No date: Depression No date: Endometriosis No date: GERD (gastroesophageal reflux disease) No date: H/O migraine No date: Herniated intervertebral disc No date: History of diverticulitis of colon No date: Hyperlipidemia No date: Hypertension No date: IBS (irritable bowel syndrome) No date: Irritable bowel syndrome No date: Nonobstructive CAD (coronary artery disease)     Comment:  a. 11/2019 Cor CTA: Cor Ca2+ = 141 (95th %'ile). LAD               25-49p. No date: Psoriasis No date: Tobacco abuse  Reproductive/Obstetrics                             Anesthesia Physical Anesthesia Plan  ASA: 2  Anesthesia Plan: General ETT   Post-op Pain Management:    Induction: Intravenous  PONV Risk Score and Plan:   Airway Management Planned:  Oral ETT  Additional Equipment:   Intra-op Plan:   Post-operative Plan: Extubation in OR  Informed Consent: I have reviewed the patients History and Physical, chart, labs and discussed the procedure including the risks, benefits and alternatives for the proposed anesthesia with the patient or authorized representative who has indicated his/her understanding and acceptance.     Dental Advisory Given  Plan Discussed with: Anesthesiologist, CRNA and Surgeon  Anesthesia Plan Comments: (Patient consented for risks of anesthesia including but not limited to:  - adverse reactions to medications - damage to eyes, teeth, lips or other oral mucosa - nerve damage due to positioning  - sore throat or hoarseness - Damage to heart, brain, nerves, lungs, other parts of body or loss of life  Patient voiced understanding and assent.)       Anesthesia Quick Evaluation

## 2023-10-21 NOTE — Anesthesia Postprocedure Evaluation (Signed)
 Anesthesia Post Note  Patient: Colleen Washington  Procedure(s) Performed: REPAIR, HERNIA, PARAESOPHAGEAL, ROBOT-ASSISTED INSERTION OF MESH  Patient location during evaluation: PACU Anesthesia Type: General Level of consciousness: awake and alert Pain management: pain level controlled Vital Signs Assessment: post-procedure vital signs reviewed and stable Respiratory status: spontaneous breathing, nonlabored ventilation, respiratory function stable and patient connected to nasal cannula oxygen Cardiovascular status: blood pressure returned to baseline and stable Postop Assessment: no apparent nausea or vomiting Anesthetic complications: no  No notable events documented.   Last Vitals:  Vitals:   10/21/23 1145 10/21/23 1200  BP:  117/78  Pulse: 95 97  Resp: 14 13  Temp:    SpO2: 100% 97%    Last Pain:  Vitals:   10/21/23 1200  TempSrc:   PainSc: 4                  Stephanie Coup

## 2023-10-22 ENCOUNTER — Encounter: Payer: Self-pay | Admitting: Surgery

## 2023-10-22 ENCOUNTER — Telehealth: Payer: Self-pay | Admitting: *Deleted

## 2023-10-22 DIAGNOSIS — K469 Unspecified abdominal hernia without obstruction or gangrene: Secondary | ICD-10-CM | POA: Diagnosis not present

## 2023-10-22 DIAGNOSIS — F1090 Alcohol use, unspecified, uncomplicated: Secondary | ICD-10-CM | POA: Diagnosis not present

## 2023-10-22 DIAGNOSIS — K219 Gastro-esophageal reflux disease without esophagitis: Secondary | ICD-10-CM | POA: Diagnosis not present

## 2023-10-22 DIAGNOSIS — Z79899 Other long term (current) drug therapy: Secondary | ICD-10-CM | POA: Diagnosis not present

## 2023-10-22 DIAGNOSIS — R12 Heartburn: Secondary | ICD-10-CM | POA: Diagnosis not present

## 2023-10-22 DIAGNOSIS — D360 Benign neoplasm of lymph nodes: Secondary | ICD-10-CM | POA: Diagnosis not present

## 2023-10-22 DIAGNOSIS — F17210 Nicotine dependence, cigarettes, uncomplicated: Secondary | ICD-10-CM | POA: Diagnosis not present

## 2023-10-22 DIAGNOSIS — K4090 Unilateral inguinal hernia, without obstruction or gangrene, not specified as recurrent: Secondary | ICD-10-CM | POA: Diagnosis not present

## 2023-10-22 DIAGNOSIS — Z48815 Encounter for surgical aftercare following surgery on the digestive system: Secondary | ICD-10-CM | POA: Diagnosis not present

## 2023-10-22 DIAGNOSIS — K449 Diaphragmatic hernia without obstruction or gangrene: Secondary | ICD-10-CM | POA: Diagnosis not present

## 2023-10-22 LAB — CBC
HCT: 35.5 % — ABNORMAL LOW (ref 36.0–46.0)
Hemoglobin: 12.3 g/dL (ref 12.0–15.0)
MCH: 29.9 pg (ref 26.0–34.0)
MCHC: 34.6 g/dL (ref 30.0–36.0)
MCV: 86.2 fL (ref 80.0–100.0)
Platelets: 163 10*3/uL (ref 150–400)
RBC: 4.12 MIL/uL (ref 3.87–5.11)
RDW: 13.1 % (ref 11.5–15.5)
WBC: 12.5 10*3/uL — ABNORMAL HIGH (ref 4.0–10.5)
nRBC: 0 % (ref 0.0–0.2)

## 2023-10-22 LAB — MAGNESIUM: Magnesium: 1.9 mg/dL (ref 1.7–2.4)

## 2023-10-22 LAB — COMPREHENSIVE METABOLIC PANEL WITH GFR
ALT: 183 U/L — ABNORMAL HIGH (ref 0–44)
AST: 146 U/L — ABNORMAL HIGH (ref 15–41)
Albumin: 3.6 g/dL (ref 3.5–5.0)
Alkaline Phosphatase: 58 U/L (ref 38–126)
Anion gap: 9 (ref 5–15)
BUN: 11 mg/dL (ref 8–23)
CO2: 21 mmol/L — ABNORMAL LOW (ref 22–32)
Calcium: 9 mg/dL (ref 8.9–10.3)
Chloride: 105 mmol/L (ref 98–111)
Creatinine, Ser: 0.76 mg/dL (ref 0.44–1.00)
GFR, Estimated: >60 mL/min (ref >60)
Glucose, Bld: 123 mg/dL — ABNORMAL HIGH (ref 70–99)
Potassium: 4.4 mmol/L (ref 3.5–5.1)
Sodium: 135 mmol/L (ref 135–145)
Total Bilirubin: 0.7 mg/dL (ref 0.0–1.2)
Total Protein: 6.7 g/dL (ref 6.5–8.1)

## 2023-10-22 LAB — HIV ANTIBODY (ROUTINE TESTING W REFLEX): HIV Screen 4th Generation wRfx: NONREACTIVE

## 2023-10-22 LAB — SURGICAL PATHOLOGY

## 2023-10-22 MED ORDER — KETOROLAC TROMETHAMINE 30 MG/ML IJ SOLN
INTRAMUSCULAR | Status: AC
  Filled 2023-10-22: qty 1

## 2023-10-22 MED ORDER — PSEUDOEPHEDRINE HCL 30 MG PO TABS
30.0000 mg | ORAL_TABLET | Freq: Four times a day (QID) | ORAL | Status: DC | PRN
  Administered 2023-10-22: 30 mg via ORAL
  Filled 2023-10-22: qty 1

## 2023-10-22 MED ORDER — PSEUDOEPHEDRINE HCL 30 MG PO TABS
60.0000 mg | ORAL_TABLET | Freq: Four times a day (QID) | ORAL | Status: DC | PRN
  Filled 2023-10-22 (×2): qty 2

## 2023-10-22 MED ORDER — OXYCODONE HCL 5 MG PO TABS
5.0000 mg | ORAL_TABLET | Freq: Four times a day (QID) | ORAL | 0 refills | Status: DC | PRN
Start: 2023-10-22 — End: 2023-11-05

## 2023-10-22 NOTE — Progress Notes (Signed)
 DISCHARGE NOTE:  Pt given discharge instructions and verbalized understanding., Pt wheeled to car by staff, husband providing transportation home.

## 2023-10-22 NOTE — Discharge Summary (Addendum)
 [facility] SURGICAL DISCHARGE SUMMARY  Patient ID: Colleen Washington MRN: [id number] DOB/AGE: 1960/07/18 63 y.o.  Admit date: 10/21/2023 Discharge date: 10/22/2023  Discharge Diagnoses Patient Active Problem List   Diagnosis Date Noted   S/P repair of paraesophageal hernia 10/21/2023    Consultants None  Procedures 10/21/2023:  Robotic assisted laparoscopic paraesophageal hernia repair Nissen fundoplication   HPI: Colleen Washington is a 63 y.o. female with history of hiatal hernia and GERD who presents to [facility] on 04/08 for scheduled repair with Dr [facility] Hospital Course: Informed consent was obtained and documented, and patient underwent uneventful robotic assisted laparoscopic paraesophageal hernia repair and Nissen fundoplication (Dr Everlene Farrier, 10/21/2023).  Post-operatively, patient did well. Advancement of patient's diet and ambulation were well-tolerated. The remainder of patient's hospital course was essentially unremarkable, and discharge planning was initiated accordingly with patient safely able to be discharged home with appropriate discharge instructions, pain control, and outpatient follow-up after all of her questions were answered to her expressed satisfaction.   Discharge Condition: Good   Physical Examination:  Constitutional: Well appearing female, NAD Pulmonary: Normal effort, no respiratory distress Gastrointestinal: Soft, non-tender, non-distended, no rebound/guarding Skin: Laparoscopic incisions are CDI with dermabond, no erythema    Allergies as of 10/22/2023       Reactions   Compazine [prochlorperazine] Swelling        Medication List     STOP taking these medications    omeprazole 40 MG capsule Commonly known as: PRILOSEC       TAKE these medications    albuterol 108 (90 Base) MCG/ACT inhaler Commonly known as: VENTOLIN HFA INHALE 1-2 PUFFS INTO THE LUNGS EVERY 4 HOURS AS NEEDED FOR WHEEZING OR SHORTNESS OF BREATH.   ALIVE WOMENS  50+ GUMMY PO Take 2 each by mouth daily.   atorvastatin 80 MG tablet Commonly known as: LIPITOR Take 1 tablet (80 mg total) by mouth daily.   buPROPion 150 MG 24 hr tablet Commonly known as: WELLBUTRIN XL TAKE 1 TABLET BY MOUTH EVERY DAY   cetirizine 10 MG tablet Commonly known as: ZyrTEC Allergy Take 1 tablet (10 mg total) by mouth daily.   fluticasone 50 MCG/ACT nasal spray Commonly known as: FLONASE Place 2 sprays into both nostrils daily.   oxyCODONE 5 MG immediate release tablet Commonly known as: Oxy IR/ROXICODONE Take 1 tablet (5 mg total) by mouth every 6 (six) hours as needed for severe pain (pain score 7-10) or breakthrough pain.   promethazine 12.5 MG tablet Commonly known as: PHENERGAN TAKE 1 TABLET (12.5 MG TOTAL) BY MOUTH EVERY 4 HOURS AS NEEDED FOR NAUSEA AND VOMITING   Skyrizi Pen 150 MG/ML pen Generic drug: risankizumab-rzaa Inject 1 mL (150 mg total) into the skin as directed. 1 sq injection q 12 weeks for maintenance   VITAMIN K2-VITAMIN D3 PO Take 1 tablet by mouth daily.          Follow-up Information     Leafy Ro, MD. Go on 11/05/2023.   Specialty: General Surgery Why: Go to appointment on 04/23 at 900 AM Contact information: [address] [place] [address] [phone]                  Time spent on discharge management including discussion of hospital course, clinical condition, outpatient instructions, prescriptions, and follow up with the patient and members of the medical team: >30 minutes  -- Lynden Oxford , PA-C [facility]  10/22/2023, 9:48 AM [phone] M-F:  7am - 4pm

## 2023-10-22 NOTE — Plan of Care (Signed)
  Problem: Activity: Goal: Risk for activity intolerance will decrease Outcome: Progressing   Problem: Elimination: Goal: Will not experience complications related to urinary retention Outcome: Progressing   Problem: Pain Managment: Goal: General experience of comfort will improve and/or be controlled Outcome: Progressing

## 2023-10-22 NOTE — Plan of Care (Signed)
 See chart

## 2023-10-22 NOTE — Telephone Encounter (Signed)
Faxed FMLA to [employer] at [phone] 

## 2023-11-04 ENCOUNTER — Telehealth: Payer: Self-pay | Admitting: *Deleted

## 2023-11-04 NOTE — Telephone Encounter (Signed)
 Faxed patients spouse Marily Konczal FMLA to [employer] at [phone]

## 2023-11-05 ENCOUNTER — Ambulatory Visit (INDEPENDENT_AMBULATORY_CARE_PROVIDER_SITE_OTHER): Admitting: Surgery

## 2023-11-05 ENCOUNTER — Encounter: Payer: Self-pay | Admitting: Surgery

## 2023-11-05 VITALS — BP 104/74 | HR 101 | Temp 98.1°F | Ht 67.5 in | Wt 206.0 lb

## 2023-11-05 DIAGNOSIS — K449 Diaphragmatic hernia without obstruction or gangrene: Secondary | ICD-10-CM

## 2023-11-05 DIAGNOSIS — Z09 Encounter for follow-up examination after completed treatment for conditions other than malignant neoplasm: Secondary | ICD-10-CM

## 2023-11-05 MED ORDER — ONDANSETRON HCL 4 MG PO TABS: 4.0000 mg | ORAL_TABLET | Freq: Three times a day (TID) | ORAL | 1 refills | Status: DC | PRN

## 2023-11-05 NOTE — Patient Instructions (Addendum)
 We have sent you in a prescription for Zofran  for your nausea.   You may start increasing your diet variety. Chew well and take small bites.  Wait at least 3 more weeks before eating bread.   Follow up here in 3 months.   Please call and ask to speak with a nurse if you develop questions or concerns.   GENERAL POST-OPERATIVE PATIENT INSTRUCTIONS   WOUND CARE INSTRUCTIONS: Try to keep the wound dry and avoid ointments on the wound unless directed to do so.  If the wound becomes bright red and painful or starts to drain infected material that is not clear, please contact your physician immediately.  If the wound is mildly pink and has a thick firm ridge underneath it, this is normal, and is referred to as a healing ridge.  This will resolve over the next 4-6 weeks.  BATHING: You may shower if you have been informed of this by your surgeon. However, Please do not submerge in a tub, hot tub, or pool until incisions are completely sealed or have been told by your surgeon that you may do so.  DIET:  You may eat any foods that you can tolerate.  It is a good idea to eat a high fiber diet and take in plenty of fluids to prevent constipation.  If you do become constipated you may want to take a mild laxative or take ducolax tablets on a daily basis until your bowel habits are regular.  Constipation can be very uncomfortable, along with straining, after recent surgery.  ACTIVITY: You should not lift more than 20 pounds for 6 weeks total after surgery as it could put you at increased risk for complications.  Twenty pounds is roughly equivalent to a plastic bag of groceries. At that time- Listen to your body when lifting, if you have pain when lifting, stop and then try again in a few days. Soreness after doing exercises or activities of daily living is normal as you get back in to your normal routine.  MEDICATIONS:  Try to take narcotic medications and anti-inflammatory medications, such as tylenol ,  ibuprofen, naprosyn, etc., with food.  This will minimize stomach upset from the medication.  Should you develop nausea and vomiting from the pain medication, or develop a rash, please discontinue the medication and contact your physician.  You should not drive, make important decisions, or operate machinery when taking narcotic pain medication.  SUNBLOCK Use sun block to incision area over the next year if this area will be exposed to sun. This helps decrease scarring and will allow you avoid a permanent darkened area over your incision.  QUESTIONS:  Please feel free to call our office if you have any questions, and we will be glad to assist you. [phone]

## 2023-11-09 NOTE — Progress Notes (Signed)
 S/p paraE H repair Doing well No dysphagia, no reflux, she feels well and happy to see improvement of preop sxs No fevers AVSS    PE NAd Chest CTA  Abd: soft, nt, incisions c/d/I no peritonitis  A/P Doing very well w/o complications RTC 3 months

## 2023-11-10 ENCOUNTER — Encounter: Payer: Self-pay | Admitting: Surgery

## 2023-11-26 ENCOUNTER — Encounter: Payer: Self-pay | Admitting: Internal Medicine

## 2023-12-01 ENCOUNTER — Other Ambulatory Visit: Payer: Self-pay

## 2023-12-01 ENCOUNTER — Telehealth: Payer: Self-pay | Admitting: Surgery

## 2023-12-01 MED ORDER — ONDANSETRON HCL 4 MG PO TABS: 4.0000 mg | ORAL_TABLET | Freq: Three times a day (TID) | ORAL | 1 refills | Status: DC | PRN

## 2023-12-01 NOTE — Telephone Encounter (Signed)
 Patient had paraesophageal hernia repair on 10/21/23 with Dr. Dana Duncan.  Patient states that she is still having a lot of nausea, just a sick feeling.  No vomiting.  Wants to know if we can increase 4 mg Zofran  to a higher dosage or add an alternative.  Please advise and call patient.

## 2023-12-09 ENCOUNTER — Other Ambulatory Visit: Payer: Self-pay | Admitting: Internal Medicine

## 2023-12-12 ENCOUNTER — Other Ambulatory Visit: Payer: Self-pay | Admitting: Internal Medicine

## 2023-12-12 NOTE — Telephone Encounter (Signed)
 LMTCB. Need to find out if pt requested this refill.

## 2023-12-17 ENCOUNTER — Ambulatory Visit (INDEPENDENT_AMBULATORY_CARE_PROVIDER_SITE_OTHER): Admitting: Surgery

## 2023-12-17 ENCOUNTER — Encounter: Payer: Self-pay | Admitting: Surgery

## 2023-12-17 VITALS — BP 122/81 | HR 106 | Temp 97.9°F | Ht 67.5 in | Wt 202.0 lb

## 2023-12-17 DIAGNOSIS — Z09 Encounter for follow-up examination after completed treatment for conditions other than malignant neoplasm: Secondary | ICD-10-CM

## 2023-12-17 DIAGNOSIS — K449 Diaphragmatic hernia without obstruction or gangrene: Secondary | ICD-10-CM

## 2023-12-17 MED ORDER — METOCLOPRAMIDE HCL 5 MG PO TABS: 5.0000 mg | ORAL_TABLET | Freq: Two times a day (BID) | ORAL | 0 refills | Status: DC

## 2023-12-17 NOTE — Patient Instructions (Addendum)
 Watch your sugar intake.  We have sent in a prescription for Reglan to your pharmacy to try. Take this twice a day.    You may want to consider a food diary.     Follow-up with our office as needed.  Please call and ask to speak with a nurse if you develop questions or concerns.

## 2023-12-19 NOTE — Progress Notes (Signed)
 Is 2 months out from robotic paraesophageal hernia repair.  She does have 1 week history of nausea.  No emesis.  No reflux.  Does have diarrhea but this is likely related to IBS.  No fevers no chills no swallowing issues  PE NAD Abd: Soft nontender incisions healed without evidence of infection no peritonitis  A/P doing that this is gastroenteritis or related to IBS.  Discussed with her about different options she does have significant allergies to antinausea medication.  Discussed with her short course of Reglan as this will be the only alternative that I see for now.  Also encouraged her p.o. hydration.  She continues may need to work Infectious enteritis by primary care.  No need for hospitalization or surgical intervention at this time.

## 2024-01-02 ENCOUNTER — Ambulatory Visit: Admitting: Physician Assistant

## 2024-01-08 ENCOUNTER — Other Ambulatory Visit: Payer: Self-pay | Admitting: Internal Medicine

## 2024-01-15 ENCOUNTER — Ambulatory Visit: Attending: Internal Medicine | Admitting: Internal Medicine

## 2024-01-15 VITALS — BP 118/80 | HR 67 | Ht 67.0 in | Wt 210.4 lb

## 2024-01-15 DIAGNOSIS — E7800 Pure hypercholesterolemia, unspecified: Secondary | ICD-10-CM

## 2024-01-15 DIAGNOSIS — I2510 Atherosclerotic heart disease of native coronary artery without angina pectoris: Secondary | ICD-10-CM | POA: Diagnosis not present

## 2024-01-15 DIAGNOSIS — I10 Essential (primary) hypertension: Secondary | ICD-10-CM | POA: Diagnosis not present

## 2024-01-15 NOTE — Patient Instructions (Addendum)
 Medication Instructions:  Hold atorvastatin  for 2 weeks, then contact us  with an update regarding muscle pain.  *If you need a refill on your cardiac medications before your next appointment, please call your pharmacy*  Lab Work: No labs ordered today    Testing/Procedures: No test ordered today   Follow-Up: At [facility], you and your health needs are our priority.  As part of our continuing mission to provide you with exceptional heart care, our providers are all part of one team.  This team includes your primary Cardiologist (physician) and Advanced Practice Providers or APPs (Physician Assistants and Nurse Practitioners) who all work together to provide you with the care you need, when you need it.  Your next appointment:   1 year(s)  Provider:   You may see Lonni Hanson, MD or one of the following Advanced Practice Providers on your designated Care Team:   Lonni Meager, NP Lesley Maffucci, PA-C Bernardino Bring, PA-C Cadence [place], PA-C Tylene Lunch, NP Barnie Hila, NP

## 2024-01-15 NOTE — Progress Notes (Signed)
 Cardiology Office Note:  .   Date:  01/16/2024  ID:  Colleen Washington, DOB [birth year], MRN [id number] PCP: Colleen Verneita CROME, MD  [facility] Providers Cardiologist:  Colleen Hanson, MD     History of Present Illness: .   Colleen Washington is a 63 y.o. female with history of nonobstructive coronary artery disease, hypertension, hyperlipidemia, irritable bowel syndrome, diverticulitis, endometriosis, depression, anxiety, and GERD, who presents for follow-up of chest pain.  I last saw her in 05/2021, at which time she was feeling fairly well without recurrent chest pain or shortness of breath.  Preceding coronary CTA showed mild-moderate proximal LAD disease with less than 50% stenosis.  She reached out to our office in May of this year, noting some gas-like chest pain that began after undergoing hiatal hernia repair about a month earlier.  Today, Colleen Washington reports that she has been doing well.  She has recovered from her hiatal hernia repair and has not had any further episodes of chest pain.  She also notes that prior palpitations that she always attributed to anxiety have resolved.  She still has some occasional heartburn depending on which foods she eats.  She notes some muscle aches and wonders if they could be due to her cholesterol medications.  She has intermittent edema of the left leg which dates back to injury in childhood.  It has not worsened since her hiatal hernia surgery earlier this year.  She remains off aspirin , which was held for her surgery.  ROS: See HPI  Studies Reviewed: SABRA   EKG Interpretation Date/Time:  Thursday January 15 2024 10:42:54 EDT Ventricular Rate:  60 PR Interval:  162 QRS Duration:  70 QT Interval:  424 QTC Calculation: 424 R Axis:   54  Text Interpretation: Normal sinus rhythm Low voltage QRS Borderline ECG When compared with ECG of 14-Oct-2023 14:56, Vent. rate has decreased BY  31 BPM Confirmed by Colleen Washington [phone]) on 01/16/2024 2:37:49 PM    Coronary  CTA (11/25/2019): Calcified plaque in the proximal LAD causing less than 50% stenosis.  No significant disease involving the LMCA, LCx, or RCA.  Coronary calcium  score 141 (95th percentile for age and sex matched controls).  Incidental note made of left breast soft tissue asymmetry as well as two right pulmonary nodules measuring up to 4 mm.  Risk Assessment/Calculations:             Physical Exam:   VS:  BP 118/80 (BP Location: Left Arm, Patient Position: Sitting, Cuff Size: Large)   Pulse 67   Ht 5' 7 (1.702 m)   Wt 210 lb 6.4 oz (95.4 kg)   SpO2 97%   BMI 32.95 kg/m    Wt Readings from Last 3 Encounters:  01/15/24 210 lb 6.4 oz (95.4 kg)  12/17/23 202 lb (91.6 kg)  11/05/23 206 lb (93.4 kg)    General:  NAD. Neck: No JVD or HJR. Lungs: Clear to auscultation bilaterally without wheezes or crackles. Heart: Regular rate and rhythm without murmurs, rubs, or gallops. Abdomen: Soft, nontender, nondistended. Extremities: No lower extremity edema.  ASSESSMENT AND PLAN: .    Nonobstructive coronary artery disease: No further chest pain reported following 2 episodes of gassy pain a few weeks after her hiatal hernia repair.  She does not have any exertional symptoms.  We will continue with secondary prevention, allowing for a statin holiday as outlined below.  I will reach out to her surgeon, Dr. Jordis, to see if it is  safe for her to resume low-dose aspirin .  Hypertension: Blood pressure well-controlled today off pharmacotherapy.  Hyperlipidemia: Colleen Washington notes some myalgias, which could be related to atorvastatin .  We have agreed to a 2-week statin holiday, after which time Colleen Washington will update us  on her symptoms.  If myalgias have resolved, we will need to consider resuming lower dose atorvastatin  versus trying an alternative agent like rosuvastatin.    Dispo: Return to clinic in 1 year.  Signed, Colleen Hanson, MD

## 2024-01-16 ENCOUNTER — Encounter: Payer: Self-pay | Admitting: Internal Medicine

## 2024-01-20 ENCOUNTER — Telehealth: Payer: Self-pay | Admitting: Internal Medicine

## 2024-01-20 DIAGNOSIS — Z79899 Other long term (current) drug therapy: Secondary | ICD-10-CM

## 2024-01-20 NOTE — Telephone Encounter (Signed)
 Please let Colleen Washington know that I have spoken with Dr. Jordis and he is okay with her resuming aspirin  81 mg daily.  I recommend that she begin taking this again and to alert us  if any concerns arise.  Lonni Hanson, MD [facility]

## 2024-01-20 NOTE — Telephone Encounter (Signed)
 Left a message for the patient to call back.

## 2024-01-22 NOTE — Telephone Encounter (Signed)
 Left a message for the patient to call back.

## 2024-01-26 MED ORDER — ASPIRIN 81 MG PO TBEC: 81.0000 mg | DELAYED_RELEASE_TABLET | Freq: Every day | ORAL | Status: AC

## 2024-01-26 NOTE — Telephone Encounter (Signed)
 Pt made aware of MD's recommendations and verbalized understanding. Aspirin  81 mg daily order placed

## 2024-02-03 ENCOUNTER — Ambulatory Visit: Payer: BC Managed Care – PPO | Admitting: Dermatology

## 2024-02-03 DIAGNOSIS — L409 Psoriasis, unspecified: Secondary | ICD-10-CM

## 2024-02-03 DIAGNOSIS — L609 Nail disorder, unspecified: Secondary | ICD-10-CM

## 2024-02-03 DIAGNOSIS — M955 Acquired deformity of pelvis: Secondary | ICD-10-CM | POA: Diagnosis not present

## 2024-02-03 DIAGNOSIS — M62830 Muscle spasm of back: Secondary | ICD-10-CM | POA: Diagnosis not present

## 2024-02-03 DIAGNOSIS — M9905 Segmental and somatic dysfunction of pelvic region: Secondary | ICD-10-CM | POA: Diagnosis not present

## 2024-02-03 DIAGNOSIS — Z7189 Other specified counseling: Secondary | ICD-10-CM

## 2024-02-03 DIAGNOSIS — L408 Other psoriasis: Secondary | ICD-10-CM | POA: Diagnosis not present

## 2024-02-03 DIAGNOSIS — M9903 Segmental and somatic dysfunction of lumbar region: Secondary | ICD-10-CM | POA: Diagnosis not present

## 2024-02-03 DIAGNOSIS — Z79899 Other long term (current) drug therapy: Secondary | ICD-10-CM

## 2024-02-03 NOTE — Patient Instructions (Addendum)
Reviewed risks of biologics including immunosuppression, infections, injection site reaction, and failure to improve condition. Goal is control of skin condition, not cure.  Some older biologics such as Humira and Enbrel may slightly increase risk of malignancy and may worsen congestive heart failure.  Taltz and Cosentyx may cause inflammatory bowel disease to flare. The use of biologics requires long term medication management, including periodic office visits and monitoring of blood work.     Due to recent changes in healthcare laws, you may see results of your pathology and/or laboratory studies on MyChart before the doctors have had a chance to review them. We understand that in some cases there may be results that are confusing or concerning to you. Please understand that not all results are received at the same time and often the doctors may need to interpret multiple results in order to provide you with the best plan of care or course of treatment. Therefore, we ask that you please give [place] 2 business days to thoroughly review all your results before contacting the office for clarification. Should we see a critical lab result, you will be contacted sooner.   If You Need Anything After Your Visit  If you have any questions or concerns for your doctor, please call our main line at [phone] and press option 4 to reach your doctor's medical assistant. If no one answers, please leave a voicemail as directed and we will return your call as soon as possible. Messages left after 4 pm will be answered the following business day.   You may also send [place] a message via MyChart. We typically respond to MyChart messages within 1-2 business days.  For prescription refills, please ask your pharmacy to contact our office. Our fax number is [phone].  If you have an urgent issue when the clinic is closed that cannot wait until the next business day, you can page your doctor at the number below.     Please note that while we do our best to be available for urgent issues outside of office hours, we are not available 24/7.   If you have an urgent issue and are unable to reach [place], you may choose to seek medical care at your doctor's office, retail clinic, urgent care center, or emergency room.  If you have a medical emergency, please immediately call 911 or go to the emergency department.  Pager Numbers  - Dr. Gwen Pounds: [phone]  - Dr. Roseanne Reno: [phone]  - Dr. Katrinka Blazing: [phone]   In the event of inclement weather, please call our main line at [phone] for an update on the status of any delays or closures.  Dermatology Medication Tips: Please keep the boxes that topical medications come in in order to help keep track of the instructions about where and how to use these. Pharmacies typically print the medication instructions only on the boxes and not directly on the medication tubes.   If your medication is too expensive, please contact our office at [phone] option 4 or send [place] a message through MyChart.   We are unable to tell what your co-pay for medications will be in advance as this is different depending on your insurance coverage. However, we may be able to find a substitute medication at lower cost or fill out paperwork to get insurance to cover a needed medication.   If a prior authorization is required to get your medication covered by your insurance company, please allow [place] 1-2 business days to complete this process.  Drug  prices often vary depending on where the prescription is filled and some pharmacies may offer cheaper prices.  The website www.goodrx.com contains coupons for medications through different pharmacies. The prices here do not account for what the cost may be with help from insurance (it may be cheaper with your insurance), but the website can give you the price if you did not use any insurance.  - You can print the associated coupon and  take it with your prescription to the pharmacy.  - You may also stop by our office during regular business hours and pick up a GoodRx coupon card.  - If you need your prescription sent electronically to a different pharmacy, notify our office through [facility] or by phone at [phone] option 4.     Si Usted Necesita Algo Despus de Su Visita  Tambin puede enviarnos un mensaje a travs de Clinical cytogeneticist. Por lo general respondemos a los mensajes de MyChart en el transcurso de 1 a 2 das hbiles.  Para renovar recetas, por favor pida a su farmacia que se ponga en contacto con nuestra oficina. Annie Sable de fax es [place] [phone].  Si tiene un asunto urgente cuando la clnica est cerrada y que no puede esperar hasta el siguiente da hbil, puede llamar/localizar a su doctor(a) al nmero que aparece a continuacin.   Por favor, tenga en cuenta que aunque hacemos todo lo posible para estar disponibles para asuntos urgentes fuera del horario de [place], no estamos disponibles las 24 horas del da, los 7 [address]  [address] de la [place].   Si tiene un problema urgente y no puede comunicarse con nosotros, puede optar por buscar atencin mdica  en el consultorio de su doctor(a), en una clnica privada, en un centro de atencin urgente o en una sala de emergencias.  Si tiene Engineer, drilling, por favor llame inmediatamente al 911 o vaya a la sala de emergencias.  Nmeros de bper  - Dr. Gwen Pounds: [phone]  - Dra. Roseanne Reno: [phone]  - Dr. Katrinka Blazing: [phone]   En caso de inclemencias del tiempo, por favor llame a Lacy Duverney principal al [phone] para una actualizacin sobre el [place] de cualquier retraso o cierre.  Consejos para la medicacin en dermatologa: Por favor, guarde las cajas en las que vienen los medicamentos de uso tpico para ayudarle a seguir las instrucciones sobre dnde y cmo usarlos. Las farmacias generalmente imprimen las instrucciones del medicamento slo  en las cajas y no directamente en los tubos del [place].   Si su medicamento es muy caro, por favor, pngase en contacto con Rolm Gala llamando al [phone] y presione la opcin 4 o envenos un mensaje a travs de Clinical cytogeneticist.   No podemos decirle cul ser su copago por los medicamentos por adelantado ya que esto es diferente dependiendo de la cobertura de su seguro. Sin embargo, es posible que podamos encontrar un medicamento sustituto a Audiological scientist un formulario para que el seguro cubra el medicamento que se considera necesario.   Si se requiere una autorizacin previa para que su compaa de seguros [place] su medicamento, por favor permtanos de 1 a 2 das hbiles para completar [address]  Los precios de los medicamentos varan con frecuencia dependiendo del Environmental consultant de dnde se surte la receta y alguna farmacias pueden ofrecer precios ms baratos.  El sitio web www.goodrx.com tiene cupones para medicamentos de Health and safety inspector. Los precios aqu no tienen en cuenta lo que podra costar con la ayuda del seguro (puede ser ms  barato con su seguro), pero el sitio web puede darle el precio si no Visual merchandiser.  - Puede imprimir el cupn correspondiente y llevarlo con su receta a la farmacia.  - Tambin puede pasar por nuestra oficina durante el horario de atencin regular y Education officer, museum una tarjeta de cupones de GoodRx.  - Si necesita que su receta se enve electrnicamente a una farmacia diferente, informe a nuestra oficina a travs de MyChart de [place] o por telfono llamando al [phone] y presione la opcin 4.

## 2024-02-03 NOTE — Progress Notes (Signed)
   Follow-Up Visit   Subjective  Colleen Washington is a 63 y.o. female who presents for the following: Psoriasis 6 month f/u. Patient on Skyrizi  injections every 12 weeks, no known side effects, Enstilar  foam. Had some flaring at palms, but reports her feet have improved. Patient reports she had a recent hernia surgery and unsure if flare has to do with that.   Updated labs done on 08/05/2023  The following portions of the chart were reviewed this encounter and updated as appropriate: medications, allergies, medical history  Review of Systems:  No other skin or systemic complaints except as noted in HPI or Assessment and Plan.  Objective  Well appearing patient in no apparent distress; mood and affect are within normal limits.  Areas Examined: Face,hands,feet   Relevant exam findings are noted in the Assessment and Plan.      Assessment & Plan     PSORIASIS on systemic treatment Exam: Small pustules on R palm. Mild hyperkeratosis at plantar feet.  <1% BSA.  Chronic condition with duration or expected duration over one year. Currently well-controlled on Skyrizi  injections.   Labs reviewed from 08/05/2023, WNL.    Counseling and coordination of care for severe psoriasis on systemic treatment  Psoriasis - severe on systemic treatment.  Psoriasis is a chronic non-curable, but treatable genetic/hereditary disease that may have other systemic features affecting other organ systems such as joints (Psoriatic Arthritis).  It is linked with heart disease, inflammatory bowel disease, non-alcoholic fatty liver disease, and depression. Significant skin psoriasis and/or psoriatic arthritis may have significant symptoms and affects activities of daily activity and often benefits from systemic treatments.  These systemic treatments have some potential side effects including immunosuppression and require pre-treatment laboratory screening and periodic laboratory monitoring and periodic in person  evaluation and monitoring by the attending dermatologist physician (long term medication management).   Patient denies joint pain  Treatment Plan: Continue Skyrizi  150 mg/1 mL sq injections q 12 wks  Continue Enstilar  foam qd aa hands, feet, elbows prn flares  Reviewed risks of biologics including immunosuppression, infections, injection site reaction, and failure to improve condition. Goal is control of skin condition, not cure.  Some older biologics such as Humira and Enbrel may slightly increase risk of malignancy and may worsen congestive heart failure.  Taltz and Cosentyx  may cause inflammatory bowel disease to flare. The use of biologics requires long term medication management, including periodic office visits and monitoring of blood work.   Long term medication management.  Patient is using long term (months to years) prescription medication  to control their dermatologic condition.  These medications require periodic monitoring to evaluate for efficacy and side effects and may require periodic laboratory monitoring.    NAIL PROBLEM Exam: dystrophic nail at L great toe with ingrown nail   Treatment Plan: Recommend making appointment with podiatrist to address ingrown toenail at L great toe.   Return in about 6 months (around 08/05/2024) for Psoriasis, w/ Dr. Jackquline.  I, Jacquelynn V. Wilfred, CMA, am acting as scribe for Rexene Jackquline, MD .   Documentation: I have reviewed the above documentation for accuracy and completeness, and I agree with the above.  Rexene Jackquline, MD

## 2024-02-11 ENCOUNTER — Ambulatory Visit: Admitting: Internal Medicine

## 2024-02-19 ENCOUNTER — Other Ambulatory Visit: Payer: Self-pay | Admitting: Internal Medicine

## 2024-02-19 ENCOUNTER — Ambulatory Visit: Admitting: Internal Medicine

## 2024-02-19 VITALS — BP 118/76 | HR 86 | Ht 67.0 in | Wt 209.6 lb

## 2024-02-19 DIAGNOSIS — R7301 Impaired fasting glucose: Secondary | ICD-10-CM | POA: Diagnosis not present

## 2024-02-19 DIAGNOSIS — E7800 Pure hypercholesterolemia, unspecified: Secondary | ICD-10-CM | POA: Diagnosis not present

## 2024-02-19 DIAGNOSIS — I2510 Atherosclerotic heart disease of native coronary artery without angina pectoris: Secondary | ICD-10-CM

## 2024-02-19 DIAGNOSIS — K2100 Gastro-esophageal reflux disease with esophagitis, without bleeding: Secondary | ICD-10-CM

## 2024-02-19 DIAGNOSIS — K5792 Diverticulitis of intestine, part unspecified, without perforation or abscess without bleeding: Secondary | ICD-10-CM | POA: Insufficient documentation

## 2024-02-19 DIAGNOSIS — K5790 Diverticulosis of intestine, part unspecified, without perforation or abscess without bleeding: Secondary | ICD-10-CM | POA: Insufficient documentation

## 2024-02-19 DIAGNOSIS — E041 Nontoxic single thyroid nodule: Secondary | ICD-10-CM

## 2024-02-19 DIAGNOSIS — N951 Menopausal and female climacteric states: Secondary | ICD-10-CM

## 2024-02-19 DIAGNOSIS — I10 Essential (primary) hypertension: Secondary | ICD-10-CM | POA: Diagnosis not present

## 2024-02-19 DIAGNOSIS — R41840 Attention and concentration deficit: Secondary | ICD-10-CM

## 2024-02-19 DIAGNOSIS — K449 Diaphragmatic hernia without obstruction or gangrene: Secondary | ICD-10-CM

## 2024-02-19 DIAGNOSIS — E66811 Obesity, class 1: Secondary | ICD-10-CM

## 2024-02-19 DIAGNOSIS — Z6832 Body mass index (BMI) 32.0-32.9, adult: Secondary | ICD-10-CM

## 2024-02-19 DIAGNOSIS — Z1231 Encounter for screening mammogram for malignant neoplasm of breast: Secondary | ICD-10-CM

## 2024-02-19 LAB — CBC WITH DIFFERENTIAL/PLATELET
Basophils Absolute: 0 K/uL (ref 0.0–0.1)
Basophils Relative: 0.7 % (ref 0.0–3.0)
Eosinophils Absolute: 0.3 K/uL (ref 0.0–0.7)
Eosinophils Relative: 7 % — ABNORMAL HIGH (ref 0.0–5.0)
HCT: 41.9 % (ref 36.0–46.0)
Hemoglobin: 13.8 g/dL (ref 12.0–15.0)
Lymphocytes Relative: 43.5 % (ref 12.0–46.0)
Lymphs Abs: 2.2 K/uL (ref 0.7–4.0)
MCHC: 33 g/dL (ref 30.0–36.0)
MCV: 86.3 fl (ref 78.0–100.0)
Monocytes Absolute: 0.4 K/uL (ref 0.1–1.0)
Monocytes Relative: 8.3 % (ref 3.0–12.0)
Neutro Abs: 2 K/uL (ref 1.4–7.7)
Neutrophils Relative %: 40.5 % — ABNORMAL LOW (ref 43.0–77.0)
Platelets: 197 K/uL (ref 150.0–400.0)
RBC: 4.86 Mil/uL (ref 3.87–5.11)
RDW: 13.9 % (ref 11.5–15.5)
WBC: 5 K/uL (ref 4.0–10.5)

## 2024-02-19 LAB — HEMOGLOBIN A1C: Hgb A1c MFr Bld: 6.1 % (ref 4.6–6.5)

## 2024-02-19 LAB — TSH: TSH: 1.42 u[IU]/mL (ref 0.35–5.50)

## 2024-02-19 MED ORDER — ONDANSETRON 4 MG PO TBDP: 4.0000 mg | ORAL_TABLET | Freq: Three times a day (TID) | ORAL | 0 refills | Status: DC | PRN

## 2024-02-19 MED ORDER — WEGOVY 0.25 MG/0.5ML ~~LOC~~ SOAJ: 0.2500 mg | SUBCUTANEOUS | 2 refills | Status: DC

## 2024-02-19 NOTE — Patient Instructions (Signed)
 I am  prescribing  Wegovy   to help you lose weight   and have sent the starting dose to [facility] which is  0.25 mg weekly   Wegovy  is a medication that is taken as a weekly subcutaneous injection. It  slows down the emptying of your stomach,  So it decreases your appetite and helps you lose weight.  The dose for the first 4 weekly doses is 0.25 mg.  You may have mild nausea on the first or second day and the zofran  (odansetron) is a sublingual anti nausea medication you can use for this.    If you tolerate the medication  and are losing weight,  refill the same dose.   Only  request an increase in the dose to 0.5 mg if you have not lost  any weight after 3  weeks , or  if your weight has plateaued.  Let me know when you need a refill and what dose you are taking.     EXERCISE EVERY DAY.  YOU NEED TO FOLLOW A HEALTHY LIFESTYLE,  AND EAT HEALTHY FOODS, JUST IN SMALLER QUANTITIES   REFERRAL TO [facility] FOR ADD TESTING IS IN PROCESS

## 2024-02-19 NOTE — Progress Notes (Signed)
 Subjective:  Patient ID: Colleen Washington, female    DOB: Oct 23, 1960  Age: 63 y.o. MRN: [id number]  CC: The primary encounter diagnosis was Encounter for screening mammogram for malignant neoplasm of breast. Diagnoses of Essential hypertension, Pure hypercholesterolemia, Thyroid  nodule, Impaired fasting glucose, Coronary artery disease involving native coronary artery of native heart without angina pectoris, Hiatal hernia, Symptoms, such as flushing, sleeplessness, headache, lack of concentration, associated with the menopause, Diverticulosis, Disturbed concentration, Gastroesophageal reflux disease with esophagitis without hemorrhage, and Obesity (BMI 30.0-34.9) were also pertinent to this visit.   HPI Colleen Washington presents for  Chief Complaint  Patient presents with  . Medical Management of Chronic Issues   1) obesity:  [facility] who offered ozempic but she deferred .  Tried  the Lip-mino injections for 3 months,  no weight loss (vitamins and lipophilics) .  During her  postop recovery period from hiatal hernia: she was on a liquid diet. For 6 weeks.  She ate premier protein  shakes ,  2 daily ,  sugar free jello,and  bone broth,  . She used  weight watchers to determine caloric content. And reports that she as keeping her intake to  under 1000 calories daily and walking  for 30 minutes daily  but only lost 8 lbs over a 6 week period   2) concentration issues  :  trouble finishing tasks , both at home and at work . Easily distracted, interrupts people frequently .  Resumed wellbutrin  currently at 150 mg daily .  no prior formal testing but treated with stimulants by Dr Deretha remotely.    3) HLD: has been holding atorvastatin  since July 3 visit with cardiology to assess effect on  muscle pain  4) s/p hiatal hernia repair in April  , palpitations have resolved   Outpatient Medications Prior to Visit  Medication Sig Dispense Refill  . albuterol  (VENTOLIN  HFA)  108 (90 Base) MCG/ACT inhaler INHALE 1-2 PUFFS INTO THE LUNGS EVERY 4 HOURS AS NEEDED FOR WHEEZING OR SHORTNESS OF BREATH. 18 each 2  . aspirin  EC 81 MG tablet Take 1 tablet (81 mg total) by mouth daily. Swallow whole.    . buPROPion  (WELLBUTRIN  XL) 150 MG 24 hr tablet TAKE 1 TABLET BY MOUTH EVERY DAY 90 tablet 1  . cetirizine  (ZYRTEC  ALLERGY) 10 MG tablet Take 1 tablet (10 mg total) by mouth daily. 90 tablet 1  . fluticasone  (FLONASE ) 50 MCG/ACT nasal spray Place 2 sprays into both nostrils daily. 16 g 6  . Multiple Vitamins-Minerals (ALIVE WOMENS 50+ GUMMY PO) Take 2 each by mouth daily.    . promethazine  (PHENERGAN ) 12.5 MG tablet TAKE 1 TABLET (12.5 MG TOTAL) BY MOUTH EVERY 4 HOURS AS NEEDED FOR NAUSEA AND VOMITING 30 tablet 0  . risankizumab -rzaa (SKYRIZI  PEN) 150 MG/ML pen Inject 1 mL (150 mg total) into the skin as directed. 1 sq injection q 12 weeks for maintenance 1 mL 2  . Vitamin D -Vitamin K (VITAMIN K2-VITAMIN D3 PO) Take 1 tablet by mouth daily.    . atorvastatin  (LIPITOR) 80 MG tablet Take 1 tablet (80 mg total) by mouth daily. 90 tablet 3  . ondansetron  (ZOFRAN ) 4 MG tablet Take 1 tablet (4 mg total) by mouth every 8 (eight) hours as needed for nausea or vomiting. 20 tablet 1  . metoCLOPramide  (REGLAN ) 5 MG tablet Take 1 tablet (5 mg total) by mouth 2 (two) times daily for 14 days. 28  tablet 0   No facility-administered medications prior to visit.    Review of Systems;  Patient denies headache, fevers, malaise, unintentional weight loss, skin rash, eye pain, sinus congestion and sinus pain, sore throat, dysphagia,  hemoptysis , cough, dyspnea, wheezing, chest pain, palpitations, orthopnea, edema, abdominal pain, nausea, melena, diarrhea, constipation, flank pain, dysuria, hematuria, urinary  Frequency, nocturia, numbness, tingling, seizures,  Focal weakness, Loss of consciousness,  Tremor, insomnia, depression, anxiety, and suicidal ideation.      Objective:  BP 118/76   Pulse  86   Ht 5' 7 (1.702 m)   Wt 209 lb 9.6 oz (95.1 kg)   SpO2 97%   BMI 32.83 kg/m   BP Readings from Last 3 Encounters:  02/19/24 118/76  01/15/24 118/80  12/17/23 122/81    Wt Readings from Last 3 Encounters:  02/19/24 209 lb 9.6 oz (95.1 kg)  01/15/24 210 lb 6.4 oz (95.4 kg)  12/17/23 202 lb (91.6 kg)    Physical Exam Vitals reviewed.  Constitutional:      General: She is not in acute distress.    Appearance: Normal appearance. She is normal weight. She is not ill-appearing, toxic-appearing or diaphoretic.  HENT:     Head: Normocephalic.  Eyes:     General: No scleral icterus.       Right eye: No discharge.        Left eye: No discharge.     Conjunctiva/sclera: Conjunctivae normal.  Cardiovascular:     Rate and Rhythm: Normal rate and regular rhythm.     Heart sounds: Normal heart sounds.  Pulmonary:     Effort: Pulmonary effort is normal. No respiratory distress.     Breath sounds: Normal breath sounds.  Musculoskeletal:        General: Normal range of motion.  Skin:    General: Skin is warm and dry.  Neurological:     General: No focal deficit present.     Mental Status: She is alert and oriented to person, place, and time. Mental status is at baseline.  Psychiatric:        Mood and Affect: Mood normal.        Behavior: Behavior normal.        Thought Content: Thought content normal.        Judgment: Judgment normal.    Lab Results  Component Value Date   HGBA1C 6.1 02/19/2024   HGBA1C 5.6 07/19/2022   HGBA1C 5.5 12/20/2021    Lab Results  Component Value Date   CREATININE 0.82 02/19/2024   CREATININE 0.76 10/22/2023   CREATININE 0.89 08/05/2023    Lab Results  Component Value Date   WBC 5.0 02/19/2024   HGB 13.8 02/19/2024   HCT 41.9 02/19/2024   PLT 197.0 02/19/2024   GLUCOSE 95 02/19/2024   CHOL 259 (H) 02/19/2024   TRIG 158.0 (H) 02/19/2024   HDL 56.10 02/19/2024   LDLDIRECT 173.0 02/19/2024   LDLCALC 171 (H) 02/19/2024   ALT 23  02/19/2024   AST 20 02/19/2024   NA 139 02/19/2024   K 4.5 02/19/2024   CL 102 02/19/2024   CREATININE 0.82 02/19/2024   BUN 9 02/19/2024   CO2 26 02/19/2024   TSH 1.42 02/19/2024   HGBA1C 6.1 02/19/2024    No results found.  Assessment & Plan:  .Encounter for screening mammogram for malignant neoplasm of breast -     3D Screening Mammogram, Left and Right; Future  Essential hypertension Assessment & Plan: Well controlled  on current regimen. Renal function stable, no changes today.   Orders: -     Comprehensive metabolic panel with GFR  Pure hypercholesterolemia Assessment & Plan: Previously managed with atorvastatin , now suspended ,for a 2 week holiday which should have ended late July .  Will recommend resuming statin for goal LDL now > 160  Lab Results  Component Value Date   CHOL 259 (H) 02/19/2024   HDL 56.10 02/19/2024   LDLCALC 171 (H) 02/19/2024   LDLDIRECT 173.0 02/19/2024   TRIG 158.0 (H) 02/19/2024   CHOLHDL 5 02/19/2024      Orders: -     Lipid panel -     LDL cholesterol, direct -     CBC with Differential/Platelet  Thyroid  nodule -     TSH  Impaired fasting glucose -     Comprehensive metabolic panel with GFR -     Hemoglobin A1c  Coronary artery disease involving native coronary artery of native heart without angina pectoris  Hiatal hernia Assessment & Plan: Robotic assisted laparoscopic repair of paraesophageal hernia with Bio-A Mesh and Nissen fundoplication  by [facility]  April 2025    Symptoms, such as flushing, sleeplessness, headache, lack of concentration, associated with the menopause Assessment & Plan: Her symptoms spontaneously abated in 2022 but have returned    Diverticulosis Assessment & Plan: History of sigmoid diverticulitis Dec 2024 resolved without complication    Disturbed concentration -     Ambulatory referral to Psychology  Gastroesophageal reflux disease with esophagitis without hemorrhage Assessment &  Plan: Resolved with surgical correction of Hiatal hernia   Obesity (BMI 30.0-34.9) Assessment & Plan:   She has trouble losing weight .    Patient has no contraindications to using GLP 1 agonist.  The risks  and benefits were reviewed, and instructions on titration of dose were outlined.  .     Other orders -     Wegovy ; Inject 0.25 mg into the skin once a week.  Dispense: 2 mL; Refill: 2 -     Ondansetron ; Take 1 tablet (4 mg total) by mouth every 8 (eight) hours as needed for nausea or vomiting.  Dispense: 20 tablet; Refill: 0     I spent 34 minutes on the day of this face to face encounter reviewing patient's  most recent visit with cardiology,  nephrology,  and neurology,  prior relevant surgical and non surgical procedures, recent  labs and imaging studies, counseling on weight management,  reviewing the assessment and plan with patient, and post visit ordering and reviewing of  diagnostics and therapeutics with patient  .   Follow-up: No follow-ups on file.   Verneita LITTIE Kettering, MD

## 2024-02-19 NOTE — Assessment & Plan Note (Addendum)
 Robotic assisted laparoscopic repair of paraesophageal hernia with Bio-A Mesh and Nissen fundoplication  by [facility]  April 2025

## 2024-02-19 NOTE — Assessment & Plan Note (Addendum)
 Her symptoms spontaneously abated in 2022 but have returned

## 2024-02-19 NOTE — Assessment & Plan Note (Signed)
 History of sigmoid diverticulitis Dec 2024 resolved without complication

## 2024-02-20 LAB — COMPREHENSIVE METABOLIC PANEL WITH GFR
ALT: 23 U/L (ref 0–35)
AST: 20 U/L (ref 0–37)
Albumin: 4.5 g/dL (ref 3.5–5.2)
Alkaline Phosphatase: 74 U/L (ref 39–117)
BUN: 9 mg/dL (ref 6–23)
CO2: 26 meq/L (ref 19–32)
Calcium: 9.5 mg/dL (ref 8.4–10.5)
Chloride: 102 meq/L (ref 96–112)
Creatinine, Ser: 0.82 mg/dL (ref 0.40–1.20)
GFR: 76.96 mL/min (ref >60.00)
Glucose, Bld: 95 mg/dL (ref 70–99)
Potassium: 4.5 meq/L (ref 3.5–5.1)
Sodium: 139 meq/L (ref 135–145)
Total Bilirubin: 0.7 mg/dL (ref 0.2–1.2)
Total Protein: 7.3 g/dL (ref 6.0–8.3)

## 2024-02-20 LAB — LIPID PANEL
Cholesterol: 259 mg/dL — ABNORMAL HIGH (ref 0–200)
HDL: 56.1 mg/dL (ref >39.00)
LDL Cholesterol: 171 mg/dL — ABNORMAL HIGH (ref 0–99)
NonHDL: 202.89
Total CHOL/HDL Ratio: 5
Triglycerides: 158 mg/dL — ABNORMAL HIGH (ref 0.0–149.0)
VLDL: 31.6 mg/dL (ref 0.0–40.0)

## 2024-02-20 LAB — LDL CHOLESTEROL, DIRECT: Direct LDL: 173 mg/dL

## 2024-02-22 ENCOUNTER — Ambulatory Visit: Payer: Self-pay | Admitting: Internal Medicine

## 2024-02-22 NOTE — Assessment & Plan Note (Signed)
Well controlled on current regimen. Renal function stable, no changes today. 

## 2024-02-22 NOTE — Assessment & Plan Note (Addendum)
 Previously managed with atorvastatin , now suspended ,for a 2 week holiday which should have ended late July .  Will recommend resuming statin for goal LDL now > 160  Lab Results  Component Value Date   CHOL 259 (H) 02/19/2024   HDL 56.10 02/19/2024   LDLCALC 171 (H) 02/19/2024   LDLDIRECT 173.0 02/19/2024   TRIG 158.0 (H) 02/19/2024   CHOLHDL 5 02/19/2024

## 2024-02-22 NOTE — Assessment & Plan Note (Signed)
 Resolved with surgical correction of Hiatal hernia

## 2024-02-22 NOTE — Assessment & Plan Note (Signed)
 She has trouble losing weight .    Patient has no contraindications to using GLP 1 agonist.  The risks  and benefits were reviewed, and instructions on titration of dose were outlined.  SABRA

## 2024-02-23 ENCOUNTER — Encounter: Payer: Self-pay | Admitting: Internal Medicine

## 2024-02-23 NOTE — Telephone Encounter (Signed)
 Wegovy  is not covered. Pharmacy is requesting an alternative.

## 2024-02-24 ENCOUNTER — Other Ambulatory Visit: Payer: Self-pay | Admitting: *Deleted

## 2024-02-24 DIAGNOSIS — E7800 Pure hypercholesterolemia, unspecified: Secondary | ICD-10-CM

## 2024-02-24 DIAGNOSIS — Z79899 Other long term (current) drug therapy: Secondary | ICD-10-CM

## 2024-02-24 DIAGNOSIS — I2510 Atherosclerotic heart disease of native coronary artery without angina pectoris: Secondary | ICD-10-CM

## 2024-02-24 MED ORDER — ATORVASTATIN CALCIUM 80 MG PO TABS: 40.0000 mg | ORAL_TABLET | Freq: Every day | ORAL | Status: DC

## 2024-02-24 MED ORDER — ATORVASTATIN CALCIUM 40 MG PO TABS: 40.0000 mg | ORAL_TABLET | Freq: Every day | ORAL | 3 refills | Status: DC

## 2024-02-24 NOTE — Progress Notes (Signed)
     Mady Bruckner, MD to Desiderio Russell SAILOR, RN    02/24/24  7:02 AM Hi Russell,   Can you have Ms. Dinunzio get a lipid panel and ALT in ~3 months?  Thanks.   [place]

## 2024-03-02 DIAGNOSIS — M62830 Muscle spasm of back: Secondary | ICD-10-CM | POA: Diagnosis not present

## 2024-03-02 DIAGNOSIS — M9903 Segmental and somatic dysfunction of lumbar region: Secondary | ICD-10-CM | POA: Diagnosis not present

## 2024-03-02 DIAGNOSIS — M9905 Segmental and somatic dysfunction of pelvic region: Secondary | ICD-10-CM | POA: Diagnosis not present

## 2024-03-02 DIAGNOSIS — M955 Acquired deformity of pelvis: Secondary | ICD-10-CM | POA: Diagnosis not present

## 2024-03-09 ENCOUNTER — Encounter: Payer: Self-pay | Admitting: Internal Medicine

## 2024-03-10 NOTE — Telephone Encounter (Signed)
 I recommend that we replace atorvastatin  with rosuvastatin 5 mg daily and check a lipid panel and ALT in ~3 months.  Lonni Hanson, MD [facility]

## 2024-03-11 ENCOUNTER — Ambulatory Visit

## 2024-03-11 ENCOUNTER — Emergency Department
Admission: EM | Admit: 2024-03-11 | Discharge: 2024-03-11 | Disposition: A | Discharge status: Home / Self Care | Source: Ambulatory Visit | Attending: Emergency Medicine | Admitting: Emergency Medicine

## 2024-03-11 ENCOUNTER — Emergency Department

## 2024-03-11 ENCOUNTER — Other Ambulatory Visit: Payer: Self-pay

## 2024-03-11 ENCOUNTER — Encounter: Payer: Self-pay | Admitting: Emergency Medicine

## 2024-03-11 ENCOUNTER — Telehealth: Payer: Self-pay | Admitting: Surgery

## 2024-03-11 DIAGNOSIS — F17200 Nicotine dependence, unspecified, uncomplicated: Secondary | ICD-10-CM | POA: Insufficient documentation

## 2024-03-11 DIAGNOSIS — I2510 Atherosclerotic heart disease of native coronary artery without angina pectoris: Secondary | ICD-10-CM | POA: Diagnosis not present

## 2024-03-11 DIAGNOSIS — K5792 Diverticulitis of intestine, part unspecified, without perforation or abscess without bleeding: Secondary | ICD-10-CM | POA: Diagnosis not present

## 2024-03-11 DIAGNOSIS — Z90710 Acquired absence of both cervix and uterus: Secondary | ICD-10-CM | POA: Diagnosis not present

## 2024-03-11 DIAGNOSIS — Z8616 Personal history of COVID-19: Secondary | ICD-10-CM | POA: Diagnosis not present

## 2024-03-11 DIAGNOSIS — K5732 Diverticulitis of large intestine without perforation or abscess without bleeding: Secondary | ICD-10-CM | POA: Insufficient documentation

## 2024-03-11 DIAGNOSIS — R1032 Left lower quadrant pain: Secondary | ICD-10-CM | POA: Diagnosis not present

## 2024-03-11 LAB — CBC
HCT: 41 % (ref 36.0–46.0)
Hemoglobin: 13.5 g/dL (ref 12.0–15.0)
MCH: 29.4 pg (ref 26.0–34.0)
MCHC: 32.9 g/dL (ref 30.0–36.0)
MCV: 89.3 fL (ref 80.0–100.0)
Platelets: 178 K/uL (ref 150–400)
RBC: 4.59 MIL/uL (ref 3.87–5.11)
RDW: 13.1 % (ref 11.5–15.5)
WBC: 8 K/uL (ref 4.0–10.5)
nRBC: 0 % (ref 0.0–0.2)

## 2024-03-11 LAB — COMPREHENSIVE METABOLIC PANEL WITH GFR
ALT: 18 U/L (ref 0–44)
AST: 19 U/L (ref 15–41)
Albumin: 3.9 g/dL (ref 3.5–5.0)
Alkaline Phosphatase: 70 U/L (ref 38–126)
Anion gap: 9 (ref 5–15)
BUN: 9 mg/dL (ref 8–23)
CO2: 25 mmol/L (ref 22–32)
Calcium: 9.2 mg/dL (ref 8.9–10.3)
Chloride: 105 mmol/L (ref 98–111)
Creatinine, Ser: 0.82 mg/dL (ref 0.44–1.00)
GFR, Estimated: >60 mL/min (ref >60)
Glucose, Bld: 167 mg/dL — ABNORMAL HIGH (ref 70–99)
Potassium: 4.1 mmol/L (ref 3.5–5.1)
Sodium: 139 mmol/L (ref 135–145)
Total Bilirubin: 1.2 mg/dL (ref 0.0–1.2)
Total Protein: 7.3 g/dL (ref 6.5–8.1)

## 2024-03-11 LAB — URINALYSIS, ROUTINE W REFLEX MICROSCOPIC
Bilirubin Urine: NEGATIVE
Glucose, UA: NEGATIVE mg/dL
Hgb urine dipstick: NEGATIVE
Ketones, ur: NEGATIVE mg/dL
Leukocytes,Ua: NEGATIVE
Nitrite: NEGATIVE
Protein, ur: NEGATIVE mg/dL
Specific Gravity, Urine: 1.011 (ref 1.005–1.030)
pH: 6 (ref 5.0–8.0)

## 2024-03-11 LAB — LIPASE, BLOOD: Lipase: 25 U/L (ref 11–51)

## 2024-03-11 MED ORDER — FLUCONAZOLE 150 MG PO TABS: 150.0000 mg | ORAL_TABLET | ORAL | 0 refills | Status: AC

## 2024-03-11 MED ORDER — AMOXICILLIN-POT CLAVULANATE 875-125 MG PO TABS: 1.0000 | ORAL_TABLET | Freq: Two times a day (BID) | ORAL | 0 refills | Status: AC

## 2024-03-11 MED ORDER — ONDANSETRON HCL 4 MG/2ML IJ SOLN
4.0000 mg | Freq: Once | INTRAMUSCULAR | Status: AC
  Administered 2024-03-11: 4 mg via INTRAVENOUS
  Filled 2024-03-11: qty 2

## 2024-03-11 MED ORDER — KETOROLAC TROMETHAMINE 15 MG/ML IJ SOLN
15.0000 mg | Freq: Once | INTRAMUSCULAR | Status: AC
  Administered 2024-03-11: 15 mg via INTRAVENOUS
  Filled 2024-03-11: qty 1

## 2024-03-11 MED ORDER — IOHEXOL 300 MG/ML  SOLN
100.0000 mL | Freq: Once | INTRAMUSCULAR | Status: AC | PRN
  Administered 2024-03-11: 100 mL via INTRAVENOUS

## 2024-03-11 NOTE — ED Provider Notes (Signed)
 [facility] Provider Note    Event Date/Time   First MD Initiated Contact with Patient 03/11/24 1142     (approximate)   History   Abdominal Pain   HPI  Colleen Washington is a 63 y.o. female with a past medical history of diverticulosis, endometriosis status post hysterectomy, hiatal hernia status post Niesen fundoplication who presents today for evaluation of left lower quadrant abdominal pain.  Patient reports that her symptoms began today.  She reports that her pain is in her lower abdomen into her groin.  She denies any pain in her back.  She has not had any urinary symptoms.  She denies any diarrhea or bloody stool.  She is nauseated but is not had any vomiting.  She denies fevers or chills.  Patient Active Problem List   Diagnosis Date Noted   Diverticulosis 02/19/2024   S/P repair of paraesophageal hernia 10/21/2023   Polyp of transverse colon 05/22/2023   Gastroesophageal reflux disease 05/22/2023   Colon cancer screening 05/22/2023   Caregiver stress 11/25/2022   Grief reaction 10/07/2022   Hiatal hernia 12/18/2021   Coronary artery disease involving native coronary artery of native heart without angina pectoris 05/18/2021   Leg edema 05/18/2021   Right arm pain 10/03/2020   History of COVID-19 07/28/2020   Chest pain of uncertain etiology 10/22/2019   Dyspnea on exertion 10/22/2019   Palpitations 10/22/2019   PAC (premature atrial contraction) 10/22/2019   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   SVT (supraventricular tachycardia) (HCC) 08/15/2018   Thyroid  nodule 10/28/2016   Ovarian cyst, right 09/30/2015   Endometriosis 08/10/2015   Status post vaginal hysterectomy 08/10/2015   Anxiety 08/10/2015   Obesity (BMI 30.0-34.9) 08/10/2015   Vitamin D  deficiency 05/26/2015   Weight gain 02/22/2015   Menopause 09/02/2014   Visit for preventive health examination 08/24/2013   Snoring 08/24/2013   Symptoms, such as flushing, sleeplessness,  headache, lack of concentration, associated with the menopause 08/24/2013   Encounter for drug screening 08/13/2013   Migraine headache 08/26/2012   Panic disorder without agoraphobia with mild panic attacks 08/26/2012   Bronchitis, chronic (HCC) 05/09/2012   History of diverticulitis of colon    Reflux esophagitis    Hyperlipidemia    Irritable bowel syndrome    Herniated intervertebral disc    Tobacco use 03/04/2012          Physical Exam   Triage Vital Signs: ED Triage Vitals  Encounter Vitals Group     BP 03/11/24 1133 (!) 116/90     Girls Systolic BP Percentile --      Girls Diastolic BP Percentile --      Boys Systolic BP Percentile --      Boys Diastolic BP Percentile --      Pulse Rate 03/11/24 1133 99     Resp 03/11/24 1133 18     Temp 03/11/24 1133 98 F (36.7 C)     Temp src --      SpO2 03/11/24 1133 97 %     Weight 03/11/24 1131 205 lb (93 kg)     Height --      Head Circumference --      Peak Flow --      Pain Score 03/11/24 1131 5     Pain Loc --      Pain Education --      Exclude from Growth Chart --     Most recent vital signs: Vitals:   03/11/24 1133  BP: (!) 116/90  Pulse: 99  Resp: 18  Temp: 98 F (36.7 C)  SpO2: 97%    Physical Exam Vitals and nursing note reviewed.  Constitutional:      General: Awake and alert. No acute distress.    Appearance: Normal appearance. The patient is normal weight.  HENT:     Head: Normocephalic and atraumatic.     Mouth: Mucous membranes are moist.  Eyes:     General: PERRL. Normal EOMs        Right eye: No discharge.        Left eye: No discharge.     Conjunctiva/sclera: Conjunctivae normal.  Cardiovascular:     Rate and Rhythm: Normal rate and regular rhythm.     Pulses: Normal pulses.  Pulmonary:     Effort: Pulmonary effort is normal. No respiratory distress.     Breath sounds: Normal breath sounds.  Abdominal:     Abdomen is soft. There is mild left lower quadrant abdominal tenderness.   No rebound or guarding. No distention. Musculoskeletal:        General: No swelling. Normal range of motion.     Cervical back: Normal range of motion and neck supple.  Skin:    General: Skin is warm and dry.     Capillary Refill: Capillary refill takes less than 2 seconds.     Findings: No rash.  Neurological:     Mental Status: The patient is awake and alert.      ED Results / Procedures / Treatments   Labs (all labs ordered are listed, but only abnormal results are displayed) Labs Reviewed  COMPREHENSIVE METABOLIC PANEL WITH GFR - Abnormal; Notable for the following components:      Result Value   Glucose, Bld 167 (*)    All other components within normal limits  URINALYSIS, ROUTINE W REFLEX MICROSCOPIC - Abnormal; Notable for the following components:   Color, Urine YELLOW (*)    APPearance CLEAR (*)    All other components within normal limits  LIPASE, BLOOD  CBC     EKG     RADIOLOGY I independently reviewed and interpreted imaging and agree with radiologists findings.     PROCEDURES:  Critical Care performed:   Procedures   MEDICATIONS ORDERED IN ED: Medications  ondansetron  (ZOFRAN ) injection 4 mg (4 mg Intravenous Given 03/11/24 1234)  ketorolac  (TORADOL ) 15 MG/ML injection 15 mg (15 mg Intravenous Given 03/11/24 1236)  iohexol  (OMNIPAQUE ) 300 MG/ML solution 100 mL (100 mLs Intravenous Contrast Given 03/11/24 1254)     IMPRESSION / MDM / ASSESSMENT AND PLAN / ED COURSE  I reviewed the triage vital signs and the nursing notes.   Differential diagnosis includes, but is not limited to, diverticulitis, urinary tract infection, nephrolithiasis, ureteral colic.  I reviewed the patient's chart.  Patient called her surgeon today and was advised to go to the emergency department for further evaluation.  Patient is awake and alert, hemodynamically stable and afebrile.  She is nontoxic in appearance.  Labs obtained in triage are overall reassuring.   Recommended further evaluation with CT scan for evaluation of diverticulitis or other intra-abdominal pathology.  Patient is in agreement with this plan.  She was treated symptomatically with Zofran  and Toradol  with good effect.   CT scan obtained reveals findings consistent with uncomplicated diverticulitis.  Discussed this finding with the patient.  She was started on Augmentin  for management.  Discussed the importance of close outpatient follow-up.  We also discussed strict  return precautions for signs of developing complicated diverticulitis which was discussed with her at length.  Patient is comfortable with this plan.  Discharged in stable condition.   Patient's presentation is most consistent with acute presentation with potential threat to life or bodily function.    FINAL CLINICAL IMPRESSION(S) / ED DIAGNOSES   Final diagnoses:  Diverticulitis     Rx / DC Orders   ED Discharge Orders          Ordered    amoxicillin -clavulanate (AUGMENTIN ) 875-125 MG tablet  2 times daily        03/11/24 1333    fluconazole  (DIFLUCAN ) 150 MG tablet  Weekly        03/11/24 1351             Note:  This document was prepared using Dragon voice recognition software and may include unintentional dictation errors.   Azariel Banik E, PA-C 03/11/24 1500    Dorothyann Drivers, MD 03/12/24 1340

## 2024-03-11 NOTE — Discharge Instructions (Addendum)
 Please take antibiotics as prescribed for your diverticulitis.  Please return for any new, worsening, or change in symptoms or other concerns.  It was a pleasure caring for you today.

## 2024-03-11 NOTE — ED Triage Notes (Signed)
 PT presents with LLQ/left groin stabbing pain with nausea started this morning. Denies any vomiting/diarrhea. Hx of diverticulitis and concerned with flare up. Denies any urinary symptoms.

## 2024-03-11 NOTE — ED Notes (Signed)
 See triage note  Presents with pain to LLQ  States this started this am   Describes sharp  Pos nausea   No fever

## 2024-03-11 NOTE — Telephone Encounter (Signed)
 Spoke with the patient and she is having pain in the left groin area at the leg crease. No noticeable bulge in the area. No report of fever, but does have some chills and some nausea. She has schedule for an Urgent Care appointment for today. Patient advised that if the pain worsens or she develops a fever she should go to the ED. She is aware and will call back with any further questions.

## 2024-03-11 NOTE — Telephone Encounter (Signed)
 Pt coming in next week for a 3 month followup but pt is having some stabing pains in the groin area x 1 week. Has taken gas medication and she is sick on her stomach and taking zofran . Please advise. Pt # is [phone]

## 2024-03-16 DIAGNOSIS — M955 Acquired deformity of pelvis: Secondary | ICD-10-CM | POA: Diagnosis not present

## 2024-03-16 DIAGNOSIS — M9905 Segmental and somatic dysfunction of pelvic region: Secondary | ICD-10-CM | POA: Diagnosis not present

## 2024-03-16 DIAGNOSIS — M62830 Muscle spasm of back: Secondary | ICD-10-CM | POA: Diagnosis not present

## 2024-03-16 DIAGNOSIS — M9903 Segmental and somatic dysfunction of lumbar region: Secondary | ICD-10-CM | POA: Diagnosis not present

## 2024-03-17 ENCOUNTER — Ambulatory Visit (INDEPENDENT_AMBULATORY_CARE_PROVIDER_SITE_OTHER): Admitting: Surgery

## 2024-03-17 ENCOUNTER — Encounter: Payer: Self-pay | Admitting: Surgery

## 2024-03-17 VITALS — BP 100/69 | HR 89 | Ht 67.0 in | Wt 203.0 lb

## 2024-03-17 DIAGNOSIS — K5732 Diverticulitis of large intestine without perforation or abscess without bleeding: Secondary | ICD-10-CM

## 2024-03-17 DIAGNOSIS — R110 Nausea: Secondary | ICD-10-CM

## 2024-03-17 MED ORDER — ROSUVASTATIN CALCIUM 5 MG PO TABS: 5.0000 mg | ORAL_TABLET | Freq: Every day | ORAL | 3 refills | Status: DC

## 2024-03-17 MED ORDER — ONDANSETRON HCL 4 MG PO TABS: 4.0000 mg | ORAL_TABLET | Freq: Three times a day (TID) | ORAL | 1 refills | Status: AC | PRN

## 2024-03-17 NOTE — Patient Instructions (Signed)
   Follow-up with our office as needed.  Please call and ask to speak with a nurse if you develop questions or concerns.

## 2024-03-18 ENCOUNTER — Encounter: Payer: Self-pay | Admitting: Internal Medicine

## 2024-03-18 NOTE — Telephone Encounter (Signed)
LMTCB to triage pt.

## 2024-03-18 NOTE — Progress Notes (Signed)
 Outpatient Surgical Follow Up  03/18/2024  Colleen Washington is an 63 y.o. female.   Chief Complaint  Patient presents with   Follow-up    HPI: Colleen Washington is 5 months out from robotic repair of hiatal hernia with fundoplication.  She was doing well until last week started having lower abdominal pain that was intermittent moderate intensity and went to the ER where an appropriate workup was performed including a CT scan of the abdomen pelvis that I personally reviewed showing evidence of diverticulitis.  She does have an intact wrap and no evidence of other intra-abdominal pathology.  He was started on Augmentin  and her nausea exacerbated.  She reports currently no abdominal pain she is eating well.  No fevers no chills.  No reflux SHe has been also dealing with IBS symptoms. SHe is happy with her weight and currently is at 203 pounds.  Past Medical History:  Diagnosis Date   allergic rhinitis    Anxiety    AR (allergic rhinitis)    Asthma    Bronchitis, chronic (HCC)    Depression    Dyspnea on exertion 10/22/2019   Endometriosis    GERD (gastroesophageal reflux disease)    H/O migraine    Herniated intervertebral disc    History of diverticulitis of colon    History of hiatal hernia    Hyperlipidemia    Hypertension    IBS (irritable bowel syndrome)    Irritable bowel syndrome    Menopause    Nonobstructive CAD (coronary artery disease)    a. 11/2019 Cor CTA: Cor Ca2+ = 141 (95th %'ile). LAD 25-49p.   Ovarian cyst, right 09/30/2015   PAC (premature atrial contraction) 10/22/2019   Polyp of transverse colon 05/22/2023   Psoriasis    SVT (supraventricular tachycardia) (HCC) 08/15/2018   Thyroid  nodule 10/28/2016   Tobacco abuse    Vitamin D  deficiency 05/26/2015    Past Surgical History:  Procedure Laterality Date   ABDOMINAL HYSTERECTOMY  2003   [id number]   BREAST BIOPSY Left 08/2014   benign-BENIGN BREAST TISSUE WITH FIBROSIS   COLONOSCOPY WITH PROPOFOL  N/A 05/22/2023    Procedure: COLONOSCOPY WITH PROPOFOL ;  Surgeon: Jinny Carmine, MD;  Location: [facility] END[facility]COPY;  Service: Endoscopy;  Laterality: N/A;   DILATION AND CURETTAGE OF UTERUS     ESOPHAGOGASTRODUODENOSCOPY (EGD) WITH PROPOFOL  N/A 05/22/2023   Procedure: ESOPHAGOGASTRODUODENOSCOPY (EGD) WITH PROPOFOL ;  Surgeon: Jinny Carmine, MD;  Location: [facility] ENDOSCOPY;  Service: Endoscopy;  Laterality: N/A;   INSERTION OF MESH  10/21/2023   Procedure: INSERTION OF MESH;  Surgeon: Jordis Laneta FALCON, MD;  Location: [facility] ORS;  Service: General;;   POLYPECTOMY  05/22/2023   Procedure: POLYPECTOMY;  Surgeon: Jinny Carmine, MD;  Location: [facility] ENDOSCOPY;  Service: Endoscopy;;   XI ROBOTIC ASSISTED PARAESOPHAGEAL HERNIA REPAIR N/A 10/21/2023   Procedure: REPAIR, HERNIA, PARAESOPHAGEAL, ROBOT-ASSISTED;  Surgeon: Jordis Laneta FALCON, MD;  Location: [facility] ORS;  Service: General;  Laterality: N/A;    Family History  Problem Relation Age of Onset   Arthritis Mother    Hyperlipidemia Mother    Cancer Father        colon   Diabetes Brother    Breast cancer Paternal Aunt 1   Arthritis Maternal Grandmother    Hyperlipidemia Maternal Grandfather    Heart disease Neg Hx    Colon cancer Neg Hx     Social History:  reports that she quit smoking about 2 years ago. Her smoking use included cigarettes. She started smoking about 22  years ago. She has a 5 pack-year smoking history. She has been exposed to tobacco smoke. She has never used smokeless tobacco. She reports current alcohol use. She reports that she does not use drugs.  Allergies:  Allergies  Allergen Reactions   Compazine [Prochlorperazine] Swelling    Medications reviewed.    ROS Full ROS performed and is otherwise negative other than what is stated in HPI   BP 100/69   Pulse 89   Ht 5' 7 (1.702 m)   Wt 203 lb (92.1 kg)   SpO2 98%   BMI 31.79 kg/m   Physical Exam Vitals and nursing note reviewed. Exam conducted with a chaperone present.  Constitutional:       General: She is not in acute distress.    Appearance: Normal appearance. She is normal weight. She is not ill-appearing or diaphoretic.  Pulmonary:     Effort: Pulmonary effort is normal.     Breath sounds: No stridor.  Abdominal:     General: Abdomen is flat. There is no distension.     Palpations: Abdomen is soft. There is no mass.     Tenderness: There is no abdominal tenderness. There is no guarding or rebound.     Hernia: No hernia is present.     Comments: Roboti incisions healed no hernias no infection no peritonitis  Skin:    General: Skin is warm and dry.     Capillary Refill: Capillary refill takes less than 2 seconds.  Neurological:     General: No focal deficit present.     Mental Status: She is alert and oriented to person, place, and time.     Cranial Nerves: No cranial nerve deficit.     Sensory: No sensory deficit.  Psychiatric:        Mood and Affect: Mood normal.        Behavior: Behavior normal.        Thought Content: Thought content normal.        Judgment: Judgment normal.       Assessment/Plan: Nausea likely associated to recent episode of diverticulitis and antibiotic use.  Evidence complications related to recent hiatal hernia repair.  We went ahead and gave her a prescription for Zofran .  I do anticipate that her symptoms will improve with time as a diverticulitis will improve.  No need for surgical intervention or hospitalization at this time I personally spent a total of 30 minutes in the care of the patient today including performing a medically appropriate exam/evaluation, counseling and educating, placing orders, referring and communicating with other health care professionals, documenting clinical information in the EHR, independently interpreting and reviewing images studies and coordinating care.   Laneta Luna, MD [facility] General Surgeon

## 2024-03-19 ENCOUNTER — Ambulatory Visit: Payer: Self-pay

## 2024-03-19 NOTE — Telephone Encounter (Signed)
 Copied from CRM [id number]. Topic: General - Other >> Mar 19, 2024  8:09 AM Pinkey ORN wrote: Reason for CRM: Returning Missed Office Call >> Mar 19, 2024  8:21 AM Pinkey ORN wrote: CAL stated that because Marylynn Verneita CROME, MD and Harriette Raisin, CMA isn't in the office today patient needs to be transferred to triage nurse so that she can be triaged for her Diverticulitis  >> Mar 19, 2024  8:10 AM Pinkey ORN wrote: Patient is following up on a missed office call from Harriette Raisin, CMA

## 2024-03-19 NOTE — Telephone Encounter (Signed)
 Pt triaged see telephone encounter

## 2024-03-19 NOTE — Telephone Encounter (Signed)
 FYI Only or Action Required?: FYI only for provider.  Patient was last seen in primary care on 02/19/2024 by Marylynn Verneita CROME, MD.  Called Nurse Triage reporting Diverticulitis.  Symptoms began a week ago.  Interventions attempted: Nothing.  Symptoms are: unchanged.  Triage Disposition: See Physician Within 24 - 72 Hours  Patient/caregiver understands and will follow disposition?: YesCopied from CRM #[id number]. Topic: General - Other >> Mar 19, 2024  8:09 AM Pinkey ORN wrote: Reason for CRM: Returning Missed Office Call >> Mar 19, 2024  8:21 AM Pinkey ORN wrote: CAL stated that because Marylynn Verneita CROME, MD and Harriette Raisin, CMA isn't in the office today patient needs to be transferred to triage nurse so that she can be triaged for her Diverticulitis  >> Mar 19, 2024  8:10 AM Pinkey ORN wrote: Patient is following up on a missed office call from Harriette Raisin, CMA  Reason for Disposition  [1] MODERATE pain (e.g., interferes with normal activities) AND [2] pain comes and goes (cramps) AND [3] present > 24 hours  (Exception: Pain with Vomiting or Diarrhea - see that Guideline.)  Answer Assessment - Initial Assessment Questions 1. LOCATION: Where does it hurt?      Abdomin pain  2. RADIATION: Does the pain shoot anywhere else? (e.g., chest, back)     All over abd 3. ONSET: When did the pain begin? (e.g., minutes, hours or days ago)      Last Thursday 4. SUDDEN: Gradual or sudden onset?     Suddenly with a sharp pain 5. PATTERN Does the pain come and go, or is it constant?     Was seen in ER for this, comes and goes 6. SEVERITY: How bad is the pain?  (e.g., Scale 1-10; mild, moderate, or severe)     mild 7. RECURRENT SYMPTOM: Have you ever had this type of stomach pain before? If Yes, ask: When was the last time? and What happened that time?      Yes, hx divertic 8. CAUSE: What do you think is causing the stomach pain? (e.g., gallstones, recent abdominal  surgery)     denies 9. RELIEVING/AGGRAVATING FACTORS: What makes it better or worse? (e.g., antacids, bending or twisting motion, bowel movement)     States taking augmentin  10. OTHER SYMPTOMS: Do you have any other symptoms? (e.g., back pain, diarrhea, fever, urination pain, vomiting)       nausea 11. PREGNANCY: Is there any chance you are pregnant? When was your last menstrual period?       na  Protocols used: Abdominal Pain - Female-A-AH

## 2024-03-19 NOTE — Telephone Encounter (Signed)
 Late entry...  Spoke to pt. Pt stated that she thinks her symptoms are more related to her being on antibiotic. Pt stated this morning she was feeling fine but that was sue to not taking antibiotic yet. Informed pt if symptoms become worse or develops fever over the weekend to go to the ED. Pt verbalized understanding

## 2024-03-22 ENCOUNTER — Encounter: Payer: Self-pay | Admitting: Family

## 2024-03-22 ENCOUNTER — Ambulatory Visit: Payer: Self-pay | Admitting: Family

## 2024-03-22 ENCOUNTER — Ambulatory Visit (INDEPENDENT_AMBULATORY_CARE_PROVIDER_SITE_OTHER): Admitting: Family

## 2024-03-22 VITALS — BP 116/74 | HR 103 | Temp 98.2°F | Resp 20 | Ht 67.0 in | Wt 202.5 lb

## 2024-03-22 DIAGNOSIS — Z780 Asymptomatic menopausal state: Secondary | ICD-10-CM | POA: Diagnosis not present

## 2024-03-22 DIAGNOSIS — Z122 Encounter for screening for malignant neoplasm of respiratory organs: Secondary | ICD-10-CM | POA: Diagnosis not present

## 2024-03-22 DIAGNOSIS — U071 COVID-19: Secondary | ICD-10-CM | POA: Diagnosis not present

## 2024-03-22 DIAGNOSIS — K5792 Diverticulitis of intestine, part unspecified, without perforation or abscess without bleeding: Secondary | ICD-10-CM

## 2024-03-22 DIAGNOSIS — J40 Bronchitis, not specified as acute or chronic: Secondary | ICD-10-CM

## 2024-03-22 DIAGNOSIS — R051 Acute cough: Secondary | ICD-10-CM | POA: Diagnosis not present

## 2024-03-22 LAB — POC COVID19 BINAXNOW: SARS Coronavirus 2 Ag: POSITIVE — AB

## 2024-03-22 MED ORDER — ALBUTEROL SULFATE HFA 108 (90 BASE) MCG/ACT IN AERS: 1.0000 | INHALATION_SPRAY | Freq: Four times a day (QID) | RESPIRATORY_TRACT | 2 refills | Status: AC | PRN

## 2024-03-22 MED ORDER — NIRMATRELVIR/RITONAVIR (PAXLOVID)TABLET
3.0000 | ORAL_TABLET | Freq: Two times a day (BID) | ORAL | 0 refills | Status: AC
Start: 2024-03-22 — End: 2024-03-27

## 2024-03-22 MED ORDER — HYDROCOD POLI-CHLORPHE POLI ER 10-8 MG/5ML PO SUER: 5.0000 mL | Freq: Every evening | ORAL | 0 refills | Status: AC | PRN

## 2024-03-22 NOTE — Progress Notes (Unsigned)
 Assessment & Plan:  COVID-19 Assessment & Plan: No acute respiratory distress.  Nontoxic in appearance. She has an acute COVID-19 infection with symptoms of cough, congestion, and sore throat.  We discussed her smoking history as well as immunosuppression from Skyrizi  subsequent increase risk for a severe course.  She prefers to start Paxlovid .  Start Paxlovid  and advised of drug-drug interaction with Crestor  and Wellbutrin .  Advised to completely hold Crestor .  Advised that Wellbutrin  may be less effective during the course however she may continue Wellbutrin  as prescribed.  Tussionex is prescribed for nighttime cough.  Advised on sedation on Tussionex.  She is advised to rest, hydrate, and monitor for secondary bacterial infection if symptoms persist.  She will let me know how she is doing.  Orders: -     Albuterol  Sulfate HFA; Inhale 1 puff into the lungs every 6 (six) hours as needed for wheezing or shortness of breath.  Dispense: 18 each; Refill: 2 -     Hydrocod Poli-Chlorphe Poli ER; Take 5 mLs by mouth at bedtime as needed for up to 7 days for cough.  Dispense: 35 mL; Refill: 0 -     nirmatrelvir /ritonavir ; Take 3 tablets by mouth 2 (two) times daily for 5 days. (Take nirmatrelvir  150 mg two tablets twice daily for 5 days and ritonavir  100 mg one tablet twice daily for 5 days) Patient GFR is > 60  Dispense: 30 tablet; Refill: 0  Acute cough -     POC COVID-19 BinaxNow  Bronchitis due to COVID-19 virus -     Albuterol  Sulfate HFA; Inhale 1 puff into the lungs every 6 (six) hours as needed for wheezing or shortness of breath.  Dispense: 18 each; Refill: 2  Screening for lung cancer -     Ambulatory Referral for Lung Cancer Scre  Menopause -     Ambulatory referral to Obstetrics / Gynecology  Diverticulitis Assessment & Plan: Clinically symptoms have resolved.  She has completed Augmentin .  Advised patient that she will need follow-up with gastroenterology, colonoscopy to ensure  resolution.  Orders: -     Ambulatory referral to Gastroenterology     Return precautions given.   Risks, benefits, and alternatives of the medications and treatment plan prescribed today were discussed, and patient expressed understanding.   Education regarding symptom management and diagnosis given to patient on AVS either electronically or printed.  Return if symptoms worsen or fail to improve.  Rollene Northern, FNP  Subjective:    Patient ID: Colleen Washington, female    DOB: 1960-09-05, 63 y.o.   MRN: [id number]  CC: Colleen Washington is a 63 y.o. female who presents today for an acute visit.    HPI: HPI Discussed the use of AI scribe software for clinical note transcription with the patient, who gave verbal consent to proceed.  History of Present Illness   Colleen Washington is a 63 year old female who presents with a cough.  She has been experiencing a cough for the past three days, accompanied by congestion and fatigue. Her throat feels raw and burning. She had chills on Saturday but currently has no chills, fever, or diarrhea, wheezing, sob. She has been using Advil Cold & Sinus and an albuterol  inhaler, which have provided some relief, and Tussinex at night for cough relief.  She recently completed a course of Augmentin  for diverticulitis.  Abdominal pain is completely resolved.  She denies current diarrhea or fever.  History of prediabetes, A1c of 6.1%.  She is mindful of her diet, avoiding sugary drinks and monitoring carbohydrate intake.  She has a history of smoking. She uses an albuterol  inhaler as needed for cough and has a history of using Advair in the past, though she is not currently on Advair.    LLQ abdominal pain has resolved.   Former smoker, quit 2023.  Candidate for lung cancer screening.  H/o Covid and treated with antiviral.  H/o SVT, PAD,CAD  Follows with dermatology for annual skin exam and psoriasis  She would like referral to establish care with  GYN.   Presented to the emergency room 03/11/2024 for abdominal pain  Started on augmentin  CT a/p  IMPRESSION: 1. Acute diverticulitis of the proximal sigmoid colon. No perforation or abscess. 2. Normal appendix. 3. Normal gallbladder. Colonoscopy 05/22/2023; advised to repeat in 5 years. Allergies: Compazine [prochlorperazine], Prochlorperazine edisylate, and Prochlorperazine maleate Current Outpatient Medications on File Prior to Visit  Medication Sig Dispense Refill   aspirin  EC 81 MG tablet Take 1 tablet (81 mg total) by mouth daily. Swallow whole.     buPROPion  (WELLBUTRIN  XL) 150 MG 24 hr tablet TAKE 1 TABLET BY MOUTH EVERY DAY 90 tablet 1   cetirizine  (ZYRTEC  ALLERGY) 10 MG tablet Take 1 tablet (10 mg total) by mouth daily. 90 tablet 1   fluticasone  (FLONASE ) 50 MCG/ACT nasal spray Place 2 sprays into both nostrils daily. 16 g 6   Multiple Vitamins-Minerals (ALIVE WOMENS 50+ GUMMY PO) Take 2 each by mouth daily.     ondansetron  (ZOFRAN ) 4 MG tablet Take 1 tablet (4 mg total) by mouth every 8 (eight) hours as needed for nausea or vomiting. 30 tablet 1   ondansetron  (ZOFRAN -ODT) 4 MG disintegrating tablet Take 1 tablet (4 mg total) by mouth every 8 (eight) hours as needed for nausea or vomiting. 20 tablet 0   promethazine  (PHENERGAN ) 12.5 MG tablet TAKE 1 TABLET (12.5 MG TOTAL) BY MOUTH EVERY 4 HOURS AS NEEDED FOR NAUSEA AND VOMITING 30 tablet 0   risankizumab -rzaa (SKYRIZI  PEN) 150 MG/ML pen Inject 1 mL (150 mg total) into the skin as directed. 1 sq injection q 12 weeks for maintenance 1 mL 2   rosuvastatin  (CRESTOR ) 5 MG tablet Take 1 tablet (5 mg total) by mouth daily. 90 tablet 3   Vitamin D -Vitamin K (VITAMIN K2-VITAMIN D3 PO) Take 1 tablet by mouth daily.     No current facility-administered medications on file prior to visit.    Review of Systems  Constitutional:  Negative for chills (resolved) and fever.  HENT:  Positive for congestion.   Respiratory:  Positive for cough.  Negative for shortness of breath and wheezing.   Cardiovascular:  Negative for chest pain and palpitations.  Gastrointestinal:  Negative for abdominal pain (resolved), nausea and vomiting.  Neurological:  Negative for dizziness and headaches.      Objective:    BP 116/74   Pulse (!) 101   Temp 98.2 F (36.8 C)   Resp 20   Ht 5' 7 (1.702 m)   Wt 202 lb 8 oz (91.9 kg)   SpO2 98%   BMI 31.72 kg/m   BP Readings from Last 3 Encounters:  03/22/24 116/74  03/17/24 100/69  03/11/24 (!) 116/90   Wt Readings from Last 3 Encounters:  03/22/24 202 lb 8 oz (91.9 kg)  03/17/24 203 lb (92.1 kg)  03/11/24 204 lb 12.9 oz (92.9 kg)    Physical Exam Vitals reviewed.  Constitutional:      Appearance:  Normal appearance. She is well-developed.  HENT:     Head: Normocephalic and atraumatic.     Right Ear: Hearing, tympanic membrane, ear canal and external ear normal. No decreased hearing noted. No drainage, swelling or tenderness. No middle ear effusion. No foreign body. Tympanic membrane is not erythematous or bulging.     Left Ear: Hearing, tympanic membrane, ear canal and external ear normal. No decreased hearing noted. No drainage, swelling or tenderness.  No middle ear effusion. No foreign body. Tympanic membrane is not erythematous or bulging.     Nose: Nose normal. No rhinorrhea.     Right Sinus: No maxillary sinus tenderness or frontal sinus tenderness.     Left Sinus: No maxillary sinus tenderness or frontal sinus tenderness.     Mouth/Throat:     Pharynx: Uvula midline. No oropharyngeal exudate or posterior oropharyngeal erythema.     Tonsils: No tonsillar abscesses.  Eyes:     Conjunctiva/sclera: Conjunctivae normal.  Cardiovascular:     Rate and Rhythm: Normal rate and regular rhythm.     Pulses: Normal pulses.     Heart sounds: Normal heart sounds.  Pulmonary:     Effort: Pulmonary effort is normal.     Breath sounds: Normal breath sounds. No wheezing, rhonchi or rales.   Abdominal:     General: Bowel sounds are normal. There is no distension.     Palpations: Abdomen is soft. Abdomen is not rigid. There is no fluid wave or mass.     Tenderness: There is no abdominal tenderness. There is no guarding or rebound.  Lymphadenopathy:     Head:     Right side of head: No submental, submandibular, tonsillar, preauricular, posterior auricular or occipital adenopathy.     Left side of head: No submental, submandibular, tonsillar, preauricular, posterior auricular or occipital adenopathy.     Cervical: No cervical adenopathy.  Skin:    General: Skin is warm and dry.  Neurological:     Mental Status: She is alert.  Psychiatric:        Speech: Speech normal.        Behavior: Behavior normal.        Thought Content: Thought content normal.

## 2024-03-22 NOTE — Telephone Encounter (Signed)
 Seen today; dx covid

## 2024-03-22 NOTE — Patient Instructions (Addendum)
 I placed a referral to [facility] pulmonology  for annual lung cancer screening program.  You should receive a phone call from the office to discuss the test and to qualify you for the program.  If you do not receive a phone call from their office in the next 2 weeks, please [facility] at : [phone]  If you have any issues in scheduling or speaking with pulmonology, please let me know.  Referral to GI and also GYN  Let us  know if you dont hear back within 2 weeks in regards to an appointment being scheduled.   So that you are aware, if you are [facility] MyChart user , please pay attention to your MyChart messages as you may receive a MyChart message with a phone number to call and schedule this test/appointment own your own from our referral coordinator. This is a new process so I do not want you to miss this message.  If you are not a MyChart user, you will receive a phone call.   HOLD TAKING CRESTOR  WHILE ON PAXLOVID .  Nirmatrelvir  and Ritonavir  may decrease serum concentrations of BuPROPion .   You no longer have to quarantine per cdc guidelines as long as you are fever free without antipyretic (Tylenol  or ibuprofen) for 24 hours.    We discussed starting Paxlovid  .  There are benefits and risks of taking this treatment as outlined in the "Fact Sheet for Patients and Caregivers." You may find this document here and please read in detail   [email or web address].[email or web address]   I have sent Paxlovid  to your pharmacy. Please call pharmacy so they bring medication out to your car and you do not have to go inside.   PAXLOVID  ADMINISTRATION INSTRUCTIONS:  Take with or without food. Swallow the tablets whole. Don't chew, crush, or break the medications because it might not work as well  For each dose of the medication, you should be taking 3 tablets together (2 pink oval and 1 white oval) TWICE a day for FIVE days   Finish your full five-day course of Paxlovid  even if you feel  better before you're done. Stopping this medication too early can make it less effective to prevent severe illness related to COVID19.    Paxlovid  is prescribed for YOU ONLY. Don't share it with others, even if they have similar symptoms as you. This medication might not be right for everyone.  Make sure to take steps to protect yourself and others while you're taking this medication in order to get well soon and to prevent others from getting sick with COVID-19.  Paxlovid  (nirmatrelvir  / ritonavir ) can cause hormonal birth control medications to not work well. If you or your partner is currently taking hormonal birth control, use condoms or other birth control methods to prevent unintended pregnancies.   COMMON SIDE EFFECTS: Altered or bad taste in your mouth  Diarrhea  High blood pressure (1% of people) Muscle aches (1% of people)    If your COVID-19 symptoms get worse, get medical help right away. Call 911 if you experience symptoms such as worsening cough, trouble breathing, chest pain that doesn't go away, confusion, a hard time staying awake, and pale or blue-colored skin.This medication won't prevent all COVID-19 cases from getting worse.

## 2024-03-24 ENCOUNTER — Encounter: Payer: Self-pay | Admitting: Family

## 2024-03-25 NOTE — Assessment & Plan Note (Signed)
 No acute respiratory distress.  Nontoxic in appearance. She has an acute COVID-19 infection with symptoms of cough, congestion, and sore throat.  We discussed her smoking history as well as immunosuppression from Skyrizi  subsequent increase risk for a severe course.  She prefers to start Paxlovid .  Start Paxlovid  and advised of drug-drug interaction with Crestor  and Wellbutrin .  Advised to completely hold Crestor .  Advised that Wellbutrin  may be less effective during the course however she may continue Wellbutrin  as prescribed.  Tussionex is prescribed for nighttime cough.  Advised on sedation on Tussionex.  She is advised to rest, hydrate, and monitor for secondary bacterial infection if symptoms persist.  She will let me know how she is doing.

## 2024-03-25 NOTE — Assessment & Plan Note (Signed)
 Clinically symptoms have resolved.  She has completed Augmentin .  Advised patient that she will need follow-up with gastroenterology, colonoscopy to ensure resolution.

## 2024-03-26 ENCOUNTER — Encounter: Payer: Self-pay | Admitting: Internal Medicine

## 2024-03-26 ENCOUNTER — Telehealth: Payer: Self-pay | Admitting: Internal Medicine

## 2024-03-26 NOTE — Telephone Encounter (Signed)
 Pt tested positive for covid 4 days ago. Pt would like to go back to work on Monday 03/29/2024 but needs a return to work letter. Is it okay for pt to return on Monday if her symptoms are getting better and she has been fever free for 48 hours with no medication? If so I will type up the letter.

## 2024-03-26 NOTE — Telephone Encounter (Signed)
 Copied from CRM [id number]. Topic: General - Call Back - No Documentation >> Mar 26, 2024 11:46 AM Rea BROCKS wrote: Reason for CRM: Patient is calling to see if Dr.Tullo can write a go back to work letter for her to return on Monday 03/29/24. She is asking if the letter can be sent to her email.    [phone] (M)

## 2024-03-26 NOTE — Telephone Encounter (Signed)
 Pt is aware and stated that she will print it off her mychart.

## 2024-03-30 ENCOUNTER — Ambulatory Visit: Admitting: Internal Medicine

## 2024-03-30 ENCOUNTER — Inpatient Hospital Stay: Admission: RE | Admit: 2024-03-30 | Source: Ambulatory Visit

## 2024-03-30 NOTE — Telephone Encounter (Signed)
 Call pt FMLA completed

## 2024-03-30 NOTE — Telephone Encounter (Signed)
 Yes we can looks like Daijah missed routing .

## 2024-03-30 NOTE — Telephone Encounter (Signed)
 [name], I didn't receive this mychart message re patient concern. Can we discuss?

## 2024-03-30 NOTE — Telephone Encounter (Signed)
Paperwork has been faxed and sent to scan

## 2024-04-06 DIAGNOSIS — M9905 Segmental and somatic dysfunction of pelvic region: Secondary | ICD-10-CM | POA: Diagnosis not present

## 2024-04-06 DIAGNOSIS — M9903 Segmental and somatic dysfunction of lumbar region: Secondary | ICD-10-CM | POA: Diagnosis not present

## 2024-04-06 DIAGNOSIS — M955 Acquired deformity of pelvis: Secondary | ICD-10-CM | POA: Diagnosis not present

## 2024-04-06 DIAGNOSIS — M62830 Muscle spasm of back: Secondary | ICD-10-CM | POA: Diagnosis not present

## 2024-04-09 ENCOUNTER — Ambulatory Visit: Payer: Self-pay

## 2024-04-09 ENCOUNTER — Encounter: Payer: Self-pay | Admitting: Family Medicine

## 2024-04-09 ENCOUNTER — Ambulatory Visit (INDEPENDENT_AMBULATORY_CARE_PROVIDER_SITE_OTHER): Admitting: Family Medicine

## 2024-04-09 VITALS — BP 116/68 | HR 92 | Temp 98.0°F | Ht 67.0 in | Wt 206.1 lb

## 2024-04-09 DIAGNOSIS — M5432 Sciatica, left side: Secondary | ICD-10-CM | POA: Diagnosis not present

## 2024-04-09 MED ORDER — PREDNISONE 10 MG PO TABS: ORAL_TABLET | ORAL | 0 refills | Status: DC

## 2024-04-09 MED ORDER — METHOCARBAMOL 500 MG PO TABS: 500.0000 mg | ORAL_TABLET | Freq: Three times a day (TID) | ORAL | 0 refills | Status: DC | PRN

## 2024-04-09 NOTE — Assessment & Plan Note (Addendum)
 Acute on chronic (worsened)  History of disk dz in past  Sees chirpractor and gets massage  Today pain is left sided from SI area to foot  Reassuring exam/no neuro deficits Disc symptomatic care - see instructions on AVS   Prednisone  40 mg taper for inflammation (discussed side effects) Methocarbamol  for spasm  Update if not starting to improve in a week or if worsening  Consider PT in future   Call back and Er precautions noted in detail today

## 2024-04-09 NOTE — Patient Instructions (Signed)
 For sciatic pain  Take prednisone  as directed (it may make you feel hyper and hungry)  Try the muscle relaxer as needed (methocarbamol ) with caution of sedation   Let Dr Marylynn know how you do  May want to consider physical therapy in the future   Do any stretches that help  Keep walking   If symptoms suddenly worsen go to the ER

## 2024-04-09 NOTE — Telephone Encounter (Signed)
 FYI Only or Action Required?: FYI only for provider.  Patient was last seen in primary care on 03/22/2024 by Dineen Rollene MATSU, FNP.  Called Nurse Triage reporting Leg Pain.  Symptoms began a week ago.  Interventions attempted:  pain medications.  Symptoms are: unchanged.  Triage Disposition: See PCP When Office is Open (Within 3 Days)  Patient/caregiver understands and will follow disposition?: Yes                   Copied from CRM [id number]. Topic: Clinical - Red Word Triage >> Apr 09, 2024  8:05 AM Carlyon D wrote: Red Word that prompted transfer to Nurse Triage: pain all the way down left leg. Pain from hip to bottom of the foot believes its her sciatic nerve Reason for Disposition  [1] MODERATE pain (e.g., interferes with normal activities, limping) AND [2] present > 3 days  Answer Assessment - Initial Assessment Questions 1. ONSET: When did the pain start?      1 week 2. LOCATION: Where is the pain located?      Left leg from hip to foot 3. PAIN: How bad is the pain?    (Scale 1-10; or mild, moderate, severe)     Throbbing - 5/10 4. WORK OR EXERCISE: Has there been any recent work or exercise that involved this part of the body?      work 5. CAUSE: What do you think is causing the leg pain?     Sciatic pain 6. OTHER SYMPTOMS: Do you have any other symptoms? (e.g., chest pain, back pain, breathing difficulty, swelling, rash, fever, numbness, weakness)     no  Protocols used: Leg Pain-A-AH

## 2024-04-09 NOTE — Progress Notes (Signed)
 Subjective:    Patient ID: Colleen [facility], female    DOB: [birth year], 63 y.o.   MRN: [id number]  HPI  Wt Readings from Last 3 Encounters:  04/09/24 206 lb 2 oz (93.5 kg)  03/22/24 202 lb 8 oz (91.9 kg)  03/17/24 203 lb (92.1 kg)   32.28 kg/m  Vitals:   04/09/24 1557  BP: 116/68  Pulse: 92  Temp: 98 F (36.7 C)  SpO2: 97%     63 yo pt of Dr Marylynn presents for c/o Left leg pain    Thinks this is sciatic pain  Today worse in the lower leg - radiates to bottom of foot  Dull like a tooth ache , it throbs  Up as far at the buttock  No groin or hip pain   Can be bad at night -putting leg over a pillow helps a bit  Has to get up and move around   Walking relieves it a bit   No numbness or tingling  No loss of b/b control  No foot drop or weakness      Has had sciatica for over 10 y  Goes to chiropractor  Also massage monthly   Problem list notes past herniated disk  Per pt never did formal PT  No MRI in epic     Used thera gun massage Icy hot  Volteren gel  Salon pas Tylenol  - bothers stomach   No oral nsaid    Had diverticulitis recently  Finished antibiotic  Still some nausea-takes zofran     LS xray 02/2014 Narrative  CLINICAL DATA:  Left side lumbosacral pain  EXAM: LUMBAR SPINE - COMPLETE 4+ VIEW  COMPARISON:  None.  FINDINGS: Five views of the lumbar spine submitted. No acute fracture or subluxation. Alignment and vertebral heights are preserved. Mild disc space flattening at L5-S1 level.  IMPRESSION: No acute fracture or subluxation. Mild disc space flattening at L5-S1 level.     Patient Active Problem List   Diagnosis Date Noted   Sciatic pain, left 04/09/2024   COVID-19 03/22/2024   Diverticulitis 02/19/2024   S/P repair of paraesophageal hernia 10/21/2023   Polyp of transverse colon 05/22/2023   Gastroesophageal reflux disease 05/22/2023   Colon cancer screening 05/22/2023   Caregiver stress 11/25/2022   Grief reaction  10/07/2022   Hiatal hernia 12/18/2021   Coronary artery disease involving native coronary artery of native heart without angina pectoris 05/18/2021   Leg edema 05/18/2021   Right arm pain 10/03/2020   History of COVID-19 07/28/2020   Chest pain of uncertain etiology 10/22/2019   Dyspnea on exertion 10/22/2019   Palpitations 10/22/2019   PAC (premature atrial contraction) 10/22/2019   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   SVT (supraventricular tachycardia) 08/15/2018   Thyroid  nodule 10/28/2016   Ovarian cyst, right 09/30/2015   Endometriosis 08/10/2015   Status post vaginal hysterectomy 08/10/2015   Anxiety 08/10/2015   Obesity (BMI 30.0-34.9) 08/10/2015   Vitamin D  deficiency 05/26/2015   Weight gain 02/22/2015   Menopause 09/02/2014   Visit for preventive health examination 08/24/2013   Snoring 08/24/2013   Symptoms, such as flushing, sleeplessness, headache, lack of concentration, associated with the menopause 08/24/2013   Encounter for drug screening 08/13/2013   Migraine headache 08/26/2012   Panic disorder without agoraphobia with mild panic attacks 08/26/2012   Bronchitis, chronic (HCC) 05/09/2012   History of diverticulitis of colon    Reflux esophagitis    Hyperlipidemia    Irritable bowel syndrome  Herniated intervertebral disc    Tobacco use 03/04/2012   Past Medical History:  Diagnosis Date   allergic rhinitis    Anxiety    AR (allergic rhinitis)    Asthma    Bronchitis, chronic (HCC)    Depression    Dyspnea on exertion 10/22/2019   Endometriosis    GERD (gastroesophageal reflux disease)    H/O migraine    Herniated intervertebral disc    History of diverticulitis of colon    History of hiatal hernia    Hyperlipidemia    Hypertension    IBS (irritable bowel syndrome)    Irritable bowel syndrome    Menopause    Nonobstructive CAD (coronary artery disease)    a. 11/2019 Cor CTA: Cor Ca2+ = 141 (95th %'ile). LAD 25-49p.   Ovarian cyst, right  09/30/2015   PAC (premature atrial contraction) 10/22/2019   Polyp of transverse colon 05/22/2023   Psoriasis    SVT (supraventricular tachycardia) 08/15/2018   Thyroid  nodule 10/28/2016   Tobacco abuse    Vitamin D  deficiency 05/26/2015   Past Surgical History:  Procedure Laterality Date   ABDOMINAL HYSTERECTOMY  2003   tvhuso   BREAST BIOPSY Left 08/2014   benign-BENIGN BREAST TISSUE WITH FIBROSIS   COLONOSCOPY WITH PROPOFOL  N/A 05/22/2023   Procedure: COLONOSCOPY WITH PROPOFOL ;  Surgeon: Jinny Carmine, MD;  Location: [facility];  Service: Endoscopy;  Laterality: N/A;   DILATION AND CURETTAGE OF UTERUS     ESOPHAGOGASTRODUODENOSCOPY (EGD) WITH PROPOFOL  N/A 05/22/2023   Procedure: ESOPHAGOGASTRODUODENOSCOPY (EGD) WITH PROPOFOL ;  Surgeon: Jinny Carmine, MD;  Location: [facility];  Service: Endoscopy;  Laterality: N/A;   INSERTION OF MESH  10/21/2023   Procedure: INSERTION OF MESH;  Surgeon: Jordis Laneta FALCON, MD;  Location: [facility];  Service: General;;   POLYPECTOMY  05/22/2023   Procedure: POLYPECTOMY;  Surgeon: Jinny Carmine, MD;  Location: [facility];  Service: Endoscopy;;   XI ROBOTIC ASSISTED PARAESOPHAGEAL HERNIA REPAIR N/A 10/21/2023   Procedure: REPAIR, HERNIA, PARAESOPHAGEAL, ROBOT-ASSISTED;  Surgeon: Jordis Laneta FALCON, MD;  Location: [facility];  Service: General;  Laterality: N/A;   Social History   Tobacco Use   Smoking status: Former    Current packs/day: 0.00    Average packs/day: 0.3 packs/day for 20.0 years (5.0 ttl pk-yrs)    Types: Cigarettes    Start date: 11/14/2001    Quit date: 11/14/2021    Years since quitting: 2.4    Passive exposure: Past   Smokeless tobacco: Never   Tobacco comments:       Vaping Use   Vaping status: Never Used  Substance Use Topics   Alcohol use: Yes    Alcohol/week: 0.0 standard drinks of alcohol    Comment: occas   Drug use: No   Family History  Problem Relation Age of Onset   Arthritis Mother    Hyperlipidemia Mother    Cancer  Father        colon   Diabetes Brother    Breast cancer Paternal Aunt 30   Arthritis Maternal Grandmother    Hyperlipidemia Maternal Grandfather    Heart disease Neg Hx    Colon cancer Neg Hx    Allergies  Allergen Reactions   Compazine [Prochlorperazine] Swelling   Prochlorperazine Edisylate    Prochlorperazine Maleate    Current Outpatient Medications on File Prior to Visit  Medication Sig Dispense Refill   albuterol  (VENTOLIN  HFA) 108 (90 Base) MCG/ACT inhaler Inhale 1 puff into the lungs every 6 (six)  hours as needed for wheezing or shortness of breath. 18 each 2   aspirin  EC 81 MG tablet Take 1 tablet (81 mg total) by mouth daily. Swallow whole.     buPROPion  (WELLBUTRIN  XL) 150 MG 24 hr tablet TAKE 1 TABLET BY MOUTH EVERY DAY 90 tablet 1   cetirizine  (ZYRTEC  ALLERGY) 10 MG tablet Take 1 tablet (10 mg total) by mouth daily. 90 tablet 1   fluticasone  (FLONASE ) 50 MCG/ACT nasal spray Place 2 sprays into both nostrils daily. 16 g 6   Multiple Vitamins-Minerals (ALIVE WOMENS 50+ GUMMY PO) Take 2 each by mouth daily.     ondansetron  (ZOFRAN ) 4 MG tablet Take 1 tablet (4 mg total) by mouth every 8 (eight) hours as needed for nausea or vomiting. 30 tablet 1   ondansetron  (ZOFRAN -ODT) 4 MG disintegrating tablet Take 1 tablet (4 mg total) by mouth every 8 (eight) hours as needed for nausea or vomiting. 20 tablet 0   promethazine  (PHENERGAN ) 12.5 MG tablet TAKE 1 TABLET (12.5 MG TOTAL) BY MOUTH EVERY 4 HOURS AS NEEDED FOR NAUSEA AND VOMITING 30 tablet 0   risankizumab -rzaa (SKYRIZI  PEN) 150 MG/ML pen Inject 1 mL (150 mg total) into the skin as directed. 1 sq injection q 12 weeks for maintenance 1 mL 2   rosuvastatin  (CRESTOR ) 5 MG tablet Take 1 tablet (5 mg total) by mouth daily. 90 tablet 3   Vitamin D -Vitamin K (VITAMIN K2-VITAMIN D3 PO) Take 1 tablet by mouth daily.     No current facility-administered medications on file prior to visit.    Review of Systems  Constitutional:  Negative  for activity change, appetite change, fatigue, fever and unexpected weight change.  HENT:  Negative for congestion, ear pain, rhinorrhea, sinus pressure and sore throat.   Eyes:  Negative for pain, redness and visual disturbance.  Respiratory:  Negative for cough, shortness of breath and wheezing.   Cardiovascular:  Negative for chest pain and palpitations.  Gastrointestinal:  Negative for abdominal pain, blood in stool, constipation and diarrhea.  Endocrine: Negative for polydipsia and polyuria.  Genitourinary:  Negative for dysuria, frequency and urgency.  Musculoskeletal:  Positive for back pain. Negative for arthralgias and myalgias.       Left lower leg pain / left buttock pain   Skin:  Negative for pallor and rash.  Allergic/Immunologic: Negative for environmental allergies.  Neurological:  Negative for dizziness, syncope and headaches.  Hematological:  Negative for adenopathy. Does not bruise/bleed easily.  Psychiatric/Behavioral:  Negative for decreased concentration and dysphoric mood. The patient is not nervous/anxious.        Objective:   Physical Exam Constitutional:      Appearance: Normal appearance.  Eyes:     Conjunctiva/sclera: Conjunctivae normal.     Pupils: Pupils are equal, round, and reactive to light.  Cardiovascular:     Rate and Rhythm: Normal rate and regular rhythm.  Pulmonary:     Effort: Pulmonary effort is normal. No respiratory distress.  Musculoskeletal:     Cervical back: Neck supple.     Lumbar back: Spasms and tenderness present. No swelling, deformity or bony tenderness. Normal range of motion. Positive left straight leg raise test. Negative right straight leg raise test. No scoliosis.     Right lower leg: No edema.     Left lower leg: No edema.     Comments: Tender over left SI joint area and lateral thigh  Positive left SLR causing lower leg pain  Normal rom hip  Normal rom of spine-some pain on full flexion  Some loss of lordosis and spasm    Skin:    Findings: No bruising, erythema or rash.  Neurological:     Mental Status: She is alert.     Sensory: No sensory deficit.     Motor: No weakness.     Deep Tendon Reflexes: Reflexes normal.  Psychiatric:        Mood and Affect: Mood normal.           Assessment & Plan:   Problem List Items Addressed This Visit       Nervous and Auditory   Sciatic pain, left - Primary   Acute on chronic  History of disk dz in past  Sees chirpractor and gets massage  Today pain is left sided from SI area to foot  Reassuring exam/no neuro deficits Disc symptomatic care - see instructions on AVS   Prednisone  40 mg taper for inflammation (discussed side effects) Methocarbamol  for spasm  Update if not starting to improve in a week or if worsening  Consider PT in future   Call back and Er precautions noted in detail today         Relevant Medications   methocarbamol  (ROBAXIN ) 500 MG tablet

## 2024-04-09 NOTE — Telephone Encounter (Signed)
 Pt is scheduled to see Dr. Randeen today.

## 2024-04-14 ENCOUNTER — Encounter: Payer: Self-pay | Admitting: Dietician

## 2024-04-14 ENCOUNTER — Encounter: Attending: Internal Medicine | Admitting: Dietician

## 2024-04-14 DIAGNOSIS — E639 Nutritional deficiency, unspecified: Secondary | ICD-10-CM

## 2024-04-14 DIAGNOSIS — K5792 Diverticulitis of intestine, part unspecified, without perforation or abscess without bleeding: Secondary | ICD-10-CM

## 2024-04-14 NOTE — Progress Notes (Signed)
 Medical Nutrition Therapy  Appointment Start time:  5  Appointment End time:  1145 Employee Wellness Visit 1 of 3 EID#: [id number]  Primary concerns today: Diverticulitis  Referral diagnosis: N/A Preferred learning style: No preference indicated Learning readiness: Ready   NUTRITION ASSESSMENT    Clinical Medical Hx: CAD, HLD, GERD, IBS, Diverticulitis, Vit D deficiency Medications: Omeprazole , Rosuvastatin , Prednisone , Promethazine  Labs (03/11/2024): Glucose - 167 mg/dL, (07/16/7972): J8r - 3.8% Notable Signs/Symptoms: LLQ discomfort   Lifestyle & Dietary Hx Pt reports recent bout of diverticulitis, hx of IBS/diverticulosis since their 40s, states this was their first bout of diverticulitis. Pt reports intermittent bouts of constipation/diarrhea since ED, wanting to make dietary changes to improve symptoms. Pt reports hx of GERD managed with PPIs, had hiatal hernia surgery in March, 2025, states they had relief from GERD after that has since returned. Pt reports liking vegetables, will mostly get these foods from [employer] or [employer], states they they eat mostly away from home.  Pt reports drinking ginger ale consistently r/t indigestion, inconsistent water intake.    Estimated daily fluid intake: 48-64 oz Supplements: MVI Sleep: Moderately well Stress / self-care: 5/10 Current average weekly physical activity: ADLs, no exercise   24-Hr Dietary Recall First Meal: Danon peach yogurt Snack:  Second Meal: 1/2 chicken salad sandwich, Fritos Snack:  Third Meal: Sesame chicken w/ fried rice Snack:  Beverages: Ginger ale, water   NUTRITION DIAGNOSIS  [place]-1.4 Altered GI function As related to diverticulitis.  As evidenced by recent ED admission r/t LLQ pain, intermittent constipation/diarrhea.   NUTRITION INTERVENTION  Nutrition education (E-1) on the following topics:  Educated patient on pathophysiology of diverticulitis. Educated patient on nutritional considerations to  prevent flare ups. Educated patient on sources of dietary fiber and proper progression of fiber intake. Educated patient on potential sources of irritants like raw fruit/vegetable peel/skin, seeds, nuts, whole grains.  Handouts Provided Include  Fiber Content of Foods Fiber Restricted Nutrition Therapy  Learning Style & Readiness for Change Teaching method utilized: Visual & Auditory  Demonstrated degree of understanding via: Teach Back  Barriers to learning/adherence to lifestyle change: None  Goals Established by Pt Look into different Steam-In-Bag vegetables for a quick easy way to get some extra fiber! Avoid or de-seed foods containing seeds or husks that can inflame the diverticulum like strawberries, blackberries, raspberries, watermelon, nuts, seeds, cucumbers, tomatoes, zucchini, squash, corn, popcorn. Avoid High-Fat/Fried foods, including added fats from butter, dressings, sauces, cream soups. Choose sugar-free beverages as often as possible, and try to drink 64 oz of plain water daily. Look for protein shakes low in sugar alcohols and inulin (chicory root fiber) like Premiere Protein, Pure Protein, or Atkins shakes.   MONITORING & EVALUATION Dietary intake, weekly physical activity, and GI symptoms in 6 weeks.  Next Steps  Patient is to slowly increase fiber intake, follow up for employee wellness visit 2 of 3.

## 2024-04-14 NOTE — Patient Instructions (Addendum)
 Look into different Steam-In-Bag vegetables for a quick easy way to get some extra fiber!  Avoid or de-seed foods containing seeds or husks that can inflame the diverticulum like strawberries, blackberries, raspberries, watermelon, nuts, seeds, cucumbers, tomatoes, zucchini, squash, corn, popcorn.  Avoid High-Fat/Fried foods, including added fats from butter, dressings, sauces, cream soups.  Choose sugar-free beverages as often as possible, and try to drink 64 oz of plain water daily.  Look for protein shakes low in sugar alcohols and inulin (chicory root fiber) like Premiere Protein, Pure Protein, or Atkins shakes.

## 2024-04-20 DIAGNOSIS — M9905 Segmental and somatic dysfunction of pelvic region: Secondary | ICD-10-CM | POA: Diagnosis not present

## 2024-04-20 DIAGNOSIS — M955 Acquired deformity of pelvis: Secondary | ICD-10-CM | POA: Diagnosis not present

## 2024-04-20 DIAGNOSIS — M9903 Segmental and somatic dysfunction of lumbar region: Secondary | ICD-10-CM | POA: Diagnosis not present

## 2024-04-20 DIAGNOSIS — M62830 Muscle spasm of back: Secondary | ICD-10-CM | POA: Diagnosis not present

## 2024-05-18 DIAGNOSIS — M955 Acquired deformity of pelvis: Secondary | ICD-10-CM | POA: Diagnosis not present

## 2024-05-18 DIAGNOSIS — M9905 Segmental and somatic dysfunction of pelvic region: Secondary | ICD-10-CM | POA: Diagnosis not present

## 2024-05-18 DIAGNOSIS — M62830 Muscle spasm of back: Secondary | ICD-10-CM | POA: Diagnosis not present

## 2024-05-18 DIAGNOSIS — M9903 Segmental and somatic dysfunction of lumbar region: Secondary | ICD-10-CM | POA: Diagnosis not present

## 2024-05-19 ENCOUNTER — Telehealth: Payer: Self-pay | Admitting: Internal Medicine

## 2024-05-19 ENCOUNTER — Ambulatory Visit
Admission: RE | Admit: 2024-05-19 | Discharge: 2024-05-19 | Disposition: A | Discharge status: Home / Self Care | Source: Ambulatory Visit | Attending: Internal Medicine | Admitting: Internal Medicine

## 2024-05-19 DIAGNOSIS — Z1231 Encounter for screening mammogram for malignant neoplasm of breast: Secondary | ICD-10-CM | POA: Insufficient documentation

## 2024-05-19 NOTE — Telephone Encounter (Signed)
 Copied from CRM [id number]. Topic: Referral - Question >> May 19, 2024 12:55 PM Frederich PARAS wrote: Reason for CRM: pt would like to know if the referral done on 02/19/24 for attention specialist in [place] can be changed to somone in [place].

## 2024-05-20 NOTE — Telephone Encounter (Signed)
 Spoke to pt gave her number and address to [facility] pt stated that she will go there

## 2024-05-24 ENCOUNTER — Other Ambulatory Visit: Payer: Self-pay | Admitting: Dermatology

## 2024-05-25 ENCOUNTER — Encounter: Attending: Internal Medicine | Admitting: Dietician

## 2024-05-25 ENCOUNTER — Encounter: Payer: Self-pay | Admitting: Dietician

## 2024-05-25 DIAGNOSIS — E639 Nutritional deficiency, unspecified: Secondary | ICD-10-CM | POA: Insufficient documentation

## 2024-05-25 NOTE — Patient Instructions (Addendum)
 Weigh yourself once weekly in the morning, look for weight loss of 1-2 pounds per weight.  If using a protein powder, try a Whey Isolate powder. Add half a banana if you like, and try PB2 powdered peanut butter for extra protein/fiber and great flavor.  Look into the [employer] Switch for an option to increase your activity at home in the evenings.

## 2024-05-25 NOTE — Progress Notes (Signed)
 Medical Nutrition Therapy  Appointment Start time:  1615  Appointment End time:  1645 Employee Wellness Visit 2 of 3 EID#: [id number]  Primary concerns today: Diverticulitis  Referral diagnosis: N/A Preferred learning style: No preference indicated Learning readiness: Ready   NUTRITION ASSESSMENT    Clinical Medical Hx: CAD, HLD, GERD, IBS, Diverticulitis, Vit D deficiency, Psoriasis Medications: Omeprazole , Rosuvastatin , Prednisone , Promethazine , Skyrizi  Labs (03/11/2024): Glucose - 167 mg/dL, (07/16/7972): J8r - 3.8% Notable Signs/Symptoms: LLQ discomfort   Lifestyle & Dietary Hx Pt reports having upcoming visit with gastroenterologist in 2 weeks for evaluation. Pt reports early satiety over the last 2 weeks, lost 3 pounds in the last week, trying to get to 180 lbs. Pt reports less indigestion since last visit, drinking less gingerale and taking less Omeprazole  recently. Pt reports trying to avoid fried foods as often as possible, states ground beef is well tolerated. Pt reports drinking more water in the evening. Pt reports rarely eating breakfast, has been trying protein shakes, states Atkins shakes did not sit well, Nurri was well tolerated. Pt reports lowered motivation to be active as the days get shorter and colder, looking into getting a new treadmill.    Estimated daily fluid intake: 48-64 oz Supplements: MVI Sleep: Moderately well Stress / self-care: 5/10 Current average weekly physical activity: ADLs, no exercise   24-Hr Dietary Recall First Meal: No breakfast  Snack:  Second Meal: 4 baked wings w/ buffalo sauce, small portion of potato salad, carrots, celery Snack:  Third Meal: Hamburger soup (beef, peas, green beans, diced tomatoes) Snack:  Beverages: Water, Diet Dr. Nunzio   NUTRITION DIAGNOSIS  [place]-1.4 Altered GI function As related to diverticulitis.  As evidenced by recent ED admission r/t LLQ pain, intermittent constipation/diarrhea.   NUTRITION  INTERVENTION  Nutrition education (E-1) on the following topics:  Educated patient on pathophysiology of diverticulitis. Educated patient on nutritional considerations to prevent flare ups. Educated patient on sources of dietary fiber and proper progression of fiber intake. Educated patient on potential sources of irritants like raw fruit/vegetable peel/skin, seeds, nuts, whole grains.  Handouts Provided Include  Fiber Content of Foods Fiber Restricted Nutrition Therapy  Learning Style & Readiness for Change Teaching method utilized: Visual & Auditory  Demonstrated degree of understanding via: Teach Back  Barriers to learning/adherence to lifestyle change: None  Goals Established by Pt Weigh yourself once weekly in the morning, look for weight loss of 1-2 pounds per weight. If using a protein powder, try a Whey Isolate powder. Add half a banana if you like, and try PB2 powdered peanut butter for extra protein/fiber and great flavor. Look into the [employer] Switch for an option to increase your activity at home in the evenings.   MONITORING & EVALUATION Dietary intake, weekly physical activity, and GI symptoms in 6 weeks.  Next Steps  Patient is to slowly increase fiber intake, follow up for employee wellness visit 3 of 3.

## 2024-06-01 DIAGNOSIS — M9903 Segmental and somatic dysfunction of lumbar region: Secondary | ICD-10-CM | POA: Diagnosis not present

## 2024-06-01 DIAGNOSIS — M955 Acquired deformity of pelvis: Secondary | ICD-10-CM | POA: Diagnosis not present

## 2024-06-01 DIAGNOSIS — M62830 Muscle spasm of back: Secondary | ICD-10-CM | POA: Diagnosis not present

## 2024-06-01 DIAGNOSIS — M9905 Segmental and somatic dysfunction of pelvic region: Secondary | ICD-10-CM | POA: Diagnosis not present

## 2024-06-02 NOTE — Telephone Encounter (Unsigned)
 Copied from CRM [id number]. Topic: Referral - Question >> Jun 02, 2024  2:33 PM Alexandria E wrote: Reason for CRM: Patient called in regarding her referral for [facility]. Patient stated she has tried contact them several times and cannot get in touch with anyone to schedule an appointment, patient questioning if PCP's office would be able to follow up with this specialty.

## 2024-06-08 NOTE — Progress Notes (Unsigned)
 06/09/2024 Colleen Washington [id number] [birth year]  Gastroenterology Office Note     Primary Care Physician:  Marylynn Verneita CROME, MD  Primary GI Provider: Jinny Carmine, MD    Chief Complaint   Chief Complaint  Patient presents with   Follow-up    Diverticulitis in early September- took full course of antibiotics- no symptoms- normal BM's -     History of Present Illness   Colleen Washington is a 63 y.o. female with PMHX of IBS-D, hiatal hernia repair presenting today following recent diverticulitis.   Patient reports she was seen in the emergency room at the end of August for abdominal pain and was treated for diverticulitis.  She states this was the first time she ever had diverticulitis and she is currently not experiencing any symptoms.  She reports that she felt like popcorn caused her to have diverticulitis.  She denies melena or hematochezia or abdominal pain at this time.  Patient reports she had hiatal hernia repair in 10/2023.  She was having bad reflux prior to the surgery and is doing much better now.  She may have occasional nausea but she will take Prilosec and it helps.  Denies NSAID use, rare alcohol consumption.  She drinks 1 sugar-free soda daily and 1 cup of coffee.  Denies nocturnal symptoms.  Patient reports she has history of IBS-diarrhea.  She is having a daily bowel movement that is soft and formed.  Intermittently she may have some diarrhea depending on what she eats.  She will take Iodium and it stops the diarrhea.    Patient seen at the emergency room 03/11/2024 for abdominal pain. She was treated for acute uncomplicated diverticulitis.   03/11/2024 CT ABD/Pelvis with contrast 1. Acute diverticulitis of the proximal sigmoid colon. No perforation or abscess. 2. Normal appendix. 3. Normal gallbladder.  05/22/2023 Colonoscopy - One 4 mm polyp in the transverse colon, removed with a cold snare. Resected and retrieved.  - Two 2 to 3 mm polyps in the sigmoid colon,  removed with a cold snare. Resected and retrieved.  - Diverticulosis in the entire examined colon.  - Non-bleeding internal hemorrhoids. - repreat in 5 years  05/22/2023 EGD - 5 cm hiatal hernia.  - LA Grade C reflux esophagitis with no bleeding.  - Normal stomach.  - Normal examined duodenum.  - No specimens collected.   Patient last seen by Ellouise Console on 04/15/2023.   Past Medical History:  Diagnosis Date   allergic rhinitis    Anxiety    AR (allergic rhinitis)    Asthma    Bronchitis, chronic (HCC)    Depression    Dyspnea on exertion 10/22/2019   Endometriosis    GERD (gastroesophageal reflux disease)    H/O migraine    Herniated intervertebral disc    History of diverticulitis of colon    History of hiatal hernia    Hyperlipidemia    Hypertension    IBS (irritable bowel syndrome)    Irritable bowel syndrome    Menopause    Nonobstructive CAD (coronary artery disease)    a. 11/2019 Cor CTA: Cor Ca2+ = 141 (95th %'ile). LAD 25-49p.   Ovarian cyst, right 09/30/2015   PAC (premature atrial contraction) 10/22/2019   Polyp of transverse colon 05/22/2023   Psoriasis    SVT (supraventricular tachycardia) 08/15/2018   Thyroid  nodule 10/28/2016   Tobacco abuse    Vitamin D  deficiency 05/26/2015    Past Surgical History:  Procedure Laterality Date  ABDOMINAL HYSTERECTOMY  2003   [id number]   BREAST BIOPSY Left 08/2014   benign-BENIGN BREAST TISSUE WITH FIBROSIS   COLONOSCOPY WITH PROPOFOL  N/A 05/22/2023   Procedure: COLONOSCOPY WITH PROPOFOL ;  Surgeon: Jinny Carmine, MD;  Location: [facility];  Service: Endoscopy;  Laterality: N/A;   DILATION AND CURETTAGE OF UTERUS     ESOPHAGOGASTRODUODENOSCOPY (EGD) WITH PROPOFOL  N/A 05/22/2023   Procedure: ESOPHAGOGASTRODUODENOSCOPY (EGD) WITH PROPOFOL ;  Surgeon: Jinny Carmine, MD;  Location: [facility];  Service: Endoscopy;  Laterality: N/A;   INSERTION OF MESH  10/21/2023   Procedure: INSERTION OF MESH;  Surgeon: Jordis Laneta FALCON, MD;  Location: [facility];  Service: General;;   POLYPECTOMY  05/22/2023   Procedure: POLYPECTOMY;  Surgeon: Jinny Carmine, MD;  Location: [facility];  Service: Endoscopy;;   XI ROBOTIC ASSISTED PARAESOPHAGEAL HERNIA REPAIR N/A 10/21/2023   Procedure: REPAIR, HERNIA, PARAESOPHAGEAL, ROBOT-ASSISTED;  Surgeon: Jordis Laneta FALCON, MD;  Location: [facility] OR[facility];  Service: General;  Laterality: N/A;    Current Outpatient Medications  Medication Sig Dispense Refill   albuterol  (VENTOLIN  HFA) 108 (90 Base) MCG/ACT inhaler Inhale 1 puff into the lungs every 6 (six) hours as needed for wheezing or shortness of breath. 18 each 2   aspirin  EC 81 MG tablet Take 1 tablet (81 mg total) by mouth daily. Swallow whole.     buPROPion  (WELLBUTRIN  XL) 150 MG 24 hr tablet TAKE 1 TABLET BY MOUTH EVERY DAY 90 tablet 1   cetirizine  (ZYRTEC  ALLERGY) 10 MG tablet Take 1 tablet (10 mg total) by mouth daily. 90 tablet 1   fluticasone  (FLONASE ) 50 MCG/ACT nasal spray Place 2 sprays into both nostrils daily. 16 g 6   Multiple Vitamins-Minerals (ALIVE WOMENS 50+ GUMMY PO) Take 2 each by mouth daily.     Na Sulfate-K Sulfate-Mg Sulfate concentrate (SUPREP) 17.5-3.13-1.6 GM/177ML SOLN Take 1 kit (354 mLs total) by mouth once for 1 dose. At 5 PM the day before your procedure pour the contents of one bottle of Suprep into the mixing container provided.  Fill the container, with ice cold water, up to the 16 oz fill line, and drink the entire amount. Then 5 hours before procedure pour the contents of the second bottle of Suprep into the mixing container provided and follow the same instructions. 354 mL 0   ondansetron  (ZOFRAN ) 4 MG tablet Take 1 tablet (4 mg total) by mouth every 8 (eight) hours as needed for nausea or vomiting. 30 tablet 1   ondansetron  (ZOFRAN -ODT) 4 MG disintegrating tablet Take 1 tablet (4 mg total) by mouth every 8 (eight) hours as needed for nausea or vomiting. 20 tablet 0   promethazine  (PHENERGAN ) 12.5 MG tablet TAKE 1  TABLET (12.5 MG TOTAL) BY MOUTH EVERY 4 HOURS AS NEEDED FOR NAUSEA AND VOMITING 30 tablet 0   rosuvastatin  (CRESTOR ) 5 MG tablet Take 1 tablet (5 mg total) by mouth daily. 90 tablet 3   SKYRIZI  PEN 150 MG/ML pen ADMINISTER 1 INJECTION (150 MG/ML) UNDER THE SKIN EVERY 12 WEEKS AS DIRECTED FOR MAINTENANCE 1 mL 2   Vitamin D -Vitamin K (VITAMIN K2-VITAMIN D3 PO) Take 1 tablet by mouth daily.     No current facility-administered medications for this visit.    Allergies as of 06/09/2024 - Review Complete 06/09/2024  Allergen Reaction Noted   Compazine [prochlorperazine] Swelling 03/04/2012   Prochlorperazine edisylate  04/28/2006   Prochlorperazine maleate  04/28/2006    Family History  Problem Relation Age of Onset   Arthritis Mother  Hyperlipidemia Mother    Cancer Father        colon   Diabetes Brother    Breast cancer Paternal Aunt 8   Arthritis Maternal Grandmother    Hyperlipidemia Maternal Grandfather    Heart disease Neg Hx    Colon cancer Neg Hx     Social History   Socioeconomic History   Marital status: Married    Spouse name: Nancyann   Number of children: Not on file   Years of education: Not on file   Highest education level: Associate degree: occupational, scientist, product/process development, or vocational program  Occupational History   Not on file  Tobacco Use   Smoking status: Every Day    Current packs/day: 0.00    Average packs/day: 0.3 packs/day for 20.0 years (5.0 ttl pk-yrs)    Types: Cigarettes    Start date: 11/14/2001    Last attempt to quit: 11/14/2021    Years since quitting: 2.5    Passive exposure: Past   Smokeless tobacco: Never   Tobacco comments:       Vaping Use   Vaping status: Never Used  Substance and Sexual Activity   Alcohol use: Yes    Alcohol/week: 0.0 standard drinks of alcohol    Comment: occas   Drug use: No   Sexual activity: Yes    Birth control/protection: Surgical  Other Topics Concern   Not on file  Social History Narrative   Not on file    Social Drivers of Health   Financial Resource Strain: Low Risk  (02/19/2024)   Overall Financial Resource Strain (CARDIA)    Difficulty of Paying Living Expenses: Not hard at all  Food Insecurity: No Food Insecurity (02/19/2024)   Hunger Vital Sign    Worried About Running Out of Food in the Last Year: Never true    Ran Out of Food in the Last Year: Never true  Transportation Needs: No Transportation Needs (02/19/2024)   PRAPARE - Administrator, Civil Service (Medical): No    Lack of Transportation (Non-Medical): No  Physical Activity: Insufficiently Active (02/19/2024)   Exercise Vital Sign    Days of Exercise per Week: 3 days    Minutes of Exercise per Session: 30 min  Stress: No Stress Concern Present (02/19/2024)   [employer] of Occupational Health - Occupational Stress Questionnaire    Feeling of Stress: Not at all  Social Connections: Socially Integrated (02/19/2024)   Social Connection and Isolation Panel    Frequency of Communication with Friends and Family: More than three times a week    Frequency of Social Gatherings with Friends and Family: More than three times a week    Attends Religious Services: More than 4 times per year    Active Member of Clubs or Organizations: Yes    Attends Banker Meetings: More than 4 times per year    Marital Status: Married  Catering Manager Violence: Not At Risk (10/21/2023)   Humiliation, Afraid, Rape, and Kick questionnaire    Fear of Current or Ex-Partner: No    Emotionally Abused: No    Physically Abused: No    Sexually Abused: No     RELEVANT GI HISTORY, IMAGING AND LABS: CBC    Component Value Date/Time   WBC 8.0 03/11/2024 1130   RBC 4.59 03/11/2024 1130   HGB 13.5 03/11/2024 1130   HGB 13.1 08/05/2023 1533   HCT 41.0 03/11/2024 1130   HCT 39.5 08/05/2023 1533   PLT 178 03/11/2024  1130   PLT 186 08/05/2023 1533   MCV 89.3 03/11/2024 1130   MCV 88 08/05/2023 1533   MCH 29.4 03/11/2024 1130    MCHC 32.9 03/11/2024 1130   RDW 13.1 03/11/2024 1130   RDW 12.4 08/05/2023 1533   LYMPHSABS 2.2 02/19/2024 1225   LYMPHSABS 2.7 08/05/2023 1533   MONOABS 0.4 02/19/2024 1225   EOSABS 0.3 02/19/2024 1225   EOSABS 0.3 08/05/2023 1533   BASOSABS 0.0 02/19/2024 1225   BASOSABS 0.0 08/05/2023 1533   Recent Labs    08/05/23 1533 10/22/23 0537 02/19/24 1225 03/11/24 1130  HGB 13.1 12.3 13.8 13.5    CMP     Component Value Date/Time   NA 139 03/11/2024 1130   NA 141 08/05/2023 1533   K 4.1 03/11/2024 1130   CL 105 03/11/2024 1130   CO2 25 03/11/2024 1130   GLUCOSE 167 (H) 03/11/2024 1130   BUN 9 03/11/2024 1130   BUN 13 08/05/2023 1533   CREATININE 0.82 03/11/2024 1130   CREATININE 0.85 10/14/2016 1652   CALCIUM  9.2 03/11/2024 1130   PROT 7.3 03/11/2024 1130   PROT 6.9 08/05/2023 1533   ALBUMIN 3.9 03/11/2024 1130   ALBUMIN 4.4 08/05/2023 1533   AST 19 03/11/2024 1130   ALT 18 03/11/2024 1130   ALKPHOS 70 03/11/2024 1130   BILITOT 1.2 03/11/2024 1130   BILITOT 0.5 08/05/2023 1533   GFRNONAA >60 03/11/2024 1130   GFRAA >60 11/19/2019 0913      Latest Ref Rng & Units 03/11/2024   11:30 AM 02/19/2024   12:25 PM 10/22/2023    5:37 AM  Hepatic Function  Total Protein 6.5 - 8.1 g/dL 7.3  7.3  6.7   Albumin 3.5 - 5.0 g/dL 3.9  4.5  3.6   AST 15 - 41 U/L 19  20  146   ALT 0 - 44 U/L 18  23  183   Alk Phosphatase 38 - 126 U/L 70  74  58   Total Bilirubin 0.0 - 1.2 mg/dL 1.2  0.7  0.7       Review of Systems   All systems reviewed and negative except where noted in HPI.    Physical Exam  BP 122/82   Pulse 88   Temp 97.9 F (36.6 C)   Ht 5' 7 (1.702 m)   Wt 204 lb 9.6 oz (92.8 kg)   SpO2 96%   BMI 32.04 kg/m  No LMP recorded. Patient has had a hysterectomy. General:   Alert and oriented. Pleasant and cooperative. Well-nourished and well-developed.  In no acute distress Head:  Normocephalic and atraumatic. Eyes:  Without icterus Ears:  Normal auditory  acuity. Lungs:  Respirations even and unlabored.  Clear throughout to auscultation.   No wheezes, crackles, or rhonchi. No acute distress. Heart:  Regular rate and rhythm; no murmurs, clicks, rubs, or gallops. Abdomen:  Normal bowel sounds.  No bruits.  Soft, non-tender and non-distended without masses, hepatosplenomegaly or hernias noted.  No guarding or rebound tenderness.  Rectal:  Deferred. Msk:  Symmetrical without gross deformities. Normal posture. Extremities:  Without edema. Neurologic:  Alert and  oriented x4;  grossly normal neurologically. Skin:  Intact without significant lesions or rashes. Psych:  Alert and cooperative. Normal mood and affect.   Assessment & Plan   Colleen Washington is a 63 y.o. female presenting today following treatment for diverticulitis a couple months ago.  Recent diverticulitis-finished treatment in early September.  No longer having symptoms. -  Discussed ways to help prevent diverticulitis such as continuing to eat high-fiber diet, increasing water, staying physically active,  maintain health weight. - Proceed with colonoscopy in near future: the risks, benefits, and alternatives have been discussed with the patient in detail, which include, but are not limited to: bleeding, infection, perforation & drug reaction. The patient states understanding and desires to proceed.  - GERD-well-controlled at this time.   - Continue to take Prilosec if needed for intermittent nausea.   IBS-D. Well controlled.  - Continue imodium if needed.   Grayce Bohr, DNP, AGNP-C [facility]

## 2024-06-09 ENCOUNTER — Ambulatory Visit (INDEPENDENT_AMBULATORY_CARE_PROVIDER_SITE_OTHER): Admitting: Family Medicine

## 2024-06-09 ENCOUNTER — Encounter: Payer: Self-pay | Admitting: Family Medicine

## 2024-06-09 VITALS — BP 122/82 | HR 88 | Temp 97.9°F | Ht 67.0 in | Wt 204.6 lb

## 2024-06-09 DIAGNOSIS — K5792 Diverticulitis of intestine, part unspecified, without perforation or abscess without bleeding: Secondary | ICD-10-CM

## 2024-06-09 DIAGNOSIS — K219 Gastro-esophageal reflux disease without esophagitis: Secondary | ICD-10-CM

## 2024-06-09 MED ORDER — NA SULFATE-K SULFATE-MG SULF 17.5-3.13-1.6 GM/177ML PO SOLN: 1.0000 | Freq: Once | ORAL | 0 refills | Status: AC

## 2024-06-17 ENCOUNTER — Encounter: Payer: Self-pay | Admitting: Surgery

## 2024-07-01 ENCOUNTER — Encounter: Payer: Self-pay | Admitting: Dietician

## 2024-07-01 ENCOUNTER — Ambulatory Visit: Attending: Internal Medicine | Admitting: Dietician

## 2024-07-01 VITALS — Ht 67.5 in | Wt 200.8 lb

## 2024-07-01 DIAGNOSIS — E639 Nutritional deficiency, unspecified: Secondary | ICD-10-CM | POA: Insufficient documentation

## 2024-07-01 NOTE — Patient Instructions (Addendum)
 Work on limiting your fats to help reduce reflux. Use your GERD handout to identify foods that may trigger symptoms.  Log your food choices and stress levels (Out of 10) when your having symptoms of nausea.   Look into exercise classes at [facility] to do during lunch when work slows down!

## 2024-07-01 NOTE — Progress Notes (Signed)
 Medical Nutrition Therapy  Appointment Start time:  1620  Appointment End time:  1650 Employee Wellness Visit 3 of 3 EID#: [id number]  Primary concerns today: Diverticulitis, Nausea Referral diagnosis: N/A Preferred learning style: No preference indicated Learning readiness: Ready   NUTRITION ASSESSMENT    Clinical Medical Hx: CAD, HLD, GERD, IBS, Diverticulitis, Vit D deficiency, Psoriasis Medications: Omeprazole , Rosuvastatin , Prednisone , Promethazine , Skyrizi  Labs (03/11/2024): Glucose - 167 mg/dL, (07/16/7972): J8r - 3.8% Notable Signs/Symptoms: LLQ discomfort   Lifestyle & Dietary Hx Pt reports seeing gastroenterologist 3 weeks ago, reports colonoscopy revealed diverticulitis and a few polyps, will be following up for another colonoscopy in February. Pt reports continuing success with weight loss, states feeling like their clothes are fitting looser. Pt reports eating until satisfaction, not cleaning their plate. Pt reports less of an appetite and waves of nausea recently, states it is stress related. Pt reports taking Dramamine for nausea with some relief, and has tried Nexium  for reflux without any definitive change in symptoms. Pt reports having a few very active days at work,  but no structured exercise outside of that.    Estimated daily fluid intake: 48-64 oz Supplements: MVI Sleep: Moderately well Stress / self-care: Higher recently Current average weekly physical activity: ADLs, no exercise   24-Hr Dietary Recall First Meal: SlimFast shake, coffee w/ fat-free hazelnut creamer Snack:  Second Meal:  Snack:  Third Meal: Small portion of spaghetti, 1 meatball, small salad, cupcake, Coke Snack:  Beverages: Water, Diet Dr. Nunzio   NUTRITION DIAGNOSIS  Lupton-1.4 Altered GI function As related to diverticulitis.  As evidenced by recent ED admission r/t LLQ pain, intermittent constipation/diarrhea.   NUTRITION INTERVENTION  Nutrition education (E-1) on the following  topics:  Educated patient on pathophysiology of diverticulitis. Educated patient on nutritional considerations to prevent flare ups. Educated patient on sources of dietary fiber and proper progression of fiber intake. Educated patient on potential sources of irritants like raw fruit/vegetable peel/skin, seeds, nuts, whole grains. NEW: Educated pt on potential food sources (high fat foods,spicy foods, high refined sugar foods, caffeine, etc.) that can trigger reflux and/or other GERD symptoms including chest pain. Educated pt to avoid eating within 2-3 hours of going to bed to minimize reflux. Advised pt to sleep with head elevated to decrease reflux. Educated pt on the potential health complications that can occur related to uncontrolled GERD, including stomach ulcers and esophageal erosion/cancer. Educated pt on the potential for B12 deficiency related to acid reducing medication.   Handouts Provided Include  Fiber Content of Foods Fiber Restricted Nutrition Therapy NEW: GERD Nutrition Therapy  Learning Style & Readiness for Change Teaching method utilized: Visual & Auditory  Demonstrated degree of understanding via: Teach Back  Barriers to learning/adherence to lifestyle change: None  Goals Established by Pt Work on limiting your fats to help reduce reflux. Use your GERD handout to identify foods that may trigger symptoms. Log your food choices and stress levels (Out of 10) when your having symptoms of nausea.  Look into exercise classes at [employer] to do during lunch when work slows down!   MONITORING & EVALUATION Dietary intake, weekly physical activity, and GI symptoms PRN  Next Steps  Patient is to follow up PRN

## 2024-07-17 ENCOUNTER — Other Ambulatory Visit: Payer: Self-pay | Admitting: Internal Medicine

## 2024-08-02 ENCOUNTER — Ambulatory Visit: Admitting: Dermatology

## 2024-08-04 ENCOUNTER — Ambulatory Visit: Admitting: Dermatology

## 2024-08-04 ENCOUNTER — Encounter: Payer: Self-pay | Admitting: Dermatology

## 2024-08-04 DIAGNOSIS — L409 Psoriasis, unspecified: Secondary | ICD-10-CM

## 2024-08-04 DIAGNOSIS — Z79899 Other long term (current) drug therapy: Secondary | ICD-10-CM

## 2024-08-04 DIAGNOSIS — L820 Inflamed seborrheic keratosis: Secondary | ICD-10-CM

## 2024-08-04 DIAGNOSIS — I8393 Asymptomatic varicose veins of bilateral lower extremities: Secondary | ICD-10-CM

## 2024-08-04 NOTE — Progress Notes (Signed)
 "  Follow-Up Visit   Subjective  Colleen Washington is a 64 y.o. female who presents for the following: Psoriasis, Skyrizi  sq injections q 12 wks, Enstilar  foam prn, check spot scalp- irritating and tends to pick at it.   The following portions of the chart were reviewed this encounter and updated as appropriate: medications, allergies, medical history  Review of Systems:  No other skin or systemic complaints except as noted in HPI or Assessment and Plan.  Objective  Well appearing patient in no apparent distress; mood and affect are within normal limits.   A focused examination was performed of the following areas: Feet, scalp, hands  Relevant exam findings are noted in the Assessment and Plan.  vertex scalp x 1 Stuck on waxy papule with erythema  Assessment & Plan   PSORIASIS on Systemic Treatment feet Exam: hyperkeratosis BL heels, plantar feet  <1% BSA on treatment.  Chronic condition with duration or expected duration over one year. Currently well-controlled.   Labs will order CMP, CBC, QuantiFeron-Gold  Psoriasis - severe on systemic treatment.  Psoriasis is a chronic non-curable, but treatable genetic/hereditary disease that may have other systemic features affecting other organ systems such as joints (Psoriatic Arthritis).  It is linked with heart disease, inflammatory bowel disease, non-alcoholic fatty liver disease, and depression. Significant skin psoriasis and/or psoriatic arthritis may have significant symptoms and affects activities of daily activity and often benefits from systemic treatments.  These systemic treatments have some potential side effects including immunosuppression and require pre-treatment laboratory screening and periodic laboratory monitoring and periodic in person evaluation and monitoring by the attending dermatologist physician (long term medication management).    Treatment Plan: Pending labs, cont Skyrizi  sq injections q 12 wks Cont Enstillar  foam prn flare Recommend starting moisturizer with exfoliant (Urea, Salicylic acid, or Lactic acid) one to two times daily to help smooth rough and bumpy skin.  OTC options include Cetaphil Rough and Bumpy lotion (Urea), Eucerin Roughness Relief lotion or spot treatment cream (Urea), CeraVe SA lotion/cream for Rough and Bumpy skin (Sal Acid), Gold Bond Rough and Bumpy cream (Sal Acid), and AmLactin 12% lotion/cream (Lactic Acid).   Reviewed risks of biologics including immunosuppression, infections (i.e. TB reactivation), injection site reaction, and failure to improve condition. Goal is control of skin condition, not cure.  Some older biologics such as Humira and Enbrel may slightly increase risk of malignancy and may worsen congestive heart failure.  Taltz, Cosentyx , and Bimzelx may cause inflammatory bowel disease to flare or may increase incidence of yeast infections. Skyrizi , Tremfya, and Stelara may also slightly increase risk of infection. The use of biologics requires long term medication management, including periodic office visits, annual TB screening test and monitoring of blood work.  Monitoring for Tuberculosis Infection:  Patient is currently receiving biologic therapy for psoriasis, which carries a potential risk for reactivation of tuberculosis infection. As part of ongoing safety monitoring, a QuantiFERON-TB Gold (QFT-G) test is checked prior to initiation of biologic therapy and yearly per guidelines. - Most recent QFT-G result:     Latest Ref Rng & Units 08/05/2023    3:33 PM  Quantiferon TB Gold  Quantiferon TB Gold Plus Negative Negative     - Will recheck yearly  - No signs or symptoms of active TB noted. - Will continue to monitor per standard biologic safety protocol. - If QFT-G becomes positive or patient develops symptoms suggestive of TB, appropriate evaluation will be initiated  Long term medication management.  Patient  is using long term (months to years) prescription  medication  to control their dermatologic condition.  These medications require periodic monitoring to evaluate for efficacy and side effects and may require periodic laboratory monitoring.    Varicose Veins/Spider Veins - Dilated blue, purple or red veins at the lower extremities - Reassured - Smaller vessels can be treated by sclerotherapy (a procedure to inject a medicine into the veins to make them disappear) if desired, but the treatment is not covered by insurance. Larger vessels may be covered if symptomatic and we would refer to vascular surgeon if treatment desired.  INFLAMED SEBORRHEIC KERATOSIS vertex scalp x 1 Symptomatic, irritating, patient would like treated. - Destruction of lesion - vertex scalp x 1  Destruction method: cryotherapy   Informed consent: discussed and consent obtained   Timeout:  patient name, date of birth, surgical site, and procedure verified Lesion destroyed using liquid nitrogen: Yes   Region frozen until ice ball extended beyond lesion: Yes   Outcome: patient tolerated procedure well with no complications   Post-procedure details: wound care instructions given   Additional details:  Prior to procedure, discussed risks of blister formation, small wound, skin dyspigmentation, or rare scar following cryotherapy. Recommend Vaseline ointment to treated areas while healing.   PSORIASIS   This Visit - Comprehensive metabolic panel - CBC with Differential/Platelet - QuantiFERON-TB Gold Plus  Return in about 6 months (around 02/01/2025) for Psoriasis f/u.  I, Grayce Saunas, RMA, am acting as scribe for Rexene Rattler, MD .   Documentation: I have reviewed the above documentation for accuracy and completeness, and I agree with the above.  Rexene Rattler, MD    "

## 2024-08-04 NOTE — Patient Instructions (Addendum)
 Recommend starting moisturizer with exfoliant (Urea, Salicylic acid, or Lactic acid) one to two times daily to help smooth rough and bumpy skin.  OTC options include Cetaphil Rough and Bumpy lotion (Urea), Eucerin Roughness Relief lotion or spot treatment cream (Urea), CeraVe SA lotion/cream for Rough and Bumpy skin (Sal Acid), Gold Bond Rough and Bumpy cream (Sal Acid), and AmLactin 12% lotion/cream (Lactic Acid).  If applying in morning, also apply sunscreen to sun-exposed areas, since these exfoliating moisturizers can increase sensitivity to sun.   Cryotherapy Aftercare  Wash gently with soap and water everyday.   Apply Vaseline and Band-Aid daily until healed.    Due to recent changes in healthcare laws, you may see results of your pathology and/or laboratory studies on MyChart before the doctors have had a chance to review them. We understand that in some cases there may be results that are confusing or concerning to you. Please understand that not all results are received at the same time and often the doctors may need to interpret multiple results in order to provide you with the best plan of care or course of treatment. Therefore, we ask that you please give us  2 business days to thoroughly review all your results before contacting the office for clarification. Should we see a critical lab result, you will be contacted sooner.   If You Need Anything After Your Visit  If you have any questions or concerns for your doctor, please call our main line at [phone] and press option 4 to reach your doctor's medical assistant. If no one answers, please leave a voicemail as directed and we will return your call as soon as possible. Messages left after 4 pm will be answered the following business day.   You may also send us  a message via MyChart. We typically respond to MyChart messages within 1-2 business days.  For prescription refills, please ask your pharmacy to contact our office. Our fax  number is [phone].  If you have an urgent issue when the clinic is closed that cannot wait until the next business day, you can page your doctor at the number below.    Please note that while we do our best to be available for urgent issues outside of office hours, we are not available 24/7.   If you have an urgent issue and are unable to reach us , you may choose to seek medical care at your doctor's office, retail clinic, urgent care center, or emergency room.  If you have a medical emergency, please immediately call 911 or go to the emergency department.  Pager Numbers  - Dr. Hester: [phone]  - Dr. Jackquline: [phone]  - Dr. Claudene: [phone]   - Dr. Raymund: [phone]  In the event of inclement weather, please call our main line at [phone] for an update on the status of any delays or closures.  Dermatology Medication Tips: Please keep the boxes that topical medications come in in order to help keep track of the instructions about where and how to use these. Pharmacies typically print the medication instructions only on the boxes and not directly on the medication tubes.   If your medication is too expensive, please contact our office at [phone] option 4 or send us  a message through MyChart.   We are unable to tell what your co-pay for medications will be in advance as this is different depending on your insurance coverage. However, we may be able to find a substitute medication at lower cost or fill out  paperwork to get insurance to cover a needed medication.   If a prior authorization is required to get your medication covered by your insurance company, please allow us  1-2 business days to complete this process.  Drug prices often vary depending on where the prescription is filled and some pharmacies may offer cheaper prices.  The website www.goodrx.com contains coupons for medications through different pharmacies. The prices here do not account for  what the cost may be with help from insurance (it may be cheaper with your insurance), but the website can give you the price if you did not use any insurance.  - You can print the associated coupon and take it with your prescription to the pharmacy.  - You may also stop by our office during regular business hours and pick up a GoodRx coupon card.  - If you need your prescription sent electronically to a different pharmacy, notify our office through [facility] or by phone at [phone] option 4.     Si Usted Necesita Algo Despus de Su Visita  Tambin puede enviarnos un mensaje a travs de Clinical Cytogeneticist. Por lo general respondemos a los mensajes de MyChart en el transcurso de 1 a 2 das hbiles.  Para renovar recetas, por favor pida a su farmacia que se ponga en contacto con nuestra oficina. Randi lakes de fax es [place] [phone].  Si tiene un asunto urgente cuando la clnica est cerrada y que no puede esperar hasta el siguiente da hbil, puede llamar/localizar a su doctor(a) al nmero que aparece a continuacin.   Por favor, tenga en cuenta que aunque hacemos todo lo posible para estar disponibles para asuntos urgentes fuera del horario de [place], no estamos disponibles las 24 horas del da, los 7 [address] de la [place].   Si tiene un problema urgente y no puede comunicarse con nosotros, puede optar por buscar atencin mdica  en el consultorio de su doctor(a), en una clnica privada, en un centro de atencin urgente o en una sala de emergencias.  Si tiene engineer, drilling, por favor llame inmediatamente al 911 o vaya a la sala de emergencias.  Nmeros de bper  - Dr. Hester: [phone]  - Dra. Jackquline: [phone]  - Dr. Claudene: [phone]  - Dra. Kitts: [phone]  En caso de inclemencias del [place], por favor llame a nuestra lnea principal al [phone] para una actualizacin sobre el estado de cualquier retraso o cierre.  Consejos para la medicacin en  dermatologa: Por favor, guarde las cajas en las que vienen los medicamentos de uso tpico para ayudarle a seguir las instrucciones sobre dnde y cmo usarlos. Las farmacias generalmente imprimen las instrucciones del medicamento slo en las cajas y no directamente en los tubos del [place].   Si su medicamento es muy caro, por favor, pngase en contacto con landry rieger llamando al [phone] y presione la opcin 4 o envenos un mensaje a travs de Clinical Cytogeneticist.   No podemos decirle cul ser su copago por los medicamentos por adelantado ya que esto es diferente dependiendo de la cobertura de su seguro. Sin embargo, es posible que podamos encontrar un medicamento sustituto a audiological scientist un formulario para que el seguro cubra el medicamento que se considera necesario.   Si se requiere una autorizacin previa para que su compaa de seguros cubra su medicamento, por favor permtanos de 1 a 2 das hbiles para completar este proceso.  Los precios de los medicamentos varan con frecuencia dependiendo del environmental consultant de dnde se  surte la receta y alguna farmacias pueden ofrecer precios ms baratos.  El sitio web www.goodrx.com tiene cupones para medicamentos de health and safety inspector. Los precios aqu no tienen en cuenta lo que podra costar con la ayuda del seguro (puede ser ms barato con su seguro), pero el sitio web puede darle el precio si no utiliz tourist information centre manager.  - Puede imprimir el cupn correspondiente y llevarlo con su receta a la farmacia.  - Tambin puede pasar por nuestra oficina durante el horario de atencin regular y education officer, museum una tarjeta de cupones de GoodRx.  - Si necesita que su receta se enve electrnicamente a una farmacia diferente, informe a nuestra oficina a travs de MyChart de [place] o por telfono llamando al [phone] y presione la opcin 4.

## 2024-08-09 ENCOUNTER — Inpatient Hospital Stay
Admission: EM | Admit: 2024-08-09 | Discharge: 2024-08-11 | DRG: 493 | Disposition: A | Discharge status: Home / Self Care | Attending: Internal Medicine | Admitting: Internal Medicine

## 2024-08-09 ENCOUNTER — Other Ambulatory Visit: Payer: Self-pay

## 2024-08-09 ENCOUNTER — Emergency Department

## 2024-08-09 DIAGNOSIS — Z6830 Body mass index (BMI) 30.0-30.9, adult: Secondary | ICD-10-CM

## 2024-08-09 DIAGNOSIS — I471 Supraventricular tachycardia, unspecified: Secondary | ICD-10-CM | POA: Diagnosis present

## 2024-08-09 DIAGNOSIS — S93431A Sprain of tibiofibular ligament of right ankle, initial encounter: Secondary | ICD-10-CM | POA: Diagnosis present

## 2024-08-09 DIAGNOSIS — Z79899 Other long term (current) drug therapy: Secondary | ICD-10-CM

## 2024-08-09 DIAGNOSIS — Z83438 Family history of other disorder of lipoprotein metabolism and other lipidemia: Secondary | ICD-10-CM

## 2024-08-09 DIAGNOSIS — R609 Edema, unspecified: Secondary | ICD-10-CM | POA: Diagnosis present

## 2024-08-09 DIAGNOSIS — E66811 Obesity, class 1: Secondary | ICD-10-CM | POA: Diagnosis present

## 2024-08-09 DIAGNOSIS — E785 Hyperlipidemia, unspecified: Secondary | ICD-10-CM | POA: Diagnosis present

## 2024-08-09 DIAGNOSIS — L409 Psoriasis, unspecified: Secondary | ICD-10-CM | POA: Diagnosis present

## 2024-08-09 DIAGNOSIS — Z860100 Personal history of colon polyps, unspecified: Secondary | ICD-10-CM

## 2024-08-09 DIAGNOSIS — I10 Essential (primary) hypertension: Secondary | ICD-10-CM | POA: Diagnosis present

## 2024-08-09 DIAGNOSIS — I2510 Atherosclerotic heart disease of native coronary artery without angina pectoris: Secondary | ICD-10-CM | POA: Diagnosis present

## 2024-08-09 DIAGNOSIS — K219 Gastro-esophageal reflux disease without esophagitis: Secondary | ICD-10-CM | POA: Diagnosis present

## 2024-08-09 DIAGNOSIS — J42 Unspecified chronic bronchitis: Secondary | ICD-10-CM | POA: Diagnosis present

## 2024-08-09 DIAGNOSIS — Z833 Family history of diabetes mellitus: Secondary | ICD-10-CM | POA: Diagnosis not present

## 2024-08-09 DIAGNOSIS — Y92009 Unspecified place in unspecified non-institutional (private) residence as the place of occurrence of the external cause: Secondary | ICD-10-CM

## 2024-08-09 DIAGNOSIS — Z803 Family history of malignant neoplasm of breast: Secondary | ICD-10-CM | POA: Diagnosis not present

## 2024-08-09 DIAGNOSIS — Z7982 Long term (current) use of aspirin: Secondary | ICD-10-CM

## 2024-08-09 DIAGNOSIS — K5732 Diverticulitis of large intestine without perforation or abscess without bleeding: Secondary | ICD-10-CM | POA: Diagnosis present

## 2024-08-09 DIAGNOSIS — G43909 Migraine, unspecified, not intractable, without status migrainosus: Secondary | ICD-10-CM | POA: Diagnosis present

## 2024-08-09 DIAGNOSIS — F32A Depression, unspecified: Secondary | ICD-10-CM | POA: Diagnosis present

## 2024-08-09 DIAGNOSIS — S82851A Displaced trimalleolar fracture of right lower leg, initial encounter for closed fracture: Secondary | ICD-10-CM | POA: Diagnosis present

## 2024-08-09 DIAGNOSIS — R110 Nausea: Secondary | ICD-10-CM | POA: Diagnosis not present

## 2024-08-09 DIAGNOSIS — Z8261 Family history of arthritis: Secondary | ICD-10-CM

## 2024-08-09 DIAGNOSIS — Z90710 Acquired absence of both cervix and uterus: Secondary | ICD-10-CM | POA: Diagnosis not present

## 2024-08-09 DIAGNOSIS — S82892A Other fracture of left lower leg, initial encounter for closed fracture: Principal | ICD-10-CM | POA: Diagnosis present

## 2024-08-09 DIAGNOSIS — Z720 Tobacco use: Secondary | ICD-10-CM | POA: Diagnosis present

## 2024-08-09 DIAGNOSIS — W19XXXA Unspecified fall, initial encounter: Secondary | ICD-10-CM

## 2024-08-09 DIAGNOSIS — J4489 Other specified chronic obstructive pulmonary disease: Secondary | ICD-10-CM | POA: Diagnosis present

## 2024-08-09 DIAGNOSIS — F419 Anxiety disorder, unspecified: Secondary | ICD-10-CM | POA: Diagnosis present

## 2024-08-09 DIAGNOSIS — F17210 Nicotine dependence, cigarettes, uncomplicated: Secondary | ICD-10-CM | POA: Diagnosis present

## 2024-08-09 DIAGNOSIS — W010XXA Fall on same level from slipping, tripping and stumbling without subsequent striking against object, initial encounter: Secondary | ICD-10-CM | POA: Diagnosis present

## 2024-08-09 DIAGNOSIS — Z888 Allergy status to other drugs, medicaments and biological substances status: Secondary | ICD-10-CM

## 2024-08-09 DIAGNOSIS — S82841A Displaced bimalleolar fracture of right lower leg, initial encounter for closed fracture: Secondary | ICD-10-CM

## 2024-08-09 DIAGNOSIS — S82891A Other fracture of right lower leg, initial encounter for closed fracture: Secondary | ICD-10-CM | POA: Diagnosis present

## 2024-08-09 DIAGNOSIS — M62838 Other muscle spasm: Secondary | ICD-10-CM | POA: Diagnosis present

## 2024-08-09 LAB — COMPREHENSIVE METABOLIC PANEL WITH GFR
ALT: 17 U/L (ref 0–44)
AST: 21 U/L (ref 15–41)
Albumin: 4.2 g/dL (ref 3.5–5.0)
Alkaline Phosphatase: 80 U/L (ref 38–126)
Anion gap: 12 (ref 5–15)
BUN: 11 mg/dL (ref 8–23)
CO2: 21 mmol/L — ABNORMAL LOW (ref 22–32)
Calcium: 9.4 mg/dL (ref 8.9–10.3)
Chloride: 109 mmol/L (ref 98–111)
Creatinine, Ser: 0.72 mg/dL (ref 0.44–1.00)
GFR, Estimated: >60 mL/min
Glucose, Bld: 103 mg/dL — ABNORMAL HIGH (ref 70–99)
Potassium: 3.7 mmol/L (ref 3.5–5.1)
Sodium: 142 mmol/L (ref 135–145)
Total Bilirubin: 0.6 mg/dL (ref 0.0–1.2)
Total Protein: 6.9 g/dL (ref 6.5–8.1)

## 2024-08-09 LAB — CBC WITH DIFFERENTIAL/PLATELET
Abs Immature Granulocytes: 0.02 10*3/uL (ref 0.00–0.07)
Basophils Absolute: 0 10*3/uL (ref 0.0–0.1)
Basophils Relative: 1 %
Eosinophils Absolute: 0.2 10*3/uL (ref 0.0–0.5)
Eosinophils Relative: 4 %
HCT: 36.8 % (ref 36.0–46.0)
Hemoglobin: 12.4 g/dL (ref 12.0–15.0)
Immature Granulocytes: 0 %
Lymphocytes Relative: 34 %
Lymphs Abs: 2.4 10*3/uL (ref 0.7–4.0)
MCH: 29.4 pg (ref 26.0–34.0)
MCHC: 33.7 g/dL (ref 30.0–36.0)
MCV: 87.2 fL (ref 80.0–100.0)
Monocytes Absolute: 0.5 10*3/uL (ref 0.1–1.0)
Monocytes Relative: 7 %
Neutro Abs: 3.8 10*3/uL (ref 1.7–7.7)
Neutrophils Relative %: 54 %
Platelets: 164 10*3/uL (ref 150–400)
RBC: 4.22 MIL/uL (ref 3.87–5.11)
RDW: 13 % (ref 11.5–15.5)
WBC: 6.9 10*3/uL (ref 4.0–10.5)
nRBC: 0 % (ref 0.0–0.2)

## 2024-08-09 MED ORDER — ONDANSETRON HCL 4 MG/2ML IJ SOLN
4.0000 mg | Freq: Four times a day (QID) | INTRAMUSCULAR | Status: DC | PRN
  Administered 2024-08-09 – 2024-08-10 (×2): 4 mg via INTRAVENOUS
  Filled 2024-08-09 (×2): qty 2

## 2024-08-09 MED ORDER — FENTANYL CITRATE (PF) 50 MCG/ML IJ SOSY
50.0000 ug | PREFILLED_SYRINGE | INTRAMUSCULAR | Status: DC | PRN
  Administered 2024-08-09 (×2): 50 ug via INTRAVENOUS
  Filled 2024-08-09 (×2): qty 1

## 2024-08-09 MED ORDER — CEFAZOLIN SODIUM-DEXTROSE 2-4 GM/100ML-% IV SOLN
2.0000 g | INTRAVENOUS | Status: AC
  Administered 2024-08-10: 2 g via INTRAVENOUS
  Filled 2024-08-09: qty 100

## 2024-08-09 NOTE — ED Notes (Signed)
Pt given sandwich tray 

## 2024-08-09 NOTE — ED Provider Notes (Signed)
 "  [facility] Provider Note   Event Date/Time   First MD Initiated Contact with Patient 08/09/24 1947     (approximate) History  Ankle Pain  HPI SERRIA SLOMA is a 64 y.o. female with a past medical history of hypertension and migraines who presents after slipping and falling injuring the right ankle just prior to arrival.  Deformity and swelling patient denies any head trauma loss of consciousness.  Patient has been unable to bear weight on this ankle since the injury ROS: Patient currently denies any vision changes, tinnitus, difficulty speaking, facial droop, sore throat, chest pain, shortness of breath, abdominal pain, nausea/vomiting/diarrhea, dysuria, or weakness/numbness/paresthesias in any extremity   Physical Exam  Triage Vital Signs: ED Triage Vitals  Encounter Vitals Group     BP 08/09/24 1837 111/65     Girls Systolic BP Percentile --      Girls Diastolic BP Percentile --      Boys Systolic BP Percentile --      Boys Diastolic BP Percentile --      Pulse Rate 08/09/24 1837 73     Resp 08/09/24 1837 17     Temp 08/09/24 1837 97.7 F (36.5 C)     Temp src --      SpO2 08/09/24 1837 98 %     Weight 08/09/24 1838 196 lb (88.9 kg)     Height 08/09/24 1838 5' 7 (1.702 m)     Head Circumference --      Peak Flow --      Pain Score 08/09/24 1837 5     Pain Loc --      Pain Education --      Exclude from Growth Chart --    Most recent vital signs: Vitals:   08/11/24 0340 08/11/24 0753  BP: 113/72 (!) 103/58  Pulse: (!) 59 91  Resp: 19 18  Temp: 97.8 F (36.6 C) 97.8 F (36.6 C)  SpO2: 97% 96%   General: Awake, oriented x4. CV:  Good peripheral perfusion. Resp:  Normal effort. Abd:  No distention. Other:  Middle-aged obese Caucasian female resting comfortably in no acute distress.  Swelling to right ankle ED Results / Procedures / Treatments  Labs (all labs ordered are listed, but only abnormal results are displayed) Labs Reviewed   COMPREHENSIVE METABOLIC PANEL WITH GFR - Abnormal; Notable for the following components:      Result Value   CO2 21 (*)    Glucose, Bld 103 (*)    All other components within normal limits  CBC - Abnormal; Notable for the following components:   Hemoglobin 11.5 (*)    HCT 35.5 (*)    Platelets 147 (*)    All other components within normal limits  BASIC METABOLIC PANEL WITH GFR - Abnormal; Notable for the following components:   Glucose, Bld 113 (*)    All other components within normal limits  MRSA NEXT GEN BY PCR, NASAL  CBC WITH DIFFERENTIAL/PLATELET  PROTIME-INR  APTT  TYPE AND SCREEN   EKG ED ECG REPORT I, Artist MARLA Kerns, the attending physician, personally viewed and interpreted this ECG. Date: 08/09/2024 EKG Time: 0117 Rate: 76 Rhythm: normal sinus rhythm QRS Axis: normal Intervals: normal ST/T Wave abnormalities: normal Narrative Interpretation: no evidence of acute ischemia RADIOLOGY ED MD interpretation: X-ray of the right ankle shows mildly displaced fracture of the distal fibula mildly displaced fracture of the medial malleolus with widening of the medial clear space - All  radiology independently interpreted and agree with radiology assessment Official radiology report(s): No results found.  PROCEDURES: Critical Care performed: No Procedures MEDICATIONS ORDERED IN ED: Medications  ceFAZolin  (ANCEF ) IVPB 2g/100 mL premix (0 g Intravenous Stopped 08/10/24 0612)  metoCLOPramide  (REGLAN ) tablet 5 mg (5 mg Oral Given 08/10/24 0952)  fentaNYL  (SUBLIMAZE ) injection 50 mcg (50 mcg Intravenous Given 08/10/24 1628)  midazolam  PF (VERSED ) injection 2 mg (2 mg Intravenous Given 08/10/24 1628)   IMPRESSION / MDM / ASSESSMENT AND PLAN / ED COURSE  I reviewed the triage vital signs and the nursing notes.                             The patient is on the cardiac monitor to evaluate for evidence of arrhythmia and/or significant heart rate changes. Patient's presentation is  most consistent with acute presentation with potential threat to life or bodily function. Patient is a 64 year old female who presents after mechanical fall suffering injury to the right ankle.  X-rays showed a bimalleolar fracture with widening of the joint space.  Patient's pain well-controlled.  I spoke to the on-call podiatrist who has agreed that patient will likely need surgery however we will leave it up to the patient whether she will need admission or not.  Patient states that she does not have a safe place to go home or much help there and therefore requests admission.  I spoken to the internal medicine physician for admission was agreed to accept this patient.  Dispo: Admit to medicine   FINAL CLINICAL IMPRESSION(S) / ED DIAGNOSES   Final diagnoses:  Closed bimalleolar fracture of right ankle, initial encounter   Rx / DC Orders   ED Discharge Orders          Ordered    amoxicillin -clavulanate (AUGMENTIN ) 875-125 MG tablet  2 times daily        08/11/24 1439    rosuvastatin  (CRESTOR ) 5 MG tablet  Daily        08/11/24 1439    senna-docusate (SENOKOT-S) 8.6-50 MG tablet  At bedtime PRN        08/11/24 1439    methocarbamol  (ROBAXIN ) 500 MG tablet  Every 8 hours PRN        08/11/24 1439    aspirin  EC 81 MG tablet  Daily        08/11/24 1439    oxyCODONE -acetaminophen  (PERCOCET/ROXICET) 5-325 MG tablet  Every 4 hours PRN        08/11/24 1704           Note:  This document was prepared using Dragon voice recognition software and may include unintentional dictation errors.   Andreana Klingerman K, MD 08/16/24 [phone]  "

## 2024-08-09 NOTE — ED Triage Notes (Signed)
 Arrived by [facility] from hotel. Reports slipping and falling, injuring right ankle. Deformity and swelling noted per EMS.  Denies hitting head.  EMS vitals: 80HR 100% RA 143/72 b/p

## 2024-08-10 ENCOUNTER — Inpatient Hospital Stay: Admitting: Anesthesiology

## 2024-08-10 ENCOUNTER — Encounter: Admission: EM | Disposition: A | Payer: Self-pay | Source: Home / Self Care | Attending: Internal Medicine

## 2024-08-10 ENCOUNTER — Encounter: Payer: Self-pay | Admitting: Internal Medicine

## 2024-08-10 ENCOUNTER — Inpatient Hospital Stay

## 2024-08-10 DIAGNOSIS — E66811 Obesity, class 1: Secondary | ICD-10-CM | POA: Diagnosis not present

## 2024-08-10 DIAGNOSIS — I2510 Atherosclerotic heart disease of native coronary artery without angina pectoris: Secondary | ICD-10-CM | POA: Diagnosis not present

## 2024-08-10 DIAGNOSIS — J42 Unspecified chronic bronchitis: Secondary | ICD-10-CM

## 2024-08-10 DIAGNOSIS — Z720 Tobacco use: Secondary | ICD-10-CM | POA: Diagnosis present

## 2024-08-10 DIAGNOSIS — W19XXXA Unspecified fall, initial encounter: Secondary | ICD-10-CM

## 2024-08-10 DIAGNOSIS — S82891A Other fracture of right lower leg, initial encounter for closed fracture: Secondary | ICD-10-CM

## 2024-08-10 DIAGNOSIS — Y92009 Unspecified place in unspecified non-institutional (private) residence as the place of occurrence of the external cause: Secondary | ICD-10-CM

## 2024-08-10 DIAGNOSIS — K219 Gastro-esophageal reflux disease without esophagitis: Secondary | ICD-10-CM | POA: Diagnosis present

## 2024-08-10 DIAGNOSIS — I471 Supraventricular tachycardia, unspecified: Secondary | ICD-10-CM

## 2024-08-10 DIAGNOSIS — I10 Essential (primary) hypertension: Secondary | ICD-10-CM | POA: Diagnosis present

## 2024-08-10 DIAGNOSIS — L409 Psoriasis, unspecified: Secondary | ICD-10-CM | POA: Diagnosis present

## 2024-08-10 LAB — TYPE AND SCREEN
ABO/RH(D): A NEG
Antibody Screen: NEGATIVE

## 2024-08-10 LAB — MRSA NEXT GEN BY PCR, NASAL: MRSA by PCR Next Gen: NOT DETECTED

## 2024-08-10 LAB — BASIC METABOLIC PANEL WITH GFR
Anion gap: 8 (ref 5–15)
BUN: 11 mg/dL (ref 8–23)
CO2: 26 mmol/L (ref 22–32)
Calcium: 9.1 mg/dL (ref 8.9–10.3)
Chloride: 107 mmol/L (ref 98–111)
Creatinine, Ser: 0.75 mg/dL (ref 0.44–1.00)
GFR, Estimated: >60 mL/min
Glucose, Bld: 113 mg/dL — ABNORMAL HIGH (ref 70–99)
Potassium: 3.6 mmol/L (ref 3.5–5.1)
Sodium: 141 mmol/L (ref 135–145)

## 2024-08-10 LAB — PROTIME-INR
INR: 1 (ref 0.8–1.2)
Prothrombin Time: 13.7 s (ref 11.4–15.2)

## 2024-08-10 LAB — CBC
HCT: 35.5 % — ABNORMAL LOW (ref 36.0–46.0)
Hemoglobin: 11.5 g/dL — ABNORMAL LOW (ref 12.0–15.0)
MCH: 29 pg (ref 26.0–34.0)
MCHC: 32.4 g/dL (ref 30.0–36.0)
MCV: 89.4 fL (ref 80.0–100.0)
Platelets: 147 10*3/uL — ABNORMAL LOW (ref 150–400)
RBC: 3.97 MIL/uL (ref 3.87–5.11)
RDW: 13 % (ref 11.5–15.5)
WBC: 6.7 10*3/uL (ref 4.0–10.5)
nRBC: 0 % (ref 0.0–0.2)

## 2024-08-10 LAB — APTT: aPTT: 30 s (ref 24–36)

## 2024-08-10 MED ORDER — PANTOPRAZOLE SODIUM 40 MG PO TBEC
40.0000 mg | DELAYED_RELEASE_TABLET | Freq: Every day | ORAL | Status: DC
  Administered 2024-08-10 – 2024-08-11 (×2): 40 mg via ORAL
  Filled 2024-08-10 (×2): qty 1

## 2024-08-10 MED ORDER — SENNOSIDES-DOCUSATE SODIUM 8.6-50 MG PO TABS: 1.0000 | ORAL_TABLET | Freq: Every evening | ORAL | Status: DC | PRN

## 2024-08-10 MED ORDER — CEFAZOLIN SODIUM-DEXTROSE 2-3 GM-%(50ML) IV SOLR
INTRAVENOUS | Status: DC | PRN
  Administered 2024-08-10: 2 g via INTRAVENOUS

## 2024-08-10 MED ORDER — ONDANSETRON HCL 4 MG/2ML IJ SOLN
INTRAMUSCULAR | Status: DC | PRN
  Administered 2024-08-10: 4 mg via INTRAVENOUS

## 2024-08-10 MED ORDER — SODIUM CHLORIDE 0.9 % IV SOLN: INTRAVENOUS | Status: DC

## 2024-08-10 MED ORDER — 0.9 % SODIUM CHLORIDE (POUR BTL) OPTIME
TOPICAL | Status: DC | PRN
  Administered 2024-08-10: 1000 mL

## 2024-08-10 MED ORDER — BUPROPION HCL ER (XL) 150 MG PO TB24
150.0000 mg | ORAL_TABLET | Freq: Every day | ORAL | Status: DC
  Administered 2024-08-10 (×2): 150 mg via ORAL
  Filled 2024-08-10 (×2): qty 1

## 2024-08-10 MED ORDER — HYDROMORPHONE HCL 1 MG/ML IJ SOLN
1.0000 mg | INTRAMUSCULAR | Status: DC | PRN
  Administered 2024-08-10 (×2): 1 mg via INTRAVENOUS
  Filled 2024-08-10 (×2): qty 1

## 2024-08-10 MED ORDER — FENTANYL CITRATE (PF) 50 MCG/ML IJ SOSY
50.0000 ug | PREFILLED_SYRINGE | Freq: Once | INTRAMUSCULAR | Status: AC
  Administered 2024-08-10: 50 ug via INTRAVENOUS

## 2024-08-10 MED ORDER — FENTANYL CITRATE (PF) 50 MCG/ML IJ SOSY
PREFILLED_SYRINGE | INTRAMUSCULAR | Status: AC
  Filled 2024-08-10: qty 1

## 2024-08-10 MED ORDER — PHENYLEPHRINE 80 MCG/ML (10ML) SYRINGE FOR IV PUSH (FOR BLOOD PRESSURE SUPPORT)
PREFILLED_SYRINGE | INTRAVENOUS | Status: DC | PRN
  Administered 2024-08-10: 80 ug via INTRAVENOUS
  Administered 2024-08-10: 20 ug via INTRAVENOUS

## 2024-08-10 MED ORDER — BUPIVACAINE HCL (PF) 0.5 % IJ SOLN
INTRAMUSCULAR | Status: DC | PRN
  Administered 2024-08-10: 20 mL via PERINEURAL

## 2024-08-10 MED ORDER — DEXMEDETOMIDINE HCL IN NACL 80 MCG/20ML IV SOLN
INTRAVENOUS | Status: DC | PRN
  Administered 2024-08-10: 8 ug via INTRAVENOUS
  Administered 2024-08-10: 12 ug via INTRAVENOUS

## 2024-08-10 MED ORDER — ASPIRIN 81 MG PO TBEC: 81.0000 mg | DELAYED_RELEASE_TABLET | Freq: Every day | ORAL | Status: DC

## 2024-08-10 MED ORDER — CEFAZOLIN SODIUM-DEXTROSE 2-4 GM/100ML-% IV SOLN
INTRAVENOUS | Status: AC
  Filled 2024-08-10: qty 100

## 2024-08-10 MED ORDER — PROPOFOL 10 MG/ML IV BOLUS
INTRAVENOUS | Status: DC | PRN
  Administered 2024-08-10: 180 mg via INTRAVENOUS

## 2024-08-10 MED ORDER — HYDRALAZINE HCL 20 MG/ML IJ SOLN: 5.0000 mg | INTRAMUSCULAR | Status: DC | PRN

## 2024-08-10 MED ORDER — MIDAZOLAM HCL 2 MG/2ML IJ SOLN
INTRAMUSCULAR | Status: AC
  Filled 2024-08-10: qty 2

## 2024-08-10 MED ORDER — OXYCODONE-ACETAMINOPHEN 5-325 MG PO TABS
1.0000 | ORAL_TABLET | ORAL | Status: DC | PRN
  Administered 2024-08-10 – 2024-08-11 (×2): 1 via ORAL
  Filled 2024-08-10 (×2): qty 1

## 2024-08-10 MED ORDER — ACETAMINOPHEN 325 MG PO TABS
650.0000 mg | ORAL_TABLET | Freq: Four times a day (QID) | ORAL | Status: DC | PRN
  Administered 2024-08-11: 650 mg via ORAL
  Filled 2024-08-10: qty 2

## 2024-08-10 MED ORDER — PHENYLEPHRINE HCL-NACL 20-0.9 MG/250ML-% IV SOLN
INTRAVENOUS | Status: DC | PRN
  Administered 2024-08-10: 20 ug/min via INTRAVENOUS

## 2024-08-10 MED ORDER — MIDAZOLAM HCL (PF) 2 MG/2ML IJ SOLN
2.0000 mg | Freq: Once | INTRAMUSCULAR | Status: AC
  Administered 2024-08-10: 2 mg via INTRAVENOUS

## 2024-08-10 MED ORDER — ARTIFICIAL TEARS OPHTHALMIC OINT
TOPICAL_OINTMENT | OPHTHALMIC | Status: AC
  Filled 2024-08-10: qty 3.5

## 2024-08-10 MED ORDER — ROSUVASTATIN CALCIUM 5 MG PO TABS
5.0000 mg | ORAL_TABLET | Freq: Every day | ORAL | Status: DC
  Administered 2024-08-11: 5 mg via ORAL
  Filled 2024-08-10: qty 1

## 2024-08-10 MED ORDER — BUPIVACAINE HCL (PF) 0.5 % IJ SOLN
INTRAMUSCULAR | Status: AC
  Filled 2024-08-10: qty 20

## 2024-08-10 MED ORDER — LIDOCAINE HCL (CARDIAC) PF 100 MG/5ML IV SOSY
PREFILLED_SYRINGE | INTRAVENOUS | Status: DC | PRN
  Administered 2024-08-10: 40 mg via INTRAVENOUS

## 2024-08-10 MED ORDER — FENTANYL CITRATE (PF) 100 MCG/2ML IJ SOLN
INTRAMUSCULAR | Status: AC
  Filled 2024-08-10: qty 2

## 2024-08-10 MED ORDER — GLYCOPYRROLATE 0.2 MG/ML IJ SOLN
INTRAMUSCULAR | Status: DC | PRN
  Administered 2024-08-10: .2 mg via INTRAVENOUS

## 2024-08-10 MED ORDER — DM-GUAIFENESIN ER 30-600 MG PO TB12: 1.0000 | ORAL_TABLET | Freq: Two times a day (BID) | ORAL | Status: DC | PRN

## 2024-08-10 MED ORDER — DEXAMETHASONE SOD PHOSPHATE PF 10 MG/ML IJ SOLN
INTRAMUSCULAR | Status: DC | PRN
  Administered 2024-08-10: 10 mg via INTRAVENOUS

## 2024-08-10 MED ORDER — ALBUTEROL SULFATE (2.5 MG/3ML) 0.083% IN NEBU: 2.5000 mg | INHALATION_SOLUTION | RESPIRATORY_TRACT | Status: DC | PRN

## 2024-08-10 MED ORDER — ASPIRIN 81 MG PO TBEC: 81.0000 mg | DELAYED_RELEASE_TABLET | Freq: Two times a day (BID) | ORAL | Status: DC

## 2024-08-10 MED ORDER — LACTATED RINGERS IV SOLN: INTRAVENOUS | Status: DC | PRN

## 2024-08-10 MED ORDER — NICOTINE 21 MG/24HR TD PT24
21.0000 mg | MEDICATED_PATCH | Freq: Every day | TRANSDERMAL | Status: DC
  Filled 2024-08-10 (×2): qty 1

## 2024-08-10 MED ORDER — METOCLOPRAMIDE HCL 5 MG PO TABS
5.0000 mg | ORAL_TABLET | Freq: Once | ORAL | Status: AC
  Administered 2024-08-10: 5 mg via ORAL
  Filled 2024-08-10: qty 1

## 2024-08-10 MED ORDER — METHOCARBAMOL 500 MG PO TABS: 500.0000 mg | ORAL_TABLET | Freq: Three times a day (TID) | ORAL | Status: DC | PRN

## 2024-08-10 MED ORDER — PHENYLEPHRINE HCL (PRESSORS) 10 MG/ML IV SOLN
INTRAVENOUS | Status: AC
  Filled 2024-08-10: qty 1

## 2024-08-10 MED ORDER — AMOXICILLIN-POT CLAVULANATE 875-125 MG PO TABS
1.0000 | ORAL_TABLET | Freq: Two times a day (BID) | ORAL | Status: DC
  Administered 2024-08-11: 1 via ORAL
  Filled 2024-08-10: qty 1

## 2024-08-10 MED ORDER — BUPIVACAINE LIPOSOME 1.3 % IJ SUSP
INTRAMUSCULAR | Status: DC | PRN
  Administered 2024-08-10: 20 mL via PERINEURAL

## 2024-08-10 MED ORDER — OXYCODONE-ACETAMINOPHEN 5-325 MG PO TABS: 1.0000 | ORAL_TABLET | ORAL | Status: DC | PRN

## 2024-08-10 MED ORDER — BUPIVACAINE LIPOSOME 1.3 % IJ SUSP
INTRAMUSCULAR | Status: AC
  Filled 2024-08-10: qty 20

## 2024-08-10 NOTE — Anesthesia Preprocedure Evaluation (Addendum)
"                                    Anesthesia Evaluation  Patient identified by MRN, date of birth, ID band Patient awake    Reviewed: Allergy & Precautions, NPO status , Patient's Chart, lab work & pertinent test results  History of Anesthesia Complications Negative for: history of anesthetic complications  Airway Mallampati: II  TM Distance: >3 FB Neck ROM: Full    Dental no notable dental hx. (+) Teeth Intact   Pulmonary asthma , neg sleep apnea, neg COPD, Current Smoker and Patient abstained from smoking.   Pulmonary exam normal breath sounds clear to auscultation       Cardiovascular Exercise Tolerance: Good hypertension, Pt. on medications + CAD  (-) Past MI (-) dysrhythmias  Rhythm:Regular Rate:Normal - Systolic murmurs    Neuro/Psych  Headaches PSYCHIATRIC DISORDERS Anxiety Depression       GI/Hepatic ,GERD  ,,(+)     (-) substance abuse    Endo/Other  neg diabetes    Renal/GU      Musculoskeletal   Abdominal  (+) + obese  Peds  Hematology  (+) Blood dyscrasia, anemia thrombocytopenia   Anesthesia Other Findings Past Medical History: No date: allergic rhinitis No date: Anxiety No date: AR (allergic rhinitis) No date: Asthma No date: Depression No date: Endometriosis No date: GERD (gastroesophageal reflux disease) No date: H/O migraine No date: Herniated intervertebral disc No date: History of diverticulitis of colon No date: Hyperlipidemia No date: Hypertension No date: IBS (irritable bowel syndrome) No date: Irritable bowel syndrome No date: Nonobstructive CAD (coronary artery disease)     Comment:  a. 11/2019 Cor CTA: Cor Ca2+ = 141 (95th %'ile). LAD               25-49p. No date: Psoriasis No date: Tobacco abuse  Reproductive/Obstetrics                              Anesthesia Physical Anesthesia Plan  ASA: 2  Anesthesia Plan:    Post-op Pain Management: Regional block*   Induction:  Intravenous  PONV Risk Score and Plan: Dexamethasone  and Ondansetron   Airway Management Planned: Natural Airway  Additional Equipment:   Intra-op Plan:   Post-operative Plan:   Informed Consent: I have reviewed the patients History and Physical, chart, labs and discussed the procedure including the risks, benefits and alternatives for the proposed anesthesia with the patient or authorized representative who has indicated his/her understanding and acceptance.     Dental Advisory Given  Plan Discussed with: Anesthesiologist, CRNA and Surgeon  Anesthesia Plan Comments:         Anesthesia Quick Evaluation  "

## 2024-08-10 NOTE — Anesthesia Procedure Notes (Signed)
 Procedure Name: LMA Insertion Date/Time: 08/10/2024 4:48 PM  Performed by: Rosine Shona Jansky, CRNAPre-anesthesia Checklist: Patient identified, Emergency Drugs available, Suction available and Patient being monitored Patient Re-evaluated:Patient Re-evaluated prior to induction Oxygen Delivery Method: Circle system utilized Preoxygenation: Pre-oxygenation with 100% oxygen Induction Type: IV induction Ventilation: Mask ventilation without difficulty LMA: LMA inserted LMA Size: 3.0 Number of attempts: 1 Airway Equipment and Method: Stylet and Oral airway Placement Confirmation: positive ETCO2 and breath sounds checked- equal and bilateral Tube secured with: Tape Dental Injury: Teeth and Oropharynx as per pre-operative assessment

## 2024-08-10 NOTE — Anesthesia Procedure Notes (Signed)
 Anesthesia Regional Block: Adductor canal block   Pre-Anesthetic Checklist: , timeout performed,  Correct Patient, Correct Site, Correct Laterality,  Correct Procedure, Correct Position, site marked,  Risks and benefits discussed,  Surgical consent,  Pre-op evaluation,  At surgeon's request and post-op pain management  Laterality: Right  Prep: chloraprep       Needles:  Injection technique: Single-shot  Needle Type: Stimiplex     Needle Length: 9cm  Needle Gauge: 22     Additional Needles:   Procedures:,,,, ultrasound used (permanent image in chart),,    Narrative:  Start time: 08/10/2024 4:30 PM End time: 08/10/2024 4:31 PM Injection made incrementally with aspirations every 5 mL.  Performed by: Personally  Anesthesiologist: Vicci Camellia Glatter, MD  Additional Notes: Patient consented for risk and benefits of nerve block including but not limited to nerve damage, failed block, bleeding and infection.  Patient voiced understanding.  Functioning IV was confirmed and monitors were applied.  Timeout done prior to procedure and prior to any sedation being given to the patient.  Patient confirmed procedure site prior to any sedation given to the patient. Sterile prep,hand hygiene and sterile gloves were used.  Minimal sedation used for procedure.  No paresthesia endorsed by patient during the procedure.  Negative aspiration and negative test dose prior to incremental administration of local anesthetic. The patient tolerated the procedure well with no immediate complications.

## 2024-08-10 NOTE — Transfer of Care (Signed)
 Immediate Anesthesia Transfer of Care Note  Patient: Colleen Washington  Procedure(s) Performed: OPEN REDUCTION INTERNAL FIXATION (ORIF) ANKLE FRACTURE (Right: Ankle)  Patient Location: PACU  Anesthesia Type:General  Level of Consciousness: sedated  Airway & Oxygen Therapy: Patient Spontanous Breathing and Patient connected to face mask oxygen  Post-op Assessment: Report given to RN and Post -op Vital signs reviewed and stable  Post vital signs: Reviewed  Last Vitals:  Vitals Value Taken Time  BP 93/55 08/10/24 19:15  Temp    Pulse 85 08/10/24 19:18  Resp 16 08/10/24 19:18  SpO2 94 % 08/10/24 19:18  Vitals shown include unfiled device data.  Last Pain:      Patients Stated Pain Goal: 1 (08/09/24 2159)  Complications: No notable events documented.

## 2024-08-10 NOTE — H&P (Addendum)
 " History and Physical    Colleen Washington FMW:[id number] DOB: 23-Nov-1960 DOA: 08/09/2024  Referring MD/NP/PA:   PCP: Marylynn Verneita CROME, MD   Patient coming from:  The patient is coming from  who tell   Chief Complaint: fall and right ankle pain.  HPI: Colleen Washington is a 64 y.o. female with medical history significant of HTN, HLD, chronic bronchitis, CAD, depression with anxiety, obesity, psoriasis, migraine headaches, diverticulitis, IBS, SVT, depression, who presents with fall and right ankle pain.  Patient is from hotel.  She reports slipping in snow, falling, injured right ankle.  She developed pain in the right ankle which is constant, severe, sharp, nonradiating, aggravated by movement.  No leg numbness.  Patient strongly denies head or neck injury.  She does not want to do CT scan of head or neck.  No chest pain, cough, SOB.  No nausea, vomiting, diarrhea or abdominal pain.  No symptoms of UTI.  No fever or chills.  Data reviewed independently and ED Course: pt was found to have WBC 6.9, GFR> 60.  Temperature normal, blood pressure 125/69, heart rate 82, RR 18, oxygen saturation 100% on room air.  Patient is admitted to telemetry bed as inpatient.  Dr. Lennie for podiatry is consulted.  X-ray of right ankle: 1. Mildly displaced oblique fracture of the distal fibula and mildly displaced fracture of the medial malleolus, with widening of the medial clear space. 2. Soft tissue edema.   EKG: I have personally reviewed.  Sinus rhythm, QTc 443, low voltage, nonspecific T wave change.  Review of Systems:   General: no fevers, chills, no body weight gain, fatigue HEENT: no blurry vision, hearing changes or sore throat Respiratory: no dyspnea, coughing, wheezing CV: no chest pain, no palpitations GI: no nausea, vomiting, abdominal pain, diarrhea, constipation GU: no dysuria, burning on urination, increased urinary frequency, hematuria  Ext: no leg edema Neuro: no unilateral weakness,  numbness, or tingling, no vision change or hearing loss. Has fall Skin: has psoriasis rash in both feet MSK: has right ankle pain Heme: No easy bruising.  Travel history: No recent long distant travel.   Allergy: Allergies[1]  Past Medical History:  Diagnosis Date   allergic rhinitis    Anxiety    AR (allergic rhinitis)    Asthma    Bronchitis, chronic (HCC)    Depression    Dyspnea on exertion 10/22/2019   Endometriosis    GERD (gastroesophageal reflux disease)    H/O migraine    Herniated intervertebral disc    History of diverticulitis of colon    History of hiatal hernia    Hyperlipidemia    Hypertension    IBS (irritable bowel syndrome)    Irritable bowel syndrome    Menopause    Nonobstructive CAD (coronary artery disease)    a. 11/2019 Cor CTA: Cor Ca2+ = 141 (95th %'ile). LAD 25-49p.   Ovarian cyst, right 09/30/2015   PAC (premature atrial contraction) 10/22/2019   Polyp of transverse colon 05/22/2023   Psoriasis    SVT (supraventricular tachycardia) 08/15/2018   Thyroid  nodule 10/28/2016   Tobacco abuse    Vitamin D  deficiency 05/26/2015    Past Surgical History:  Procedure Laterality Date   ABDOMINAL HYSTERECTOMY  2003   [email or web address]   BREAST BIOPSY Left 08/2014   benign-BENIGN BREAST TISSUE WITH FIBROSIS   COLONOSCOPY WITH PROPOFOL  N/A 05/22/2023   Procedure: COLONOSCOPY WITH PROPOFOL ;  Surgeon: Jinny Carmine, MD;  Location: [facility];  Service: Endoscopy;  Laterality: N/A;   DILATION AND CURETTAGE OF UTERUS     ESOPHAGOGASTRODUODENOSCOPY (EGD) WITH PROPOFOL  N/A 05/22/2023   Procedure: ESOPHAGOGASTRODUODENOSCOPY (EGD) WITH PROPOFOL ;  Surgeon: Jinny Carmine, MD;  Location: [facility];  Service: Endoscopy;  Laterality: N/A;   POLYPECTOMY  05/22/2023   Procedure: POLYPECTOMY;  Surgeon: Jinny Carmine, MD;  Location: [facility];  Service: Endoscopy;;   XI ROBOTIC ASSISTED PARAESOPHAGEAL HERNIA REPAIR N/A 10/21/2023   Procedure: REPAIR, HERNIA, PARAESOPHAGEAL,  ROBOT-ASSISTED;  Surgeon: Jordis Laneta FALCON, MD;  Location: [facility] OR[facility];  Service: General;  Laterality: N/A;    Social History:  reports that she has been smoking cigarettes. She started smoking about 22 years ago. She has a 5 pack-year smoking history. She has been exposed to tobacco smoke. She has never used smokeless tobacco. She reports current alcohol use. She reports that she does not use drugs.  Family History:  Family History  Problem Relation Age of Onset   Arthritis Mother    Hyperlipidemia Mother    Cancer Father        colon   Diabetes Brother    Breast cancer Paternal Aunt 44   Arthritis Maternal Grandmother    Hyperlipidemia Maternal Grandfather    Heart disease Neg Hx    Colon cancer Neg Hx      Prior to Admission medications  Medication Sig Start Date End Date Taking? Authorizing Provider  albuterol  (VENTOLIN  HFA) 108 (90 Base) MCG/ACT inhaler Inhale 1 puff into the lungs every 6 (six) hours as needed for wheezing or shortness of breath. 03/22/24   Dineen Rollene MATSU, FNP  aspirin  EC 81 MG tablet Take 1 tablet (81 mg total) by mouth daily. Swallow whole. 01/26/24   End, Lonni, MD  buPROPion  (WELLBUTRIN  XL) 150 MG 24 hr tablet TAKE 1 TABLET BY MOUTH EVERY DAY 07/21/24   Marylynn Verneita CROME, MD  cetirizine  (ZYRTEC  ALLERGY) 10 MG tablet Take 1 tablet (10 mg total) by mouth daily. 09/15/22   Lavell Bari LABOR, FNP  fluticasone  (FLONASE ) 50 MCG/ACT nasal spray Place 2 sprays into both nostrils daily. 09/15/22   Lavell Bari LABOR, FNP  Multiple Vitamins-Minerals (ALIVE WOMENS 50+ GUMMY PO) Take 2 each by mouth daily.    [provider]  ondansetron  (ZOFRAN ) 4 MG tablet Take 1 tablet (4 mg total) by mouth every 8 (eight) hours as needed for nausea or vomiting. 03/17/24   Pabon, Diego F, MD  ondansetron  (ZOFRAN -ODT) 4 MG disintegrating tablet Take 1 tablet (4 mg total) by mouth every 8 (eight) hours as needed for nausea or vomiting. 02/19/24   Marylynn Verneita CROME, MD  promethazine   (PHENERGAN ) 12.5 MG tablet TAKE 1 TABLET (12.5 MG TOTAL) BY MOUTH EVERY 4 HOURS AS NEEDED FOR NAUSEA AND VOMITING 07/22/23   Marylynn Verneita CROME, MD  rosuvastatin  (CRESTOR ) 5 MG tablet Take 1 tablet (5 mg total) by mouth daily. 03/17/24 06/15/24  End, Lonni, MD  SKYRIZI  PEN 150 MG/ML pen ADMINISTER 1 INJECTION (150 MG/ML) UNDER THE SKIN EVERY 12 WEEKS AS DIRECTED FOR MAINTENANCE 05/24/24   Jackquline Sawyer, MD  Vitamin D -Vitamin K (VITAMIN K2-VITAMIN D3 PO) Take 1 tablet by mouth daily.    [provider]    Physical Exam: Vitals:   08/09/24 2321 08/09/24 2329 08/10/24 0000 08/10/24 0054  BP:  125/69 120/68 117/66  Pulse:  77 84 82  Resp:  18 18 17   Temp: 98 F (36.7 C)   97.7 F (36.5 C)  TempSrc: Oral     SpO2:  99% 98% 99%  Weight:      Height:       General: Not in acute distress HEENT:       Eyes: PERRL, EOMI, no jaundice       ENT: No discharge from the ears and nose, no pharynx injection, no tonsillar enlargement.        Neck: No JVD, no bruit, no mass felt. Heme: No neck lymph node enlargement. Cardiac: S1/S2, RRR, No murmurs, No gallops or rubs. Respiratory: No rales, wheezing, rhonchi or rubs. GI: Soft, nondistended, nontender, no rebound pain, no organomegaly, BS present. GU: No hematuria Ext: No pitting leg edema bilaterally. 1+DP/PT pulse bilaterally. Musculoskeletal:  has tenderness to right ankle Skin: Has psoriasis to both feet Neuro: Alert, oriented X3, cranial nerves II-XII grossly intact, moves all extremities.  Psych: Patient is not psychotic, no suicidal or hemocidal ideation.  Labs on Admission: I have personally reviewed following labs and imaging studies  CBC: Recent Labs  Lab 08/09/24 2212  WBC 6.9  NEUTROABS 3.8  HGB 12.4  HCT 36.8  MCV 87.2  PLT 164   Basic Metabolic Panel: Recent Labs  Lab 08/09/24 2212  NA 142  K 3.7  CL 109  CO2 21*  GLUCOSE 103*  BUN 11  CREATININE 0.72  CALCIUM  9.4   GFR: Estimated Creatinine  Clearance: 83.5 mL/min (by C-G formula based on SCr of 0.72 mg/dL). Liver Function Tests: Recent Labs  Lab 08/09/24 2212  AST 21  ALT 17  ALKPHOS 80  BILITOT 0.6  PROT 6.9  ALBUMIN 4.2   No results for input(s): LIPASE, AMYLASE in the last 168 hours. No results for input(s): AMMONIA in the last 168 hours. Coagulation Profile: Recent Labs  Lab 08/10/24 0018  INR 1.0   Cardiac Enzymes: No results for input(s): CKTOTAL, CKMB, CKMBINDEX, TROPONINI in the last 168 hours. BNP (last 3 results) No results for input(s): PROBNP in the last 8760 hours. HbA1C: No results for input(s): HGBA1C in the last 72 hours. CBG: No results for input(s): GLUCAP in the last 168 hours. Lipid Profile: No results for input(s): CHOL, HDL, LDLCALC, TRIG, CHOLHDL, LDLDIRECT in the last 72 hours. Thyroid  Function Tests: No results for input(s): TSH, T4TOTAL, FREET4, T3FREE, THYROIDAB in the last 72 hours. Anemia Panel: No results for input(s): VITAMINB12, FOLATE, FERRITIN, TIBC, IRON, RETICCTPCT in the last 72 hours. Urine analysis:    Component Value Date/Time   COLORURINE YELLOW (A) 03/11/2024 1133   APPEARANCEUR CLEAR (A) 03/11/2024 1133   LABSPEC 1.011 03/11/2024 1133   PHURINE 6.0 03/11/2024 1133   GLUCOSEU NEGATIVE 03/11/2024 1133   GLUCOSEU NEGATIVE 03/09/2014 1405   HGBUR NEGATIVE 03/11/2024 1133   BILIRUBINUR NEGATIVE 03/11/2024 1133   BILIRUBINUR negative 02/03/2023 1439   BILIRUBINUR neg 12/25/2016 1443   KETONESUR NEGATIVE 03/11/2024 1133   PROTEINUR NEGATIVE 03/11/2024 1133   UROBILINOGEN 0.2 02/03/2023 1439   UROBILINOGEN 1.0 03/09/2014 1405   NITRITE NEGATIVE 03/11/2024 1133   LEUKOCYTESUR NEGATIVE 03/11/2024 1133   Sepsis Labs: @LABRCNTIP (procalcitonin:4,lacticidven:4) )No results found for this or any previous visit (from the past 240 hours).   Radiological Exams on Admission:   Assessment/Plan Principal Problem:    Closed right ankle fracture Active Problems:   Fall at home, initial encounter   Coronary artery disease involving native coronary artery of native heart without angina pectoris   HTN (hypertension)   Hyperlipidemia   Bronchitis, chronic (HCC)   SVT (supraventricular tachycardia)   Psoriasis   GERD (gastroesophageal reflux disease)  Tobacco abuse   Obesity (BMI 30.0-34.9)   Depression   Assessment and Plan:   Closed right ankle fracture: X-ray showed mildly displaced oblique fracture of the distal fibula and mildly displaced  fracture of the medial malleolus, with widening of the medial clear space.  Patient has moderate pain now. No neurovascular compromise.  Dr. Lennie for podiatry was consulted.   - will admit to tele bed as inpt - Pain control: prn Dilaudid  percocet and tyleno - When necessary Zofran  for nausea - Robaxin  for muscle spasm - Lidoderm  patch for pain - type and cross - INR/PTT -Splint is applied  Fall at home, initial encounter: -PT/OT when able to (not ordered now)  Coronary artery disease involving native coronary artery of native heart without angina pectoris: No chest pain. - Aspirin  and Crestor  (Skip aspirin  dose on 1/27 for surgery)  HTN (hypertension): Blood pressure 125/69.  Patient is not taking medications currently. -IV hydralazine  as needed  Hyperlipidemia -Crestor   Bronchitis, chronic (HCC): Stable, no wheezing -Bronchodilators and prn Mucinex   SVT (supraventricular tachycardia): Heart rate 80s. -Telemetry monitoring  Psoriasis -Patient is still receiving Skyrizi  injection every 12 weeks.  GERD (gastroesophageal reflux disease) -Protonix   Tobacco abuse - Did counseling about importance of quitting smoking - Nicotine  patch   Obesity (BMI 30.0-34.9): Patient has Obesity Class I, with body weight 88.9 Kg and BMI 30.70 kg/m2.  - Encourage losing weight - Exercise and healthy diet  Depression: - Wellbutrin    Perioperative  Cardiac Risk: pt has multiple comorbidities as listed above. Currently patient is active and independent of ADLs and, IADLs.  She has history of SVT and nonobstructive CAD.  Today heart rate is 80s.  No chest pain. No recent acute cardiac issues.  EKG has no acute change. At this time point, no further work up is needed. Patient's GUPTA score perioperative myocardial infarction or cardaic arrest is 0.41 %, which is low risk. I discussed with patient, pt would like to proceed for surgery.      DVT ppx: SCD  Code Status: Full code  Family Communication:     not done, no family member is at bed side.     Disposition Plan:  to be determined, may need a rehab  Consults called: Dr. Lennie of podiatry  Admission status and Level of care: Telemetry:   as inpt        Dispo: The patient is from: [facility]              Anticipated d/c is to: To be determined, may need rehab              Anticipated d/c date is: 2 days              Patient currently is not medically stable to d/c.    Severity of Illness:  The appropriate patient status for this patient is INPATIENT. Inpatient status is judged to be reasonable and necessary in order to provide the required intensity of service to ensure the patient's safety. The patient's presenting symptoms, physical exam findings, and initial radiographic and laboratory data in the context of their chronic comorbidities is felt to place them at high risk for further clinical deterioration. Furthermore, it is not anticipated that the patient will be medically stable for discharge from the hospital within 2 midnights of admission.   * I certify that at the point of admission it is my clinical judgment that the patient will require inpatient hospital care spanning beyond 2 midnights  from the point of admission due to high intensity of service, high risk for further deterioration and high frequency of surveillance required.*       Date of Service 08/10/2024     Caleb Exon [facility]   If 7PM-7AM, please contact night-coverage [email or web address] 08/10/2024, 3:02 AM     [1]  Allergies Allergen Reactions   Compazine [Prochlorperazine] Swelling   Prochlorperazine Edisylate    Prochlorperazine Maleate    "

## 2024-08-10 NOTE — Op Note (Signed)
 PODIATRY / FOOT AND ANKLE SURGERY OPERATIVE REPORT    SURGEON: Prentice Lee, DPM  PRE-OPERATIVE DIAGNOSIS:  1.  Right trimalleolar ankle fracture 2.  Right ankle syndesmotic disruption  POST-OPERATIVE DIAGNOSIS: Same  PROCEDURE(S): Right trimalleolar ankle fracture open reduction with internal fixation Right syndesmotic ligament repair Use of intraoperative fluoroscopy without radiology assistance  HEMOSTASIS: Right thigh tourniquet  ANESTHESIA: general  ESTIMATED BLOOD LOSS: 20 cc  FINDING(S): 1.  Oblique fracture to the distal fibula 2.  Small medial malleolar fracture  PATHOLOGY/SPECIMEN(S): None  INDICATIONS:   Colleen Washington is a 64 y.o. female who presents with right ankle injury after sustaining a fall and ice.  She went to the emergency room was diagnosed with a displaced ankle fracture.  All treatment options were discussed with the patient of both conservative and surgical attempts at correction including potential risks and complications.  Patient has elected for procedure consisting of right trimalleolar ankle fracture open reduction with internal fixation with syndesmotic ligament repair.  No guarantees given.  Consent obtained. .  DESCRIPTION: After obtaining full informed written consent, the patient was brought back to the operating room and placed supine upon the operating table.  The patient received IV antibiotics prior to induction.  After obtaining adequate anesthesia, the patient was prepped and draped in the standard fashion.  An Esmarch bandage was used to exsanguinate the right lower extremity and pneumatic thigh tourniquet was inflated.  A preoperative popliteal and saphenous nerve block was performed by anesthesia.  C-arm was utilized to verify position of incision placement over the distal fibular fracture area.  The incision was made and deepened to the subcutaneous tissues utilizing sharp and blunt dissection and care was taken to identify and retract  all vital neurovascular structures and all venous contributories were cauterized as necessary.  At this time an incision was then made into the periosteum overlying the distal fibula and the periosteum was reflected anteriorly and posteriorly thereby exposing the distal fibula and lateral malleolus at the operative site, the fracture was easily able to be visualized with hematoma present to the area.  The hematoma was removed and the fracture was visualized.  The fracture was then reduced into near anatomic position keeping the fracture fragments in the appropriate position.  This was done with a lobster claw reduction clamps.  Once in good placement C-arm imaging was utilized to verify correction which appeared to be excellent, the fibula appeared to be out to length and in anatomic position.  The medial clear space appeared to be close down after this.  At this time a Paragon 28 3.5 mm solid core nonlocking screw was placed with the appropriate orientation utilizing standard AO principles and techniques perpendicular to the fracture site.  This appeared to have good compression overall.  The fracture clamps were still held into place but the compression appeared to be excellent across the fracture site.  At this time an 11 hole Paragon 28 plate was then placed and temporary fixated with all of wires.  C-arm imaging was utilized to verify correction of plate which appeared to be the appropriate placement on the AP and lateral views.  At this time 4 locking screws were placed proximally and 4 locking screws were placed distal to the fracture site utilizing standard AO principles and techniques.  One of the distal screw spots was left open slightly distal to the fracture for possible syndesmotic fixation.  Attention was then directed to the medial malleolar area where an incision was  made parallel to the great saphenous vein.  The incision was deepened to the subcutaneous tissues utilizing sharp and blunt  dissection and care was taken to identify and retract all vital neurovascular structures and all venous contributories were cauterized necessary.  At this time the periosteum was reflected away from the fracture sites thereby exposing the fracture at the area.  It appeared to be a somewhat smaller fracture but still large enough to hold 2 screws for classic fixation.  At this time the ankle joint was cleared out of any debris and hematoma was removed.  The fracture was able to be reduced easily and keyed into position.  2 guidewires for 4.0 cannulated partially-threaded Paragon 28 screws were then placed with the appropriate orientation.  This was checked under fluoroscopic guidance and the ankle appeared to have near anatomic alignment overall.  At this time utilizing standard AO principles and techniques to 4.0 x 44 mm partially-threaded cannulated Paragon 28 screws were then placed across the fracture site with good compression.  The temporary wires were removed and passed off the operative site.  The ankle appeared to be sitting in anatomic position overall after this was performed.  Cotton hook test was then performed of the lateral malleolus and noted to have some slight gapping at the tib-fib area with slight increase in medial clear space.  Appeared to be much well reduced overall compared to preop.  At this time it was determined to perform syndesmotic repair.  Attention was directed to the lateral ankle once again where the distal screw hole that was left open slightly distal to the fracture site but above the level of the ankle joint was used for syndesmotic fixation.  This hole was then used to fill the area with the reflex anchor system from Paragon 28.  Using standard manufactures protocol the reflex anchor was placed.  Tightening was performed of the reflex anchor bringing the tib-fib overlap into improved position.  The purple seated instrumentation appeared to sit well into the hole into the  fibula.  C-arm imaging was utilized to verify correction and placement of implant which appeared to be excellent.  The suture ends were then cut off of the purple implant after sufficient fixation was obtained.  Final C-arm imaging was then taken showing anatomic reduction of the ankle fracture with the appropriate size of plate and screws.  Syndesmotic fixation also visualized.  The surgical sites were flushed with copious amounts normal sterile saline.  The periosteal and capsular structures then reapproximated well coapted with 3-0 Vicryl.  The subcutaneous tissue was reapproximated well coapted through Vicryl.  The skin was then reapproximated well coapted with combination of skin staples and 3-0 nylon.  The pneumatic thigh tourniquet was deflated and a prompt hyperemic response was noted all digits of the right ankle/foot.  Hemostasis appeared to be well achieved.  A postoperative dressing was then applied consisting of Xeroform followed by 4 x 4 gauze, ABD pads, Kerlix, Webril, posterior splint, Ace wrap's.  The patient tolerated the procedure and anesthesia well and was transferred to recovery with vital signs stable vascular status intact all toes of the right foot.  Following a period of postoperative monitoring the patient be discharged home with the appropriate orders, instructions, and medications.  COMPLICATIONS: None  CONDITION: Good, stable  Prentice Lee, DPM

## 2024-08-10 NOTE — Progress Notes (Signed)
 " PROGRESS NOTE    TRENELL MOXEY  FMW:[id number] DOB: 1961/03/13 DOA: 08/09/2024 PCP: Marylynn Verneita CROME, MD  Subjective: No acute events overnight. Seen and examined at bedside with husband present. Reports pain is controlled but having nausea. Has not eaten yet in anticipation of podiatry evaluation for possible surgical intervention. Denies vomiting or constipation.   Hospital Course:  64 y.o. female with medical history significant of HTN, HLD, chronic bronchitis, CAD, depression with anxiety, obesity, psoriasis, migraine headaches, diverticulitis, IBS, SVT, depression, who presents with fall and right ankle pain.   Patient is from hotel.  She reports slipping in snow, falling, injured right ankle.  She developed pain in the right ankle which is constant, severe, sharp, nonradiating, aggravated by movement.  No leg numbness.  Patient strongly denies head or neck injury.  She does not want to do CT scan of head or neck.  No chest pain, cough, SOB.  No nausea, vomiting, diarrhea or abdominal pain.  No symptoms of UTI.  No fever or chills.   Assessment and Plan:  Acute Closed right ankle fracture: X-ray showed mildly displaced oblique fracture of the distal fibula and mildly displaced  fracture of the medial malleolus, with widening of the medial clear space.  Patient has moderate pain now. No neurovascular compromise.  Dr. Lennie for podiatry was consulted.  - stop IV dilaudid  prn. Avoid IV pain meds if able to take PO well - use percocet PRN for moderate to severe pain  - cont tylenol  PRN - When necessary Zofran  for nausea - Robaxin  for muscle spasm - Lidoderm  patch for pain -Splint is applied - podiatry consult pending   Nausea - maybe related to IV pain medications - did not respond much to zofran  PRN - pain control as elsewhere - will try reglan  - monitor clinically  Fall at home, initial encounter: -PT/OT when able to after podiatry evaluation with mobility recommendations    Coronary artery disease involving native coronary artery of native heart without angina pectoris: No chest pain. - Aspirin  and Crestor  (Skip aspirin  dose on 1/27 for surgery)   HTN (hypertension): Blood pressure 125/69.  Patient is not taking medications currently. -IV hydralazine  as needed   Hyperlipidemia -Crestor    Bronchitis, chronic (HCC): Stable, no wheezing -Bronchodilators and prn Mucinex    SVT (supraventricular tachycardia): Heart rate 80s. -Telemetry monitoring   Psoriasis -Patient is still receiving Skyrizi  injection every 12 weeks.   GERD (gastroesophageal reflux disease) -Protonix    Tobacco abuse - counseled by admitting provider on importance of quitting smoking - Nicotine  patch  DVT prophylaxis: SCDs Start: 08/10/24 0015  SCDs. Consider adding lovenox  if no surgery planned for today    Code Status: Full Code Family Communication: updated husband at bedside Disposition Plan: TBD Reason for continuing need for hospitalization: severity of illness  Objective: Vitals:   08/10/24 0000 08/10/24 0054 08/10/24 0352 08/10/24 0803  BP: 120/68 117/66 111/78 120/80  Pulse: 84 82 73 74  Resp: 18 17 18 16   Temp:  97.7 F (36.5 C) 97.6 F (36.4 C) (!) 97.4 F (36.3 C)  TempSrc:      SpO2: 98% 99% 100% 99%  Weight:      Height:        Intake/Output Summary (Last 24 hours) at 08/10/2024 0913 Last data filed at 08/10/2024 0631 Gross per 24 hour  Intake 432.89 ml  Output --  Net 432.89 ml   Filed Weights   08/09/24 1838  Weight: 88.9 kg  Examination:  Physical Exam Vitals and nursing note reviewed.  Constitutional:      General: She is not in acute distress.    Appearance: She is ill-appearing.  HENT:     Head: Normocephalic and atraumatic.  Cardiovascular:     Rate and Rhythm: Normal rate and regular rhythm.     Pulses: Normal pulses.     Heart sounds: Normal heart sounds.  Pulmonary:     Effort: Pulmonary effort is normal. No respiratory  distress.     Breath sounds: Normal breath sounds. No wheezing.  Abdominal:     General: Bowel sounds are normal. There is no distension.     Palpations: Abdomen is soft.     Tenderness: There is no abdominal tenderness.  Musculoskeletal:     Comments: R foot fracture site covered in dressing clean/dry/intact  Neurological:     Mental Status: She is alert. Mental status is at baseline.     Data Reviewed: I have personally reviewed following labs and imaging studies  CBC: Recent Labs  Lab 08/09/24 2212 08/10/24 0507  WBC 6.9 6.7  NEUTROABS 3.8  --   HGB 12.4 11.5*  HCT 36.8 35.5*  MCV 87.2 89.4  PLT 164 147*   Basic Metabolic Panel: Recent Labs  Lab 08/09/24 2212 08/10/24 0507  NA 142 141  K 3.7 3.6  CL 109 107  CO2 21* 26  GLUCOSE 103* 113*  BUN 11 11  CREATININE 0.72 0.75  CALCIUM  9.4 9.1   GFR: Estimated Creatinine Clearance: 83.5 mL/min (by C-G formula based on SCr of 0.75 mg/dL). Liver Function Tests: Recent Labs  Lab 08/09/24 2212  AST 21  ALT 17  ALKPHOS 80  BILITOT 0.6  PROT 6.9  ALBUMIN 4.2   No results for input(s): LIPASE, AMYLASE in the last 168 hours. No results for input(s): AMMONIA in the last 168 hours. Coagulation Profile: Recent Labs  Lab 08/10/24 0018  INR 1.0   Cardiac Enzymes: No results for input(s): CKTOTAL, CKMB, CKMBINDEX, TROPONINI in the last 168 hours. ProBNP, BNP (last 5 results) No results for input(s): PROBNP, BNP in the last 8760 hours. HbA1C: No results for input(s): HGBA1C in the last 72 hours. CBG: No results for input(s): GLUCAP in the last 168 hours. Lipid Profile: No results for input(s): CHOL, HDL, LDLCALC, TRIG, CHOLHDL, LDLDIRECT in the last 72 hours. Thyroid  Function Tests: No results for input(s): TSH, T4TOTAL, FREET4, T3FREE, THYROIDAB in the last 72 hours. Anemia Panel: No results for input(s): VITAMINB12, FOLATE, FERRITIN, TIBC, IRON,  RETICCTPCT in the last 72 hours. Sepsis Labs: No results for input(s): PROCALCITON, LATICACIDVEN in the last 168 hours.  Recent Results (from the past 240 hours)  MRSA Next Gen by PCR, Nasal     Status: None   Collection Time: 08/10/24  1:57 AM   Specimen: Nasal Mucosa; Nasal Swab  Result Value Ref Range Status   MRSA by PCR Next Gen NOT DETECTED NOT DETECTED Final    Comment: (NOTE) The GeneXpert MRSA Assay (FDA approved for NASAL specimens only), is one component of a comprehensive MRSA colonization surveillance program. It is not intended to diagnose MRSA infection nor to guide or monitor treatment for MRSA infections. Test performance is not FDA approved in patients less than 25 years old. Performed at [facility], [address] [place], [place] [address]      Radiology Studies: DG Ankle Complete Right Result Date: 08/09/2024 EXAM: 3 OR MORE VIEW(S) XRAY OF THE RIGHT ANKLE 08/09/2024  07:06:00 PM CLINICAL HISTORY: Patient fell. COMPARISON: None available. FINDINGS: BONES AND JOINTS: Mildly displaced oblique fracture of distal fibula. Mildly displaced fracture of medial malleolus. Widening of medial clear space. SOFT TISSUES: Soft tissue edema. IMPRESSION: 1. Mildly displaced oblique fracture of the distal fibula and mildly displaced fracture of the medial malleolus, with widening of the medial clear space. 2. Soft tissue edema. Electronically signed by: Oneil Devonshire MD 08/09/2024 07:20 PM EST RP Workstation: [id number]    Scheduled Meds:  [START ON 08/11/2024] aspirin  EC  81 mg Oral Daily   buPROPion   150 mg Oral QHS   metoCLOPramide   5 mg Oral Once   nicotine   21 mg Transdermal Daily   pantoprazole   40 mg Oral Q1200   rosuvastatin   5 mg Oral Daily   Continuous Infusions:  sodium chloride  75 mL/hr at 08/10/24 0631     LOS: 1 day   Norval Bar, MD  [facility]  08/10/2024, 9:13 AM   "

## 2024-08-10 NOTE — Plan of Care (Signed)
  Problem: Education: Goal: Knowledge of General Education information will improve Description: Including pain rating scale, medication(s)/side effects and non-pharmacologic comfort measures Outcome: Progressing   Problem: Health Behavior/Discharge Planning: Goal: Ability to manage health-related needs will improve Outcome: Progressing   Problem: Clinical Measurements: Goal: Will remain free from infection Outcome: Progressing   Problem: Clinical Measurements: Goal: Diagnostic test results will improve Outcome: Progressing   Problem: Activity: Goal: Risk for activity intolerance will decrease Outcome: Progressing

## 2024-08-10 NOTE — Consult Note (Signed)
 " ORTHOPAEDIC CONSULTATION  REQUESTING PHYSICIAN: Cosette Blackwater, MD  Chief Complaint: Right ankle fracture  HPI: Colleen Washington is a 64 y.o. female who complains of displaced right ankle fracture.  Fell yesterday on her way to work.  She works here at the hospital.  X-ray showed a displaced trimalleolar ankle fracture.  Planning for surgery later this afternoon.  Patient's been n.p.o. since last night.  Denies any other injury.  She does have a history of smoking.  She states she smokes a pack a week.  Past Medical History:  Diagnosis Date   allergic rhinitis    Anxiety    AR (allergic rhinitis)    Asthma    Bronchitis, chronic (HCC)    Depression    Dyspnea on exertion 10/22/2019   Endometriosis    GERD (gastroesophageal reflux disease)    H/O migraine    Herniated intervertebral disc    History of diverticulitis of colon    History of hiatal hernia    Hyperlipidemia    Hypertension    IBS (irritable bowel syndrome)    Irritable bowel syndrome    Menopause    Nonobstructive CAD (coronary artery disease)    a. 11/2019 Cor CTA: Cor Ca2+ = 141 (95th %'ile). LAD 25-49p.   Ovarian cyst, right 09/30/2015   PAC (premature atrial contraction) 10/22/2019   Polyp of transverse colon 05/22/2023   Psoriasis    SVT (supraventricular tachycardia) 08/15/2018   Thyroid  nodule 10/28/2016   Tobacco abuse    Vitamin D  deficiency 05/26/2015   Past Surgical History:  Procedure Laterality Date   ABDOMINAL HYSTERECTOMY  2003   [id number]   BREAST BIOPSY Left 08/2014   benign-BENIGN BREAST TISSUE WITH FIBROSIS   COLONOSCOPY WITH PROPOFOL  N/A 05/22/2023   Procedure: COLONOSCOPY WITH PROPOFOL ;  Surgeon: Jinny Carmine, MD;  Location: [facility] END[facility]COPY;  Service: Endoscopy;  Laterality: N/A;   DILATION AND CURETTAGE OF UTERUS     ESOPHAGOGASTRODUODENOSCOPY (EGD) WITH PROPOFOL  N/A 05/22/2023   Procedure: ESOPHAGOGASTRODUODENOSCOPY (EGD) WITH PROPOFOL ;  Surgeon: Jinny Carmine, MD;  Location: [facility] ENDOSCOPY;   Service: Endoscopy;  Laterality: N/A;   POLYPECTOMY  05/22/2023   Procedure: POLYPECTOMY;  Surgeon: Jinny Carmine, MD;  Location: [facility] ENDOSCOPY;  Service: Endoscopy;;   XI ROBOTIC ASSISTED PARAESOPHAGEAL HERNIA REPAIR N/A 10/21/2023   Procedure: REPAIR, HERNIA, PARAESOPHAGEAL, ROBOT-ASSISTED;  Surgeon: Jordis Laneta FALCON, MD;  Location: [facility] ORS;  Service: General;  Laterality: N/A;   Social History   Socioeconomic History   Marital status: Married    Spouse name: Nancyann   Number of children: Not on file   Years of education: Not on file   Highest education level: Associate degree: occupational, scientist, product/process development, or vocational program  Occupational History   Not on file  Tobacco Use   Smoking status: Every Day    Current packs/day: 0.00    Average packs/day: 0.3 packs/day for 20.0 years (5.0 ttl pk-yrs)    Types: Cigarettes    Start date: 11/14/2001    Last attempt to quit: 11/14/2021    Years since quitting: 2.7    Passive exposure: Past   Smokeless tobacco: Never   Tobacco comments:       Vaping Use   Vaping status: Never Used  Substance and Sexual Activity   Alcohol use: Yes    Alcohol/week: 0.0 standard drinks of alcohol    Comment: occas   Drug use: No   Sexual activity: Yes    Birth control/protection: Surgical  Other Topics Concern  Not on file  Social History Narrative   Not on file   Social Drivers of Health   Tobacco Use: High Risk (08/09/2024)   Patient History    Smoking Tobacco Use: Every Day    Smokeless Tobacco Use: Never    Passive Exposure: Past  Financial Resource Strain: Low Risk (02/19/2024)   Overall Financial Resource Strain (CARDIA)    Difficulty of Paying Living Expenses: Not hard at all  Food Insecurity: No Food Insecurity (08/10/2024)   Epic    Worried About Radiation Protection Practitioner of Food in the Last Year: Never true    Ran Out of Food in the Last Year: Never true  Transportation Needs: No Transportation Needs (08/10/2024)   Epic    Lack of Transportation (Medical):  No    Lack of Transportation (Non-Medical): No  Physical Activity: Insufficiently Active (02/19/2024)   Exercise Vital Sign    Days of Exercise per Week: 3 days    Minutes of Exercise per Session: 30 min  Stress: No Stress Concern Present (02/19/2024)   [employer] of Occupational Health - Occupational Stress Questionnaire    Feeling of Stress: Not at all  Social Connections: Socially Integrated (08/10/2024)   Social Connection and Isolation Panel    Frequency of Communication with Friends and Family: More than three times a week    Frequency of Social Gatherings with Friends and Family: Once a week    Attends Religious Services: 1 to 4 times per year    Active Member of Clubs or Organizations: Yes    Attends Banker Meetings: 1 to 4 times per year    Marital Status: Married  Depression (PHQ2-9): Low Risk (04/14/2024)   Depression (PHQ2-9)    PHQ-2 Score: 0  Alcohol Screen: Low Risk (02/19/2024)   Alcohol Screen    Last Alcohol Screening Score (AUDIT): 1  Housing: Low Risk (08/10/2024)   Epic    Unable to Pay for Housing in the Last Year: No    Number of Times Moved in the Last Year: 0    Homeless in the Last Year: No  Utilities: Not At Risk (08/10/2024)   Epic    Threatened with loss of utilities: No  Health Literacy: Not on file   Family History  Problem Relation Age of Onset   Arthritis Mother    Hyperlipidemia Mother    Cancer Father        colon   Diabetes Brother    Breast cancer Paternal Aunt 56   Arthritis Maternal Grandmother    Hyperlipidemia Maternal Grandfather    Heart disease Neg Hx    Colon cancer Neg Hx    Allergies[1] Prior to Admission medications  Medication Sig Start Date End Date Taking? Authorizing Provider  albuterol  (VENTOLIN  HFA) 108 (90 Base) MCG/ACT inhaler Inhale 1 puff into the lungs every 6 (six) hours as needed for wheezing or shortness of breath. 03/22/24  Yes Dineen Rollene MATSU, FNP  aspirin  EC 81 MG tablet Take 1 tablet (81 mg  total) by mouth daily. Swallow whole. 01/26/24  Yes End, Lonni, MD  buPROPion  (WELLBUTRIN  XL) 150 MG 24 hr tablet TAKE 1 TABLET BY MOUTH EVERY DAY Patient taking differently: Take 150 mg by mouth at bedtime. 07/21/24  Yes Marylynn Verneita CROME, MD  rosuvastatin  (CRESTOR ) 5 MG tablet Take 1 tablet (5 mg total) by mouth daily. 03/17/24 08/10/24 Yes End, Lonni, MD  SKYRIZI  PEN 150 MG/ML pen ADMINISTER 1 INJECTION (150 MG/ML) UNDER THE SKIN EVERY 12  WEEKS AS DIRECTED FOR MAINTENANCE 05/24/24  Yes Jackquline Sawyer, MD  cetirizine  (ZYRTEC  ALLERGY) 10 MG tablet Take 1 tablet (10 mg total) by mouth daily. Patient not taking: Reported on 08/10/2024 09/15/22   Lavell Lye A, FNP  fluticasone  (FLONASE ) 50 MCG/ACT nasal spray Place 2 sprays into both nostrils daily. Patient not taking: Reported on 08/10/2024 09/15/22   Lavell Lye LABOR, FNP  Multiple Vitamins-Minerals (ALIVE WOMENS 50+ GUMMY PO) Take 2 each by mouth daily. Patient not taking: Reported on 08/10/2024    [provider]  ondansetron  (ZOFRAN ) 4 MG tablet Take 1 tablet (4 mg total) by mouth every 8 (eight) hours as needed for nausea or vomiting. Patient not taking: Reported on 08/10/2024 03/17/24   Jordis Cliche F, MD  ondansetron  (ZOFRAN -ODT) 4 MG disintegrating tablet Take 1 tablet (4 mg total) by mouth every 8 (eight) hours as needed for nausea or vomiting. Patient not taking: Reported on 08/10/2024 02/19/24   Marylynn Verneita CROME, MD  promethazine  (PHENERGAN ) 12.5 MG tablet TAKE 1 TABLET (12.5 MG TOTAL) BY MOUTH EVERY 4 HOURS AS NEEDED FOR NAUSEA AND VOMITING Patient not taking: Reported on 08/10/2024 07/22/23   Marylynn Verneita CROME, MD  Vitamin D -Vitamin K (VITAMIN K2-VITAMIN D3 PO) Take 1 tablet by mouth daily. Patient not taking: Reported on 08/10/2024    [provider]   DG Ankle Complete Right Result Date: 08/09/2024 EXAM: 3 OR MORE VIEW(S) XRAY OF THE RIGHT ANKLE 08/09/2024 07:06:00 PM CLINICAL HISTORY: Patient fell. COMPARISON: None available.  FINDINGS: BONES AND JOINTS: Mildly displaced oblique fracture of distal fibula. Mildly displaced fracture of medial malleolus. Widening of medial clear space. SOFT TISSUES: Soft tissue edema. IMPRESSION: 1. Mildly displaced oblique fracture of the distal fibula and mildly displaced fracture of the medial malleolus, with widening of the medial clear space. 2. Soft tissue edema. Electronically signed by: Oneil Devonshire MD 08/09/2024 07:20 PM EST RP Workstation: [id number]    Positive ROS: All other systems have been reviewed and were otherwise negative with the exception of those mentioned in the HPI and as above.  12 point ROS was performed.  Physical Exam: General: Alert and oriented.  No apparent distress.  Vascular:  Left foot:Not evaluated  Right foot: Patient in splint  Neuro:intact  Derm: Patient in splint.  Splint left intact  Ortho/MS: Right lower extremity splinted.  I reviewed her x-rays with the patient as well as the family.  We discussed the displacement of the ankle fracture.  It is a bimalleolar ankle fracture with a small posterior fracture as well.  Assessment: Trimalleolar ankle fracture right ankle  Plan: Plan is for ORIF this evening.  She has been NPO.  Orders have been placed by Dr. Lennie who will perform the surgery.  We discussed the risk benefits alternatives complications associated with surgery.  We discussed the displacement and instability and the need for the surgical reduction.  I specifically outlined the risks associated with smoking and discontinuing smoking in the perioperative period and she expressed understanding.  She recently had surgery and underwent anesthesia without complication.  Does have a history of sleep apnea.  Surgery is scheduled for late this afternoon.    Ashley Eva LABOR, DPM Cell [phone]   08/10/2024 12:06 PM      [1]  Allergies Allergen Reactions   Compazine [Prochlorperazine] Swelling   Prochlorperazine Edisylate     Prochlorperazine Maleate    "

## 2024-08-10 NOTE — Anesthesia Procedure Notes (Signed)
 Anesthesia Regional Block: Popliteal block   Pre-Anesthetic Checklist: , timeout performed,  Correct Patient, Correct Site, Correct Laterality,  Correct Procedure, Correct Position, site marked,  Risks and benefits discussed,  Surgical consent,  Pre-op evaluation,  At surgeon's request and post-op pain management  Laterality: Right  Prep: chloraprep       Needles:  Injection technique: Single-shot  Needle Type: Stimiplex     Needle Length: 9cm  Needle Gauge: 22     Additional Needles:   Procedures:,,,, ultrasound used (permanent image in chart),,    Narrative:  Start time: 08/10/2024 4:33 PM End time: 08/10/2024 4:45 PM Injection made incrementally with aspirations every 5 mL.  Performed by: Personally  Anesthesiologist: Vicci Camellia Glatter, MD  Additional Notes: Patient consented for risk and benefits of nerve block including but not limited to nerve damage, failed block, bleeding and infection.  Patient voiced understanding.  Functioning IV was confirmed and monitors were applied.  Timeout done prior to procedure and prior to any sedation being given to the patient.  Patient confirmed procedure site prior to any sedation given to the patient. Sterile prep,hand hygiene and sterile gloves were used.  Minimal sedation used for procedure.  No paresthesia endorsed by patient during the procedure.  Negative aspiration and negative test dose prior to incremental administration of local anesthetic. The patient tolerated the procedure well with no immediate complications.

## 2024-08-10 NOTE — Plan of Care (Signed)

## 2024-08-10 NOTE — H&P (Signed)
 HISTORY AND PHYSICAL INTERVAL NOTE:  08/10/2024  4:14 PM  Colleen Washington  has presented today for surgery, with the diagnosis of ankle fracture.  The various methods of treatment have been discussed with the patient.  No guarantees were given.  After consideration of risks, benefits and other options for treatment, the patient has consented to surgery.  I have reviewed the patients chart and labs.    PROCEDURE: RIGHT TRIMAL ANKLE FRACTURE ORIF POSSIBLE SYNDESMOTIC LIGAMENT REPAIR USE OF INTRA-OP FLUOROSCOPY   A history and physical examination was performed in the hospital.  The patient was reexamined.  There have been no changes to this history and physical examination.  Prentice Lee, DPM

## 2024-08-11 ENCOUNTER — Telehealth: Payer: Self-pay

## 2024-08-11 ENCOUNTER — Other Ambulatory Visit: Payer: Self-pay

## 2024-08-11 MED ORDER — ADULT MULTIVITAMIN W/MINERALS CH
1.0000 | ORAL_TABLET | Freq: Every day | ORAL | Status: DC
  Administered 2024-08-11: 1 via ORAL
  Filled 2024-08-11: qty 1

## 2024-08-11 MED ORDER — ROSUVASTATIN CALCIUM 5 MG PO TABS
5.0000 mg | ORAL_TABLET | Freq: Every day | ORAL | 3 refills | Status: AC
  Filled 2024-08-11: qty 30, 30d supply, fill #0

## 2024-08-11 MED ORDER — OXYCODONE-ACETAMINOPHEN 5-325 MG PO TABS: 1.0000 | ORAL_TABLET | ORAL | 0 refills | Status: AC | PRN

## 2024-08-11 MED ORDER — METHOCARBAMOL 500 MG PO TABS
500.0000 mg | ORAL_TABLET | Freq: Three times a day (TID) | ORAL | 0 refills | Status: AC | PRN
  Filled 2024-08-11: qty 20, 7d supply, fill #0

## 2024-08-11 MED ORDER — SENNOSIDES-DOCUSATE SODIUM 8.6-50 MG PO TABS
1.0000 | ORAL_TABLET | Freq: Every evening | ORAL | 0 refills | Status: AC | PRN
  Filled 2024-08-11: qty 30, 30d supply, fill #0

## 2024-08-11 MED ORDER — ASPIRIN 81 MG PO TBEC
81.0000 mg | DELAYED_RELEASE_TABLET | Freq: Every day | ORAL | 0 refills | Status: AC
  Filled 2024-08-11: qty 30, 30d supply, fill #0

## 2024-08-11 MED ORDER — AMOXICILLIN-POT CLAVULANATE 875-125 MG PO TABS
1.0000 | ORAL_TABLET | Freq: Two times a day (BID) | ORAL | 0 refills | Status: AC
  Filled 2024-08-11: qty 14, 7d supply, fill #0

## 2024-08-11 NOTE — Progress Notes (Signed)
 PODIATRY / FOOT AND ANKLE SURGERY PROGRESS NOTE  Chief Complaint: right ankle fracture   HPI: Colleen Washington is a 64 y.o. female who presents status post 1 day after undergoing right trimalleolar ankle fracture open reduction with internal fixation with syndesmotic ligament repair.  Patient has kept Dressings clean, dry, and intact since yesterday.  She has worked with PT and has done well so far.  She notes that she still has numbness in the foot and ankle as expected with the preoperative block.  She notes that she is having minimal pain at this time.  Patient currently denies nausea, vomiting, fevers, chills.  PMHx:  Past Medical History:  Diagnosis Date   allergic rhinitis    Anxiety    AR (allergic rhinitis)    Asthma    Bronchitis, chronic (HCC)    Depression    Dyspnea on exertion 10/22/2019   Endometriosis    GERD (gastroesophageal reflux disease)    H/O migraine    Herniated intervertebral disc    History of diverticulitis of colon    History of hiatal hernia    Hyperlipidemia    Hypertension    IBS (irritable bowel syndrome)    Irritable bowel syndrome    Menopause    Nonobstructive CAD (coronary artery disease)    a. 11/2019 Cor CTA: Cor Ca2+ = 141 (95th %'ile). LAD 25-49p.   Ovarian cyst, right 09/30/2015   PAC (premature atrial contraction) 10/22/2019   Polyp of transverse colon 05/22/2023   Psoriasis    SVT (supraventricular tachycardia) 08/15/2018   Thyroid  nodule 10/28/2016   Tobacco abuse    Vitamin D  deficiency 05/26/2015    Surgical Hx:  Past Surgical History:  Procedure Laterality Date   ABDOMINAL HYSTERECTOMY  2003   [id number]   BREAST BIOPSY Left 08/2014   benign-BENIGN BREAST TISSUE WITH FIBROSIS   COLONOSCOPY WITH PROPOFOL  N/A 05/22/2023   Procedure: COLONOSCOPY WITH PROPOFOL ;  Surgeon: Jinny Carmine, MD;  Location: [facility];  Service: Endoscopy;  Laterality: N/A;   DILATION AND CURETTAGE OF UTERUS     ESOPHAGOGASTRODUODENOSCOPY (EGD) WITH  PROPOFOL  N/A 05/22/2023   Procedure: ESOPHAGOGASTRODUODENOSCOPY (EGD) WITH PROPOFOL ;  Surgeon: Jinny Carmine, MD;  Location: [facility];  Service: Endoscopy;  Laterality: N/A;   POLYPECTOMY  05/22/2023   Procedure: POLYPECTOMY;  Surgeon: Jinny Carmine, MD;  Location: [facility];  Service: Endoscopy;;   XI ROBOTIC ASSISTED PARAESOPHAGEAL HERNIA REPAIR N/A 10/21/2023   Procedure: REPAIR, HERNIA, PARAESOPHAGEAL, ROBOT-ASSISTED;  Surgeon: Jordis Laneta FALCON, MD;  Location: [facility] ORS;  Service: General;  Laterality: N/A;    FHx:  Family History  Problem Relation Age of Onset   Arthritis Mother    Hyperlipidemia Mother    Cancer Father        colon   Diabetes Brother    Breast cancer Paternal Aunt 60   Arthritis Maternal Grandmother    Hyperlipidemia Maternal Grandfather    Heart disease Neg Hx    Colon cancer Neg Hx     Social History:  reports that she has been smoking cigarettes. She started smoking about 22 years ago. She has a 5 pack-year smoking history. She has been exposed to tobacco smoke. She has never used smokeless tobacco. She reports current alcohol use. She reports that she does not use drugs.  Allergies: Allergies[1]  Medications Prior to Admission  Medication Sig Dispense Refill   albuterol  (VENTOLIN  HFA) 108 (90 Base) MCG/ACT inhaler Inhale 1 puff into the lungs every 6 (six) hours as needed  for wheezing or shortness of breath. 18 each 2   aspirin  EC 81 MG tablet Take 1 tablet (81 mg total) by mouth daily. Swallow whole.     buPROPion  (WELLBUTRIN  XL) 150 MG 24 hr tablet TAKE 1 TABLET BY MOUTH EVERY DAY (Patient taking differently: Take 150 mg by mouth at bedtime.) 90 tablet 1   rosuvastatin  (CRESTOR ) 5 MG tablet Take 1 tablet (5 mg total) by mouth daily. 90 tablet 3   SKYRIZI  PEN 150 MG/ML pen ADMINISTER 1 INJECTION (150 MG/ML) UNDER THE SKIN EVERY 12 WEEKS AS DIRECTED FOR MAINTENANCE 1 mL 2   cetirizine  (ZYRTEC  ALLERGY) 10 MG tablet Take 1 tablet (10 mg total) by mouth  daily. (Patient not taking: Reported on 08/10/2024) 90 tablet 1   fluticasone  (FLONASE ) 50 MCG/ACT nasal spray Place 2 sprays into both nostrils daily. (Patient not taking: Reported on 08/10/2024) 16 g 6   Multiple Vitamins-Minerals (ALIVE WOMENS 50+ GUMMY PO) Take 2 each by mouth daily. (Patient not taking: Reported on 08/10/2024)     ondansetron  (ZOFRAN ) 4 MG tablet Take 1 tablet (4 mg total) by mouth every 8 (eight) hours as needed for nausea or vomiting. (Patient not taking: Reported on 08/10/2024) 30 tablet 1   ondansetron  (ZOFRAN -ODT) 4 MG disintegrating tablet Take 1 tablet (4 mg total) by mouth every 8 (eight) hours as needed for nausea or vomiting. (Patient not taking: Reported on 08/10/2024) 20 tablet 0   promethazine  (PHENERGAN ) 12.5 MG tablet TAKE 1 TABLET (12.5 MG TOTAL) BY MOUTH EVERY 4 HOURS AS NEEDED FOR NAUSEA AND VOMITING (Patient not taking: Reported on 08/10/2024) 30 tablet 0   Vitamin D -Vitamin K (VITAMIN K2-VITAMIN D3 PO) Take 1 tablet by mouth daily. (Patient not taking: Reported on 08/10/2024)      Physical Exam: General: Alert and oriented.  No apparent distress.  Right lower extremity splint/dressing is intact today with no strikethrough or loosening.  Patient able to dorsiflex and plantarflex digits of right foot.  Capillary fill time appears to be intact to digits right foot.  Light touch sensation intact to toes but slightly diminished secondary to block.  Results for orders placed or performed during the hospital encounter of 08/09/24 (from the past 48 hours)  Comprehensive metabolic panel     Status: Abnormal   Collection Time: 08/09/24 10:12 PM  Result Value Ref Range   Sodium 142 135 - 145 mmol/L   Potassium 3.7 3.5 - 5.1 mmol/L   Chloride 109 98 - 111 mmol/L   CO2 21 (L) 22 - 32 mmol/L   Glucose, Bld 103 (H) 70 - 99 mg/dL    Comment: Glucose reference range applies only to samples taken after fasting for at least 8 hours.   BUN 11 8 - 23 mg/dL   Creatinine, Ser  9.27 0.44 - 1.00 mg/dL   Calcium  9.4 8.9 - 10.3 mg/dL   Total Protein 6.9 6.5 - 8.1 g/dL   Albumin 4.2 3.5 - 5.0 g/dL   AST 21 15 - 41 U/L   ALT 17 0 - 44 U/L   Alkaline Phosphatase 80 38 - 126 U/L   Total Bilirubin 0.6 0.0 - 1.2 mg/dL   GFR, Estimated >39 >39 mL/min    Comment: (NOTE) Calculated using the CKD-EPI Creatinine Equation (2021)    Anion gap 12 5 - 15    Comment: Performed at [facility], [address], [place], [place] [address]  CBC with Differential     Status: None   Collection  Time: 08/09/24 10:12 PM  Result Value Ref Range   WBC 6.9 4.0 - 10.5 K/uL   RBC 4.22 3.87 - 5.11 MIL/uL   Hemoglobin 12.4 12.0 - 15.0 g/dL   HCT 63.1 63.9 - 53.9 %   MCV 87.2 80.0 - 100.0 fL   MCH 29.4 26.0 - 34.0 pg   MCHC 33.7 30.0 - 36.0 g/dL   RDW 86.9 88.4 - 84.4 %   Platelets 164 150 - 400 K/uL   nRBC 0.0 0.0 - 0.2 %   Neutrophils Relative % 54 %   Neutro Abs 3.8 1.7 - 7.7 K/uL   Lymphocytes Relative 34 %   Lymphs Abs 2.4 0.7 - 4.0 K/uL   Monocytes Relative 7 %   Monocytes Absolute 0.5 0.1 - 1.0 K/uL   Eosinophils Relative 4 %   Eosinophils Absolute 0.2 0.0 - 0.5 K/uL   Basophils Relative 1 %   Basophils Absolute 0.0 0.0 - 0.1 K/uL   Immature Granulocytes 0 %   Abs Immature Granulocytes 0.02 0.00 - 0.07 K/uL    Comment: Performed at [facility], [address] [place], [place] [address]  Type and screen [facility]     Status: None   Collection Time: 08/10/24 12:10 AM  Result Value Ref Range   ABO/RH(D) A NEG    Antibody Screen NEG    Sample Expiration      08/13/2024,2359 Performed at [facility], [address] [place], [place] [address]   Protime-INR     Status: None   Collection Time: 08/10/24 12:18 AM  Result Value Ref Range   Prothrombin Time 13.7 11.4 - 15.2 seconds   INR 1.0 0.8 - 1.2    Comment: (NOTE) INR goal varies based on device and disease states. Performed at [facility], [address] [place], [place] [address]   APTT     Status: None   Collection Time: 08/10/24 12:18 AM  Result Value Ref Range   aPTT 30 24 - 36 seconds    Comment: Performed at [facility], [address] [place], [place] [address]  MRSA Next Gen by PCR, Nasal     Status: None   Collection Time: 08/10/24  1:57 AM   Specimen: Nasal Mucosa; Nasal Swab  Result Value Ref Range   MRSA by PCR Next Gen NOT DETECTED NOT DETECTED    Comment: (NOTE) The GeneXpert MRSA Assay (FDA approved for NASAL specimens only), is one component of a comprehensive MRSA colonization surveillance program. It is not intended to diagnose MRSA infection nor to guide or monitor treatment for MRSA infections. Test performance is not FDA approved in patients less than 54 years old. Performed at [facility], [address] [place], [place] [address]   CBC     Status: Abnormal   Collection Time: 08/10/24  5:07 AM  Result Value Ref Range   WBC 6.7 4.0 - 10.5 K/uL   RBC 3.97 3.87 - 5.11 MIL/uL   Hemoglobin 11.5 (L) 12.0 - 15.0 g/dL   HCT 64.4 (L) 63.9 - 53.9 %   MCV 89.4 80.0 - 100.0 fL   MCH 29.0 26.0 - 34.0 pg   MCHC 32.4 30.0 - 36.0 g/dL   RDW 86.9 88.4 - 84.4 %   Platelets 147 (L) 150 - 400 K/uL   nRBC 0.0 0.0 - 0.2 %    Comment: Performed at [facility], [address], [place], [place] [address]  Basic  metabolic panel     Status: Abnormal   Collection Time: 08/10/24  5:07 AM  Result Value Ref Range   Sodium 141 135 - 145 mmol/L   Potassium 3.6 3.5 - 5.1 mmol/L   Chloride 107 98 - 111 mmol/L   CO2 26 22 - 32 mmol/L   Glucose, Bld 113 (H) 70 - 99 mg/dL    Comment: Glucose reference range applies only to samples taken after fasting for at least 8 hours.   BUN 11 8 - 23 mg/dL   Creatinine, Ser 9.24 0.44 - 1.00 mg/dL   Calcium  9.1 8.9 - 10.3 mg/dL   GFR, Estimated >39 >39 mL/min    Comment: (NOTE) Calculated using the CKD-EPI Creatinine Equation (2021)    Anion gap 8 5 - 15     Comment: Performed at [facility], [address] [place], [place] [address]   DG Ankle 2 Views Right Result Date: 08/10/2024 CLINICAL DATA:  Elective surgery EXAM: RIGHT ANKLE - 2 VIEW COMPARISON:  08/09/2024 FINDINGS: Multiple low resolution intraoperative spot views of the right ankle. Total fluoroscopy time was 39 seconds, fluoroscopic dose of 1.061 mGy. Images were obtained during operative surgical plate and screw fixation of distal fibular fracture and placement of fixating screws at the medial malleolus for medial malleolar fracture. Anatomic alignment on the final images. IMPRESSION: Intraoperative fluoroscopic assistance provided during surgical fixation of distal fibular and medial malleolar fractures. Electronically Signed   By: Luke Bun M.D.   On: 08/10/2024 23:52   DG C-Arm 1-60 Min-No Report Result Date: 08/10/2024 Fluoroscopy was utilized by the requesting physician.  No radiographic interpretation.   DG C-Arm 1-60 Min-No Report Result Date: 08/10/2024 Fluoroscopy was utilized by the requesting physician.  No radiographic interpretation.   US  OR NERVE BLOCK-IMAGE ONLY [facility]) Result Date: 08/10/2024 There is no interpretation for this exam.  This order is for images obtained during a surgical procedure.  Please See Surgeries Tab for more information regarding the procedure.   DG Ankle Complete Right Result Date: 08/09/2024 EXAM: 3 OR MORE VIEW(S) XRAY OF THE RIGHT ANKLE 08/09/2024 07:06:00 PM CLINICAL HISTORY: Patient fell. COMPARISON: None available. FINDINGS: BONES AND JOINTS: Mildly displaced oblique fracture of distal fibula. Mildly displaced fracture of medial malleolus. Widening of medial clear space. SOFT TISSUES: Soft tissue edema. IMPRESSION: 1. Mildly displaced oblique fracture of the distal fibula and mildly displaced fracture of the medial malleolus, with widening of the medial clear space. 2. Soft tissue edema. Electronically signed by: Oneil Devonshire MD  08/09/2024 07:20 PM EST RP Workstation: [id number]    Blood pressure (!) 896/41, pulse 91, temperature 97.8 F (36.6 C), resp. rate 18, height 5' 7 (1.702 m), weight 88.9 kg, SpO2 96%.  Assessment Status post right trimalleolar ankle fracture open reduction with internal fixation with syndesmotic ligament repair  Plan - Patient seen and examined - Postop imaging reviewed and discussed with patient in detail.  Appears to show anatomic alignment. - Patient keep dressings clean, dry, and intact until seen in outpatient clinic in 1 week. - Appreciate PT/OT recommendations.  Patient to remain nonweightbearing to the right lower extremity at all times for the next 6 weeks.  Patient may work on knee flexion and extension exercises daily with calf massages. - Recommend at time of discharge aspirin  81 mg twice daily for at least 6 weeks.  Also recommended Augmentin  7-day course. - Appreciate medicine recommendations for pain management.  Patient's pain appears to be well-controlled at this time.  May use Tylenol  for mild to moderate pain.  If pain is severe may take Percocet 5 at discharge.  Podiatry team to sign off at this time, patient to follow-up in outpatient clinic in 1 week  Prentice Lee, DPM 08/11/2024, 2:16 PM         [1]  Allergies Allergen Reactions   Compazine [Prochlorperazine] Swelling   Prochlorperazine Edisylate    Prochlorperazine Maleate

## 2024-08-11 NOTE — Telephone Encounter (Signed)
 Per pt need to cancel procedure . Pt fall on ice.

## 2024-08-11 NOTE — Progress Notes (Signed)
 Occupational Therapy Treatment Patient Details Name: Colleen Washington MRN: [id number] DOB: 1960/12/12 Today's Date: 08/11/2024   History of present illness 64 y/o female presented to ED on 08/09/24 after slipping and falling with R ankle pain. Sustained mildly displaced oblique fracture of distal fibular and mildly displaced fracture of medial malleolus of R ankle. S/p R ankle ORIF and syndesmotic ligament repair on 1/27. PMH: HTN, CAD, depression with anxiety, obesity, psoriasis, migraine headaches, diverticulitis, IBS   OT comments  Pt seen for OT treatment on this date. Upon arrival to room pt seated in recliner, agreeable to tx. Son and pt educated on DME recs, d/c recs, falls safety, and stairs navigation. Required Min A x2 + RW to navigate x4 steps. Completed step pivot t/fs x3 CGA w RW chair<>steps>transport chair. Left in transport chair with RN, pt ready to d/c. Pt making good progress toward goals, will continue to follow POC. Discharge recommendation remains appropriate.        If plan is discharge home, recommend the following:  Help with stairs or ramp for entrance;A little help with bathing/dressing/bathroom   Equipment Recommendations  BSC/3in1    Recommendations for Other Services      Precautions / Restrictions Precautions Precautions: Fall Recall of Precautions/Restrictions: Intact Restrictions Weight Bearing Restrictions Per Provider Order: Yes RLE Weight Bearing Per Provider Order: Non weight bearing       Mobility Bed Mobility               General bed mobility comments: in recliner pre and post session    Transfers Overall transfer level: Needs assistance Equipment used: Rolling walker (2 wheels) Transfers: Sit to/from Stand Sit to Stand: Contact guard assist                 Balance Overall balance assessment: Needs assistance Sitting-balance support: No upper extremity supported, Feet supported Sitting balance-Leahy Scale: Normal      Standing balance support: During functional activity, Single extremity supported Standing balance-Leahy Scale: Good                             ADL either performed or assessed with clinical judgement   ADL Overall ADL's : Needs assistance/impaired                                       General ADL Comments: Min A x2 Navigate x4 steps    Extremity/Trunk Assessment Upper Extremity Assessment Upper Extremity Assessment: Overall WFL for tasks assessed   Lower Extremity Assessment Lower Extremity Assessment: RLE deficits/detail RLE Deficits / Details: immobilized in lower leg splint        Vision       Perception     Praxis     Communication Communication Communication: No apparent difficulties   Cognition Arousal: Alert Behavior During Therapy: WFL for tasks assessed/performed Cognition: No apparent impairments                               Following commands: Intact        Cueing   Cueing Techniques: Verbal cues, Tactile cues  Exercises      Shoulder Instructions       General Comments      Pertinent Vitals/ Pain       Pain Assessment Pain Assessment: No/denies  pain  Home Living Family/patient expects to be discharged to:: Private residence Living Arrangements: Spouse/significant other Available Help at Discharge: Family;Available 24 hours/day Type of Home: House (planning to d/c to son's house initially) Home Access: Stairs to enter [employer] of Steps: 2 Entrance Stairs-Rails: Right;Left;Can reach both Home Layout: One level     Bathroom Shower/Tub: Runner, Broadcasting/film/video: Other (comment);BSC/3in1 (knee scooter (renting))          Prior Functioning/Environment              Frequency  Min 2X/week        Progress Toward Goals  OT Goals(current goals can now be found in the care plan section)  Progress towards OT goals: Progressing toward goals  Acute Rehab  OT Goals Patient Stated Goal: go home OT Goal Formulation: With patient Time For Goal Achievement: 08/25/24 Potential to Achieve Goals: Good ADL Goals Pt Will Perform Grooming: Independently;standing Pt Will Perform Lower Body Dressing: Independently;sit to/from stand Pt Will Transfer to Toilet: ambulating;regular height toilet;with modified independence  Plan      Co-evaluation    PT/OT/SLP Co-Evaluation/Treatment: Yes Reason for Co-Treatment: To address functional/ADL transfers PT goals addressed during session: Mobility/safety with mobility OT goals addressed during session: ADL's and self-care      AM-PAC OT 6 Clicks Daily Activity     Outcome Measure   Help from another person eating meals?: None Help from another person taking care of personal grooming?: None Help from another person toileting, which includes using toliet, bedpan, or urinal?: A Little Help from another person bathing (including washing, rinsing, drying)?: A Little Help from another person to put on and taking off regular upper body clothing?: None Help from another person to put on and taking off regular lower body clothing?: A Little 6 Click Score: 21    End of Session Equipment Utilized During Treatment: Gait belt;Rolling walker (2 wheels)  OT Visit Diagnosis: Other abnormalities of gait and mobility (R26.89);Muscle weakness (generalized) (M62.81)   Activity Tolerance Patient tolerated treatment well   Patient Left with family/visitor present;in chair   Nurse Communication          Time: 9674-9655 OT Time Calculation (min): 19 min  Charges: OT General Charges $OT Visit: 1 Visit OT Evaluation $OT Eval Low Complexity: 1 Low OT Treatments $Therapeutic Activity: 8-22 mins  Kameran Mcneese OTS   Deandra Goering 08/11/2024, 4:02 PM

## 2024-08-11 NOTE — Discharge Instructions (Signed)
 [facility]  POST OPERATIVE INSTRUCTIONS FOR DR. ASHLEY AND DR. Jaciel Diem [facility] PODIATRY DEPARTMENT   Take your medication as prescribed.  Pain medication should be taken only as needed.  Keep the dressing clean, dry and intact.  Keep your foot elevated above the heart level for the first 48 hours.  Walking to the bathroom and brief periods of walking are acceptable, unless we have instructed you to be non-weight bearing.  Always wear your post-op shoe when walking.  Always use your crutches if you are to be non-weight bearing.  Do not take a shower. Baths are permissible as long as the foot is kept out of the water.   Every hour you are awake:  Bend your knee 15 times. Massage calf 15 times  Call [facility] [phone]) if any of the following problems occur: You develop a temperature or fever. The bandage becomes saturated with blood. Medication does not stop your pain. Injury of the foot occurs. Any symptoms of infection including redness, odor, or red streaks running from wound.

## 2024-08-11 NOTE — Evaluation (Signed)
 Physical Therapy Evaluation Patient Details Name: Colleen Washington MRN: [id number] DOB: 16-Aug-1960 Today's Date: 08/11/2024  History of Present Illness  64 y/o female presented to ED on 08/09/24 after slipping and falling with R ankle pain. Sustained mildly displaced oblique fracture of distal fibular and mildly displaced fracture of medial malleolus of R ankle. S/p R ankle ORIF and syndesmotic ligament repair on 1/27. PMH: HTN, CAD, depression with anxiety, obesity, psoriasis, migraine headaches, diverticulitis, IBS  Clinical Impression  Patient admitted with the above. PTA, patient lives with husband and was independent with no AD. Patient presents with weakness, impaired balance, and decreased activity tolerance. Educated patient on NWB status on R LE as well as stair negotiation backwards on buttocks, patient verbalized understanding. Completed sit to stand from recliner with CGA and able to perform 20' total of ambulation with hop to pattern with RW and CGA. Able to maintain NWB on R throughout session. Patient will benefit from skilled PT services during acute stay to address listed deficits.       If plan is discharge home, recommend the following: A little help with walking and/or transfers;A little help with bathing/dressing/bathroom;Assistance with cooking/housework;Help with stairs or ramp for entrance;Assist for transportation   Can travel by private vehicle        Equipment Recommendations Rolling Jolana Runkles (2 wheels)  Recommendations for Other Services       Functional Status Assessment Patient has had a recent decline in their functional status and demonstrates the ability to make significant improvements in function in a reasonable and predictable amount of time.     Precautions / Restrictions Precautions Precautions: Fall Recall of Precautions/Restrictions: Intact Restrictions Weight Bearing Restrictions Per Provider Order: Yes RLE Weight Bearing Per Provider Order: Non weight  bearing      Mobility  Bed Mobility               General bed mobility comments: in recliner pre and post session    Transfers Overall transfer level: Needs assistance Equipment used: Rolling Joyanna Kleman (2 wheels) Transfers: Sit to/from Stand Sit to Stand: Contact guard assist                Ambulation/Gait Ambulation/Gait assistance: Contact guard assist, Supervision Gait Distance (Feet): 20 Feet Assistive device: Rolling Georgia Baria (2 wheels) Gait Pattern/deviations:  (hop to) Gait velocity: decreased     General Gait Details: able to maintain NWB on R LE throughout session.  Stairs            Wheelchair Mobility     Tilt Bed    Modified Rankin (Stroke Patients Only)       Balance Overall balance assessment: Mild deficits observed, not formally tested, History of Falls                                           Pertinent Vitals/Pain Pain Assessment Pain Assessment: No/denies pain    Home Living Family/patient expects to be discharged to:: Private residence Living Arrangements: Spouse/significant other Available Help at Discharge: Family;Available 24 hours/day Type of Home: House (planning to d/c to son's house initially) Home Access: Stairs to enter Entrance Stairs-Rails: Right;Left;Can reach both Entrance Stairs-Number of Steps: 2   Home Layout: One level Home Equipment: Other (comment);BSC/3in1 (knee scooter (renting))      Prior Function Prior Level of Function : Independent/Modified Independent;Working/employed;Driving  Extremity/Trunk Assessment   Upper Extremity Assessment Upper Extremity Assessment: Overall WFL for tasks assessed    Lower Extremity Assessment Lower Extremity Assessment: RLE deficits/detail RLE Deficits / Details: immobilized in lower leg splint       Communication   Communication Communication: No apparent difficulties    Cognition Arousal: Alert Behavior  During Therapy: WFL for tasks assessed/performed   PT - Cognitive impairments: No apparent impairments                         Following commands: Intact       Cueing       General Comments      Exercises     Assessment/Plan    PT Assessment Patient needs continued PT services  PT Problem List Decreased strength;Decreased activity tolerance;Decreased balance;Decreased mobility       PT Treatment Interventions DME instruction;Stair training;Gait training;Functional mobility training;Therapeutic activities;Therapeutic exercise;Balance training;Neuromuscular re-education;Patient/family education    PT Goals (Current goals can be found in the Care Plan section)  Acute Rehab PT Goals Patient Stated Goal: to go home PT Goal Formulation: With patient/family Time For Goal Achievement: 08/25/24 Potential to Achieve Goals: Good    Frequency Min 2X/week     Co-evaluation               AM-PAC PT 6 Clicks Mobility  Outcome Measure Help needed turning from your back to your side while in a flat bed without using bedrails?: A Little Help needed moving from lying on your back to sitting on the side of a flat bed without using bedrails?: A Little Help needed moving to and from a bed to a chair (including a wheelchair)?: A Little Help needed standing up from a chair using your arms (e.g., wheelchair or bedside chair)?: A Little Help needed to walk in hospital room?: A Little Help needed climbing 3-5 steps with a railing? : A Little 6 Click Score: 18    End of Session   Activity Tolerance: Patient tolerated treatment well Patient left: in chair;with call bell/phone within reach;with family/visitor present Nurse Communication: Mobility status PT Visit Diagnosis: Unsteadiness on feet (R26.81);Muscle weakness (generalized) (M62.81);History of falling (Z91.81);Other abnormalities of gait and mobility (R26.89)    Time: 8941-8879 PT Time Calculation (min) (ACUTE  ONLY): 22 min   Charges:   PT Evaluation $PT Eval Moderate Complexity: 1 Mod   PT General Charges $$ ACUTE PT VISIT: 1 Visit         Maryanne Finder, PT, DPT Physical Therapist - [facility]  [facility]  Jeffery Bachmeier A Lafe Clerk 08/11/2024, 1:01 PM

## 2024-08-11 NOTE — TOC CM/SW Note (Signed)
 Transition of Care [facility]) - Inpatient Brief Assessment   Patient Details  Name: Colleen Washington MRN: [id number] Date of Birth: Jan 07, 1961  Transition of Care [facility]) CM/SW Contact:    Daved JONETTA Hamilton, RN Phone Number: 08/11/2024, 1:58 PM   Clinical Narrative:   Transition of Care [facility]) Screening Note   Patient Details  Name: Colleen Washington Date of Birth: Nov 18, 1960   Transition of Care [facility]) CM/SW Contact:    Daved JONETTA Hamilton, RN Phone Number: 08/11/2024, 1:58 PM  No recommendations from therapy, patient declined DME as she purchased out of pocket. Son will transport home.   Transition of Care Department [facility]) has reviewed patient and no TOC needs have been identified at this time. If new patient transition needs arise, please place a TOC consult.    Transition of Care Asessment: Insurance and Status: Insurance coverage has been reviewed Patient has primary care physician: Yes Home environment has been reviewed: Lives at home with spouse Prior level of function:: Independent Prior/Current Home Services: No current home services Social Drivers of Health Review: SDOH reviewed no interventions necessary Readmission risk has been reviewed: Yes Transition of care needs: no transition of care needs at this time

## 2024-08-11 NOTE — Evaluation (Signed)
 Occupational Therapy Evaluation Patient Details Name: Colleen Washington MRN: [id number] DOB: 24-Sep-1960 Today's Date: 08/11/2024   History of Present Illness   64 y/o female presented to ED on 08/09/24 after slipping and falling with R ankle pain. Sustained mildly displaced oblique fracture of distal fibular and mildly displaced fracture of medial malleolus of R ankle. S/p R ankle ORIF and syndesmotic ligament repair on 1/27. PMH: HTN, CAD, depression with anxiety, obesity, psoriasis, migraine headaches, diverticulitis, IBS   Clinical Impressions Colleen Washington was seen for OT evaluation this date. Prior to hospital admission, pt was IND including working full time. Pt lives with spouse. Pt currently requires CGA + RW sit<>stand and tolerates ~8 ft hopping steps x2 with standing rest break. CGA standing dressing, anticipate MIN A for LB access. Educated on DME recs. Pt would benefit from skilled OT to address noted impairments and functional limitations (see below for any additional details). Upon hospital discharge, recommend no OT follow up.     If plan is discharge home, recommend the following:   Help with stairs or ramp for entrance;A little help with bathing/dressing/bathroom     Functional Status Assessment   Patient has had a recent decline in their functional status and demonstrates the ability to make significant improvements in function in a reasonable and predictable amount of time.     Equipment Recommendations   BSC/3in1 (RW)     Recommendations for Other Services         Precautions/Restrictions   Precautions Precautions: Fall Recall of Precautions/Restrictions: Intact Restrictions Weight Bearing Restrictions Per Provider Order: Yes RLE Weight Bearing Per Provider Order: Non weight bearing     Mobility Bed Mobility               General bed mobility comments: in recliner pre and post session    Transfers Overall transfer level: Needs  assistance Equipment used: Rolling walker (2 wheels) Transfers: Sit to/from Stand Sit to Stand: Contact guard assist                  Balance Overall balance assessment: Needs assistance Sitting-balance support: No upper extremity supported, Feet supported Sitting balance-Leahy Scale: Normal     Standing balance support: During functional activity, Single extremity supported Standing balance-Leahy Scale: Good                             ADL either performed or assessed with clinical judgement   ADL Overall ADL's : Needs assistance/impaired                                       General ADL Comments: CGA + RW for simulated BSC t/f, tolerates ~8 ft hopping steps. CGA standing dressing, anticipate MIN A for LB access     Vision         Perception         Praxis         Pertinent Vitals/Pain Pain Assessment Pain Assessment: No/denies pain     Extremity/Trunk Assessment Upper Extremity Assessment Upper Extremity Assessment: Overall WFL for tasks assessed   Lower Extremity Assessment Lower Extremity Assessment: RLE deficits/detail RLE Deficits / Details: immobilized in lower leg splint       Communication Communication Communication: No apparent difficulties   Cognition Arousal: Alert Behavior During Therapy: WFL for tasks assessed/performed Cognition: No apparent impairments  Following commands: Intact       Cueing  General Comments          Exercises     Shoulder Instructions      Home Living Family/patient expects to be discharged to:: Private residence Living Arrangements: Spouse/significant other Available Help at Discharge: Family;Available 24 hours/day Type of Home: House (planning to d/c to son's house initially) Home Access: Stairs to enter [employer] of Steps: 2 Entrance Stairs-Rails: Right;Left;Can reach both Home Layout: One level      Bathroom Shower/Tub: Walk-in shower         Home Equipment: Other (comment);BSC/3in1 (knee scooter (renting))          Prior Functioning/Environment Prior Level of Function : Independent/Modified Independent;Working/employed;Driving                    OT Problem List: Decreased activity tolerance;Impaired balance (sitting and/or standing);Decreased range of motion   OT Treatment/Interventions: Self-care/ADL training;Therapeutic exercise;Energy conservation;DME and/or AE instruction;Therapeutic activities;Patient/family education;Balance training      OT Goals(Current goals can be found in the care plan section)   Acute Rehab OT Goals Patient Stated Goal: to go home OT Goal Formulation: With patient Time For Goal Achievement: 08/25/24 Potential to Achieve Goals: Good ADL Goals Pt Will Perform Grooming: Independently;standing Pt Will Perform Lower Body Dressing: Independently;sit to/from stand Pt Will Transfer to Toilet: ambulating;regular height toilet;with modified independence   OT Frequency:  Min 2X/week    Co-evaluation PT/OT/SLP Co-Evaluation/Treatment: Yes Reason for Co-Treatment: To address functional/ADL transfers PT goals addressed during session: Mobility/safety with mobility OT goals addressed during session: ADL's and self-care      AM-PAC OT 6 Clicks Daily Activity     Outcome Measure Help from another person eating meals?: None Help from another person taking care of personal grooming?: None Help from another person toileting, which includes using toliet, bedpan, or urinal?: A Little Help from another person bathing (including washing, rinsing, drying)?: A Little Help from another person to put on and taking off regular upper body clothing?: None Help from another person to put on and taking off regular lower body clothing?: A Little 6 Click Score: 21   End of Session Equipment Utilized During Treatment: Rolling walker (2  wheels)  Activity Tolerance: Patient tolerated treatment well Patient left: in chair;with call bell/phone within reach;with family/visitor present  OT Visit Diagnosis: Other abnormalities of gait and mobility (R26.89);Muscle weakness (generalized) (M62.81)                Time: 8941-8879 OT Time Calculation (min): 22 min Charges:  OT General Charges $OT Visit: 1 Visit OT Evaluation $OT Eval Low Complexity: 1 Low  Colleen Washington, M.S. OTR/L  08/11/24, 1:17 PM  ascom [phone]

## 2024-08-11 NOTE — Progress Notes (Signed)
 Initial Nutrition Assessment  DOCUMENTATION CODES:   Obesity unspecified  INTERVENTION:   -Continue regular diet -MVI with minerals daily -Magic cup BID with meals, each supplement provides 290 kcal and 9 grams of protein   NUTRITION DIAGNOSIS:   Increased nutrient needs related to post-op healing as evidenced by estimated needs.  GOAL:   Patient will meet greater than or equal to 90% of their needs  MONITOR:   PO intake, Supplement acceptance  REASON FOR ASSESSMENT:   Consult Assessment of nutrition requirement/status, Hip fracture protocol  ASSESSMENT:   64 year old female with medical history significant of HTN, HLD, chronic bronchitis, CAD, depression with anxiety, obesity, psoriasis, migraine headaches, diverticulitis, IBS, SVT, depression, who presents with fall and right ankle pain.  Patient admitted with closed right ankle fracture.   1/27- s/p Right trimalleolar ankle fracture open reduction with internal fixation, Right syndesmotic ligament repair   Reviewed I/O's: +1.2 L x 24 hours and +1.7 L since admission  Spoke with patient at bedside, who was pleasant and in good spirits today. She reports feeling well after surgery last night and is eager to eat breakfast. Patient shares that she has a good appetite at baseline, consuming 3 meals per day. She reports not eating much yesterday due to surgery and not wanting a sandwich last night for dinner.  Patient reports hospitalization last year for hiatal hernia repair, which she is recovered well from.   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   Patient denies any weight loss. Her UBW is around 200#. Patient has experienced a 5% weight loss over the past 4 months, which is not significant for time frame.    Discussed importance of good meal and supplement intake to promote healing. Patient had not further questions or concerns  regarding her nutrition plan of care, but expressed appreciation for visit.   Medications reviewed and include protonix  and 0.9% sodium chloride  infusion @ 75 ml/hr.   Labs reviewed.    NUTRITION - FOCUSED PHYSICAL EXAM:  Flowsheet Row Most Recent Value  Orbital Region No depletion  Upper Arm Region No depletion  Thoracic and Lumbar Region No depletion  Buccal Region No depletion  Temple Region No depletion  Clavicle Bone Region No depletion  Clavicle and Acromion Bone Region No depletion  Scapular Bone Region No depletion  Dorsal Hand No depletion  Patellar Region No depletion  Anterior Thigh Region No depletion  Posterior Calf Region No depletion  Edema (RD Assessment) Mild  Hair Reviewed  Eyes Reviewed  Mouth Reviewed  Skin Reviewed  Nails Reviewed    Diet Order:   Diet Order             Diet regular Room service appropriate? Yes; Fluid consistency: Thin  Diet effective now                   EDUCATION NEEDS:   Education needs have been addressed  Skin:  Skin Assessment: Skin Integrity Issues: Skin Integrity Issues:: Incisions Incisions: closed right ankle  Last BM:  08/09/24  Height:   Ht Readings from Last 1 Encounters:  08/09/24 5' 7 (1.702 m)    Weight:   Wt Readings from Last 1 Encounters:  08/09/24 88.9 kg    Ideal Body Weight:  61.4 kg  BMI:  Body mass index is 30.7 kg/m.  Estimated Nutritional Needs:   Kcal:  1800-2000  Protein:  90-105 grams  Fluid:  1.8-2.0 L    Margery ORN, RD, LDN, CDCES Registered Dietitian III Certified Diabetes Care and Education Specialist If unable to reach this RD, please use RD Inpatient group chat on secure chat between hours of 8am-4 pm daily

## 2024-08-11 NOTE — Discharge Summary (Signed)
 " Physician Discharge Summary   Patient: Colleen Washington MRN: [id number] DOB: 1961-05-12  Admit date:     08/09/2024  Discharge date: 08/11/24  Discharge Physician: Drue ONEIDA Potter   PCP: Marylynn Verneita CROME, MD   Recommendations at discharge:  Follow-up with podiatry  Discharge Diagnoses: Principal Problem:   Closed right ankle fracture Active Problems:   Fall at home, initial encounter   Coronary artery disease involving native coronary artery of native heart without angina pectoris   HTN (hypertension)   Hyperlipidemia   Bronchitis, chronic (HCC)   SVT (supraventricular tachycardia)   Psoriasis   GERD (gastroesophageal reflux disease)   Tobacco abuse   Obesity (BMI 30.0-34.9)   Depression  Resolved Problems:   * No resolved hospital problems. *  Hospital Course:   64 y.o. female with medical history significant of HTN, HLD, chronic bronchitis, CAD, depression with anxiety, obesity, psoriasis, migraine headaches, diverticulitis, IBS, SVT, depression, who presents with fall and right ankle pain.   Patient is from hotel.  She reports slipping in snow, falling, injured right ankle.  She developed pain in the right ankle which is constant, severe, sharp, nonradiating, aggravated by movement.  No leg numbness.  Patient strongly denies head or neck injury.  She does not want to do CT scan of head or neck.  No chest pain, cough, SOB.  No nausea, vomiting, diarrhea or abdominal pain.  No symptoms of UTI.  No fever or chills.     Assessment and Plan:   Acute Closed right ankle fracture: X-ray showed mildly displaced oblique fracture of the distal fibula and mildly displaced  fracture of the medial malleolus, with widening of the medial clear space.  Patient has moderate pain now. No neurovascular compromise.  Dr. Lennie for podiatry was consulted.  Patient cleared by podiatrist for discharge today and will follow-up as an outpatient   Nausea-resolved   Fall at home, initial  encounter: Patient was seen by PT OT   Coronary artery disease involving native coronary artery of native heart without angina pectoris: No chest pain. - Aspirin  and Crestor  (Skip aspirin  dose on 1/27 for surgery)   HTN (hypertension): Continue monitoring   Hyperlipidemia -Crestor    Bronchitis, chronic (HCC): Stable, no wheezing -Bronchodilators and prn Mucinex    SVT (supraventricular tachycardia): Heart rate 80s. -Telemetry monitoring   Psoriasis -Patient is still receiving Skyrizi  injection every 12 weeks.   GERD (gastroesophageal reflux disease) -Protonix    Tobacco abuse - counseled by admitting provider on importance of quitting smoking - Nicotine  patch    Consultants: Podiatry Procedures performed: As mentioned above Disposition: Home Diet recommendation:  Cardiac and Carb modified diet DISCHARGE MEDICATION: Allergies as of 08/11/2024       Reactions   Compazine [prochlorperazine] Swelling   Prochlorperazine Edisylate    Prochlorperazine Maleate         Medication List     STOP taking these medications    ALIVE WOMENS 50+ GUMMY PO   ondansetron  4 MG disintegrating tablet Commonly known as: ZOFRAN -ODT   promethazine  12.5 MG tablet Commonly known as: PHENERGAN        TAKE these medications    albuterol  108 (90 Base) MCG/ACT inhaler Commonly known as: VENTOLIN  HFA Inhale 1 puff into the lungs every 6 (six) hours as needed for wheezing or shortness of breath.   amoxicillin -clavulanate 875-125 MG tablet Commonly known as: AUGMENTIN  Take 1 tablet by mouth 2 (two) times daily for 7 days.   aspirin  EC 81 MG tablet Take  1 tablet (81 mg total) by mouth daily. Swallow whole. What changed: Another medication with the same name was added. Make sure you understand how and when to take each.   aspirin  EC 81 MG tablet Take 1 tablet (81 mg total) by mouth daily. Swallow whole. What changed: You were already taking a medication with the same name, and  this prescription was added. Make sure you understand how and when to take each.   buPROPion  150 MG 24 hr tablet Commonly known as: WELLBUTRIN  XL TAKE 1 TABLET BY MOUTH EVERY DAY What changed: when to take this   cetirizine  10 MG tablet Commonly known as: ZyrTEC  Allergy Take 1 tablet (10 mg total) by mouth daily.   fluticasone  50 MCG/ACT nasal spray Commonly known as: FLONASE  Place 2 sprays into both nostrils daily.   methocarbamol  500 MG tablet Commonly known as: ROBAXIN  Take 1 tablet (500 mg total) by mouth every 8 (eight) hours as needed for muscle spasms.   ondansetron  4 MG tablet Commonly known as: Zofran  Take 1 tablet (4 mg total) by mouth every 8 (eight) hours as needed for nausea or vomiting.   rosuvastatin  5 MG tablet Commonly known as: CRESTOR  Take 1 tablet (5 mg total) by mouth daily.   Senna-Plus 8.6-50 MG tablet Generic drug: senna-docusate Take 1 tablet by mouth at bedtime as needed for mild constipation.   Skyrizi  Pen 150 MG/ML pen Generic drug: risankizumab -rzaa ADMINISTER 1 INJECTION (150 MG/ML) UNDER THE SKIN EVERY 12 WEEKS AS DIRECTED FOR MAINTENANCE   VITAMIN K2-VITAMIN D3 PO Take 1 tablet by mouth daily.               Durable Medical Equipment  (From admission, onward)           Start     Ordered   08/11/24 1530  For home use only DME 4 wheeled rolling walker with seat  Once       Question:  Patient needs a walker to treat with the following condition  Answer:  Ambulatory dysfunction   08/11/24 1529   08/11/24 1530  For home use only DME Bedside commode  Once       Question:  Patient needs a bedside commode to treat with the following condition  Answer:  Ambulatory dysfunction   08/11/24 1529            Follow-up Information     Lennie Barter, DPM. Schedule an appointment as soon as possible for a visit in 1 week(s).   Specialty: Podiatry Why: For wound re-check Contact information: [address] [place]  [address] [phone]                Discharge Exam: Fredricka Weights   08/09/24 1838  Weight: 88.9 kg    Head: Normocephalic and atraumatic.  Cardiovascular:     Rate and Rhythm: Normal rate and regular rhythm.     Pulses: Normal pulses.     Heart sounds: Normal heart sounds.  Pulmonary:     Effort: Pulmonary effort is normal. No respiratory distress.     Breath sounds: Normal breath sounds. No wheezing.  Abdominal:     General: Bowel sounds are normal. There is no distension.     Palpations: Abdomen is soft.     Tenderness: There is no abdominal tenderness.  Musculoskeletal:     Comments: R foot fracture site covered in dressing clean/dry/intact   Condition at discharge: good  The results of significant diagnostics from this hospitalization (  including imaging, microbiology, ancillary and laboratory) are listed below for reference.   Imaging Studies: DG Ankle 2 Views Right Result Date: 08/10/2024 CLINICAL DATA:  Elective surgery EXAM: RIGHT ANKLE - 2 VIEW COMPARISON:  08/09/2024 FINDINGS: Multiple low resolution intraoperative spot views of the right ankle. Total fluoroscopy time was 39 seconds, fluoroscopic dose of 1.061 mGy. Images were obtained during operative surgical plate and screw fixation of distal fibular fracture and placement of fixating screws at the medial malleolus for medial malleolar fracture. Anatomic alignment on the final images. IMPRESSION: Intraoperative fluoroscopic assistance provided during surgical fixation of distal fibular and medial malleolar fractures. Electronically Signed   By: Luke Bun M.D.   On: 08/10/2024 23:52   DG C-Arm 1-60 Min-No Report Result Date: 08/10/2024 Fluoroscopy was utilized by the requesting physician.  No radiographic interpretation.   DG C-Arm 1-60 Min-No Report Result Date: 08/10/2024 Fluoroscopy was utilized by the requesting physician.  No radiographic interpretation.   US  OR NERVE BLOCK-IMAGE ONLY [facility]) Result  Date: 08/10/2024 There is no interpretation for this exam.  This order is for images obtained during a surgical procedure.  Please See Surgeries Tab for more information regarding the procedure.   DG Ankle Complete Right Result Date: 08/09/2024 EXAM: 3 OR MORE VIEW(S) XRAY OF THE RIGHT ANKLE 08/09/2024 07:06:00 PM CLINICAL HISTORY: Patient fell. COMPARISON: None available. FINDINGS: BONES AND JOINTS: Mildly displaced oblique fracture of distal fibula. Mildly displaced fracture of medial malleolus. Widening of medial clear space. SOFT TISSUES: Soft tissue edema. IMPRESSION: 1. Mildly displaced oblique fracture of the distal fibula and mildly displaced fracture of the medial malleolus, with widening of the medial clear space. 2. Soft tissue edema. Electronically signed by: Oneil Devonshire MD 08/09/2024 07:20 PM EST RP Workstation: [id number]    Microbiology: Results for orders placed or performed during the hospital encounter of 08/09/24  MRSA Next Gen by PCR, Nasal     Status: None   Collection Time: 08/10/24  1:57 AM   Specimen: Nasal Mucosa; Nasal Swab  Result Value Ref Range Status   MRSA by PCR Next Gen NOT DETECTED NOT DETECTED Final    Comment: (NOTE) The GeneXpert MRSA Assay (FDA approved for NASAL specimens only), is one component of a comprehensive MRSA colonization surveillance program. It is not intended to diagnose MRSA infection nor to guide or monitor treatment for MRSA infections. Test performance is not FDA approved in patients less than 75 years old. Performed at [facility], [address] [place], [place] [address]     Labs: CBC: Recent Labs  Lab 08/09/24 2212 08/10/24 0507  WBC 6.9 6.7  NEUTROABS 3.8  --   HGB 12.4 11.5*  HCT 36.8 35.5*  MCV 87.2 89.4  PLT 164 147*   Basic Metabolic Panel: Recent Labs  Lab 08/09/24 2212 08/10/24 0507  NA 142 141  K 3.7 3.6  CL 109 107  CO2 21* 26  GLUCOSE 103* 113*  BUN 11 11  CREATININE 0.72 0.75  CALCIUM   9.4 9.1   Liver Function Tests: Recent Labs  Lab 08/09/24 2212  AST 21  ALT 17  ALKPHOS 80  BILITOT 0.6  PROT 6.9  ALBUMIN 4.2   CBG: No results for input(s): GLUCAP in the last 168 hours.  Discharge time spent:  35 minutes.  Signed: Drue ONEIDA Potter, MD [facility] 08/11/2024 "

## 2024-08-11 NOTE — Telephone Encounter (Signed)
 Voice message has been left for patient to let her know that her message was received to cancel her 08/17/24 colonoscopy with Dr. Jinny due to her recent fall.  Informed her to call office back when she is ready to reschedule.  Thanks,  [place], CMA

## 2024-08-12 ENCOUNTER — Telehealth: Payer: Self-pay

## 2024-08-12 NOTE — Anesthesia Postprocedure Evaluation (Signed)
"   Anesthesia Post Note  Patient: Colleen Washington  Procedure(s) Performed: OPEN REDUCTION INTERNAL FIXATION (ORIF) ANKLE FRACTURE (Right: Ankle)  Patient location during evaluation: PACU Anesthesia Type: General Level of consciousness: awake and alert Pain management: pain level controlled Vital Signs Assessment: post-procedure vital signs reviewed and stable Respiratory status: spontaneous breathing, nonlabored ventilation and respiratory function stable Cardiovascular status: blood pressure returned to baseline and stable Postop Assessment: no apparent nausea or vomiting Anesthetic complications: no   No notable events documented.   Last Vitals:  Vitals:   08/11/24 0340 08/11/24 0753  BP: 113/72 (!) 103/58  Pulse: (!) 59 91  Resp: 19 18  Temp: 36.6 C 36.6 C  SpO2: 97% 96%    Last Pain:  Vitals:   08/11/24 1540  TempSrc:   PainSc: 4                  Teigan Sahli Merilee Louder      "

## 2024-08-12 NOTE — Transitions of Care (Post Inpatient/ED Visit) (Signed)
" ° °  08/12/2024  Name: Colleen Washington MRN: [id number] DOB: 05-31-1961  Today's TOC FU Call Status: Today's TOC FU Call Status:: Unsuccessful Call (1st Attempt) Unsuccessful Call (1st Attempt) Date: 08/12/24  Attempted to reach the patient regarding the most recent Inpatient/ED visit.  Follow Up Plan: Additional outreach attempts will be made to reach the patient to complete the Transitions of Care (Post Inpatient/ED visit) call.   Medford Balboa, BSN, RN [place]  VBCI - Population Health RN Care Manager [phone]  "

## 2024-08-13 ENCOUNTER — Telehealth: Payer: Self-pay | Admitting: *Deleted

## 2024-08-13 ENCOUNTER — Encounter: Payer: Self-pay | Admitting: Podiatry

## 2024-08-13 NOTE — Transitions of Care (Post Inpatient/ED Visit) (Signed)
 "  08/13/2024  Name: Colleen Washington MRN: [id number] DOB: December 24, 1960  Today's TOC FU Call Status: Today's TOC FU Call Status:: Successful TOC FU Call Completed Unsuccessful Call (2nd Attempt) Date: 08/13/24 [facility] FU Call Complete Date: 08/13/24  Patient's Name and Date of Birth confirmed. Name, DOB  Transition Care Management Follow-up Telephone Call Date of Discharge: 08/11/24 Discharge Facility: [facility]) Type of Discharge: Inpatient Admission Primary Inpatient Discharge Diagnosis:: Closed Right Ankle Fracture How have you been since you were released from the hospital?: Same Any questions or concerns?: Yes Patient Questions/Concerns:: Patient concerned about driving and utilizing a boot. Patient Questions/Concerns Addressed: Provided Patient Educational Materials  Items Reviewed: Did you receive and understand the discharge instructions provided?: Yes Medications obtained,verified, and reconciled?: Yes (Medications Reviewed) Any new allergies since your discharge?: No Dietary orders reviewed?: Yes Type of Diet Ordered:: Cardiac and Carb Modified Diet Do you have support at home?: Yes People in Home [RPT]: child(ren), adult, spouse Name of Support/Comfort Primary Source: Wadie Seabrook (son)  Medications Reviewed Today: Medications Reviewed Today     Reviewed by Alvia Olam BIRCH, RN (Registered Nurse) on 08/13/24 at 1442  Med List Status: <None>   Medication Order Taking? Sig Documenting Provider Last Dose Status Informant  albuterol  (VENTOLIN  HFA) 108 (90 Base) MCG/ACT inhaler [id number] Yes Inhale 1 puff into the lungs every 6 (six) hours as needed for wheezing or shortness of breath. Dineen Rollene MATSU, FNP  Active Self  amoxicillin -clavulanate (AUGMENTIN ) 875-125 MG tablet [id number] Yes Take 1 tablet by mouth 2 (two) times daily for 7 days. Dorinda Drue DASEN, MD  Active   aspirin  EC 81 MG tablet [id number] Yes Take 1 tablet (81 mg total) by mouth daily.  Swallow whole. End, Lonni, MD  Active Self  aspirin  EC 81 MG tablet [id number] Yes Take 1 tablet (81 mg total) by mouth daily. Swallow whole. Dorinda Drue DASEN, MD  Active   buPROPion  (WELLBUTRIN  XL) 150 MG 24 hr tablet [id number] Yes TAKE 1 TABLET BY MOUTH EVERY DAY Marylynn Verneita CROME, MD  Active Self  cetirizine  (ZYRTEC  ALLERGY) 10 MG tablet [id number] Yes Take 1 tablet (10 mg total) by mouth daily. Lavell Bari LABOR, FNP  Active Self  fluticasone  (FLONASE ) 50 MCG/ACT nasal spray [id number] Yes Place 2 sprays into both nostrils daily. Lavell Bari LABOR, FNP  Active Self  methocarbamol  (ROBAXIN ) 500 MG tablet [id number] Yes Take 1 tablet (500 mg total) by mouth every 8 (eight) hours as needed for muscle spasms. Dorinda Drue DASEN, MD  Active   ondansetron  (ZOFRAN ) 4 MG tablet [id number] Yes Take 1 tablet (4 mg total) by mouth every 8 (eight) hours as needed for nausea or vomiting. Jordis Laneta FALCON, MD  Active Self  oxyCODONE -acetaminophen  (PERCOCET/ROXICET) 5-325 MG tablet [id number] Yes Take 1 tablet by mouth every 4 (four) hours as needed for up to 3 days for severe pain (pain score 7-10) or moderate pain (pain score 4-6). Dorinda Drue DASEN, MD  Active   rosuvastatin  (CRESTOR ) 5 MG tablet [id number] Yes Take 1 tablet (5 mg total) by mouth daily. Dorinda Drue DASEN, MD  Active   senna-docusate (SENOKOT-S) 8.6-50 MG tablet [id number] Yes Take 1 tablet by mouth at bedtime as needed for mild constipation. Dorinda Drue DASEN, MD  Active   SKYRIZI  PEN 150 MG/ML pen [id number] Yes ADMINISTER 1 INJECTION (150 MG/ML) UNDER THE SKIN EVERY 12 WEEKS AS DIRECTED FOR MAINTENANCE Jackquline Sawyer, MD  Active Self  Vitamin D -Vitamin K (VITAMIN K2-VITAMIN D3  PO) [id number] Yes Take 1 tablet by mouth daily. [provider]  Active Self            Home Care and Equipment/Supplies: Were Home Health Services Ordered?: No Any new equipment or medical supplies ordered?: NA (The recommended DME declined. Patient reports she already has a  rolling walker and she rented the Knee scooter. Patient also indicates she has a shower chair with back.) Were you able to get the equipment/medical supplies?: Yes (4 wheeled rolling walker with seat and BS commode (patient reports she declined)) Do you have any questions related to the use of the equipment/supplies?: No  Functional Questionnaire: Do you need assistance with bathing/showering or dressing?: No Do you need assistance with meal preparation?: Yes (Family assist with this task) Do you need assistance with eating?: No Do you have difficulty maintaining continence: No Do you need assistance with getting out of bed/getting out of a chair/moving?: No Do you have difficulty managing or taking your medications?: No  Follow up appointments reviewed: PCP Follow-up appointment confirmed?: No (Patient indicates she will call and work around your family's scheduled. Patient will call her primary care provider today to schedule.) MD Provider Line Number:[phone] Given: Yes Specialist Hospital Follow-up appointment confirmed?: Yes Date of Specialist follow-up appointment?: 08/20/24 Follow-Up Specialty Provider:: [facility] [phone]) Do you need transportation to your follow-up appointment?: No Do you understand care options if your condition(s) worsen?: Yes-patient verbalized understanding  SDOH Interventions Today    Flowsheet Row Most Recent Value  SDOH Interventions   Food Insecurity Interventions Intervention Not Indicated  Housing Interventions Intervention Not Indicated  Transportation Interventions Intervention Not Indicated  Utilities Interventions Intervention Not Indicated  Discussed and offered 30 day TOC program.  Patient verbally agrees to enroll into the [facility] 30 day program.  The patient has been provided with contact information for the care management team and has been advised to call with any health -related questions or concerns.  The patient  verbalized understanding with current plan of care.  The patient is directed to their insurance card regarding availability of benefits coverage.    Olam Ku, RN, BSN [place]  [facility], [facility] RN Care Manager Direct Dial: [phone]  Fax: [phone]    "

## 2024-08-13 NOTE — Patient Instructions (Signed)
 Visit Information  Thank you for taking time to visit with me today. Please don't hesitate to contact me if I can be of assistance to you before our next scheduled telephone appointment.  Our next appointment is by telephone on 08/19/2024 at 2:00 pm  Following is a copy of your care plan:   Goals Addressed             This Visit's Progress    VBCI Transitions of Care (TOC) Care Plan       Problems:  Recent Hospitalization for treatment of Closed Right Ankle Fracture Knowledge Deficit Related to Closed ankle fracture and No Hospital Follow Up Provider appointment with primary care provider patient reports she will contact her primary provider after this call and scheduled. RNCM offered to assist with scheduling this appointment (declined) indicating she will need to coordinator the appointment around her family member's schedule.   Goal:  Over the next 30 days, the patient will not experience hospital readmission Will verify patient has the contact numbers to both her podiatrist and primary care provider. Also provided the [facility] MD line [phone] if needed.  Interventions:  Transitions of Care: Durable Medical Equipment (DME) needs assessed with patient/caregiver, reviewed with patient/caregiver, education provided, and patient reports she declined the recommended DME from the hospital. States she has a rolling walker and shower chair. States she rented a knee scooter and does not need the recommended bedside commode. Doctor Visits  - discussed the importance of doctor visits: Advise patient to contact her primary care provider and request a hospital follow up appointment. Patient states she will call after this call today. Patients states she has an appointment for wound care with the podiatrist on 2/6. Advise to take pain medication as prescribed and avoid driving or alcohol. Advise to utilize the stool softeners as prescribed when needed Advise to elevate the affected leg when  resting and report any worsening symptoms of swelling to provider Advise to report any discoloration of numbness, burning,sever swelling or tightness, fever, foul odor or drainage from the incisional site. Advise patient to report inability to move toes and any discoloration of blue/pale or cold toes immediately to her provider.  Patient Self Care Activities:  Attend all scheduled provider appointments Call pharmacy for medication refills 3-7 days in advance of running out of medications Call provider office for new concerns or questions  Notify RN Care Manager of TOC call rescheduling needs Participate in Transition of Care Program/Attend TOC scheduled calls Perform all self care activities independently  Perform IADL's (shopping, preparing meals, housekeeping, managing finances) independently Take medications as prescribed   Perform assessment to her right lower extremity daily for any new changes or issues that are encountered.   Plan:  Telephone follow up appointment with care management team member scheduled for:  08/19/2024 @ 2:00 PM The patient has been provided with contact information for the care management team and has been advised to call with any health related questions or concerns.         Care plan and visit instructions communicated with the patient verbally today. Patient agrees to receive a copy in MyChart. Active MyChart status and patient understanding of how to access instructions and care plan via MyChart confirmed with patient.     Telephone follow up appointment with care management team member scheduled for: The patient has been provided with contact information for the care management team and has been advised to call with any health related questions or concerns.  Please call the care guide team at [phone] if you need to cancel or reschedule your appointment.   Please call the Suicide and Crisis Lifeline: 988 call the [place]  National Suicide Prevention  Lifeline: 4106550499 or TTY: 959-843-8962 TTY 5077773327) to talk to a trained counselor call 1-800-273-TALK (toll free, 24 hour hotline) if you are experiencing a Mental Health or Behavioral Health Crisis or need someone to talk to.  Olam Ku, RN, BSN [place]  [facility], [facility] RN Care Manager Direct Dial: [phone]  Fax: [phone]

## 2024-08-16 ENCOUNTER — Encounter: Payer: Self-pay | Admitting: Internal Medicine

## 2024-08-16 ENCOUNTER — Encounter: Payer: Self-pay | Admitting: Podiatry

## 2024-08-16 ENCOUNTER — Ambulatory Visit: Payer: Self-pay | Admitting: Internal Medicine

## 2024-08-16 NOTE — Telephone Encounter (Addendum)
 Patient was also advised that Urgent Care was an option but she states she doesn't want to go anywhere at this time due to recovering from ankle surgery.  FYI Only or Action Required?: Action required by provider: request for appointment, clinical question for provider, update on patient condition, and patient requesting phenergan  due to zofran  not helping.  Patient was last seen in primary care on 04/09/2024 by Randeen Laine LABOR, MD.  Called Nurse Triage reporting Nausea.  Symptoms began yesterday.  Interventions attempted: Prescription medications: zofran  and Rest, hydration, or home remedies.  Symptoms are: unchanged.  Triage Disposition: Call PCP Within 24 Hours  Patient/caregiver understands and will follow disposition?: Yes    Message from [facility] sent at 08/16/2024  2:46 PM EST  Reason for Triage: Broken ankle in 3 places and Headache, nausea, unable to eat due to taking these medication amoxicillin -clavulanate (AUGMENTIN ) 875-125 MG tablet, aspirin  EC 81 MG tablet   Reason for Disposition  Taking prescription medication that could cause nausea (e.g., narcotics/opiates, antibiotics, OCPs, many others)  Answer Assessment - Initial Assessment Questions Patient states she just got out of the hospital on 08/11/2024 ---she had ankle surgery  Patient states that she fell last week on ice---she states she broke her ankle in three places. She was given antibiotics, aspirin , and oxycodone ---she states she has not taken the oxycodone  and she just has been taking Tylenol  for pain.  Patient states Zofran  is not working at this time and she is having continued nausea. She states that the nausea started yesterday She states nausea is something that she has had in the past & that she deals with and she used to take phenergan  but they changed it to Zofran  at one point. She states she doesn't think that the Zofran  helps much. She also isnt able to eat much with the antibiotic due to  nausea. She wanted to see about getting prescribed phenergan  now She states that she would also like to have a virtual visit if possible because she doesn't want to have to go anywhere due to being home after ankle surgery.  She states she has been eating some---yogurt, peaches She feels like she will dry heave at times because she hasn't really been eating a lot.  Patient denies chest pain, difficulty breathing, any known fevers, any history of blood clots that she is aware of  Patient has not been bearing weight yet, states the surgery area looks fine Patient states that she feels like she also has a headache and some pressure  She states she has also been sneezing Patient states headache is not bad at all just a slight nagging headache   She states that she would like to be contacted back about if she could be prescribed phenergan  instead of zofran  and if the provider wants to do a video--she asks if it could be a virtual visit.  Patient is advised to call us  back if anything changes or with any further questions/concerns. Patient is advised that if anything worsens to go to the Emergency Room or call 911. Patient verbalized understanding.  Protocols used: Nausea-A-AH

## 2024-08-17 ENCOUNTER — Other Ambulatory Visit: Payer: Self-pay | Admitting: Internal Medicine

## 2024-08-17 ENCOUNTER — Ambulatory Visit: Admission: RE | Admit: 2024-08-17 | Admitting: Gastroenterology

## 2024-08-17 ENCOUNTER — Encounter: Admission: RE | Payer: Self-pay | Source: Home / Self Care

## 2024-08-17 MED ORDER — PROMETHAZINE HCL 12.5 MG PO TABS: 12.5000 mg | ORAL_TABLET | Freq: Three times a day (TID) | ORAL | 0 refills | Status: DC | PRN

## 2024-08-17 NOTE — Telephone Encounter (Signed)
 Pt has been scheduled for virtual tomorrow. Unable to come out due to broken ankle.

## 2024-08-17 NOTE — Telephone Encounter (Signed)
 Pt is aware and gave a verbal understanding.

## 2024-08-18 ENCOUNTER — Telehealth: Admitting: Internal Medicine

## 2024-08-18 ENCOUNTER — Encounter: Payer: Self-pay | Admitting: Internal Medicine

## 2024-08-18 VITALS — Ht 67.5 in | Wt 196.0 lb

## 2024-08-18 DIAGNOSIS — S82852S Displaced trimalleolar fracture of left lower leg, sequela: Secondary | ICD-10-CM | POA: Insufficient documentation

## 2024-08-18 DIAGNOSIS — R110 Nausea: Secondary | ICD-10-CM | POA: Insufficient documentation

## 2024-08-18 DIAGNOSIS — Z13820 Encounter for screening for osteoporosis: Secondary | ICD-10-CM

## 2024-08-18 MED ORDER — OMEPRAZOLE 40 MG PO CPDR: 40.0000 mg | DELAYED_RELEASE_CAPSULE | Freq: Every day | ORAL | 3 refills | Status: AC

## 2024-08-18 MED ORDER — PROMETHAZINE HCL 12.5 MG PO TABS: 12.5000 mg | ORAL_TABLET | Freq: Three times a day (TID) | ORAL | 0 refills | Status: AC | PRN

## 2024-08-18 NOTE — Assessment & Plan Note (Signed)
 S/p ORIF on jan 28 by Tobie ([facility]).  Has not required use of opioids for pain management.  DEXA ordered

## 2024-08-18 NOTE — Telephone Encounter (Signed)
Pt was seen today by Dr. Derrel Nip.

## 2024-08-18 NOTE — Assessment & Plan Note (Addendum)
 Suspect gastritis given history of HH .  Increase omeprazole  to 40 mg daily,  if no improvement in one week.  Additional work will be needed

## 2024-08-18 NOTE — Patient Instructions (Signed)
 Your bone density test  been ordered.  Please call to make your appointment at [place]  [phone]  .  Double check that it will be covered by your insurance  Increase omeprazole  to 40 mg once daily ON AN EMPTY STOMACH.  If nausea does  not resolve in a week,  let me know

## 2024-08-18 NOTE — Progress Notes (Signed)
 Virtual Visit via Caregility   Note   This format is felt to be most appropriate for this patient at this time.  All issues noted in this document were discussed and addressed.  No physical exam was performed (except for noted visual exam findings with Video Visits).   I connected with Colleen Washington  on 08/18/24 at  1:00 PM EST by a video enabled telemedicine application or telephone and verified that I am speaking with the correct person using two identifiers. Location patient: home Location provider: work or home office Persons participating in the virtual visit: patient, provider  I discussed the limitations, risks, security and privacy concerns of performing an evaluation and management service by telephone and the availability of in person appointments. I also discussed with the patient that there may be a patient responsible charge related to this service. The patient expressed understanding and agreed to proceed.   Reason for visit: nausea , ankle pain   HPI:  64 yr old female with history of Nissen fundoplication and parasophageal hernia repair April 2025  presents for eval and treatment of nausea which started several days after she inderwent  right trimalleolar fracture repair on Jan 26 . Nausea started several days after the surgery ,  accompanied by anorexia,  and sinus migraine   has not been eating uch.  Denies abd pain , constipation and change in stool color.   Had taken a break from omeprazole  post surgery  as directed,   has been taking otc 20 mg dose daily for the past for four days.  Not constipated,  has not been using oxycodone   sicne discharge.  Only tylenol       ROS: See pertinent positives and negatives per HPI.  Past Medical History:  Diagnosis Date   allergic rhinitis    Anxiety    AR (allergic rhinitis)    Asthma    Bronchitis, chronic (HCC)    Depression    Dyspnea on exertion 10/22/2019   Endometriosis    GERD (gastroesophageal reflux disease)    H/O  migraine    Herniated intervertebral disc    History of diverticulitis of colon    History of hiatal hernia    Hyperlipidemia    Hypertension    IBS (irritable bowel syndrome)    Irritable bowel syndrome    Menopause    Nonobstructive CAD (coronary artery disease)    a. 11/2019 Cor CTA: Cor Ca2+ = 141 (95th %'ile). LAD 25-49p.   Ovarian cyst, right 09/30/2015   PAC (premature atrial contraction) 10/22/2019   Polyp of transverse colon 05/22/2023   Psoriasis    SVT (supraventricular tachycardia) 08/15/2018   Thyroid  nodule 10/28/2016   Tobacco abuse    Vitamin D  deficiency 05/26/2015    Past Surgical History:  Procedure Laterality Date   ABDOMINAL HYSTERECTOMY  2003   [facility]   BREAST BIOPSY Left 08/2014   benign-BENIGN BREAST TISSUE WITH FIBROSIS   COLONOSCOPY WITH PROPOFOL  N/A 05/22/2023   Procedure: COLONOSCOPY WITH PROPOFOL ;  Surgeon: Jinny Carmine, MD;  Location: [facility] ENDOSCOPY;  Service: Endoscopy;  Laterality: N/A;   DILATION AND CURETTAGE OF UTERUS     ESOPHAGOGASTRODUODENOSCOPY (EGD) WITH PROPOFOL  N/A 05/22/2023   Procedure: ESOPHAGOGASTRODUODENOSCOPY (EGD) WITH PROPOFOL ;  Surgeon: Jinny Carmine, MD;  Location: [facility] ENDOSCOPY;  Service: Endoscopy;  Laterality: N/A;   ORIF ANKLE FRACTURE Right 08/10/2024   Procedure: OPEN REDUCTION INTERNAL FIXATION (ORIF) ANKLE FRACTURE;  Surgeon: Lennie Barter, DPM;  Location: [facility] ORS;  Service: Orthopedics/Podiatry;  Laterality: Right;  POLYPECTOMY  05/22/2023   Procedure: POLYPECTOMY;  Surgeon: Jinny Carmine, MD;  Location: [facility] ENDOSCOPY;  Service: Endoscopy;;   XI ROBOTIC ASSISTED PARAESOPHAGEAL HERNIA REPAIR N/A 10/21/2023   Procedure: REPAIR, HERNIA, PARAESOPHAGEAL, ROBOT-ASSISTED;  Surgeon: Jordis Laneta FALCON, MD;  Location: [facility] ORS;  Service: General;  Laterality: N/A;    Family History  Problem Relation Age of Onset   Arthritis Mother    Hyperlipidemia Mother    Cancer Father        colon   Diabetes Brother    Breast cancer Paternal  Aunt 58   Arthritis Maternal Grandmother    Hyperlipidemia Maternal Grandfather    Heart disease Neg Hx    Colon cancer Neg Hx     SOCIAL HX:  reports that she has been smoking cigarettes. She started smoking about 22 years ago. She has a 5 pack-year smoking history. She has been exposed to tobacco smoke. She has never used smokeless tobacco. She reports current alcohol use. She reports that she does not use drugs.   Current Medications[1]  EXAM:  VITALS per patient if applicable:  GENERAL: alert, oriented, appears well and in no acute distress  HEENT: atraumatic, conjunttiva clear, no obvious abnormalities on inspection of external nose and ears  NECK: normal movements of the head and neck  LUNGS: on inspection no signs of respiratory distress, breathing rate appears normal, no obvious gross SOB, gasping or wheezing  CV: no obvious cyanosis  MS: moves all visible extremities without noticeable abnormality  PSYCH/NEURO: pleasant and cooperative, no obvious depression or anxiety, speech and thought processing grossly intact  ASSESSMENT AND PLAN: Osteoporosis screening  Trimalleolar fracture of ankle, closed, left, sequela Assessment & Plan: S/p ORIF on jan 28 by Tobie ([facility]).  Has not required use of opioids for pain management.  DEXA ordered   Orders: -     DG Bone Density; Future  Nausea Assessment & Plan: Suspect gastritis given history of HH .  Increase omeprazole  to 40 mg daily,  if no improvement in one week.  Additional work will be needed   Other orders -     Promethazine  HCl; Take 1 tablet (12.5 mg total) by mouth every 8 (eight) hours as needed for nausea or vomiting.  Dispense: 20 tablet; Refill: 0 -     Omeprazole ; Take 1 capsule (40 mg total) by mouth daily.  Dispense: 30 capsule; Refill: 3      I discussed the assessment and treatment plan with the patient. The patient was provided an opportunity to ask questions and all were answered. The patient  agreed with the plan and demonstrated an understanding of the instructions.   The patient was advised to call back or seek an in-person evaluation if the symptoms worsen or if the condition fails to improve as anticipated.   I spent 20 minutes dedicated to the care of this patient on the date of this encounter to include pre-visit review of patient's medical history,  including recent ER visit,  recent surgery , imaging studies and labs, face-to-face time with the patient , and post visit ordering of testing and therapeutics.    Verneita LITTIE Kettering, MD      [1]  Current Outpatient Medications:    albuterol  (VENTOLIN  HFA) 108 (90 Base) MCG/ACT inhaler, Inhale 1 puff into the lungs every 6 (six) hours as needed for wheezing or shortness of breath., Disp: 18 each, Rfl: 2   amoxicillin -clavulanate (AUGMENTIN ) 875-125 MG tablet, Take 1 tablet by mouth 2 (  two) times daily for 7 days., Disp: 14 tablet, Rfl: 0   aspirin  EC 81 MG tablet, Take 1 tablet (81 mg total) by mouth daily. Swallow whole., Disp: , Rfl:    buPROPion  (WELLBUTRIN  XL) 150 MG 24 hr tablet, TAKE 1 TABLET BY MOUTH EVERY DAY, Disp: 90 tablet, Rfl: 1   cetirizine  (ZYRTEC  ALLERGY) 10 MG tablet, Take 1 tablet (10 mg total) by mouth daily., Disp: 90 tablet, Rfl: 1   fluticasone  (FLONASE ) 50 MCG/ACT nasal spray, Place 2 sprays into both nostrils daily., Disp: 16 g, Rfl: 6   methocarbamol  (ROBAXIN ) 500 MG tablet, Take 1 tablet (500 mg total) by mouth every 8 (eight) hours as needed for muscle spasms., Disp: 20 tablet, Rfl: 0   omeprazole  (PRILOSEC) 40 MG capsule, Take 1 capsule (40 mg total) by mouth daily., Disp: 30 capsule, Rfl: 3   rosuvastatin  (CRESTOR ) 5 MG tablet, Take 1 tablet (5 mg total) by mouth daily., Disp: 90 tablet, Rfl: 3   senna-docusate (SENOKOT-S) 8.6-50 MG tablet, Take 1 tablet by mouth at bedtime as needed for mild constipation., Disp: 30 tablet, Rfl: 0   SKYRIZI  PEN 150 MG/ML pen, ADMINISTER 1 INJECTION (150 MG/ML) UNDER THE  SKIN EVERY 12 WEEKS AS DIRECTED FOR MAINTENANCE, Disp: 1 mL, Rfl: 2   Vitamin D -Vitamin K (VITAMIN K2-VITAMIN D3 PO), Take 1 tablet by mouth daily., Disp: , Rfl:    aspirin  EC 81 MG tablet, Take 1 tablet (81 mg total) by mouth daily. Swallow whole., Disp: 40 tablet, Rfl: 0   ondansetron  (ZOFRAN ) 4 MG tablet, Take 1 tablet (4 mg total) by mouth every 8 (eight) hours as needed for nausea or vomiting. (Patient not taking: Reported on 08/18/2024), Disp: 30 tablet, Rfl: 1   promethazine  (PHENERGAN ) 12.5 MG tablet, Take 1 tablet (12.5 mg total) by mouth every 8 (eight) hours as needed for nausea or vomiting., Disp: 20 tablet, Rfl: 0

## 2024-08-19 ENCOUNTER — Telehealth: Payer: Self-pay | Admitting: *Deleted

## 2024-08-19 ENCOUNTER — Other Ambulatory Visit: Payer: Self-pay

## 2024-08-19 NOTE — Transitions of Care (Post Inpatient/ED Visit) (Signed)
 " Transition of Care week 2  Visit Note  08/19/2024  Name: Colleen Washington MRN: [id number]          DOB: March 04, 1961  Situation: Patient enrolled in [facility] 30-day program. Visit completed with patient by telephone.   Background:   Initial Transition Care Management Follow-up Telephone Call Discharge Date and Diagnosis: 08/11/24, Closed Right Ankle Fracture   Past Medical History:  Diagnosis Date   allergic rhinitis    Anxiety    AR (allergic rhinitis)    Asthma    Bronchitis, chronic (HCC)    Depression    Dyspnea on exertion 10/22/2019   Endometriosis    GERD (gastroesophageal reflux disease)    H/O migraine    Herniated intervertebral disc    History of diverticulitis of colon    History of hiatal hernia    Hyperlipidemia    Hypertension    IBS (irritable bowel syndrome)    Irritable bowel syndrome    Menopause    Nonobstructive CAD (coronary artery disease)    a. 11/2019 Cor CTA: Cor Ca2+ = 141 (95th %'ile). LAD 25-49p.   Ovarian cyst, right 09/30/2015   PAC (premature atrial contraction) 10/22/2019   Polyp of transverse colon 05/22/2023   Psoriasis    SVT (supraventricular tachycardia) 08/15/2018   Thyroid  nodule 10/28/2016   Tobacco abuse    Vitamin D  deficiency 05/26/2015    Assessment: Patient Reported Symptoms: Cognitive Cognitive Status: Able to follow simple commands, Alert and oriented to person, place, and time, Insightful and able to interpret abstract concepts, Normal speech and language skills   Health Facilitated by: Pain control, Rest  Neurological Neurological Review of Symptoms: No symptoms reported    HEENT HEENT Symptoms Reported: No symptoms reported HEENT Management Strategies: Medication therapy    Cardiovascular Cardiovascular Symptoms Reported: No symptoms reported Does patient have uncontrolled Hypertension?: No Cardiovascular Management Strategies: Routine screening Weight: 196 lb (88.9 kg)  Respiratory Respiratory Symptoms Reported: No  symptoms reported    Endocrine Endocrine Symptoms Reported: No symptoms reported Is patient diabetic?: No    Gastrointestinal Gastrointestinal Symptoms Reported: No symptoms reported Additional Gastrointestinal Details: Patient reports she has good bowel movements.. Patient also has Senna tablets if needed Gastrointestinal Management Strategies: Medication therapy    Genitourinary Genitourinary Symptoms Reported: No symptoms reported Additional Genitourinary Details: Will follow up with her podiatrist on 08/20/24. Educated on cranberry juice and greek yorgut    Integumentary Integumentary Symptoms Reported: Wound Additional Integumentary Details: Right ankle fracture with intact, clean and dry dressing. Denies any infection reported. Skin Management Strategies: Routine screening  Musculoskeletal Musculoskelatal Symptoms Reviewed: Difficulty walking, Limited mobility Additional Musculoskeletal Details: Patient continues using a knee scooter and has a rolling walker if needed. Musculoskeletal Management Strategies: Medical device, Routine screening Musculoskeletal Self-Management Outcome: 4 (good)      Psychosocial Psychosocial Symptoms Reported: No symptoms reported         Today's Vitals   08/19/24 1419  Weight: 196 lb (88.9 kg)   Pain Scale: 0-10 Pain Score: 0-No pain  Medications Reviewed Today     Reviewed by Alvia Olam BIRCH, RN (Registered Nurse) on 08/19/24 at 1418  Med List Status: <None>   Medication Order Taking? Sig Documenting Provider Last Dose Status Informant  albuterol  (VENTOLIN  HFA) 108 (90 Base) MCG/ACT inhaler [id number] Yes Inhale 1 puff into the lungs every 6 (six) hours as needed for wheezing or shortness of breath. Dineen Rollene MATSU, FNP  Active Self  aspirin  EC 81 MG tablet [id number]  Take 1 tablet (81 mg total) by mouth daily. Swallow whole.  Patient not taking: Reported on 08/19/2024   Mady Bruckner, MD  Active Self  aspirin  EC 81 MG tablet [id number]  Yes Take 1 tablet (81 mg total) by mouth daily. Swallow whole. Dorinda Drue DASEN, MD  Active   buPROPion  (WELLBUTRIN  XL) 150 MG 24 hr tablet [id number] Yes TAKE 1 TABLET BY MOUTH EVERY DAY Marylynn Verneita CROME, MD  Active Self  cetirizine  (ZYRTEC  ALLERGY) 10 MG tablet [id number] Yes Take 1 tablet (10 mg total) by mouth daily. Lavell Lye A, FNP  Active Self  fluconazole  (DIFLUCAN ) 150 MG tablet [id number] Yes Take by mouth. [provider]  Active   fluticasone  (FLONASE ) 50 MCG/ACT nasal spray [id number] Yes Place 2 sprays into both nostrils daily. Lavell Lye A, FNP  Active Self  methocarbamol  (ROBAXIN ) 500 MG tablet [id number] Yes Take 1 tablet (500 mg total) by mouth every 8 (eight) hours as needed for muscle spasms. Dorinda Drue DASEN, MD  Active   omeprazole  [facility]) 40 MG capsule [id number] Yes Take 1 capsule (40 mg total) by mouth daily. Marylynn Verneita CROME, MD  Active   ondansetron  (ZOFRAN ) 4 MG tablet [id number]  Take 1 tablet (4 mg total) by mouth every 8 (eight) hours as needed for nausea or vomiting.  Patient not taking: Reported on 08/18/2024   Jordis Cliche F, MD  Active Self  promethazine  (PHENERGAN ) 12.5 MG tablet [id number] Yes Take 1 tablet (12.5 mg total) by mouth every 8 (eight) hours as needed for nausea or vomiting. Marylynn Verneita CROME, MD  Active   rosuvastatin  (CRESTOR ) 5 MG tablet [id number] Yes Take 1 tablet (5 mg total) by mouth daily. Dorinda Drue DASEN, MD  Active   senna-docusate (SENOKOT-S) 8.6-50 MG tablet [id number] Yes Take 1 tablet by mouth at bedtime as needed for mild constipation. Dorinda Drue DASEN, MD  Active   SKYRIZI  PEN 150 MG/ML pen [id number] Yes ADMINISTER 1 INJECTION (150 MG/ML) UNDER THE SKIN EVERY 12 WEEKS AS DIRECTED FOR MAINTENANCE Jackquline Sawyer, MD  Active Self  Vitamin D -Vitamin K (VITAMIN K2-VITAMIN D3 PO) [id number] Yes Take 1 tablet by mouth daily. [provider]  Active Self            Recommendation:   PCP Follow-up Continue Current Plan of  Care  Follow Up Plan:   Telephone follow-up in 1 week   Olam Ku, RN, BSN, MSN [facility], [facility] RN Care Manager Direct Dial: [phone]  Fax: [phone]      "

## 2024-08-19 NOTE — Patient Instructions (Signed)
 Visit Information  Thank you for taking time to visit with me today. Please don't hesitate to contact me if I can be of assistance to you before our next scheduled telephone appointment.  Our next appointment is by telephone on 08/25/2024 at 1130 am  Following is a copy of your care plan:   Goals Addressed             This Visit's Progress    VBCI Transitions of Care (TOC) Care Plan       Problems:  Recent Hospitalization for treatment of Closed Right Ankle Fracture Knowledge Deficit Related to Closed ankle fracture and No Hospital Follow Up Provider appointment with primary care providerPatient confirmed a e-visit with her primary care provider on yesterday-not feeling well interventions provided and hospital follow up scheduled for 2/6 in-person. Patients states she has an appointment for wound care with the podiatrist on 2/6.  Goal:  Over the next 30 days, the patient will not experience hospital readmission Will verify pt is feeling better with interventions provided yesterday from her primary care provider's office  Interventions:  Transitions of Care: Doctor Visits  - discussed the importance of doctor visits:  Advise to take pain medication as prescribed and avoid driving or alcohol. Advise to utilize the stool softeners as prescribed when needed for constipation Advise to continue to elevate the affected leg when resting and report any worsening symptoms of swelling to provider Advise to report any discoloration of numbness, burning,sever swelling or tightness, fever, foul odor or drainage from the incisional site. Advise patient to report inability to move toes and any discoloration of blue/pale or cold toes immediately to her provider.  Patient Self Care Activities:  Attend all scheduled provider appointments Call pharmacy for medication refills 3-7 days in advance of running out of medications Call provider office for new concerns or questions  Notify RN Care Manager of  TOC call rescheduling needs Perform all self care activities independently  Perform IADL's (shopping, preparing meals, housekeeping, managing finances) independently Take medications as prescribed   Perform assessment to her right lower extremity daily for any new changes or issues that are encountered.   Plan:  Telephone follow up appointment with care management team member scheduled for:  08/25/2024 @ 1130 am The patient has been provided with contact information for the care management team and has been advised to call with any health related questions or concerns.         Care plan and visit instructions communicated with the patient verbally today. Patient agrees to receive a copy in MyChart. Active MyChart status and patient understanding of how to access instructions and care plan via MyChart confirmed with patient.     Telephone follow up appointment with care management team member scheduled for:  Please call the care guide team at [phone] if you need to cancel or reschedule your appointment.   Please call the Suicide and Crisis Lifeline: 988 call the [place]  National Suicide Prevention Lifeline: 309-887-9460 or TTY: (713)474-5673 TTY 671-196-0108) to talk to a trained counselor call 1-800-273-TALK (toll free, 24 hour hotline) if you are experiencing a Mental Health or Behavioral Health Crisis or need someone to talk to.   Olam Ku, RN, BSN, MSN [facility], [facility] RN Care Manager Direct Dial: [phone]  Fax: [phone]

## 2024-08-20 ENCOUNTER — Inpatient Hospital Stay: Admitting: Nurse Practitioner

## 2024-08-23 ENCOUNTER — Ambulatory Visit: Admitting: Family Medicine

## 2025-02-01 ENCOUNTER — Ambulatory Visit: Admitting: Dermatology
